# Patient Record
Sex: Female | Born: 1940 | Race: White | Hispanic: No | Marital: Married | Smoking: Never smoker
Health system: Southern US, Community
[De-identification: ages and names within clinical notes are randomized; demographics above are authoritative.]

## PROBLEM LIST (undated history)

## (undated) DIAGNOSIS — E785 Hyperlipidemia, unspecified: Secondary | ICD-10-CM

## (undated) DIAGNOSIS — E7800 Pure hypercholesterolemia, unspecified: Secondary | ICD-10-CM

## (undated) DIAGNOSIS — I5022 Chronic systolic (congestive) heart failure: Secondary | ICD-10-CM

## (undated) DIAGNOSIS — I255 Ischemic cardiomyopathy: Secondary | ICD-10-CM

## (undated) DIAGNOSIS — J45909 Unspecified asthma, uncomplicated: Secondary | ICD-10-CM

## (undated) DIAGNOSIS — E119 Type 2 diabetes mellitus without complications: Secondary | ICD-10-CM

## (undated) DIAGNOSIS — I509 Heart failure, unspecified: Secondary | ICD-10-CM

## (undated) DIAGNOSIS — M1990 Unspecified osteoarthritis, unspecified site: Secondary | ICD-10-CM

## (undated) DIAGNOSIS — N1832 Chronic kidney disease, stage 3b: Secondary | ICD-10-CM

## (undated) DIAGNOSIS — I517 Cardiomegaly: Secondary | ICD-10-CM

## (undated) DIAGNOSIS — I213 ST elevation (STEMI) myocardial infarction of unspecified site: Secondary | ICD-10-CM

## (undated) HISTORY — PX: ABDOMINAL HYSTERECTOMY: SHX81

---

## 2000-08-03 ENCOUNTER — Emergency Department (HOSPITAL_COMMUNITY): Admission: EM | Admit: 2000-08-03 | Discharge: 2000-08-03 | Payer: Self-pay | Admitting: *Deleted

## 2000-08-03 ENCOUNTER — Encounter: Payer: Self-pay | Admitting: *Deleted

## 2002-05-25 ENCOUNTER — Ambulatory Visit (HOSPITAL_COMMUNITY): Admission: RE | Admit: 2002-05-25 | Discharge: 2002-05-25 | Payer: Self-pay | Admitting: Pulmonary Disease

## 2006-07-25 ENCOUNTER — Ambulatory Visit (HOSPITAL_COMMUNITY): Admission: RE | Admit: 2006-07-25 | Discharge: 2006-07-25 | Payer: Self-pay | Admitting: Family Medicine

## 2008-02-02 ENCOUNTER — Ambulatory Visit (HOSPITAL_COMMUNITY): Admission: RE | Admit: 2008-02-02 | Discharge: 2008-02-02 | Payer: Self-pay | Admitting: Family Medicine

## 2008-04-04 ENCOUNTER — Emergency Department (HOSPITAL_COMMUNITY): Admission: EM | Admit: 2008-04-04 | Discharge: 2008-04-04 | Payer: Self-pay | Admitting: Emergency Medicine

## 2010-04-09 ENCOUNTER — Encounter: Payer: Self-pay | Admitting: Family Medicine

## 2013-08-04 ENCOUNTER — Other Ambulatory Visit (HOSPITAL_COMMUNITY): Payer: Self-pay | Admitting: Family Medicine

## 2013-08-04 ENCOUNTER — Ambulatory Visit (HOSPITAL_COMMUNITY)
Admission: RE | Admit: 2013-08-04 | Discharge: 2013-08-04 | Disposition: A | Discharge status: Home / Self Care | Payer: Medicare Other | Source: Ambulatory Visit | Attending: Family Medicine | Admitting: Family Medicine

## 2013-08-04 DIAGNOSIS — Z139 Encounter for screening, unspecified: Secondary | ICD-10-CM

## 2013-08-04 DIAGNOSIS — Z1231 Encounter for screening mammogram for malignant neoplasm of breast: Secondary | ICD-10-CM

## 2014-03-08 ENCOUNTER — Ambulatory Visit (HOSPITAL_COMMUNITY)
Admission: RE | Admit: 2014-03-08 | Discharge: 2014-03-08 | Disposition: A | Discharge status: Home / Self Care | Payer: Medicare Other | Source: Ambulatory Visit | Attending: Family Medicine | Admitting: Family Medicine

## 2014-03-08 ENCOUNTER — Other Ambulatory Visit (HOSPITAL_COMMUNITY): Payer: Self-pay | Admitting: Family Medicine

## 2014-03-08 DIAGNOSIS — J322 Chronic ethmoidal sinusitis: Secondary | ICD-10-CM | POA: Diagnosis not present

## 2014-03-08 DIAGNOSIS — R202 Paresthesia of skin: Principal | ICD-10-CM

## 2014-03-08 DIAGNOSIS — R930 Abnormal findings on diagnostic imaging of skull and head, not elsewhere classified: Secondary | ICD-10-CM | POA: Diagnosis not present

## 2014-03-08 DIAGNOSIS — R200 Anesthesia of skin: Secondary | ICD-10-CM

## 2016-04-02 ENCOUNTER — Ambulatory Visit (HOSPITAL_COMMUNITY)
Admission: RE | Admit: 2016-04-02 | Discharge: 2016-04-02 | Disposition: A | Discharge status: Home / Self Care | Payer: Medicare Other | Source: Ambulatory Visit | Attending: Family Medicine | Admitting: Family Medicine

## 2016-04-02 ENCOUNTER — Other Ambulatory Visit (HOSPITAL_COMMUNITY): Payer: Self-pay | Admitting: Family Medicine

## 2016-04-02 DIAGNOSIS — R52 Pain, unspecified: Secondary | ICD-10-CM

## 2016-04-02 DIAGNOSIS — M47816 Spondylosis without myelopathy or radiculopathy, lumbar region: Secondary | ICD-10-CM

## 2016-04-02 DIAGNOSIS — M4316 Spondylolisthesis, lumbar region: Secondary | ICD-10-CM | POA: Insufficient documentation

## 2016-04-02 DIAGNOSIS — M438X6 Other specified deforming dorsopathies, lumbar region: Secondary | ICD-10-CM | POA: Insufficient documentation

## 2016-04-02 DIAGNOSIS — M5136 Other intervertebral disc degeneration, lumbar region: Secondary | ICD-10-CM | POA: Insufficient documentation

## 2016-04-02 DIAGNOSIS — M545 Low back pain: Secondary | ICD-10-CM | POA: Diagnosis present

## 2018-05-27 ENCOUNTER — Encounter (HOSPITAL_COMMUNITY): Payer: Self-pay

## 2018-05-27 ENCOUNTER — Emergency Department (HOSPITAL_COMMUNITY): Payer: Medicare Other

## 2018-05-27 ENCOUNTER — Other Ambulatory Visit: Payer: Self-pay

## 2018-05-27 ENCOUNTER — Emergency Department (HOSPITAL_COMMUNITY)
Admission: EM | Admit: 2018-05-27 | Discharge: 2018-05-27 | Disposition: A | Discharge status: Home / Self Care | Payer: Medicare Other | Attending: Emergency Medicine | Admitting: Emergency Medicine

## 2018-05-27 DIAGNOSIS — R42 Dizziness and giddiness: Secondary | ICD-10-CM | POA: Insufficient documentation

## 2018-05-27 DIAGNOSIS — Z7984 Long term (current) use of oral hypoglycemic drugs: Secondary | ICD-10-CM | POA: Insufficient documentation

## 2018-05-27 DIAGNOSIS — J45909 Unspecified asthma, uncomplicated: Secondary | ICD-10-CM | POA: Insufficient documentation

## 2018-05-27 DIAGNOSIS — F17220 Nicotine dependence, chewing tobacco, uncomplicated: Secondary | ICD-10-CM | POA: Insufficient documentation

## 2018-05-27 DIAGNOSIS — E119 Type 2 diabetes mellitus without complications: Secondary | ICD-10-CM | POA: Insufficient documentation

## 2018-05-27 HISTORY — DX: Type 2 diabetes mellitus without complications: E11.9

## 2018-05-27 HISTORY — DX: Unspecified osteoarthritis, unspecified site: M19.90

## 2018-05-27 HISTORY — DX: Unspecified asthma, uncomplicated: J45.909

## 2018-05-27 HISTORY — DX: Pure hypercholesterolemia, unspecified: E78.00

## 2018-05-27 LAB — INFLUENZA PANEL BY PCR (TYPE A & B)
INFLAPCR: NEGATIVE
Influenza B By PCR: NEGATIVE

## 2018-05-27 LAB — CBC WITH DIFFERENTIAL/PLATELET
Abs Immature Granulocytes: 0.04 10*3/uL (ref 0.00–0.07)
Basophils Absolute: 0.1 10*3/uL (ref 0.0–0.1)
Basophils Relative: 1 %
EOS PCT: 1 %
Eosinophils Absolute: 0.1 10*3/uL (ref 0.0–0.5)
HEMATOCRIT: 43.5 % (ref 36.0–46.0)
HEMOGLOBIN: 14 g/dL (ref 12.0–15.0)
Immature Granulocytes: 1 %
LYMPHS PCT: 11 %
Lymphs Abs: 1 10*3/uL (ref 0.7–4.0)
MCH: 30.9 pg (ref 26.0–34.0)
MCHC: 32.2 g/dL (ref 30.0–36.0)
MCV: 96 fL (ref 80.0–100.0)
MONOS PCT: 5 %
Monocytes Absolute: 0.4 10*3/uL (ref 0.1–1.0)
Neutro Abs: 7.2 10*3/uL (ref 1.7–7.7)
Neutrophils Relative %: 81 %
Platelets: 214 10*3/uL (ref 150–400)
RBC: 4.53 MIL/uL (ref 3.87–5.11)
RDW: 12.6 % (ref 11.5–15.5)
WBC: 8.8 10*3/uL (ref 4.0–10.5)
nRBC: 0 % (ref 0.0–0.2)

## 2018-05-27 LAB — COMPREHENSIVE METABOLIC PANEL
ALK PHOS: 28 U/L — AB (ref 38–126)
ALT: 15 U/L (ref 0–44)
ANION GAP: 9 (ref 5–15)
AST: 18 U/L (ref 15–41)
Albumin: 3.9 g/dL (ref 3.5–5.0)
BILIRUBIN TOTAL: 0.8 mg/dL (ref 0.3–1.2)
BUN: 12 mg/dL (ref 8–23)
CO2: 24 mmol/L (ref 22–32)
Calcium: 9.2 mg/dL (ref 8.9–10.3)
Chloride: 104 mmol/L (ref 98–111)
Creatinine, Ser: 0.96 mg/dL (ref 0.44–1.00)
GFR calc non Af Amer: 57 mL/min — ABNORMAL LOW (ref >60)
Glucose, Bld: 123 mg/dL — ABNORMAL HIGH (ref 70–99)
Potassium: 4.1 mmol/L (ref 3.5–5.1)
SODIUM: 137 mmol/L (ref 135–145)
Total Protein: 7.2 g/dL (ref 6.5–8.1)

## 2018-05-27 LAB — TROPONIN I: Troponin I: <0.03 ng/mL (ref <0.03)

## 2018-05-27 LAB — D-DIMER, QUANTITATIVE (NOT AT ARMC): D DIMER QUANT: 0.64 ug{FEU}/mL — AB (ref 0.00–0.50)

## 2018-05-27 LAB — URINALYSIS, ROUTINE W REFLEX MICROSCOPIC
BILIRUBIN URINE: NEGATIVE
Glucose, UA: NEGATIVE mg/dL
Hgb urine dipstick: NEGATIVE
KETONES UR: NEGATIVE mg/dL
Leukocytes,Ua: NEGATIVE
Nitrite: NEGATIVE
PH: 5 (ref 5.0–8.0)
Protein, ur: NEGATIVE mg/dL
Specific Gravity, Urine: 1.021 (ref 1.005–1.030)

## 2018-05-27 LAB — CBG MONITORING, ED: Glucose-Capillary: 121 mg/dL — ABNORMAL HIGH (ref 70–99)

## 2018-05-27 MED ORDER — ONDANSETRON HCL 4 MG/2ML IJ SOLN: 4.0000 mg | Freq: Once | INTRAMUSCULAR | Status: DC

## 2018-05-27 MED ORDER — SODIUM CHLORIDE 0.9 % IV BOLUS (SEPSIS): 500.0000 mL | Freq: Once | INTRAVENOUS | Status: DC

## 2018-05-27 NOTE — Discharge Instructions (Addendum)
Drink plenty of fluids and rest and follow-up with your doctor this week for recheck

## 2018-05-27 NOTE — ED Triage Notes (Addendum)
EMS reports at 12noon today pt had sudden onset of numbness all over body, generalized weakness, and n/v.    Pt says she thought she was going to pass out and was too weak to get to a phone for approx 1 1/2 hours.   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  Daughter told ems "something is different around her mouth."  Daughter told ems pt was slurring her speech initially.  Denies any slurred speech now.  EDP at bedside.   No obvious facial droop.  Pt alert and oriented.  Pt had some cp after vomiting.  Denies any vomiting at this time.  Pt also has history of vertigo according to ems.  Pt alert, oriented, and pleasant.

## 2018-05-27 NOTE — ED Provider Notes (Signed)
[facility] EMERGENCY DEPARTMENT Provider Note   CSN: [id number] Arrival date & time: 05/27/18  1514    History   Chief Complaint No chief complaint on file.   HPI Terry Hanson is a 78 y.o. female.     Patient states that she felt dizzy and weak and had some heartburn.  Called her daughter and the daughter stated that she sound like she had slurred speech.  When she arrived in the emergency department she actually stated that she felt much better  The history is provided by the patient. No language interpreter was used.  Weakness  Severity:  Moderate Onset quality:  Sudden Timing:  Intermittent Progression:  Resolved Chronicity:  Recurrent Context: not alcohol use   Relieved by:  Nothing Worsened by:  Nothing Ineffective treatments:  None tried Associated symptoms: no abdominal pain, no chest pain, no cough, no diarrhea, no frequency, no headaches and no seizures     Past Medical History:  Diagnosis Date  . Arthritis   . Asthma   . Diabetes mellitus without complication (HCC)   . Hypercholesterolemia     There are no active problems to display for this patient.   Past Surgical History:  Procedure Laterality Date  . ABDOMINAL HYSTERECTOMY       OB History   No obstetric history on file.      Home Medications    Prior to Admission medications   Medication Sig Start Date End Date Taking? Authorizing Provider  metFORMIN (GLUCOPHAGE) 500 MG tablet Take 500 mg by mouth daily.   Yes [provider]  simvastatin (ZOCOR) 20 MG tablet Take 20 mg by mouth daily.   Yes [provider]    Family History No family history on file.  Social History Social History   Tobacco Use  . Smoking status: Never Smoker  . Smokeless tobacco: Current User    Types: Chew  Substance Use Topics  . Alcohol use: Never    Frequency: Never  . Drug use: Never     Allergies   Codeine; Penicillins; and Sulfa antibiotics   Review of Systems Review of  Systems  Constitutional: Negative for appetite change and fatigue.  HENT: Negative for congestion, ear discharge and sinus pressure.   Eyes: Negative for discharge.  Respiratory: Negative for cough.   Cardiovascular: Negative for chest pain.  Gastrointestinal: Negative for abdominal pain and diarrhea.  Genitourinary: Negative for frequency and hematuria.  Musculoskeletal: Negative for back pain.  Skin: Negative for rash.  Neurological: Positive for weakness. Negative for seizures and headaches.  Psychiatric/Behavioral: Negative for hallucinations.     Physical Exam Updated Vital Signs BP (!) 189/50 (BP Location: Left Arm)   Pulse (!) 58   Temp 98.2 F (36.8 C) (Oral)   Resp 20   Ht 5\' 2"  (1.575 m)   Wt 90.7 kg   SpO2 98%   BMI 36.58 kg/m   Physical Exam Vitals signs and nursing note reviewed.  Constitutional:      Appearance: She is well-developed.  HENT:     Head: Normocephalic.     Nose: Nose normal.  Eyes:     General: No scleral icterus.    Conjunctiva/sclera: Conjunctivae normal.  Neck:     Musculoskeletal: Neck supple.     Thyroid: No thyromegaly.  Cardiovascular:     Rate and Rhythm: Normal rate and regular rhythm.     Heart sounds: No murmur. No friction rub. No gallop.   Pulmonary:  Breath sounds: No stridor. No wheezing or rales.  Chest:     Chest wall: No tenderness.  Abdominal:     General: There is no distension.     Tenderness: There is no abdominal tenderness. There is no rebound.  Musculoskeletal: Normal range of motion.  Lymphadenopathy:     Cervical: No cervical adenopathy.  Skin:    Findings: No erythema or rash.  Neurological:     Mental Status: She is oriented to person, place, and time.     Motor: No abnormal muscle tone.     Coordination: Coordination normal.  Psychiatric:        Behavior: Behavior normal.      ED Treatments / Results  Labs (all labs ordered are listed, but only abnormal results are displayed) Labs  Reviewed  COMPREHENSIVE METABOLIC PANEL - Abnormal; Notable for the following components:      Result Value   Glucose, Bld 123 (*)    Alkaline Phosphatase 28 (*)    GFR calc non Af Amer 57 (*)    All other components within normal limits  D-DIMER, QUANTITATIVE (NOT AT [facility]) - Abnormal; Notable for the following components:   D-Dimer, Quant 0.64 (*)    All other components within normal limits  CBG MONITORING, ED - Abnormal; Notable for the following components:   Glucose-Capillary 121 (*)    All other components within normal limits  CBC WITH DIFFERENTIAL/PLATELET  TROPONIN I  INFLUENZA PANEL BY PCR (TYPE A & B)  URINALYSIS, ROUTINE W REFLEX MICROSCOPIC  TROPONIN I    EKG EKG Interpretation  Date/Time:  Tuesday May 27 2018 17:02:17 EDT Ventricular Rate:  54 PR Interval:    QRS Duration: 98 QT Interval:  449 QTC Calculation: 426 R Axis:   79 Text Interpretation:  Sinus rhythm Baseline wander in lead(s) V4 Confirmed by Bethann Berkshire [id number]) on 05/27/2018 6:50:06 PM   Radiology Dg Chest 2 View  Result Date: 05/27/2018 CLINICAL DATA:  Acute weakness. EXAM: CHEST - 2 VIEW COMPARISON:  None. FINDINGS: Cardiomegaly and elevated RIGHT hemidiaphragm noted. There is no evidence of focal airspace disease, pulmonary edema, suspicious pulmonary nodule/mass, pleural effusion, or pneumothorax. No acute bony abnormalities are identified. IMPRESSION: Cardiomegaly without acute abnormality. Elevated RIGHT hemidiaphragm-likely chronic. Electronically Signed   By: Harmon Pier M.D.   On: 05/27/2018 16:24   Ct Head Wo Contrast  Result Date: 05/27/2018 CLINICAL DATA:  78 year old female with sudden onset weakness and numbness at noon. EXAM: CT HEAD WITHOUT CONTRAST TECHNIQUE: Contiguous axial images were obtained from the base of the skull through the vertex without intravenous contrast. COMPARISON:  Head CT 03/08/2014. FINDINGS: Brain: Stable cerebral volume, normal for age. No midline shift,  ventriculomegaly, mass effect, evidence of mass lesion, intracranial hemorrhage or evidence of cortically based acute infarction. Gray-white matter differentiation is within normal limits throughout the brain. Vascular: Calcified atherosclerosis at the skull base. No suspicious intracranial vascular hyperdensity. Skull: Negative. Sinuses/Orbits: Clear aside from mild bubbly opacity in the left sphenoid today. Tympanic cavities and mastoids remain clear. Other: Negative orbit and scalp soft tissues. IMPRESSION: Stable and normal for age non contrast CT appearance of the brain. Electronically Signed   By: Odessa Fleming M.D.   On: 05/27/2018 18:44    Procedures Procedures (including critical care time)  Medications Ordered in ED Medications  ondansetron (ZOFRAN) injection 4 mg (4 mg Intravenous Not Given 05/27/18 1822)  sodium chloride 0.9 % bolus 500 mL (500 mLs Intravenous Refused 05/27/18 1821)  Initial Impression / Assessment and Plan / ED Course  I have reviewed the triage vital signs and the nursing notes.  Pertinent labs & imaging results that were available during my care of the patient were reviewed by me and considered in my medical decision making (see chart for details).        Dizziness and weakness resolved.  Labs and x-rays unremarkable.  Patient prefers to be discharged home.  Patient's weakness may have been related to mild dehydration she will follow-up with her PCP this week or return if problems  Final Clinical Impressions(s) / ED Diagnoses   Final diagnoses:  Dizziness    ED Discharge Orders    None       Bethann Berkshire, MD 05/27/18 2042

## 2019-06-29 ENCOUNTER — Inpatient Hospital Stay (HOSPITAL_COMMUNITY)
Admission: EM | Disposition: A | Discharge status: Home / Self Care | Payer: Self-pay | Source: Home / Self Care | Attending: Interventional Cardiology

## 2019-06-29 ENCOUNTER — Emergency Department (HOSPITAL_COMMUNITY): Admit: 2019-06-29 | Payer: Medicare Other | Admitting: Interventional Cardiology

## 2019-06-29 ENCOUNTER — Inpatient Hospital Stay (HOSPITAL_COMMUNITY): Payer: Medicare Other

## 2019-06-29 ENCOUNTER — Inpatient Hospital Stay (HOSPITAL_COMMUNITY)
Admission: EM | Admit: 2019-06-29 | Discharge: 2019-07-02 | DRG: 246 | Disposition: A | Discharge status: Home / Self Care | Payer: Medicare Other | Attending: Interventional Cardiology | Admitting: Interventional Cardiology

## 2019-06-29 ENCOUNTER — Encounter (HOSPITAL_COMMUNITY): Payer: Self-pay

## 2019-06-29 ENCOUNTER — Encounter (HOSPITAL_COMMUNITY): Payer: Self-pay | Admitting: Physician Assistant

## 2019-06-29 DIAGNOSIS — Z885 Allergy status to narcotic agent status: Secondary | ICD-10-CM | POA: Diagnosis not present

## 2019-06-29 DIAGNOSIS — Z955 Presence of coronary angioplasty implant and graft: Secondary | ICD-10-CM

## 2019-06-29 DIAGNOSIS — I2102 ST elevation (STEMI) myocardial infarction involving left anterior descending coronary artery: Secondary | ICD-10-CM | POA: Diagnosis present

## 2019-06-29 DIAGNOSIS — I5021 Acute systolic (congestive) heart failure: Secondary | ICD-10-CM | POA: Diagnosis present

## 2019-06-29 DIAGNOSIS — Z79899 Other long term (current) drug therapy: Secondary | ICD-10-CM | POA: Diagnosis not present

## 2019-06-29 DIAGNOSIS — I2510 Atherosclerotic heart disease of native coronary artery without angina pectoris: Secondary | ICD-10-CM

## 2019-06-29 DIAGNOSIS — E119 Type 2 diabetes mellitus without complications: Secondary | ICD-10-CM | POA: Diagnosis present

## 2019-06-29 DIAGNOSIS — E782 Mixed hyperlipidemia: Secondary | ICD-10-CM | POA: Diagnosis not present

## 2019-06-29 DIAGNOSIS — Z7984 Long term (current) use of oral hypoglycemic drugs: Secondary | ICD-10-CM

## 2019-06-29 DIAGNOSIS — Z20822 Contact with and (suspected) exposure to covid-19: Secondary | ICD-10-CM | POA: Diagnosis present

## 2019-06-29 DIAGNOSIS — I5023 Acute on chronic systolic (congestive) heart failure: Secondary | ICD-10-CM | POA: Diagnosis not present

## 2019-06-29 DIAGNOSIS — E785 Hyperlipidemia, unspecified: Secondary | ICD-10-CM | POA: Diagnosis present

## 2019-06-29 DIAGNOSIS — R079 Chest pain, unspecified: Secondary | ICD-10-CM | POA: Diagnosis present

## 2019-06-29 DIAGNOSIS — Z882 Allergy status to sulfonamides status: Secondary | ICD-10-CM | POA: Diagnosis not present

## 2019-06-29 DIAGNOSIS — Z8249 Family history of ischemic heart disease and other diseases of the circulatory system: Secondary | ICD-10-CM | POA: Diagnosis not present

## 2019-06-29 DIAGNOSIS — I213 ST elevation (STEMI) myocardial infarction of unspecified site: Secondary | ICD-10-CM

## 2019-06-29 DIAGNOSIS — R030 Elevated blood-pressure reading, without diagnosis of hypertension: Secondary | ICD-10-CM | POA: Diagnosis present

## 2019-06-29 DIAGNOSIS — E1169 Type 2 diabetes mellitus with other specified complication: Secondary | ICD-10-CM

## 2019-06-29 DIAGNOSIS — Z880 Allergy status to penicillin: Secondary | ICD-10-CM

## 2019-06-29 DIAGNOSIS — I10 Essential (primary) hypertension: Secondary | ICD-10-CM | POA: Diagnosis not present

## 2019-06-29 DIAGNOSIS — I472 Ventricular tachycardia: Secondary | ICD-10-CM | POA: Diagnosis not present

## 2019-06-29 DIAGNOSIS — I255 Ischemic cardiomyopathy: Secondary | ICD-10-CM | POA: Diagnosis present

## 2019-06-29 HISTORY — PX: CORONARY/GRAFT ACUTE MI REVASCULARIZATION: CATH118305

## 2019-06-29 HISTORY — PX: LEFT HEART CATH AND CORONARY ANGIOGRAPHY: CATH118249

## 2019-06-29 HISTORY — DX: Type 2 diabetes mellitus without complications: E11.9

## 2019-06-29 HISTORY — DX: Hyperlipidemia, unspecified: E78.5

## 2019-06-29 HISTORY — DX: Cardiomegaly: I51.7

## 2019-06-29 LAB — COMPREHENSIVE METABOLIC PANEL
ALT: 20 U/L (ref 0–44)
AST: 25 U/L (ref 15–41)
Albumin: 3.9 g/dL (ref 3.5–5.0)
Alkaline Phosphatase: 29 U/L — ABNORMAL LOW (ref 38–126)
Anion gap: 15 (ref 5–15)
BUN: 14 mg/dL (ref 8–23)
CO2: 24 mmol/L (ref 22–32)
Calcium: 9.9 mg/dL (ref 8.9–10.3)
Chloride: 100 mmol/L (ref 98–111)
Creatinine, Ser: 1.34 mg/dL — ABNORMAL HIGH (ref 0.44–1.00)
GFR calc Af Amer: 44 mL/min — ABNORMAL LOW (ref >60)
GFR calc non Af Amer: 38 mL/min — ABNORMAL LOW (ref >60)
Glucose, Bld: 264 mg/dL — ABNORMAL HIGH (ref 70–99)
Potassium: 3.8 mmol/L (ref 3.5–5.1)
Sodium: 139 mmol/L (ref 135–145)
Total Bilirubin: 0.7 mg/dL (ref 0.3–1.2)
Total Protein: 7.5 g/dL (ref 6.5–8.1)

## 2019-06-29 LAB — CBC WITH DIFFERENTIAL/PLATELET
Abs Immature Granulocytes: 0.09 10*3/uL — ABNORMAL HIGH (ref 0.00–0.07)
Basophils Absolute: 0.1 10*3/uL (ref 0.0–0.1)
Basophils Relative: 1 %
Eosinophils Absolute: 0.1 10*3/uL (ref 0.0–0.5)
Eosinophils Relative: 1 %
HCT: 44.6 % (ref 36.0–46.0)
Hemoglobin: 14.4 g/dL (ref 12.0–15.0)
Immature Granulocytes: 1 %
Lymphocytes Relative: 15 %
Lymphs Abs: 2.1 10*3/uL (ref 0.7–4.0)
MCH: 30.6 pg (ref 26.0–34.0)
MCHC: 32.3 g/dL (ref 30.0–36.0)
MCV: 94.7 fL (ref 80.0–100.0)
Monocytes Absolute: 0.7 10*3/uL (ref 0.1–1.0)
Monocytes Relative: 5 %
Neutro Abs: 11.2 10*3/uL — ABNORMAL HIGH (ref 1.7–7.7)
Neutrophils Relative %: 77 %
Platelets: 249 10*3/uL (ref 150–400)
RBC: 4.71 MIL/uL (ref 3.87–5.11)
RDW: 13 % (ref 11.5–15.5)
WBC: 14.3 10*3/uL — ABNORMAL HIGH (ref 4.0–10.5)
nRBC: 0 % (ref 0.0–0.2)

## 2019-06-29 LAB — RESPIRATORY PANEL BY RT PCR (FLU A&B, COVID)
Influenza A by PCR: NEGATIVE
Influenza B by PCR: NEGATIVE
SARS Coronavirus 2 by RT PCR: NEGATIVE

## 2019-06-29 LAB — LIPID PANEL
Cholesterol: 262 mg/dL — ABNORMAL HIGH (ref 0–200)
Cholesterol: 266 mg/dL — ABNORMAL HIGH (ref 0–200)
HDL: 44 mg/dL (ref >40)
HDL: 51 mg/dL (ref >40)
LDL Cholesterol: 184 mg/dL — ABNORMAL HIGH (ref 0–99)
LDL Cholesterol: 192 mg/dL — ABNORMAL HIGH (ref 0–99)
Total CHOL/HDL Ratio: 6 RATIO
Total CHOL/HDL Ratio: 5.2 RATIO
Triglycerides: 171 mg/dL — ABNORMAL HIGH (ref <150)
Triglycerides: 117 mg/dL (ref <150)
VLDL: 34 mg/dL (ref 0–40)
VLDL: 23 mg/dL (ref 0–40)

## 2019-06-29 LAB — TROPONIN I (HIGH SENSITIVITY)
Troponin I (High Sensitivity): 454 ng/L (ref <18)
Troponin I (High Sensitivity): >27000 ng/L (ref <18)

## 2019-06-29 LAB — PROTIME-INR
INR: 1 (ref 0.8–1.2)
Prothrombin Time: 12.9 seconds (ref 11.4–15.2)

## 2019-06-29 LAB — APTT: aPTT: 25 seconds (ref 24–36)

## 2019-06-29 LAB — TSH: TSH: 2.728 u[IU]/mL (ref 0.350–4.500)

## 2019-06-29 LAB — BRAIN NATRIURETIC PEPTIDE: B Natriuretic Peptide: 37.2 pg/mL (ref 0.0–100.0)

## 2019-06-29 SURGERY — CORONARY/GRAFT ACUTE MI REVASCULARIZATION
Anesthesia: LOCAL

## 2019-06-29 SURGERY — INVASIVE LAB ABORTED CASE

## 2019-06-29 MED ORDER — LABETALOL HCL 5 MG/ML IV SOLN: 10.0000 mg | INTRAVENOUS | Status: AC | PRN

## 2019-06-29 MED ORDER — FUROSEMIDE 10 MG/ML IJ SOLN
40.0000 mg | Freq: Once | INTRAMUSCULAR | Status: AC
  Administered 2019-06-29: 23:00:00 40 mg via INTRAVENOUS
  Filled 2019-06-29: qty 4

## 2019-06-29 MED ORDER — NITROGLYCERIN 1 MG/10 ML FOR IR/CATH LAB
INTRA_ARTERIAL | Status: DC | PRN
  Administered 2019-06-29: 100 ug

## 2019-06-29 MED ORDER — VERAPAMIL HCL 2.5 MG/ML IV SOLN
INTRAVENOUS | Status: DC | PRN
  Administered 2019-06-29 (×2): 100 ug via INTRACORONARY

## 2019-06-29 MED ORDER — HEPARIN (PORCINE) IN NACL 1000-0.9 UT/500ML-% IV SOLN
INTRAVENOUS | Status: AC
  Filled 2019-06-29: qty 1000

## 2019-06-29 MED ORDER — HYDRALAZINE HCL 20 MG/ML IJ SOLN: 10.0000 mg | INTRAMUSCULAR | Status: AC | PRN

## 2019-06-29 MED ORDER — FENTANYL CITRATE (PF) 100 MCG/2ML IJ SOLN
INTRAMUSCULAR | Status: DC | PRN
  Administered 2019-06-29 (×2): 25 ug via INTRAVENOUS
  Administered 2019-06-29: 50 ug via INTRAVENOUS

## 2019-06-29 MED ORDER — HEPARIN SODIUM (PORCINE) 5000 UNIT/ML IJ SOLN
5000.0000 [IU] | Freq: Three times a day (TID) | INTRAMUSCULAR | Status: DC
  Administered 2019-06-30 – 2019-07-02 (×8): 5000 [IU] via SUBCUTANEOUS
  Filled 2019-06-29 (×8): qty 1

## 2019-06-29 MED ORDER — FUROSEMIDE 10 MG/ML IJ SOLN
INTRAMUSCULAR | Status: AC
  Filled 2019-06-29: qty 4

## 2019-06-29 MED ORDER — HEPARIN SODIUM (PORCINE) 1000 UNIT/ML IJ SOLN
INTRAMUSCULAR | Status: AC
  Filled 2019-06-29: qty 1

## 2019-06-29 MED ORDER — FENTANYL CITRATE (PF) 100 MCG/2ML IJ SOLN
INTRAMUSCULAR | Status: AC
  Filled 2019-06-29: qty 2

## 2019-06-29 MED ORDER — LIDOCAINE HCL (PF) 1 % IJ SOLN
INTRAMUSCULAR | Status: AC
  Filled 2019-06-29: qty 30

## 2019-06-29 MED ORDER — HEPARIN SODIUM (PORCINE) 1000 UNIT/ML IJ SOLN
INTRAMUSCULAR | Status: DC | PRN
  Administered 2019-06-29: 10000 [IU] via INTRAVENOUS
  Administered 2019-06-29: 3000 [IU] via INTRAVENOUS

## 2019-06-29 MED ORDER — TIROFIBAN HCL IN NACL 5-0.9 MG/100ML-% IV SOLN
INTRAVENOUS | Status: AC
  Filled 2019-06-29: qty 100

## 2019-06-29 MED ORDER — TICAGRELOR 90 MG PO TABS
90.0000 mg | ORAL_TABLET | Freq: Two times a day (BID) | ORAL | Status: DC
  Administered 2019-06-30 – 2019-07-02 (×5): 90 mg via ORAL
  Filled 2019-06-29 (×5): qty 1

## 2019-06-29 MED ORDER — LIDOCAINE HCL (PF) 1 % IJ SOLN
INTRAMUSCULAR | Status: DC | PRN
  Administered 2019-06-29: 30 mL

## 2019-06-29 MED ORDER — NITROGLYCERIN IN D5W 200-5 MCG/ML-% IV SOLN
INTRAVENOUS | Status: AC | PRN
  Administered 2019-06-29: 10 ug/min via INTRAVENOUS

## 2019-06-29 MED ORDER — VERAPAMIL HCL 2.5 MG/ML IV SOLN
INTRAVENOUS | Status: AC
  Filled 2019-06-29: qty 2

## 2019-06-29 MED ORDER — OXYCODONE HCL 5 MG PO TABS: 5.0000 mg | ORAL_TABLET | ORAL | Status: DC | PRN

## 2019-06-29 MED ORDER — ONDANSETRON HCL 4 MG/2ML IJ SOLN: 4.0000 mg | Freq: Four times a day (QID) | INTRAMUSCULAR | Status: DC | PRN

## 2019-06-29 MED ORDER — NITROGLYCERIN 1 MG/10 ML FOR IR/CATH LAB
INTRA_ARTERIAL | Status: AC
  Filled 2019-06-29: qty 10

## 2019-06-29 MED ORDER — HEPARIN SODIUM (PORCINE) 5000 UNIT/ML IJ SOLN: 4000.0000 [IU] | Freq: Once | INTRAMUSCULAR | Status: DC

## 2019-06-29 MED ORDER — TICAGRELOR 90 MG PO TABS
ORAL_TABLET | ORAL | Status: DC | PRN
  Administered 2019-06-29: 180 mg via ORAL

## 2019-06-29 MED ORDER — VERAPAMIL HCL 2.5 MG/ML IV SOLN
INTRAVENOUS | Status: DC | PRN
  Administered 2019-06-29: 10 mL via INTRA_ARTERIAL

## 2019-06-29 MED ORDER — SODIUM CHLORIDE 0.9% FLUSH: 3.0000 mL | INTRAVENOUS | Status: DC | PRN

## 2019-06-29 MED ORDER — TICAGRELOR 90 MG PO TABS
ORAL_TABLET | ORAL | Status: AC
  Filled 2019-06-29: qty 2

## 2019-06-29 MED ORDER — ACETAMINOPHEN 325 MG PO TABS: 650.0000 mg | ORAL_TABLET | ORAL | Status: DC | PRN

## 2019-06-29 MED ORDER — FENTANYL CITRATE (PF) 100 MCG/2ML IJ SOLN
50.0000 ug | INTRAMUSCULAR | Status: AC | PRN
  Administered 2019-06-29: 50 ug via INTRAVENOUS
  Filled 2019-06-29: qty 2

## 2019-06-29 MED ORDER — HEPARIN (PORCINE) IN NACL 1000-0.9 UT/500ML-% IV SOLN
INTRAVENOUS | Status: DC | PRN
  Administered 2019-06-29 (×2): 500 mL

## 2019-06-29 MED ORDER — HEPARIN SODIUM (PORCINE) 5000 UNIT/ML IJ SOLN: 5000.0000 [IU] | Freq: Three times a day (TID) | INTRAMUSCULAR | Status: DC

## 2019-06-29 MED ORDER — ASPIRIN 81 MG PO CHEW
81.0000 mg | CHEWABLE_TABLET | Freq: Every day | ORAL | Status: DC
  Administered 2019-06-30 – 2019-07-02 (×3): 81 mg via ORAL
  Filled 2019-06-29 (×3): qty 1

## 2019-06-29 MED ORDER — IOHEXOL 350 MG/ML SOLN
INTRAVENOUS | Status: AC
  Filled 2019-06-29: qty 1

## 2019-06-29 MED ORDER — MIDAZOLAM HCL 2 MG/2ML IJ SOLN
INTRAMUSCULAR | Status: AC
  Filled 2019-06-29: qty 2

## 2019-06-29 MED ORDER — MIDAZOLAM HCL 2 MG/2ML IJ SOLN
INTRAMUSCULAR | Status: DC | PRN
  Administered 2019-06-29: 0.5 mg via INTRAVENOUS

## 2019-06-29 MED ORDER — TIROFIBAN (AGGRASTAT) BOLUS VIA INFUSION
INTRAVENOUS | Status: DC | PRN
  Administered 2019-06-29: 2040 ug via INTRAVENOUS

## 2019-06-29 MED ORDER — NITROGLYCERIN IN D5W 200-5 MCG/ML-% IV SOLN: 10.0000 ug/min | INTRAVENOUS | Status: DC

## 2019-06-29 MED ORDER — FUROSEMIDE 10 MG/ML IJ SOLN
INTRAMUSCULAR | Status: DC | PRN
  Administered 2019-06-29: 40 mg via INTRAVENOUS
  Administered 2019-06-29: 20 mg via INTRAVENOUS

## 2019-06-29 MED ORDER — SODIUM CHLORIDE 0.9 % IV SOLN: INTRAVENOUS | Status: DC

## 2019-06-29 MED ORDER — SODIUM CHLORIDE 0.9 % IV SOLN: 250.0000 mL | INTRAVENOUS | Status: DC | PRN

## 2019-06-29 MED ORDER — IOHEXOL 350 MG/ML SOLN
INTRAVENOUS | Status: DC | PRN
  Administered 2019-06-29: 130 mL

## 2019-06-29 MED ORDER — SODIUM CHLORIDE 0.9% FLUSH
3.0000 mL | Freq: Two times a day (BID) | INTRAVENOUS | Status: DC
  Administered 2019-06-30 – 2019-07-02 (×4): 3 mL via INTRAVENOUS

## 2019-06-29 SURGICAL SUPPLY — 19 items
BALLN SAPPHIRE 2.5X15 (BALLOONS) ×2
BALLN SAPPHIRE ~~LOC~~ 3.0X18 (BALLOONS) ×1 IMPLANT
BALLOON SAPPHIRE 2.5X15 (BALLOONS) IMPLANT
CATH 5FR JL3.5 JR4 ANG PIG MP (CATHETERS) ×1 IMPLANT
CATH VISTA GUIDE 6FR XBLAD3.0 (CATHETERS) ×1 IMPLANT
DEVICE RAD COMP TR BAND LRG (VASCULAR PRODUCTS) ×1 IMPLANT
GLIDESHEATH SLEND A-KIT 6F 22G (SHEATH) ×1 IMPLANT
GUIDEWIRE INQWIRE 1.5J.035X260 (WIRE) IMPLANT
INQWIRE 1.5J .035X260CM (WIRE) ×2
KIT ENCORE 26 ADVANTAGE (KITS) ×1 IMPLANT
KIT HEART LEFT (KITS) ×2 IMPLANT
PACK CARDIAC CATHETERIZATION (CUSTOM PROCEDURE TRAY) ×2 IMPLANT
SHEATH PINNACLE 6F 10CM (SHEATH) ×1 IMPLANT
STENT RESOLUTE ONYX 2.5X34 (Permanent Stent) ×1 IMPLANT
TRANSDUCER W/STOPCOCK (MISCELLANEOUS) ×2 IMPLANT
TUBING CIL FLEX 10 FLL-RA (TUBING) ×2 IMPLANT
WIRE ASAHI PROWATER 180CM (WIRE) ×1 IMPLANT
WIRE EMERALD 3MM-J .035X150CM (WIRE) ×1 IMPLANT
WIRE HI TORQ VERSACORE-J 145CM (WIRE) ×1 IMPLANT

## 2019-06-29 NOTE — ED Provider Notes (Signed)
[facility] EMERGENCY DEPARTMENT Provider Note   CSN: [id number] Arrival date & time: 06/29/19  1947     History Chief Complaint  Patient presents with  . Code STEMI    Terry Hanson is a 79 y.o. female.  Presents to ER with chief complaint chest pain.  Code STEMI.  Level 5 history caveat history limited due to acuity.  This evening had severe left-sided chest pain radiated down left arm, back.  Up to 10 out of 10 in severity.  Not improved with nitroglycerin.  Not associated with shortness of breath.  Occurring at rest.  Per EMS report, received nitroglycerin, aspirin.  Patient reports history of "enlarged heart" but has not seen cardiologist previously, no known history of coronary artery disease.  Denies prior history DVT/PE, not on anticoagulation.  HPI     No past medical history on file.  There are no problems to display for this patient.   OB History   No obstetric history on file.     No family history on file.  Social History   Tobacco Use  . Smoking status: Not on file  Substance Use Topics  . Alcohol use: Not on file  . Drug use: Not on file    Home Medications Prior to Admission medications   Not on File    Allergies    Codeine and Sulfa antibiotics  Review of Systems   Review of Systems  Unable to perform ROS: Acuity of condition  Cardiovascular: Positive for chest pain.    Physical Exam Updated Vital Signs There were no vitals taken for this visit.  Physical Exam Vitals and nursing note reviewed.  Constitutional:      General: She is not in acute distress.    Appearance: She is well-developed.     Comments: Appears to be in obvious pain, discomfort but no distress  HENT:     Head: Normocephalic and atraumatic.  Eyes:     Conjunctiva/sclera: Conjunctivae normal.  Cardiovascular:     Rate and Rhythm: Regular rhythm. Tachycardia present.     Heart sounds: No murmur.     Comments: Pulses equal in all 4  extremities Pulmonary:     Effort: Pulmonary effort is normal. No respiratory distress.     Breath sounds: Normal breath sounds.  Abdominal:     Palpations: Abdomen is soft.     Tenderness: There is no abdominal tenderness.  Musculoskeletal:        General: No deformity or signs of injury.     Cervical back: Neck supple.  Skin:    General: Skin is warm and dry.  Neurological:     General: No focal deficit present.     Mental Status: She is alert and oriented to person, place, and time.  Psychiatric:        Mood and Affect: Mood normal.        Behavior: Behavior normal.     ED Results / Procedures / Treatments   Labs (all labs ordered are listed, but only abnormal results are displayed) Labs Reviewed  RESPIRATORY PANEL BY RT PCR (FLU A&B, COVID)  HEMOGLOBIN A1C  CBC WITH DIFFERENTIAL/PLATELET  PROTIME-INR  APTT  COMPREHENSIVE METABOLIC PANEL  LIPID PANEL  TROPONIN I (HIGH SENSITIVITY)    EKG None  Radiology No results found.  Procedures Procedures (including critical care time)  Medications Ordered in ED Medications  fentaNYL (SUBLIMAZE) 100 MCG/2ML injection (has no administration in time range)  0.9 %  sodium  chloride infusion (has no administration in time range)  heparin injection 4,000 Units (has no administration in time range)    ED Course  I have reviewed the triage vital signs and the nursing notes.  Pertinent labs & imaging results that were available during my care of the patient were reviewed by me and considered in my medical decision making (see chart for details).  Clinical Course as of Jun 28 2004  Mon Jun 29, 2019  0076 Present on pt arrival, obvious chest pain, repeat EKG confirm MI, cards PA at bedside on arrival as well   [RD]  2000 To cath lab   [RD]    Clinical Course User Index [RD] Lucrezia Starch, MD   MDM Rules/Calculators/A&P                      Presents to ER as code STEMI.  On arrival here vital signs stable, patient  in obvious pain.  Cardiology at bedside.  EKG here confirms STEMI.  Taken emergently to Cath Lab as soon as interventionalist and their team ready. Received ASA prior to arrival. In ER gave initial heparin bolus, fentanyl for pain.   Final Clinical Impression(s) / ED Diagnoses Final diagnoses:  Acute ST elevation myocardial infarction (STEMI), unspecified artery [facility])    Rx / DC Orders ED Discharge Orders    None       Lucrezia Starch, MD 06/29/19 2008

## 2019-06-29 NOTE — ED Notes (Signed)
Patient taken to Cath lab with RN, MD and Tech.

## 2019-06-29 NOTE — H&P (Addendum)
The patient has been seen in conjunction with [name], PAC. All aspects of care have been considered and discussed. The patient has been personally interviewed, examined, and all clinical data has been reviewed.  The patient was transported to the [facility] Emergency room by [facility].  An EKG was encoded and demonstrated ST elevation in V1 and V2. Upon arrival the patient was stopped in the emergency department for COVID-19 nasal swab.  EKG was repeated.  Significant ventricular ectopy was noted, the patient was noted to have ongoing pain, and had the look of 1 who is critically ill. Chest discomfort started around 5 PM.  She began having worsening shortness of breath starting approximately an hour ago.  She was also complaining of arm and back discomfort. Skin is cool and clammy.  The patient is very anxious.  Respiratory rate is above 20 breaths/min.  No wheezing is heard on lung auscultation.  Crackles are heard at the base.  Cardiac exam reveals no significant murmurs or gallop.  Radial and dorsalis pedis pulses are 2+ and symmetric bilaterally.  The neurological exam is grossly intact. The electrocardiogram reveals right bundle with marked ST elevation V1 through V3.  Significant ventricular ectopy is also noted. 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  Impending acute systolic heart failure. RECOMMENDATION: High flow O2; emergency coronary angiography with mechanical reperfusion; respiratory status is tenuous and may require intubation or at the very least BiPAP.  Initial plan is to perform procedure from radial access.  No family is available.  Emergency consent was obtained from the patient.  The procedure is described in broad detail that the patient seemed to understand.    CRITICAL CARE Time: 35 minutes   Cardiology Admission History and Physical:   Patient ID: Terry Hanson MRN: [id number]; DOB: 1940-11-28   Admission date: 06/29/2019  Primary Care  Provider: Lucia Gaskins, MD Primary Cardiologist: No primary care provider on file. - new to [employer], lives in [place] Primary Electrophysiologist:  None   Chief Complaint:  chest pain  Patient Profile:   Terry Hanson is a 79 y.o. female with h/o DM, HLD, enlarged heart who presented to [facility] with chest pain and concern for acute MI.  History of Present Illness:   Ms. Kush reports a prior history of enlarged heart but denies any history of CAD or MI. She was in her USOH today when she developed severe substernal chest pain while at home just going about her normal day. This was associated with nausea, SOB and left arm pain. Symptoms were going on about 90 minutes before she reached out to EMS. EKG was abnormal with changes in V1-V2 concerning for anterior MI. Initial tracing was read as atrial fibrillation but there were clear P waves on the EKG. Code STEMI was called and she was transported emergently to the ED. She was given 342m ASA and 3 SL NTG prior to arrival. Their initial VS showed BP 185/81 and P100, pulse ox 96%. Most recent VS with BP 156/121, pulse 95, Pulse ox 96%. She is still complaining of significant pain and states she's never felt like this before. She got 540m of fentanyl in the ED. Denies hx of bleeding, stroke, TIA. She is allergic to PCN, Sulfa drugs and codeine. She recalls being allergic to something else but she doesn't recall what it is - she denies ever being allergic to contrast dye. She denies any fever, chills, syncope, palpitations or sick contacts. She got her  J&J vaccine 3 weeks ago. Stat labs and Covid swab sent in ED.  Of note she was initially listed as a "[name]" in the ED before chart was merged to this one. She also has an old chart MRN [id number] with an ED visit from 05/2018 with dizziness of unclear cause, able to discharged from the ED. CBC and Cr were normal at that time and EKG had shown sinus bradycardia.   Past Medical  History:  Diagnosis Date   Diabetes mellitus ([place])    Enlarged heart    Hyperlipidemia     History reviewed. No pertinent surgical history.   Medications Prior to Admission: Prior to Admission medications   Not on File     Allergies:    Allergies  Allergen Reactions   Codeine    Penicillins    Sulfa Antibiotics     Social History:   Social History   Socioeconomic History   Marital status: Married    Spouse name: Not on file   Number of children: Not on file   Years of education: Not on file   Highest education level: Not on file  Occupational History   Not on file  Tobacco Use   Smoking status: Never Smoker   Smokeless tobacco: Never Used  Substance and Sexual Activity   Alcohol use: Not Currently   Drug use: Never   Sexual activity: Not on file  Other Topics Concern   Not on file  Social History Narrative   Not on file   Social Determinants of Health   Financial Resource Strain:    Difficulty of Paying Living Expenses:   Food Insecurity:    Worried About Charity fundraiser in the Last Year:    Arboriculturist in the Last Year:   Transportation Needs:    Film/video editor (Medical):    Lack of Transportation (Non-Medical):   Physical Activity:    Days of Exercise per Week:    Minutes of Exercise per Session:   Stress:    Feeling of Stress :   Social Connections:    Frequency of Communication with Friends and Family:    Frequency of Social Gatherings with Friends and Family:    Attends Religious Services:    Active Member of Clubs or Organizations:    Attends Music therapist:    Marital Status:   Intimate Partner Violence:    Fear of Current or Ex-Partner:    Emotionally Abused:    Physically Abused:    Sexually Abused:     Family History:   The patient's family history includes Heart disease in her mother.    ROS:  Please see the history of present illness.  All other ROS reviewed and negative.     Physical Exam/Data:    Vital Signs. BP (!) 160/110 (BP Location: Right Arm)   Pulse (!) 110   Resp 20   Ht _0  (1.651 m)   Wt 81.6 kg   SpO2 100%   BMI 29.95 kg/m  General: Well developed, well nourished uncomfortable appearing WF Head: Normocephalic, atraumatic, sclera non-icteric, no xanthomas, nares are without discharge. Neck: Negative for carotid bruits. JVP not elevated. Lungs: Clear bilaterally to auscultation without wheezes, rales, or rhonchi. Breathing is unlabored. Heart: Irregular rhythm, appears NSR on monitor with frequent PACs, S1 S2 without murmurs, rubs, or gallops.  Abdomen: Soft, non-tender, non-distended with normoactive bowel sounds. No rebound/guarding. Extremities: No clubbing or cyanosis. No edema. Distal pedal pulses  are 2+ and equal bilaterally. Neuro: Alert and oriented X 3. Moves all extremities spontaneously. Psych:  Responds to questions appropriately with a normal affect.  EKG:  The ECG that was done by EMS shows an irregular rhythm but appears to have P Waves in V4-V5, suspect NSR with frequent ectopy, probable ST elevation V1-V3 but with Q waves present  F/u EKG in ED showed sinus tach with occasional PVCs (P waves again seen most clearly V4-V6), IVCD with ST elevation V1-V2, difficult to quantify height given morphology  Relevant CV Studies: N/a  Laboratory Data:  High Sensitivity Troponin:  No results for input(s): TROPONINIHS in the last 720 hours.    ChemistryNo results for input(s): NA, K, CL, CO2, GLUCOSE, BUN, CREATININE, CALCIUM, GFRNONAA, GFRAA, ANIONGAP in the last 168 hours.  No results for input(s): PROT, ALBUMIN, AST, ALT, ALKPHOS, BILITOT in the last 168 hours. Hematology Recent Labs  Lab 06/29/19 1953  WBC 14.3*  RBC 4.71  HGB 14.4  HCT 44.6  MCV 94.7  MCH 30.6  MCHC 32.3  RDW 13.0  PLT 249   BNPNo results for input(s): BNP, PROBNP in the last 168 hours.  DDimer No results for input(s): DDIMER in the last 168 hours.   Radiology/Studies:    No results found.     TIMI Risk Score for ST  Elevation MI:   The patient's TIMI risk score is 6, which indicates a 16.1% risk of all cause mortality at 30 days.    Assessment and Plan:   1. Chest pain and suspected anterior myocardial infarction 2. Irregular rhythm, noted to be in NSR with PACs on arrival to ED (P waves best seen V3-V6) 3. Elevated BP without prior diagnosis of HTN 4. Hyperlipidemia 5. H/o enlarged heart  Patient met acutely in ED and seen with Dr. Pernell Dupre. She will undergo urgent coronary angiography to help dictate plan. Per our discussion, Dr. Tamala Julian will plan to enter orders based on findings and confer with cardiology fellow if needed. I will also sign out to cardiology fellow to be aware home med rec is pending at this time - will need to re-order as appropriate. Labs still pending at this time.   Severity of Illness: The appropriate patient status for this patient is INPATIENT. Inpatient status is judged to be reasonable and necessary in order to provide the required intensity of service to ensure the patient's safety. The patient's presenting symptoms, physical exam findings, and initial radiographic and laboratory data in the context of their chronic comorbidities is felt to place them at high risk for further clinical deterioration. Furthermore, it is not anticipated that the patient will be medically stable for discharge from the hospital within 2 midnights of admission. The following factors support the patient status of inpatient.   " The patient's presenting symptoms include severe chest pain, dyspnea. " The worrisome physical exam findings include uncomfortable/ writhing, elevated BP. " The initial radiographic and laboratory data are worrisome because of abnormal EKG suggestive of anterior MI. " The chronic co-morbidities include DM, "enlarged heart," HLD.   * I certify that at the point of admission it is my clinical judgment that the patient will  require inpatient hospital care spanning beyond 2 midnights from the point of admission due to high intensity of service, high risk for further deterioration and high frequency of surveillance required.*    For questions or updates, please contact [facility] Please consult [email or web address] for contact info under  Signed, Charlie Pitter, PA-C  06/29/2019 8:23 PM

## 2019-06-29 NOTE — CV Procedure (Signed)
   Acute anterior ST elevation myocardial infarction due to thrombotic occlusion of the proximal to mid LAD.  Acute angiography attempted from right radial but a radial loop prevented access to the brachial artery.  Real-time vascular ultrasound was used for radial access.  Femoral approach using real-time vascular ultrasound for access.  Entry above the bifurcation.  LAD 99% proximal to mid with TIMI grade I flow and large thrombus burden treated with angioplasty followed by stenting using a 2.5 x 32 Onyx postdilated to 3.0 throughout the stent.  TIMI grade III flow noted.  Circumflex is widely patent  Main is widely patent  Right coronary is widely patent  LVEDP 40.  Left ventricular hand-injection was not performed.  Plan: IV Lasix; IV nitroglycerin; BiPAP; intubation if needed.

## 2019-06-29 NOTE — Progress Notes (Signed)
Placed patient on BIPAP due to increased respiratory distress. Dr.Smith at bedside, patient transported to room 2H24.

## 2019-06-30 ENCOUNTER — Inpatient Hospital Stay (HOSPITAL_COMMUNITY): Payer: Medicare Other

## 2019-06-30 ENCOUNTER — Other Ambulatory Visit: Payer: Self-pay

## 2019-06-30 DIAGNOSIS — I255 Ischemic cardiomyopathy: Secondary | ICD-10-CM | POA: Diagnosis not present

## 2019-06-30 DIAGNOSIS — E782 Mixed hyperlipidemia: Secondary | ICD-10-CM | POA: Diagnosis not present

## 2019-06-30 DIAGNOSIS — I2102 ST elevation (STEMI) myocardial infarction involving left anterior descending coronary artery: Secondary | ICD-10-CM | POA: Diagnosis not present

## 2019-06-30 DIAGNOSIS — I5021 Acute systolic (congestive) heart failure: Secondary | ICD-10-CM | POA: Diagnosis not present

## 2019-06-30 DIAGNOSIS — I5023 Acute on chronic systolic (congestive) heart failure: Secondary | ICD-10-CM

## 2019-06-30 LAB — COMPREHENSIVE METABOLIC PANEL
ALT: 82 U/L — ABNORMAL HIGH (ref 0–44)
AST: 450 U/L — ABNORMAL HIGH (ref 15–41)
Albumin: 3.4 g/dL — ABNORMAL LOW (ref 3.5–5.0)
Alkaline Phosphatase: 26 U/L — ABNORMAL LOW (ref 38–126)
Anion gap: 14 (ref 5–15)
BUN: 15 mg/dL (ref 8–23)
CO2: 22 mmol/L (ref 22–32)
Calcium: 9.2 mg/dL (ref 8.9–10.3)
Chloride: 99 mmol/L (ref 98–111)
Creatinine, Ser: 1.37 mg/dL — ABNORMAL HIGH (ref 0.44–1.00)
GFR calc Af Amer: 43 mL/min — ABNORMAL LOW (ref >60)
GFR calc non Af Amer: 37 mL/min — ABNORMAL LOW (ref >60)
Glucose, Bld: 282 mg/dL — ABNORMAL HIGH (ref 70–99)
Potassium: 4.7 mmol/L (ref 3.5–5.1)
Sodium: 135 mmol/L (ref 135–145)
Total Bilirubin: 0.7 mg/dL (ref 0.3–1.2)
Total Protein: 6.7 g/dL (ref 6.5–8.1)

## 2019-06-30 LAB — TROPONIN I (HIGH SENSITIVITY)
Troponin I (High Sensitivity): >27000 ng/L (ref <18)
Troponin I (High Sensitivity): >27000 ng/L (ref <18)

## 2019-06-30 LAB — BASIC METABOLIC PANEL
Anion gap: 12 (ref 5–15)
BUN: 15 mg/dL (ref 8–23)
CO2: 27 mmol/L (ref 22–32)
Calcium: 9.4 mg/dL (ref 8.9–10.3)
Chloride: 98 mmol/L (ref 98–111)
Creatinine, Ser: 1.43 mg/dL — ABNORMAL HIGH (ref 0.44–1.00)
GFR calc Af Amer: 41 mL/min — ABNORMAL LOW (ref >60)
GFR calc non Af Amer: 35 mL/min — ABNORMAL LOW (ref >60)
Glucose, Bld: 292 mg/dL — ABNORMAL HIGH (ref 70–99)
Potassium: 5.7 mmol/L — ABNORMAL HIGH (ref 3.5–5.1)
Sodium: 137 mmol/L (ref 135–145)

## 2019-06-30 LAB — CBC
HCT: 42.1 % (ref 36.0–46.0)
Hemoglobin: 14 g/dL (ref 12.0–15.0)
MCH: 31 pg (ref 26.0–34.0)
MCHC: 33.3 g/dL (ref 30.0–36.0)
MCV: 93.1 fL (ref 80.0–100.0)
Platelets: 261 10*3/uL (ref 150–400)
RBC: 4.52 MIL/uL (ref 3.87–5.11)
RDW: 13.2 % (ref 11.5–15.5)
WBC: 21.7 10*3/uL — ABNORMAL HIGH (ref 4.0–10.5)
nRBC: 0 % (ref 0.0–0.2)

## 2019-06-30 LAB — GLUCOSE, CAPILLARY
Glucose-Capillary: 224 mg/dL — ABNORMAL HIGH (ref 70–99)
Glucose-Capillary: 185 mg/dL — ABNORMAL HIGH (ref 70–99)
Glucose-Capillary: 148 mg/dL — ABNORMAL HIGH (ref 70–99)
Glucose-Capillary: 148 mg/dL — ABNORMAL HIGH (ref 70–99)

## 2019-06-30 LAB — POCT ACTIVATED CLOTTING TIME
Activated Clotting Time: 147 seconds
Activated Clotting Time: 191 seconds
Activated Clotting Time: 252 seconds
Activated Clotting Time: 307 seconds

## 2019-06-30 LAB — MRSA PCR SCREENING: MRSA by PCR: POSITIVE — AB

## 2019-06-30 LAB — ECHOCARDIOGRAM COMPLETE
Height: 65 in
Weight: 3492.09 oz

## 2019-06-30 MED ORDER — CHLORHEXIDINE GLUCONATE CLOTH 2 % EX PADS
6.0000 | MEDICATED_PAD | Freq: Every day | CUTANEOUS | Status: DC
  Administered 2019-06-30 – 2019-07-02 (×3): 6 via TOPICAL

## 2019-06-30 MED ORDER — ATROPINE SULFATE 1 MG/10ML IJ SOSY
PREFILLED_SYRINGE | INTRAMUSCULAR | Status: AC
  Filled 2019-06-30: qty 10

## 2019-06-30 MED ORDER — ATORVASTATIN CALCIUM 80 MG PO TABS: 80.0000 mg | ORAL_TABLET | Freq: Every day | ORAL | Status: DC

## 2019-06-30 MED ORDER — INSULIN ASPART 100 UNIT/ML ~~LOC~~ SOLN
0.0000 [IU] | Freq: Three times a day (TID) | SUBCUTANEOUS | Status: DC
  Administered 2019-06-30 – 2019-07-01 (×2): 1 [IU] via SUBCUTANEOUS
  Administered 2019-07-01: 12:00:00 2 [IU] via SUBCUTANEOUS
  Administered 2019-07-02: 1 [IU] via SUBCUTANEOUS
  Administered 2019-07-02: 3 [IU] via SUBCUTANEOUS

## 2019-06-30 MED ORDER — MUPIROCIN 2 % EX OINT
1.0000 "application " | TOPICAL_OINTMENT | Freq: Two times a day (BID) | CUTANEOUS | Status: DC
  Administered 2019-06-30 – 2019-07-02 (×5): 1 via NASAL
  Filled 2019-06-30: qty 22

## 2019-06-30 MED ORDER — INSULIN ASPART 100 UNIT/ML ~~LOC~~ SOLN: 0.0000 [IU] | Freq: Every day | SUBCUTANEOUS | Status: DC

## 2019-06-30 MED ORDER — ROSUVASTATIN CALCIUM 20 MG PO TABS
40.0000 mg | ORAL_TABLET | Freq: Every day | ORAL | Status: DC
  Administered 2019-06-30 – 2019-07-02 (×3): 40 mg via ORAL
  Filled 2019-06-30 (×3): qty 2

## 2019-06-30 MED ORDER — CARVEDILOL 3.125 MG PO TABS
3.1250 mg | ORAL_TABLET | Freq: Two times a day (BID) | ORAL | Status: DC
  Administered 2019-06-30 – 2019-07-01 (×3): 3.125 mg via ORAL
  Filled 2019-06-30 (×3): qty 1

## 2019-06-30 MED FILL — Tirofiban HCl in NaCl 0.9% IV Soln 5 MG/100ML (Base Equiv): INTRAVENOUS | Qty: 100 | Status: AC

## 2019-06-30 NOTE — Progress Notes (Signed)
  Echocardiogram 2D Echocardiogram has been performed.  Terry Hanson G Laconda Basich 06/30/2019, 8:50 AM

## 2019-06-30 NOTE — Progress Notes (Signed)
Progress Note  Patient Name: Terry Hanson Date of Encounter: 06/30/2019  Primary Cardiologist: Dr. Verdis Prime  Subjective   Postop day #1 anterior STEMI treated with PCI drug-eluting stenting of the proximal LAD PAD femoral approach.  Inpatient Medications    Scheduled Meds: . aspirin  81 mg Oral Daily  . atorvastatin  80 mg Oral Daily  . carvedilol  3.125 mg Oral BID WC  . Chlorhexidine Gluconate Cloth  6 each Topical Q0600  . heparin  5,000 Units Subcutaneous Q8H  . mupirocin ointment  1 application Nasal BID  . sodium chloride flush  3 mL Intravenous Q12H  . ticagrelor  90 mg Oral BID   Continuous Infusions: . sodium chloride    . sodium chloride    . nitroGLYCERIN Stopped (06/30/19 0925)   PRN Meds: sodium chloride, acetaminophen, ondansetron (ZOFRAN) IV, oxyCODONE, sodium chloride flush   Vital Signs    Vitals:   06/30/19 0600 06/30/19 0700 06/30/19 0800 06/30/19 0900  BP: 106/70 118/66 121/60 127/65  Pulse:   92   Resp: 20 (!) 23 (!) 26 17  Temp:   98.2 F (36.8 C)   TempSrc:   Oral   SpO2: 100% 100% 99% 98%  Weight:      Height:        Intake/Output Summary (Last 24 hours) at 06/30/2019 0944 Last data filed at 06/30/2019 0900 Gross per 24 hour  Intake 135 ml  Output 1000 ml  Net -865 ml   Last 3 Weights 06/29/2019 06/29/2019  Weight (lbs) 218 lb 4.1 oz 180 lb  Weight (kg) 99 kg 81.647 kg      Telemetry    Sinus rhythm- Personally Reviewed  ECG    Normal sinus rhythm at 91 with septal Q waves, poor R wave progression and Q's in 1L- Personally Reviewed  Physical Exam   GEN: No acute distress.   Neck: No JVD Cardiac: RRR, no murmurs, rubs, or gallops.  Respiratory: Clear to auscultation bilaterally. GI: Soft, nontender, non-distended  MS: No edema; No deformity. Neuro:  Nonfocal  Psych: Normal affect   Labs    High Sensitivity Troponin:   Recent Labs  Lab 06/29/19 1953 06/29/19 2226 06/29/19 2328 06/30/19 0150  TROPONINIHS 454*  >27,000* >27,000* >27,000*      Chemistry Recent Labs  Lab 06/29/19 1953 06/29/19 2328 06/30/19 0150  NA 139 137 135  K 3.8 5.7* 4.7  CL 100 98 99  CO2 24 27 22   GLUCOSE 264* 292* 282*  BUN 14 15 15   CREATININE 1.34* 1.43* 1.37*  CALCIUM 9.9 9.4 9.2  PROT 7.5  --  6.7  ALBUMIN 3.9  --  3.4*  AST 25  --  450*  ALT 20  --  82*  ALKPHOS 29*  --  26*  BILITOT 0.7  --  0.7  GFRNONAA 38* 35* 37*  GFRAA 44* 41* 43*  ANIONGAP 15 12 14      Hematology Recent Labs  Lab 06/29/19 1953 06/30/19 0150  WBC 14.3* 21.7*  RBC 4.71 4.52  HGB 14.4 14.0  HCT 44.6 42.1  MCV 94.7 93.1  MCH 30.6 31.0  MCHC 32.3 33.3  RDW 13.0 13.2  PLT 249 261    BNP Recent Labs  Lab 06/29/19 2218  BNP 37.2     DDimer No results for input(s): DDIMER in the last 168 hours.   Radiology    CARDIAC CATHETERIZATION  Result Date: 06/29/2019  Anterior ST elevation myocardial infarction due to occlusion  of the proximal LAD.  99% stenosis/thrombotic with TIMI I flow reduced to 0% with TIMI grade III flow following stent implantation using a 32 x 2.5 mm diameter onyx postdilated to 3.0 mm throughout the proximal two thirds of the stent.  The LAD is otherwise widely patent.  Less than 20% proximal left main  Small first obtuse marginal with proximal 70% narrowing.  Mid to distal circumflex with 60% narrowing.  Right coronary arises somewhat anterior.  There is 40 to 50% proximal/ostial narrowing.  There is 70% hazy eccentric mid RCA stenosis.  LVEDP 40, consistent with acute systolic heart failure.  Prior status of the left ventricle was not known. RECOMMENDATIONS:  Management of acute presumed systolic heart failure: BiPAP; IV Lasix; Foley catheter; IV nitroglycerin 10-100 mics to keep systolic blood pressure 110 to 120 mmHg.  High intensity statin therapy  Once volume overload has resolved, beta-blocker and angiotensin receptor/ACE/Arni therapy should be considered  2D Doppler echocardiogram to  assess LV function in a.m.  Medical therapy of right coronary and circumflex disease.  Aggressive risk factor modification.  DG Chest Port 1 View  Result Date: 06/29/2019 CLINICAL DATA:  Chest pain EXAM: PORTABLE CHEST 1 VIEW COMPARISON:  05/27/2018 FINDINGS: Hazy opacities throughout the left lung. Slight parahilar opacity in the right lung. Cardiomediastinal contours are normal. No pleural effusion or pneumothorax. IMPRESSION: Left-greater-than-right pulmonary opacities favored to indicate pulmonary edema. Electronically Signed   By: Deatra Robinson M.D.   On: 06/29/2019 22:15   ECHOCARDIOGRAM COMPLETE  Result Date: 06/30/2019    ECHOCARDIOGRAM REPORT   Patient Name:   Terry Hanson  Date of Exam: 06/30/2019 Medical Rec #:  [id number]  Height:       65.0 in Accession #:    [id number] Weight:       218.3 lb Date of Birth:  06-15-1940   BSA:          2.053 m Patient Age:    78 years   BP:           117/64 mmHg Patient Gender: F          HR:           92 bpm. Exam Location:  Inpatient Procedure: 2D Echo, Cardiac Doppler and Color Doppler Indications:    I50.23 Acute on chronic systolic (congestive) heart failure  History:        Patient has no prior history of Echocardiogram examinations.                 Cardiomegaly; Risk Factors:Hypertension and Diabetes.  Sonographer:    Elmarie Shiley Dance Referring Phys: [phone] [facility]  Sonographer Comments: Suboptimal subcostal window. IMPRESSIONS  1. Left ventricular ejection fraction, by estimation, is 30 to 35%. The left ventricle has moderately decreased function. The left ventricle demonstrates regional wall motion abnormalities (see scoring diagram/findings for description). There is moderate left ventricular hypertrophy. Left ventricular diastolic parameters are consistent with Grade I diastolic dysfunction (impaired relaxation). There is mild dyskinesis of the left ventricular, apex. There is severe hypokinesis of the left ventricular, mid-apical anterior wall,  anteroseptal wall, inferior wall and inferoseptal wall. No intracavitary thrombus is seen, but images are suboptimal.  2. Right ventricular systolic function is normal. The right ventricular size is normal. There is mildly elevated pulmonary artery systolic pressure. The estimated right ventricular systolic pressure is 36.2 mmHg.  3. Left atrial size was mildly dilated.  4. The mitral valve is normal in structure. No evidence of mitral valve  regurgitation.  5. Aortic valve gradients are underestimated due to poor left ventricular systolic function. The aortic valve is tricuspid. Aortic valve regurgitation is not visualized. Mild aortic valve stenosis.  6. The inferior vena cava is dilated in size with <50% respiratory variability, suggesting right atrial pressure of 15 mmHg. FINDINGS  Left Ventricle: Left ventricular ejection fraction, by estimation, is 30 to 35%. The left ventricle has moderately decreased function. The left ventricle demonstrates regional wall motion abnormalities. Mild dyskinesis of the left ventricular, entire apical segment. Severe hypokinesis of the left ventricular, mid-apical anterior wall, anteroseptal wall, inferior wall and inferoseptal wall. The left ventricular internal cavity size was normal in size. There is moderate left ventricular hypertrophy. Left ventricular diastolic parameters are consistent with Grade I diastolic dysfunction (impaired relaxation). Normal left ventricular filling pressure. Right Ventricle: The right ventricular size is normal. No increase in right ventricular wall thickness. Right ventricular systolic function is normal. There is mildly elevated pulmonary artery systolic pressure. The tricuspid regurgitant velocity is 2.30  m/s, and with an assumed right atrial pressure of 15 mmHg, the estimated right ventricular systolic pressure is 36.2 mmHg. Left Atrium: Left atrial size was mildly dilated. Right Atrium: Right atrial size was normal in size. Pericardium:  There is no evidence of pericardial effusion. Mitral Valve: The mitral valve is normal in structure. Mild mitral annular calcification. No evidence of mitral valve regurgitation. Tricuspid Valve: The tricuspid valve is normal in structure. Tricuspid valve regurgitation is trivial. Aortic Valve: Aortic valve gradients are underestimated due to poor left ventricular systolic function. The aortic valve is tricuspid. Aortic valve regurgitation is not visualized. Mild aortic stenosis is present. Aortic valve mean gradient measures 7.7 mmHg. Aortic valve peak gradient measures 15.3 mmHg. Dimensionless index is 0.43. Pulmonic Valve: The pulmonic valve was not well visualized. Pulmonic valve regurgitation is not visualized. Aorta: The aortic root and ascending aorta are structurally normal, with no evidence of dilitation. Venous: The inferior vena cava is dilated in size with less than 50% respiratory variability, suggesting right atrial pressure of 15 mmHg. IAS/Shunts: No atrial level shunt detected by color flow Doppler.  LEFT VENTRICLE PLAX 2D LVIDd:         2.70 cm  Diastology LVIDs:         2.10 cm  LV e' lateral:   5.00 cm/s LV PW:         1.70 cm  LV E/e' lateral: 10.4 LV IVS:        1.40 cm  LV e' medial:    3.81 cm/s LVOT diam:     1.90 cm  LV E/e' medial:  13.6 LV SV:         51 LV SV Index:   25 LVOT Area:     2.84 cm  RIGHT VENTRICLE             IVC RV Basal diam:  2.10 cm     IVC diam: 2.00 cm RV S prime:     12.50 cm/s TAPSE (M-mode): 2.0 cm LEFT ATRIUM           Index       RIGHT ATRIUM          Index LA diam:      3.70 cm 1.80 cm/m  RA Area:     9.22 cm LA Vol (A2C): 54.6 ml 26.60 ml/m RA Volume:   18.10 ml 8.82 ml/m LA Vol (A4C): 44.5 ml 21.68 ml/m  AORTIC VALVE AV Area (Vmax): 1.23  cm AV Vmax:        195.46 cm/s AV Peak Grad:   15.3 mmHg AV Mean Grad:   7.7 mmHg LVOT Vmax:      85.00 cm/s LVOT Vmean:     56.300 cm/s LVOT VTI:       0.180 m  AORTA Ao Root diam: 3.00 cm Ao Asc diam:  3.40 cm MITRAL  VALVE                TRICUSPID VALVE MV Area (PHT): 3.99 cm     TR Peak grad:   21.2 mmHg MV Decel Time: 190 msec     TR Vmax:        230.00 cm/s MV E velocity: 52.00 cm/s MV A velocity: 106.00 cm/s  SHUNTS MV E/A ratio:  0.49         Systemic VTI:  0.18 m                             Systemic Diam: 1.90 cm Sanda Klein MD Electronically signed by Sanda Klein MD Signature Date/Time: 06/30/2019/9:21:52 AM    Final     Cardiac Studies   Cardiac catheterization (06/29/2019)  Conclusion   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  99% stenosis/thrombotic with TIMI I flow reduced to 0% with TIMI grade III flow following stent implantation using a 32 x 2.5 mm diameter onyx postdilated to 3.0 mm throughout the proximal two thirds of the stent.  The LAD is otherwise widely patent.  Less than 20% proximal left main  Small first obtuse marginal with proximal 70% narrowing.  Mid to distal circumflex with 60% narrowing.  Right coronary arises somewhat anterior.  There is 40 to 50% proximal/ostial narrowing.  There is 70% hazy eccentric mid RCA stenosis.  LVEDP 40, consistent with acute systolic heart failure.  Prior status of the left ventricle was not known.  RECOMMENDATIONS:   Management of acute presumed systolic heart failure: BiPAP; IV Lasix; Foley catheter; IV nitroglycerin 10-100 mics to keep systolic blood pressure 440 to 120 mmHg.  High intensity statin therapy  Once volume overload has resolved, beta-blocker and angiotensin receptor/ACE/Arni therapy should be considered  2D Doppler echocardiogram to assess LV function in a.m.  Medical therapy of right coronary and circumflex disease.  Aggressive risk factor modification.   Coronary Diagrams  Diagnostic Dominance: Right  Intervention   2D echocardiogram (06/30/2019)  IMPRESSIONS    1. Left ventricular ejection fraction, by estimation, is 30 to 35%. The  left ventricle has  moderately decreased function. The left ventricle  demonstrates regional wall motion abnormalities (see scoring  diagram/findings for description). There is  moderate left ventricular hypertrophy. Left ventricular diastolic  parameters are consistent with Grade I diastolic dysfunction (impaired  relaxation). There is mild dyskinesis of the left ventricular, apex. There  is severe hypokinesis of the left  ventricular, mid-apical anterior wall, anteroseptal wall, inferior wall  and inferoseptal wall. No intracavitary thrombus is seen, but images are  suboptimal.  2. Right ventricular systolic function is normal. The right ventricular  size is normal. There is mildly elevated pulmonary artery systolic  pressure. The estimated right ventricular systolic pressure is 10.2 mmHg.  3. Left atrial size was mildly dilated.  4. The mitral valve is normal in structure. No evidence of mitral valve  regurgitation.  5. Aortic valve gradients are underestimated due to poor left ventricular  systolic function.  The aortic valve is tricuspid. Aortic valve  regurgitation is not visualized. Mild aortic valve stenosis.  6. The inferior vena cava is dilated in size with <50% respiratory  variability, suggesting right atrial pressure of 15 mmHg.   Patient Profile     79 y.o. female postop day 1 large anterior STEMI with troponins greater than 27,000, EF 30 to 35% with anteroapical wall motion normality.  Her LVEDP was 40 and she was treated with IV Lasix and nitroglycerin.  She diuresed a liter.  She feels clinically improved.  She has noncritical disease in her remaining vessels which will be treated medically.  Assessment & Plan    1: Coronary artery disease-postop day 1 large anterior STEMI treated with PCI drug-eluting stenting via the femoral approach by Dr. Katrinka Blazing with a 34 mm stent postdilated to 3 mm.  She has noncritical disease in the remaining vessels which will be treated medically.  She is on  dual antiplatelet therapy including aspirin and Brilinta.  2: Ischemic cardiomyopathy-EF in the 30 to 35% range with anterior wall motion abnormalities consistent with her anatomy.  We will start low-dose carvedilol and add ARB based on her renal function and blood pressure  3: Hyperlipidemia-on simvastatin as an outpatient with lipid profile performed yesterday revealing total cholesterol 266 and LDL of 192.  She was placed on high-dose atorvastatin.  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  Will start sliding scale insulin today.  Patient stable for transfer to a telemetry floor.  She will receive physical therapy/cardiac rehab.  Plan ambulation today, titration of medications.  Anticipate discharge in the next 24 to 48 hours.  For questions or updates, please contact [facility] Please consult [email or web address] for contact info under        Signed, Nanetta Batty, MD  06/30/2019, 9:44 AM

## 2019-06-30 NOTE — Progress Notes (Signed)
CARDIAC REHAB PHASE I   PRE:  Rate/Rhythm: 92 SR  BP:  Supine: 133/60  Sitting:   Standing:    SaO2: 91%RA  MODE:  Ambulation: 190 ft   POST:  Rate/Rhythm: 120 ST  BP:  Supine: 124/66  Sitting:   Standing:    SaO2: 95%RA 1410-1509 Pt very sleepy in recliner. Son and daughter in room. Discussed with them the importance of brilinta with stent. Discussed stent card which they have. Discussed MI restrictions, tobacco cessation (chews) and gave MI booklet. Pt walked 190 ft on RA with rolling walker, gait belt use, and asst x 2. Stopped a couple of times to rest. To BSC and then to bed so she can sleep. Will continue ed tomorrow. No c/o CP.   [name], RN BSN  06/30/2019 3:03 PM

## 2019-07-01 DIAGNOSIS — I255 Ischemic cardiomyopathy: Secondary | ICD-10-CM | POA: Diagnosis not present

## 2019-07-01 DIAGNOSIS — I10 Essential (primary) hypertension: Secondary | ICD-10-CM | POA: Diagnosis not present

## 2019-07-01 DIAGNOSIS — I2102 ST elevation (STEMI) myocardial infarction involving left anterior descending coronary artery: Secondary | ICD-10-CM | POA: Diagnosis not present

## 2019-07-01 DIAGNOSIS — E782 Mixed hyperlipidemia: Secondary | ICD-10-CM | POA: Diagnosis not present

## 2019-07-01 LAB — POCT I-STAT, CHEM 8
BUN: 16 mg/dL (ref 8–23)
Calcium, Ion: 1.27 mmol/L (ref 1.15–1.40)
Chloride: 101 mmol/L (ref 98–111)
Creatinine, Ser: 1 mg/dL (ref 0.44–1.00)
Glucose, Bld: 265 mg/dL — ABNORMAL HIGH (ref 70–99)
HCT: 43 % (ref 36.0–46.0)
Hemoglobin: 14.6 g/dL (ref 12.0–15.0)
Potassium: 3.8 mmol/L (ref 3.5–5.1)
Sodium: 138 mmol/L (ref 135–145)
TCO2: 26 mmol/L (ref 22–32)

## 2019-07-01 LAB — GLUCOSE, CAPILLARY
Glucose-Capillary: 137 mg/dL — ABNORMAL HIGH (ref 70–99)
Glucose-Capillary: 169 mg/dL — ABNORMAL HIGH (ref 70–99)
Glucose-Capillary: 114 mg/dL — ABNORMAL HIGH (ref 70–99)
Glucose-Capillary: 138 mg/dL — ABNORMAL HIGH (ref 70–99)

## 2019-07-01 LAB — POCT ACTIVATED CLOTTING TIME: Activated Clotting Time: 301 seconds

## 2019-07-01 LAB — HEMOGLOBIN A1C
Hgb A1c MFr Bld: 6.5 % — ABNORMAL HIGH (ref 4.8–5.6)
Mean Plasma Glucose: 140 mg/dL

## 2019-07-01 MED ORDER — CARVEDILOL 6.25 MG PO TABS
6.2500 mg | ORAL_TABLET | Freq: Two times a day (BID) | ORAL | Status: DC
  Administered 2019-07-01: 6.25 mg via ORAL
  Filled 2019-07-01 (×2): qty 1

## 2019-07-01 NOTE — Care Management (Signed)
1504 07-01-19 Case Manager received consult for Brilinta cost. Benefits check submitted and Case Manager will follow for cost Graves-Bigelow, Lamar Laundry, RN,BSN Case Manager

## 2019-07-01 NOTE — TOC Benefit Eligibility Note (Signed)
Transition of Care [facility]) Benefit Eligibility Note    Patient Details  Name: Terry Hanson MRN: [id number] Date of Birth: 11/13/1940   Medication/Dose: BRILINTA  90  MG BID  Covered?: Yes  Tier: 3 Drug  Prescription Coverage Preferred Pharmacy: [facility]  with Person/Company/Phone Number:: RUTH  @  [employer] # [phone]  Co-Pay: $ 142.00  Prior Approval: No  Deductible: Unmet       Mardene Sayer Phone Number: 07/01/2019, 3:50 PM

## 2019-07-01 NOTE — Progress Notes (Signed)
CARDIAC REHAB PHASE I   PRE:  Rate/Rhythm: 83 SR  BP:  Supine: 131/49  Sitting:   Standing:    SaO2: 98%RA  MODE:  Ambulation: 370 ft   POST:  Rate/Rhythm: 111 ST  BP:  Supine: 144/56  Sitting:    Standing:    SaO2: 100%RA 1054-1150 Pt  Walked 370 ft on RA with rolling walker and minimal asst. Stopped three times due to SOB. Encouraged pursed lip breathing. To bathroom and then bed after walk. Set up lunch. MI and CHF education completed with pt and daughter who voiced understanding. Reviewed NTG use, walking for ex, heart healthy, low carb and low sodium food choices. Gave CHF booklet since EF low and encouraged daily weights, 2 LFR and 2000 mg sodium restriction. Discussed when to call MD with signs/symptoms of CHF. Encouraged tobacco cessation of chewing. Referred to [facility] CRP 2. Pt has rollator at home for use with walking.   [name], RN BSN  07/01/2019 11:45 AM

## 2019-07-01 NOTE — Progress Notes (Signed)
CCMD called RN and stated pt had run of SVT. Upon arrival pt was sitting in chair eating dinner with no complaints or distress noted. BP 152/73, HR 87. Will continue to monitor.

## 2019-07-01 NOTE — Progress Notes (Signed)
Progress Note  Patient Name: Terry Hanson Date of Encounter: 07/01/2019  Primary Cardiologist: Dr. Verdis Prime  Subjective   Postop day #2 anterior STEMI treated with PCI drug-eluting stenting of the proximal LAD via the  Right femoral approach.  She complains of some shortness of breath after Brilinta.  She denies chest pain.  Inpatient Medications    Scheduled Meds: . aspirin  81 mg Oral Daily  . carvedilol  6.25 mg Oral BID WC  . Chlorhexidine Gluconate Cloth  6 each Topical Q0600  . heparin  5,000 Units Subcutaneous Q8H  . insulin aspart  0-5 Units Subcutaneous QHS  . insulin aspart  0-9 Units Subcutaneous TID WC  . mupirocin ointment  1 application Nasal BID  . rosuvastatin  40 mg Oral Daily  . sodium chloride flush  3 mL Intravenous Q12H  . ticagrelor  90 mg Oral BID   Continuous Infusions: . sodium chloride    . sodium chloride    . nitroGLYCERIN Stopped (06/30/19 0925)   PRN Meds: sodium chloride, acetaminophen, ondansetron (ZOFRAN) IV, oxyCODONE, sodium chloride flush   Vital Signs    Vitals:   07/01/19 0632 07/01/19 0700 07/01/19 0743 07/01/19 0800  BP: (!) 154/65   (!) 137/59  Pulse:      Resp: 20   19  Temp:   98.9 F (37.2 C)   TempSrc:   Oral   SpO2: 96% 95%  97%  Weight:      Height:        Intake/Output Summary (Last 24 hours) at 07/01/2019 0938 Last data filed at 06/30/2019 1000 Gross per 24 hour  Intake 210 ml  Output --  Net 210 ml   Last 3 Weights 07/01/2019 06/29/2019 06/29/2019  Weight (lbs) 217 lb 2.5 oz 218 lb 4.1 oz 180 lb  Weight (kg) 98.5 kg 99 kg 81.647 kg      Telemetry    Sinus rhythm- Personally Reviewed  ECG    Normal sinus rhythm at 91 with septal Q waves, poor R wave progression and Q's in 1L- Personally Reviewed  Physical Exam   GEN: No acute distress.   Neck: No JVD Cardiac: RRR, no murmurs, rubs, or gallops.  Respiratory: Clear to auscultation bilaterally. GI: Soft, nontender, non-distended  MS: No edema; No  deformity. Neuro:  Nonfocal  Psych: Normal affect   Labs    High Sensitivity Troponin:   Recent Labs  Lab 06/29/19 1953 06/29/19 2226 06/29/19 2328 06/30/19 0150  TROPONINIHS 454* >27,000* >27,000* >27,000*      Chemistry Recent Labs  Lab 06/29/19 1953 06/29/19 2328 06/30/19 0150  NA 139 137 135  K 3.8 5.7* 4.7  CL 100 98 99  CO2 24 27 22   GLUCOSE 264* 292* 282*  BUN 14 15 15   CREATININE 1.34* 1.43* 1.37*  CALCIUM 9.9 9.4 9.2  PROT 7.5  --  6.7  ALBUMIN 3.9  --  3.4*  AST 25  --  450*  ALT 20  --  82*  ALKPHOS 29*  --  26*  BILITOT 0.7  --  0.7  GFRNONAA 38* 35* 37*  GFRAA 44* 41* 43*  ANIONGAP 15 12 14      Hematology Recent Labs  Lab 06/29/19 1953 06/30/19 0150  WBC 14.3* 21.7*  RBC 4.71 4.52  HGB 14.4 14.0  HCT 44.6 42.1  MCV 94.7 93.1  MCH 30.6 31.0  MCHC 32.3 33.3  RDW 13.0 13.2  PLT 249 261    BNP Recent Labs  Lab 06/29/19 2218  BNP 37.2     DDimer No results for input(s): DDIMER in the last 168 hours.   Radiology    CARDIAC CATHETERIZATION  Result Date: 06/29/2019  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  99% stenosis/thrombotic with TIMI I flow reduced to 0% with TIMI grade III flow following stent implantation using a 32 x 2.5 mm diameter onyx postdilated to 3.0 mm throughout the proximal two thirds of the stent.  The LAD is otherwise widely patent.  Less than 20% proximal left main  Small first obtuse marginal with proximal 70% narrowing.  Mid to distal circumflex with 60% narrowing.  Right coronary arises somewhat anterior.  There is 40 to 50% proximal/ostial narrowing.  There is 70% hazy eccentric mid RCA stenosis.  LVEDP 40, consistent with acute systolic heart failure.  Prior status of the left ventricle was not known. RECOMMENDATIONS:  Management of acute presumed systolic heart failure: BiPAP; IV Lasix; Foley catheter; IV nitroglycerin 10-100 mics to keep systolic blood pressure 110  to 120 mmHg.  High intensity statin therapy  Once volume overload has resolved, beta-blocker and angiotensin receptor/ACE/Arni therapy should be considered  2D Doppler echocardiogram to assess LV function in a.m.  Medical therapy of right coronary and circumflex disease.  Aggressive risk factor modification.  DG Chest Port 1 View  Result Date: 06/29/2019 CLINICAL DATA:  Chest pain EXAM: PORTABLE CHEST 1 VIEW COMPARISON:  05/27/2018 FINDINGS: Hazy opacities throughout the left lung. Slight parahilar opacity in the right lung. Cardiomediastinal contours are normal. No pleural effusion or pneumothorax. IMPRESSION: Left-greater-than-right pulmonary opacities favored to indicate pulmonary edema. Electronically Signed   By: Deatra Robinson M.D.   On: 06/29/2019 22:15   ECHOCARDIOGRAM COMPLETE  Result Date: 06/30/2019    ECHOCARDIOGRAM REPORT   Patient Name:   Terry Hanson  Date of Exam: 06/30/2019 Medical Rec #:  [id number]  Height:       65.0 in Accession #:    [id number] Weight:       218.3 lb Date of Birth:  [birth year]   BSA:          2.053 m Patient Age:    78 years   BP:           117/64 mmHg Patient Gender: F          HR:           92 bpm. Exam Location:  Inpatient Procedure: 2D Echo, Cardiac Doppler and Color Doppler Indications:    I50.23 Acute on chronic systolic (congestive) heart failure  History:        Patient has no prior history of Echocardiogram examinations.                 Cardiomegaly; Risk Factors:Hypertension and Diabetes.  Sonographer:    Elmarie Shiley Dance Referring Phys: [phone] Barry Dienes [facility]  Sonographer Comments: Suboptimal subcostal window. IMPRESSIONS  1. Left ventricular ejection fraction, by estimation, is 30 to 35%. The left ventricle has moderately decreased function. The left ventricle demonstrates regional wall motion abnormalities (see scoring diagram/findings for description). There is moderate left ventricular hypertrophy. Left ventricular diastolic parameters are consistent with Grade I  diastolic dysfunction (impaired relaxation). There is mild dyskinesis of the left ventricular, apex. There is severe hypokinesis of the left ventricular, mid-apical anterior wall, anteroseptal wall, inferior wall and inferoseptal wall. No intracavitary thrombus is seen, but images are suboptimal.  2. Right ventricular systolic function is normal. The right ventricular  size is normal. There is mildly elevated pulmonary artery systolic pressure. The estimated right ventricular systolic pressure is 36.2 mmHg.  3. Left atrial size was mildly dilated.  4. The mitral valve is normal in structure. No evidence of mitral valve regurgitation.  5. Aortic valve gradients are underestimated due to poor left ventricular systolic function. The aortic valve is tricuspid. Aortic valve regurgitation is not visualized. Mild aortic valve stenosis.  6. The inferior vena cava is dilated in size with <50% respiratory variability, suggesting right atrial pressure of 15 mmHg. FINDINGS  Left Ventricle: Left ventricular ejection fraction, by estimation, is 30 to 35%. The left ventricle has moderately decreased function. The left ventricle demonstrates regional wall motion abnormalities. Mild dyskinesis of the left ventricular, entire apical segment. Severe hypokinesis of the left ventricular, mid-apical anterior wall, anteroseptal wall, inferior wall and inferoseptal wall. The left ventricular internal cavity size was normal in size. There is moderate left ventricular hypertrophy. Left ventricular diastolic parameters are consistent with Grade I diastolic dysfunction (impaired relaxation). Normal left ventricular filling pressure. Right Ventricle: The right ventricular size is normal. No increase in right ventricular wall thickness. Right ventricular systolic function is normal. There is mildly elevated pulmonary artery systolic pressure. The tricuspid regurgitant velocity is 2.30  m/s, and with an assumed right atrial pressure of 15 mmHg,  the estimated right ventricular systolic pressure is 36.2 mmHg. Left Atrium: Left atrial size was mildly dilated. Right Atrium: Right atrial size was normal in size. Pericardium: There is no evidence of pericardial effusion. Mitral Valve: The mitral valve is normal in structure. Mild mitral annular calcification. No evidence of mitral valve regurgitation. Tricuspid Valve: The tricuspid valve is normal in structure. Tricuspid valve regurgitation is trivial. Aortic Valve: Aortic valve gradients are underestimated due to poor left ventricular systolic function. The aortic valve is tricuspid. Aortic valve regurgitation is not visualized. Mild aortic stenosis is present. Aortic valve mean gradient measures 7.7 mmHg. Aortic valve peak gradient measures 15.3 mmHg. Dimensionless index is 0.43. Pulmonic Valve: The pulmonic valve was not well visualized. Pulmonic valve regurgitation is not visualized. Aorta: The aortic root and ascending aorta are structurally normal, with no evidence of dilitation. Venous: The inferior vena cava is dilated in size with less than 50% respiratory variability, suggesting right atrial pressure of 15 mmHg. IAS/Shunts: No atrial level shunt detected by color flow Doppler.  LEFT VENTRICLE PLAX 2D LVIDd:         2.70 cm  Diastology LVIDs:         2.10 cm  LV e' lateral:   5.00 cm/s LV PW:         1.70 cm  LV E/e' lateral: 10.4 LV IVS:        1.40 cm  LV e' medial:    3.81 cm/s LVOT diam:     1.90 cm  LV E/e' medial:  13.6 LV SV:         51 LV SV Index:   25 LVOT Area:     2.84 cm  RIGHT VENTRICLE             IVC RV Basal diam:  2.10 cm     IVC diam: 2.00 cm RV S prime:     12.50 cm/s TAPSE (M-mode): 2.0 cm LEFT ATRIUM           Index       RIGHT ATRIUM          Index LA diam:  3.70 cm 1.80 cm/m  RA Area:     9.22 cm LA Vol (A2C): 54.6 ml 26.60 ml/m RA Volume:   18.10 ml 8.82 ml/m LA Vol (A4C): 44.5 ml 21.68 ml/m  AORTIC VALVE AV Area (Vmax): 1.23 cm AV Vmax:        195.46 cm/s AV Peak  Grad:   15.3 mmHg AV Mean Grad:   7.7 mmHg LVOT Vmax:      85.00 cm/s LVOT Vmean:     56.300 cm/s LVOT VTI:       0.180 m  AORTA Ao Root diam: 3.00 cm Ao Asc diam:  3.40 cm MITRAL VALVE                TRICUSPID VALVE MV Area (PHT): 3.99 cm     TR Peak grad:   21.2 mmHg MV Decel Time: 190 msec     TR Vmax:        230.00 cm/s MV E velocity: 52.00 cm/s MV A velocity: 106.00 cm/s  SHUNTS MV E/A ratio:  0.49         Systemic VTI:  0.18 m                             Systemic Diam: 1.90 cm Rachelle Hora Croitoru MD Electronically signed by Thurmon Fair MD Signature Date/Time: 06/30/2019/9:21:52 AM    Final     Cardiac Studies   Cardiac catheterization (06/29/2019)  Conclusion   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  99% stenosis/thrombotic with TIMI I flow reduced to 0% with TIMI grade III flow following stent implantation using a 32 x 2.5 mm diameter onyx postdilated to 3.0 mm throughout the proximal two thirds of the stent.  The LAD is otherwise widely patent.  Less than 20% proximal left main  Small first obtuse marginal with proximal 70% narrowing.  Mid to distal circumflex with 60% narrowing.  Right coronary arises somewhat anterior.  There is 40 to 50% proximal/ostial narrowing.  There is 70% hazy eccentric mid RCA stenosis.  LVEDP 40, consistent with acute systolic heart failure.  Prior status of the left ventricle was not known.  RECOMMENDATIONS:   Management of acute presumed systolic heart failure: BiPAP; IV Lasix; Foley catheter; IV nitroglycerin 10-100 mics to keep systolic blood pressure 110 to 120 mmHg.  High intensity statin therapy  Once volume overload has resolved, beta-blocker and angiotensin receptor/ACE/Arni therapy should be considered  2D Doppler echocardiogram to assess LV function in a.m.  Medical therapy of right coronary and circumflex disease.  Aggressive risk factor modification.   Coronary  Diagrams  Diagnostic Dominance: Right  Intervention   2D echocardiogram (06/30/2019)  IMPRESSIONS    1. Left ventricular ejection fraction, by estimation, is 30 to 35%. The  left ventricle has moderately decreased function. The left ventricle  demonstrates regional wall motion abnormalities (see scoring  diagram/findings for description). There is  moderate left ventricular hypertrophy. Left ventricular diastolic  parameters are consistent with Grade I diastolic dysfunction (impaired  relaxation). There is mild dyskinesis of the left ventricular, apex. There  is severe hypokinesis of the left  ventricular, mid-apical anterior wall, anteroseptal wall, inferior wall  and inferoseptal wall. No intracavitary thrombus is seen, but images are  suboptimal.  2. Right ventricular systolic function is normal. The right ventricular  size is normal. There is mildly elevated pulmonary artery systolic  pressure. The estimated right ventricular systolic pressure  is 36.2 mmHg.  3. Left atrial size was mildly dilated.  4. The mitral valve is normal in structure. No evidence of mitral valve  regurgitation.  5. Aortic valve gradients are underestimated due to poor left ventricular  systolic function. The aortic valve is tricuspid. Aortic valve  regurgitation is not visualized. Mild aortic valve stenosis.  6. The inferior vena cava is dilated in size with <50% respiratory  variability, suggesting right atrial pressure of 15 mmHg.   Patient Profile     79 y.o. female postop day #2 large anterior STEMI with troponins greater than 27,000, EF 30 to 35% with anteroapical wall motion normality.  Her LVEDP was 40 and she was treated with IV Lasix and nitroglycerin.  She diuresed a liter.  She feels clinically improved.  She has noncritical disease in her remaining vessels which will be treated medically.  Assessment & Plan    1: Coronary artery disease-postop day 1 large anterior STEMI treated  with PCI drug-eluting stenting via the femoral approach by Dr. Tamala Julian with a 34 mm stent postdilated to 3 mm.  She has noncritical disease in the remaining vessels which will be treated medically.  She is on dual antiplatelet therapy including aspirin and Brilinta.  She has experiencing shortness of breath with Brilinta.  2: Ischemic cardiomyopathy-EF in the 30 to 35% range with anterior wall motion abnormalities consistent with her anatomy.  We will start low-dose carvedilol and add ARB based on her renal function and blood pressure.  Her systolic and diastolic blood pressure is mildly elevated as is her heart rate.  We will uptitrate her carvedilol from 3.125 to 6.25 mg p.o. twice daily.  Given her serum creatinine I will hold off on starting an ARB.  3: Hyperlipidemia-on simvastatin as an outpatient with lipid profile performed yesterday revealing total cholesterol 266 and LDL of 192.  She was placed on high-dose atorvastatin.  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  Will start sliding scale insulin today.  Patient stable for transfer to a telemetry floor.  She will receive physical therapy/cardiac rehab.  Plan ambulation today, titration of medications.  Anticipate discharge in the next 24 to 48 hours.  For questions or updates, please contact [place] Please consult [email or web address] for contact info under        Signed, Quay Burow, MD  07/01/2019, 9:38 AM

## 2019-07-02 ENCOUNTER — Encounter: Payer: Self-pay | Admitting: Interventional Cardiology

## 2019-07-02 DIAGNOSIS — E782 Mixed hyperlipidemia: Secondary | ICD-10-CM | POA: Diagnosis not present

## 2019-07-02 DIAGNOSIS — I2102 ST elevation (STEMI) myocardial infarction involving left anterior descending coronary artery: Secondary | ICD-10-CM | POA: Diagnosis not present

## 2019-07-02 DIAGNOSIS — I5021 Acute systolic (congestive) heart failure: Secondary | ICD-10-CM | POA: Diagnosis not present

## 2019-07-02 LAB — GLUCOSE, CAPILLARY
Glucose-Capillary: 201 mg/dL — ABNORMAL HIGH (ref 70–99)
Glucose-Capillary: 135 mg/dL — ABNORMAL HIGH (ref 70–99)
Glucose-Capillary: 171 mg/dL — ABNORMAL HIGH (ref 70–99)

## 2019-07-02 LAB — HEMOGLOBIN A1C
Hgb A1c MFr Bld: 6.5 % — ABNORMAL HIGH (ref 4.8–5.6)
Mean Plasma Glucose: 140 mg/dL

## 2019-07-02 MED ORDER — CARVEDILOL 12.5 MG PO TABS
12.5000 mg | ORAL_TABLET | Freq: Two times a day (BID) | ORAL | Status: DC
  Administered 2019-07-02: 11:00:00 12.5 mg via ORAL
  Filled 2019-07-02 (×2): qty 1

## 2019-07-02 MED ORDER — NITROGLYCERIN 0.4 MG SL SUBL: 0.4000 mg | SUBLINGUAL_TABLET | SUBLINGUAL | 2 refills | Status: DC | PRN

## 2019-07-02 MED ORDER — ASPIRIN 81 MG PO CHEW: 81.0000 mg | CHEWABLE_TABLET | Freq: Every day | ORAL | 2 refills | Status: DC

## 2019-07-02 MED ORDER — ROSUVASTATIN CALCIUM 40 MG PO TABS: 40.0000 mg | ORAL_TABLET | Freq: Every day | ORAL | 1 refills | Status: DC

## 2019-07-02 MED ORDER — CARVEDILOL 12.5 MG PO TABS: 12.5000 mg | ORAL_TABLET | Freq: Two times a day (BID) | ORAL | 0 refills | Status: DC

## 2019-07-02 MED ORDER — TICAGRELOR 90 MG PO TABS: 90.0000 mg | ORAL_TABLET | Freq: Two times a day (BID) | ORAL | 2 refills | Status: DC

## 2019-07-02 MED FILL — NITROGLYCERIN 0.4 MG TAB SL: 0.4 | 8 days supply | Qty: 25 | Fill #0

## 2019-07-02 MED FILL — ROSUVASTATIN CALCIUM 40 MG: 40 | 90 days supply | Qty: 90 | Fill #0

## 2019-07-02 MED FILL — ASPIRIN LOW DOSE 81 MG CHEW: 81 | 90 days supply | Qty: 90 | Fill #0

## 2019-07-02 MED FILL — BRILINTA 90 MG TABLET: 90 | 30 days supply | Qty: 60 | Fill #0

## 2019-07-02 MED FILL — CARVEDILOL 12.5 MG TABLET: 12.5 | 90 days supply | Qty: 180 | Fill #0

## 2019-07-02 NOTE — Progress Notes (Signed)
CARDIAC REHAB PHASE I   PRE:  Rate/Rhythm: 82 SR  BP:  Supine: 160/88  Sitting:   Standing:    SaO2: 96%RA  MODE:  Ambulation: 430 ft   POST:  Rate/Rhythm: 112 ST  BP:  Supine:   Sitting: 139/90  Standing:    SaO2: 96%RA [id number] Pt walked 430 ft on RA with rolling walker independently with steady gait. Does not need walker in room. Has one at home if needed. Stopped three times due to SOB. Pt stated mask does not help with her breathing. Encouraged to take caffeine with brilinta to see if this helps. No questions re ed done on last 2 days. Sats good on RA.   [name], RN BSN  07/02/2019 9:21 AM

## 2019-07-02 NOTE — Care Management Important Message (Signed)
Important Message  Patient Details  Name: Terry Hanson MRN: [id number] Date of Birth: 12-27-1940   Medicare Important Message Given:  Yes     Terry Hanson 07/02/2019, 9:39 AM

## 2019-07-02 NOTE — Progress Notes (Addendum)
Progress Note  Patient Name: Terry Hanson Date of Encounter: 07/02/2019  Primary Cardiologist: No primary care provider on file.   Subjective   Doing well, but still having issues with shortness of breath. Suspect Brilinta related.  Inpatient Medications    Scheduled Meds: . aspirin  81 mg Oral Daily  . carvedilol  12.5 mg Oral BID WC  . Chlorhexidine Gluconate Cloth  6 each Topical Q0600  . heparin  5,000 Units Subcutaneous Q8H  . insulin aspart  0-5 Units Subcutaneous QHS  . insulin aspart  0-9 Units Subcutaneous TID WC  . mupirocin ointment  1 application Nasal BID  . rosuvastatin  40 mg Oral Daily  . sodium chloride flush  3 mL Intravenous Q12H  . ticagrelor  90 mg Oral BID   Continuous Infusions: . sodium chloride    . sodium chloride    . nitroGLYCERIN Stopped (06/30/19 0925)   PRN Meds: sodium chloride, acetaminophen, ondansetron (ZOFRAN) IV, oxyCODONE, sodium chloride flush   Vital Signs    Vitals:   07/02/19 0100 07/02/19 0526 07/02/19 0528 07/02/19 0800  BP:  (!) 155/59  (!) 160/88  Pulse:  83  87  Resp:  19  18  Temp:  98.4 F (36.9 C)  98.1 F (36.7 C)  TempSrc:  Oral  Oral  SpO2: 100% 97%  97%  Weight:   97.7 kg   Height:        Intake/Output Summary (Last 24 hours) at 07/02/2019 0834 Last data filed at 07/02/2019 0558 Gross per 24 hour  Intake 462 ml  Output -  Net 462 ml   Last 3 Weights 07/02/2019 07/01/2019 06/29/2019  Weight (lbs) 215 lb 6.4 oz 217 lb 2.5 oz 218 lb 4.1 oz  Weight (kg) 97.705 kg 98.5 kg 99 kg      Telemetry    SR-->brief run of NSVT - Personally Reviewed  ECG    No new tracing this morning.  Physical Exam  Pleasant older WF, sitting up in chair.  GEN: No acute distress.   Neck: No JVD Cardiac: RRR, no murmurs, rubs, or gallops.  Respiratory: Clear to auscultation bilaterally. GI: Soft, nontender, non-distended  MS: No edema; No deformity. Neuro:  Nonfocal  Psych: Normal affect   Labs    High Sensitivity  Troponin:   Recent Labs  Lab 06/29/19 1953 06/29/19 2226 06/29/19 2328 06/30/19 0150  TROPONINIHS 454* >27,000* >27,000* >27,000*      Chemistry Recent Labs  Lab 06/29/19 1953 06/29/19 1953 06/29/19 2027 06/29/19 2328 06/30/19 0150  NA 139   < > 138 137 135  K 3.8   < > 3.8 5.7* 4.7  CL 100   < > 101 98 99  CO2 24  --   --  27 22  GLUCOSE 264*   < > 265* 292* 282*  BUN 14   < > 16 15 15   CREATININE 1.34*   < > 1.00 1.43* 1.37*  CALCIUM 9.9  --   --  9.4 9.2  PROT 7.5  --   --   --  6.7  ALBUMIN 3.9  --   --   --  3.4*  AST 25  --   --   --  450*  ALT 20  --   --   --  82*  ALKPHOS 29*  --   --   --  26*  BILITOT 0.7  --   --   --  0.7  GFRNONAA 38*  --   --  35* 37*  GFRAA 44*  --   --  41* 43*  ANIONGAP 15  --   --  12 14   < > = values in this interval not displayed.     Hematology Recent Labs  Lab 06/29/19 1953 06/29/19 2027 06/30/19 0150  WBC 14.3*  --  21.7*  RBC 4.71  --  4.52  HGB 14.4 14.6 14.0  HCT 44.6 43.0 42.1  MCV 94.7  --  93.1  MCH 30.6  --  31.0  MCHC 32.3  --  33.3  RDW 13.0  --  13.2  PLT 249  --  261    BNP Recent Labs  Lab 06/29/19 2218  BNP 37.2     DDimer No results for input(s): DDIMER in the last 168 hours.   Radiology    ECHOCARDIOGRAM COMPLETE  Result Date: 06/30/2019    ECHOCARDIOGRAM REPORT   Patient Name:   Zeanna Mcpartland  Date of Exam: 06/30/2019 Medical Rec #:  [id number]  Height:       65.0 in Accession #:    [id number] Weight:       218.3 lb Date of Birth:  [birth year]   BSA:          2.053 m Patient Age:    38 years   BP:           117/64 mmHg Patient Gender: F          HR:           92 bpm. Exam Location:  Inpatient Procedure: 2D Echo, Cardiac Doppler and Color Doppler Indications:    I50.23 Acute on chronic systolic (congestive) heart failure  History:        Patient has no prior history of Echocardiogram examinations.                 Cardiomegaly; Risk Factors:Hypertension and Diabetes.  Sonographer:    Jonelle Sidle Dance  Referring Phys: [place]  Sonographer Comments: Suboptimal subcostal window. IMPRESSIONS  1. Left ventricular ejection fraction, by estimation, is 30 to 35%. The left ventricle has moderately decreased function. The left ventricle demonstrates regional wall motion abnormalities (see scoring diagram/findings for description). There is moderate left ventricular hypertrophy. Left ventricular diastolic parameters are consistent with Grade I diastolic dysfunction (impaired relaxation). There is mild dyskinesis of the left ventricular, apex. There is severe hypokinesis of the left ventricular, mid-apical anterior wall, anteroseptal wall, inferior wall and inferoseptal wall. No intracavitary thrombus is seen, but images are suboptimal.  2. Right ventricular systolic function is normal. The right ventricular size is normal. There is mildly elevated pulmonary artery systolic pressure. The estimated right ventricular systolic pressure is 70.0 mmHg.  3. Left atrial size was mildly dilated.  4. The mitral valve is normal in structure. No evidence of mitral valve regurgitation.  5. Aortic valve gradients are underestimated due to poor left ventricular systolic function. The aortic valve is tricuspid. Aortic valve regurgitation is not visualized. Mild aortic valve stenosis.  6. The inferior vena cava is dilated in size with <50% respiratory variability, suggesting right atrial pressure of 15 mmHg. FINDINGS  Left Ventricle: Left ventricular ejection fraction, by estimation, is 30 to 35%. The left ventricle has moderately decreased function. The left ventricle demonstrates regional wall motion abnormalities. Mild dyskinesis of the left ventricular, entire apical segment. Severe hypokinesis of the left ventricular, mid-apical anterior wall, anteroseptal wall, inferior wall and inferoseptal wall. The left ventricular internal cavity size was normal in  size. There is moderate left ventricular hypertrophy. Left ventricular  diastolic parameters are consistent with Grade I diastolic dysfunction (impaired relaxation). Normal left ventricular filling pressure. Right Ventricle: The right ventricular size is normal. No increase in right ventricular wall thickness. Right ventricular systolic function is normal. There is mildly elevated pulmonary artery systolic pressure. The tricuspid regurgitant velocity is 2.30  m/s, and with an assumed right atrial pressure of 15 mmHg, the estimated right ventricular systolic pressure is 36.2 mmHg. Left Atrium: Left atrial size was mildly dilated. Right Atrium: Right atrial size was normal in size. Pericardium: There is no evidence of pericardial effusion. Mitral Valve: The mitral valve is normal in structure. Mild mitral annular calcification. No evidence of mitral valve regurgitation. Tricuspid Valve: The tricuspid valve is normal in structure. Tricuspid valve regurgitation is trivial. Aortic Valve: Aortic valve gradients are underestimated due to poor left ventricular systolic function. The aortic valve is tricuspid. Aortic valve regurgitation is not visualized. Mild aortic stenosis is present. Aortic valve mean gradient measures 7.7 mmHg. Aortic valve peak gradient measures 15.3 mmHg. Dimensionless index is 0.43. Pulmonic Valve: The pulmonic valve was not well visualized. Pulmonic valve regurgitation is not visualized. Aorta: The aortic root and ascending aorta are structurally normal, with no evidence of dilitation. Venous: The inferior vena cava is dilated in size with less than 50% respiratory variability, suggesting right atrial pressure of 15 mmHg. IAS/Shunts: No atrial level shunt detected by color flow Doppler.  LEFT VENTRICLE PLAX 2D LVIDd:         2.70 cm  Diastology LVIDs:         2.10 cm  LV e' lateral:   5.00 cm/s LV PW:         1.70 cm  LV E/e' lateral: 10.4 LV IVS:        1.40 cm  LV e' medial:    3.81 cm/s LVOT diam:     1.90 cm  LV E/e' medial:  13.6 LV SV:         51 LV SV Index:   25  LVOT Area:     2.84 cm  RIGHT VENTRICLE             IVC RV Basal diam:  2.10 cm     IVC diam: 2.00 cm RV S prime:     12.50 cm/s TAPSE (M-mode): 2.0 cm LEFT ATRIUM           Index       RIGHT ATRIUM          Index LA diam:      3.70 cm 1.80 cm/m  RA Area:     9.22 cm LA Vol (A2C): 54.6 ml 26.60 ml/m RA Volume:   18.10 ml 8.82 ml/m LA Vol (A4C): 44.5 ml 21.68 ml/m  AORTIC VALVE AV Area (Vmax): 1.23 cm AV Vmax:        195.46 cm/s AV Peak Grad:   15.3 mmHg AV Mean Grad:   7.7 mmHg LVOT Vmax:      85.00 cm/s LVOT Vmean:     56.300 cm/s LVOT VTI:       0.180 m  AORTA Ao Root diam: 3.00 cm Ao Asc diam:  3.40 cm MITRAL VALVE                TRICUSPID VALVE MV Area (PHT): 3.99 cm     TR Peak grad:   21.2 mmHg MV Decel Time: 190 msec     TR Vmax:  230.00 cm/s MV E velocity: 52.00 cm/s MV A velocity: 106.00 cm/s  SHUNTS MV E/A ratio:  0.49         Systemic VTI:  0.18 m                             Systemic Diam: 1.90 cm Thurmon FairMihai Croitoru MD Electronically signed by Thurmon FairMihai Croitoru MD Signature Date/Time: 06/30/2019/9:21:52 AM    Final     Cardiac Studies   Cardiac catheterization (06/29/2019)  Conclusion   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  99% stenosis/thrombotic with TIMI I flow reduced to 0% with TIMI grade III flow following stent implantation using a 32 x 2.5 mm diameter onyx postdilated to 3.0 mm throughout the proximal two thirds of the stent. The LAD is otherwise widely patent.  Less than 20% proximal left main  Small first obtuse marginal with proximal 70% narrowing. Mid to distal circumflex with 60% narrowing.  Right coronary arises somewhat anterior. There is 40 to 50% proximal/ostial narrowing. There is 70% hazy eccentric mid RCA stenosis.  LVEDP 40, consistent with acute systolic heart failure. Prior status of the left ventricle was not known.  RECOMMENDATIONS:   Management of acute presumed systolic heart failure: BiPAP; IV  Lasix; Foley catheter; IV nitroglycerin 10-100 mics to keep systolic blood pressure 110 to 120 mmHg.  High intensity statin therapy  Once volume overload has resolved, beta-blocker and angiotensin receptor/ACE/Arni therapy should be considered  2D Doppler echocardiogram to assess LV function in a.m.  Medical therapy of right coronary and circumflex disease.  Aggressive risk factor modification.   Coronary Diagrams  Diagnostic Dominance: Right  Intervention   2D echocardiogram (06/30/2019)  IMPRESSIONS    1. Left ventricular ejection fraction, by estimation, is 30 to 35%. The  left ventricle has moderately decreased function. The left ventricle  demonstrates regional wall motion abnormalities (see scoring  diagram/findings for description). There is  moderate left ventricular hypertrophy. Left ventricular diastolic  parameters are consistent with Grade I diastolic dysfunction (impaired  relaxation). There is mild dyskinesis of the left ventricular, apex. There  is severe hypokinesis of the left  ventricular, mid-apical anterior wall, anteroseptal wall, inferior wall  and inferoseptal wall. No intracavitary thrombus is seen, but images are  suboptimal.  2. Right ventricular systolic function is normal. The right ventricular  size is normal. There is mildly elevated pulmonary artery systolic  pressure. The estimated right ventricular systolic pressure is 36.2 mmHg.  3. Left atrial size was mildly dilated.  4. The mitral valve is normal in structure. No evidence of mitral valve  regurgitation.  5. Aortic valve gradients are underestimated due to poor left ventricular  systolic function. The aortic valve is tricuspid. Aortic valve  regurgitation is not visualized. Mild aortic valve stenosis.  6. The inferior vena cava is dilated in size with <50% respiratory  variability, suggesting right atrial pressure of 15 mmHg.   Patient Profile     79 y.o. female with PMH  of DM, HL and enlarged heart who presented to the ED with chest pain and found to have a STEMI. Underwent cardiac cath with a large anterior STEMI with troponins greater than 27,000, EF 30 to 35% with anteroapical wall motion normality.  Her LVEDP was 40 and she was treated with IV Lasix and nitroglycerin.  Assessment & Plan    1. STEMI: s/p large anterior STEMI treated with PCI drug-eluting  stenting via the femoral approach by Dr. Katrinka Blazing with a 34 mm stent postdilated to 3 mm.  She has residual disease in the remaining vessels which will be treated medically.  hsTn peaked >27000 x3.  -- on DAPT with aspirin and Brilinta. Has had some trouble with shortness of breath but suspect related to Brilinta. She is willing to continue for now and address at follow up if symptoms are not improving.   2. Ischemic cardiomyopathy: EF in the 30 to 35% range with anterior wall motion abnormalities consistent with her anatomy.  Was started on low-dose carvedilol with blood pressures tolerating.  -- will further increase coreg to 12.5mg  BID today. Hold on starting ACE/ARB/spiro with elevated Cr. Consider adding in follow up if renal function is stable.   3. Hyperlipidemia: on simvastatin as an outpatient, LDL of 192. Switched to high dose atorva -- will need FLP/LFTs in 8 weeks  4. 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  SSI while inpatient. -- Hgb A1c 6.5  For questions or updates, please contact [facility] Please consult [email or web address] for contact info under    Signed, Laverda Page, NP  07/02/2019, 8:34 AM    Agree with note by Laverda Page NP-C  Postop day #3 anterior STEMI treated with PCI and stenting the proximal LAD with a RCA CTO.  Her EF is in the 30 to 35% range.  She is on optimal medical therapy and her beta-blocker was just increased.  She is not on an ARB because of renal function.  She is walking in the hallway with cardiac rehab with some dyspnea on exertion.  Given  her LV dysfunction in the setting of an anterior STEMI she may benefit from wearing a LifeVest for 3 months awaiting improvement LV function.  She can be discharged home today, TOC 7 and follow-up with Dr. Katrinka Blazing.  At that time, a decision will be made with regards to beginning an ARB versus Entresto.   Runell Gess, M.D., FACP, [facility], [facility] [place] [address] [place], [place]  [address]  [phone] 07/02/2019 9:23 AM

## 2019-07-02 NOTE — Care Management (Signed)
[id number] 07-02-19 Case Manager received call that patient will need a Life Vest for transition home. Information sent to [employer] for insurance authorization. Will await call back for authorization updates. Gala Lewandowsky, RN,BSN Case Manager

## 2019-07-02 NOTE — Discharge Summary (Addendum)
Discharge Summary    Patient ID: Terry Hanson,  MRN: [id number], DOB/AGE: 1940-09-11 79 y.o.  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 99% stenosis/thrombotic with TIMI I flow reduced to 0% with TIMI grade III flow following stent implantation using a 32 x 2.5 mm diameter onyx postdilated to 3.0 mm throughout the proximal two thirds of the stent.  The LAD is otherwise widely patent. Less than 20% proximal left main Small first obtuse marginal with proximal 70% narrowing.  Mid to distal circumflex with 60% narrowing. Right coronary arises somewhat anterior.  There is 40 to 50% proximal/ostial narrowing.  There is 70% hazy eccentric mid RCA stenosis. LVEDP 40, consistent with acute systolic heart failure.  Prior status of the left ventricle was not known.   RECOMMENDATIONS:   Management of acute presumed systolic heart failure: BiPAP; IV Lasix; Foley catheter; IV nitroglycerin 10-100 mics to keep systolic blood pressure 110 to 120 mmHg. High intensity statin therapy Once volume overload has resolved, beta-blocker and angiotensin receptor/ACE/Arni therapy should be considered 2D Doppler echocardiogram to assess LV function in  a.m. Medical therapy of right coronary and circumflex disease. Aggressive risk factor modification.  Diagnostic Dominance: Right  Intervention    _____________   History of Present Illness     Terry Hanson reports a prior history of enlarged heart but denies any history of CAD or MI. She was in her USOH today when she developed severe substernal chest pain while at home just going about her normal day. This was associated with nausea, SOB and left arm pain. Symptoms were going on about 90 minutes before she reached out to EMS. EKG was abnormal with changes in V1-V2 concerning for anterior MI. Initial tracing was read as atrial fibrillation but there were clear P waves on the EKG. Code STEMI was called and she was transported emergently to the ED. She was given 324mg  ASA and 3 SL NTG prior to arrival. Their initial VS showed BP 185/81 and P100, pulse ox 96%. Most recent VS with BP 156/121, pulse 95, Pulse ox 96%. She is still complaining of significant pain and states she's never felt like this before. She got of fentanyl in the ED. Denies hx of bleeding, stroke, TIA. She is allergic to PCN, Sulfa drugs and codeine. She recalls being allergic to something else but she doesn't recall what it is - she denies ever being allergic to contrast dye. She denies any fever, chills, syncope, palpitations or sick contacts. She got her J&J vaccine 3 weeks ago.   Of note she was initially listed as a "Terry Hanson" in the ED before chart was merged to this one. She also has an old chart MRN with an ED visit from 05/2018 with dizziness of unclear  cause, able to discharged from the ED. CBC and Cr were normal at that time and EKG had shown sinus bradycardia.  Hospital Course     1. STEMI: s/p large anterior STEMI treated with PCI drug-eluting stenting via the femoral approach by Dr. Katrinka Blazing with a 34 mm stent postdilated to 3 mm.  She has residual disease in the remaining vessels which will be treated  medically.  hsTn peaked >27000 x3.  -- on DAPT with aspirin and Brilinta. Has had some trouble with shortness of breath but suspect related to Brilinta. She is willing to continue for now and address at follow up if symptoms are not improving.    2. Ischemic cardiomyopathy: EF in the 30 to 35% range with anterior wall motion abnormalities consistent with her anatomy.  Was started on low-dose carvedilol with blood pressures tolerating.  -- this was increased to coreg 12.5mg  BID prior to discharge. Hold on starting ACE/ARB/spiro/Entresto with elevated Cr. Consider adding in follow up if renal function is stable.  -- given her anterior infarct and EF of 30% will plan for lifevest at discharge.   3. Hyperlipidemia: on simvastatin as an outpatient, LDL of 192. Switched to high dose atorva -- will need FLP/LFTs in 8 weeks   4. Non-insulin-requiring diabetes: on Metformin at home, resumed at discharge.  Was treated with SSI while inpatient. -- Hgb A1c 6.5   Terry Hanson was seen by Dr. Allyson Sabal and determined stable for discharge home. Follow up in the office has been arranged. Medications are listed below.   _____________  Discharge Vitals Blood pressure 110/60, pulse 78, temperature 98.3 F (36.8 C), temperature source Oral, resp. rate 20, height 5\' 5"  (1.651 m), weight 97.7 kg, SpO2 99 %.  Filed Weights   06/29/19 2151 07/01/19 0346 07/02/19 0528  Weight: 99 kg 98.5 kg 97.7 kg    Labs & Radiologic Studies    CBC Recent Labs    06/29/19 1953 06/29/19 1953 06/29/19 2027 06/30/19 0150  WBC 14.3*  --   --  21.7*  NEUTROABS 11.2*  --   --   --   HGB 14.4   < > 14.6 14.0  HCT 44.6   < > 43.0 42.1  MCV 94.7  --   --  93.1  PLT 249  --   --  261   < > = values in this interval not displayed.   Basic Metabolic Panel Recent Labs    07/02/19 2328 06/30/19 0150  NA 137 135  K 5.7* 4.7  CL 98 99  CO2 27 22  GLUCOSE 292* 282*  BUN 15 15  CREATININE 1.43* 1.37*  CALCIUM 9.4 9.2   Liver  Function Tests Recent Labs    06/29/19 1953 06/30/19 0150  AST 25 450*  ALT 20 82*  ALKPHOS 29* 26*  BILITOT 0.7 0.7  PROT 7.5 6.7  ALBUMIN 3.9 3.4*   No results for input(s): LIPASE, AMYLASE in the last 72 hours. Cardiac Enzymes No results for input(s): CKTOTAL, CKMB, CKMBINDEX, TROPONINI in the last 72 hours. BNP Invalid input(s): POCBNP D-Dimer No results for input(s): DDIMER in the last 72 hours. Hemoglobin A1C Recent Labs    07/01/19 0236  HGBA1C 6.5*   Fasting Lipid Panel Recent Labs    06/29/19 2218  CHOL 266*  HDL 51  LDLCALC 192*  TRIG 117  CHOLHDL 5.2   Thyroid Function Tests Recent Labs    06/29/19 2218  TSH 2.728   _____________  CARDIAC CATHETERIZATION  Result Date: 06/29/2019  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  99% stenosis/thrombotic with TIMI I flow reduced to 0% with TIMI grade III flow following stent implantation using a 32 x 2.5 mm diameter onyx postdilated to 3.0 mm throughout the proximal two thirds of the stent.  The LAD is otherwise widely patent.  Less than 20% proximal left main  Small first obtuse marginal with proximal 70% narrowing.  Mid to distal circumflex with 60% narrowing.  Right coronary arises somewhat anterior.  There is 40 to 50% proximal/ostial narrowing.  There is 70% hazy eccentric mid RCA stenosis.  LVEDP 40, consistent with acute systolic heart failure.  Prior status of the left ventricle was not known. RECOMMENDATIONS:  Management of acute presumed systolic heart failure: BiPAP; IV Lasix; Foley catheter; IV nitroglycerin 10-100 mics to keep systolic blood pressure 045 to 120 mmHg.  High intensity statin therapy  Once volume overload has resolved, beta-blocker and angiotensin receptor/ACE/Arni therapy should be considered  2D Doppler echocardiogram to assess LV function in a.m.  Medical therapy of right coronary and circumflex disease.  Aggressive risk factor  modification.  DG Chest Port 1 View  Result Date: 06/29/2019 CLINICAL DATA:  Chest pain EXAM: PORTABLE CHEST 1 VIEW COMPARISON:  05/27/2018 FINDINGS: Hazy opacities throughout the left lung. Slight parahilar opacity in the right lung. Cardiomediastinal contours are normal. No pleural effusion or pneumothorax. IMPRESSION: Left-greater-than-right pulmonary opacities favored to indicate pulmonary edema. Electronically Signed   By: Ulyses Jarred M.D.   On: 06/29/2019 22:15   ECHOCARDIOGRAM COMPLETE  Result Date: 06/30/2019    ECHOCARDIOGRAM REPORT   Patient Name:   Terry Hanson  Date of Exam: 06/30/2019 Medical Rec #:  [id number]  Height:       65.0 in Accession #:    [id number] Weight:       218.3 lb Date of Birth:  [birth year]   BSA:          2.053 m Patient Age:    56 years   BP:           117/64 mmHg Patient Gender: F          HR:           92 bpm. Exam Location:  Inpatient Procedure: 2D Echo, Cardiac Doppler and Color Doppler Indications:    I50.23 Acute on chronic systolic (congestive) heart failure  History:        Patient has no prior history of Echocardiogram examinations.                 Cardiomegaly; Risk Factors:Hypertension and Diabetes.  Sonographer:    Jonelle Sidle Dance Referring Phys: Clayton  Sonographer Comments: Suboptimal subcostal window. IMPRESSIONS  1. Left ventricular ejection fraction, by estimation, is 30 to 35%. The left ventricle has moderately decreased function. The left ventricle demonstrates regional wall motion abnormalities (see scoring diagram/findings for description). There is moderate left ventricular hypertrophy. Left ventricular diastolic parameters are consistent with Grade I diastolic dysfunction (impaired relaxation). There is mild dyskinesis of the left ventricular, apex. There is severe hypokinesis of the left ventricular, mid-apical anterior wall, anteroseptal wall, inferior wall and inferoseptal wall. No intracavitary thrombus is seen, but images are suboptimal.  2.  Right ventricular systolic function is normal. The right ventricular size is normal. There is mildly elevated pulmonary artery systolic pressure. The estimated right ventricular systolic pressure is 86.5 mmHg.  3. Left atrial size was mildly dilated.  4.  The mitral valve is normal in structure. No evidence of mitral valve regurgitation.  5. Aortic valve gradients are underestimated due to poor left ventricular systolic function. The aortic valve is tricuspid. Aortic valve regurgitation is not visualized. Mild aortic valve stenosis.  6. The inferior vena cava is dilated in size with <50% respiratory variability, suggesting right atrial pressure of 15 mmHg. FINDINGS  Left Ventricle: Left ventricular ejection fraction, by estimation, is 30 to 35%. The left ventricle has moderately decreased function. The left ventricle demonstrates regional wall motion abnormalities. Mild dyskinesis of the left ventricular, entire apical segment. Severe hypokinesis of the left ventricular, mid-apical anterior wall, anteroseptal wall, inferior wall and inferoseptal wall. The left ventricular internal cavity size was normal in size. There is moderate left ventricular hypertrophy. Left ventricular diastolic parameters are consistent with Grade I diastolic dysfunction (impaired relaxation). Normal left ventricular filling pressure. Right Ventricle: The right ventricular size is normal. No increase in right ventricular wall thickness. Right ventricular systolic function is normal. There is mildly elevated pulmonary artery systolic pressure. The tricuspid regurgitant velocity is 2.30  m/s, and with an assumed right atrial pressure of 15 mmHg, the estimated right ventricular systolic pressure is 36.2 mmHg. Left Atrium: Left atrial size was mildly dilated. Right Atrium: Right atrial size was normal in size. Pericardium: There is no evidence of pericardial effusion. Mitral Valve: The mitral valve is normal in structure. Mild mitral annular  calcification. No evidence of mitral valve regurgitation. Tricuspid Valve: The tricuspid valve is normal in structure. Tricuspid valve regurgitation is trivial. Aortic Valve: Aortic valve gradients are underestimated due to poor left ventricular systolic function. The aortic valve is tricuspid. Aortic valve regurgitation is not visualized. Mild aortic stenosis is present. Aortic valve mean gradient measures 7.7 mmHg. Aortic valve peak gradient measures 15.3 mmHg. Dimensionless index is 0.43. Pulmonic Valve: The pulmonic valve was not well visualized. Pulmonic valve regurgitation is not visualized. Aorta: The aortic root and ascending aorta are structurally normal, with no evidence of dilitation. Venous: The inferior vena cava is dilated in size with less than 50% respiratory variability, suggesting right atrial pressure of 15 mmHg. IAS/Shunts: No atrial level shunt detected by color flow Doppler.  LEFT VENTRICLE PLAX 2D LVIDd:         2.70 cm  Diastology LVIDs:         2.10 cm  LV e' lateral:   5.00 cm/s LV PW:         1.70 cm  LV E/e' lateral: 10.4 LV IVS:        1.40 cm  LV e' medial:    3.81 cm/s LVOT diam:     1.90 cm  LV E/e' medial:  13.6 LV SV:         51 LV SV Index:   25 LVOT Area:     2.84 cm  RIGHT VENTRICLE             IVC RV Basal diam:  2.10 cm     IVC diam: 2.00 cm RV S prime:     12.50 cm/s TAPSE (M-mode): 2.0 cm LEFT ATRIUM           Index       RIGHT ATRIUM          Index LA diam:      3.70 cm 1.80 cm/m  RA Area:     9.22 cm LA Vol (A2C): 54.6 ml 26.60 ml/m RA Volume:   18.10 ml 8.82 ml/m LA Vol (  A4C): 44.5 ml 21.68 ml/m  AORTIC VALVE AV Area (Vmax): 1.23 cm AV Vmax:        195.46 cm/s AV Peak Grad:   15.3 mmHg AV Mean Grad:   7.7 mmHg LVOT Vmax:      85.00 cm/s LVOT Vmean:     56.300 cm/s LVOT VTI:       0.180 m  AORTA Ao Root diam: 3.00 cm Ao Asc diam:  3.40 cm MITRAL VALVE                TRICUSPID VALVE MV Area (PHT): 3.99 cm     TR Peak grad:   21.2 mmHg MV Decel Time: 190 msec     TR  Vmax:        230.00 cm/s MV E velocity: 52.00 cm/s MV A velocity: 106.00 cm/s  SHUNTS MV E/A ratio:  0.49         Systemic VTI:  0.18 m                             Systemic Diam: 1.90 cm Rachelle Hora Croitoru MD Electronically signed by Thurmon Fair MD Signature Date/Time: 06/30/2019/9:21:52 AM    Final    Disposition   Pt is being discharged home today in good condition.  Follow-up Plans & Appointments    Follow-up Information     [place], Sharrell Ku, [place] Follow up on 07/15/2019.   Specialty: Cardiology Why: at 9:15am for your follow up appt Contact information: [address] [place] [address] [phone]           Discharge Instructions     (HEART FAILURE PATIENTS) Call MD:  Anytime you have any of the following symptoms: 1) 3 pound weight gain in 24 hours or 5 pounds in 1 week 2) shortness of breath, with or without a dry hacking cough 3) swelling in the hands, feet or stomach 4) if you have to sleep on extra pillows at night in order to breathe.   Complete by: As directed    Amb Referral to Cardiac Rehabilitation   Complete by: As directed    Diagnosis:  STEMI Coronary Stents     After initial evaluation and assessments completed: Virtual Based Care may be provided alone or in conjunction with Phase 2 Cardiac Rehab based on patient barriers.: Yes   Call MD for:  redness, tenderness, or signs of infection (pain, swelling, redness, odor or green/yellow discharge around incision site)   Complete by: As directed    Diet - low sodium heart healthy   Complete by: As directed    Discharge instructions   Complete by: As directed    Radial Site Care Refer to this sheet in the next few weeks. These instructions provide you with information on caring for yourself after your procedure. Your caregiver may also give you more specific instructions. Your treatment has been planned according to current medical practices, but problems sometimes occur. Call your caregiver if you  have any problems or questions after your procedure. HOME CARE INSTRUCTIONS You may shower the day after the procedure. Remove the bandage (dressing) and gently wash the site with plain soap and water. Gently pat the site dry.  Do not apply powder or lotion to the site.  Do not submerge the affected site in water for 3 to 5 days.  Inspect the site at least twice daily.  Do not flex or bend the affected arm for  24 hours.  No lifting over 5 pounds (2.3 kg) for 5 days after your procedure.  Do not drive home if you are discharged the same day of the procedure. Have someone else drive you.  You may drive 24 hours after the procedure unless otherwise instructed by your caregiver.  What to expect: Any bruising will usually fade within 1 to 2 weeks.  Blood that collects in the tissue (hematoma) may be painful to the touch. It should usually decrease in size and tenderness within 1 to 2 weeks.  SEEK IMMEDIATE MEDICAL CARE IF: You have unusual pain at the radial site.  You have redness, warmth, swelling, or pain at the radial site.  You have drainage (other than a small amount of blood on the dressing).  You have chills.  You have a fever or persistent symptoms for more than 72 hours.  You have a fever and your symptoms suddenly get worse.  Your arm becomes pale, cool, tingly, or numb.  You have heavy bleeding from the site. Hold pressure on the site.   PLEASE DO NOT MISS ANY DOSES OF YOUR BRILINTA!!!!! Also keep a log of you blood pressures and bring back to your follow up appt. Please call the office with any questions.   Patients taking blood thinners should generally stay away from medicines like ibuprofen, Advil, Motrin, naproxen, and Aleve due to risk of stomach bleeding. You may take Tylenol as directed or talk to your primary doctor about alternatives.  No driving until seen back in the office as you will be wearing lifevest at discharge!!!!   Increase activity slowly   Complete by: As  directed         Discharge Medications     Medication List     STOP taking these medications    simvastatin 40 MG tablet Commonly known as: ZOCOR       TAKE these medications    aspirin 81 MG chewable tablet Chew 1 tablet (81 mg total) by mouth daily.   carvedilol 12.5 MG tablet Commonly known as: COREG Take 1 tablet (12.5 mg total) by mouth 2 (two) times daily with a meal.   metFORMIN 500 MG tablet Commonly known as: GLUCOPHAGE Take 500 mg by mouth 2 (two) times daily.   nitroGLYCERIN 0.4 MG SL tablet Commonly known as: Nitrostat Place 1 tablet (0.4 mg total) under the tongue every 5 (five) minutes as needed.   rosuvastatin 40 MG tablet Commonly known as: CRESTOR Take 1 tablet (40 mg total) by mouth daily.   ticagrelor 90 MG Tabs tablet Commonly known as: BRILINTA Take 1 tablet (90 mg total) by mouth 2 (two) times daily.         Yes                               AHA/ACC Clinical Performance & Quality Measures: Aspirin prescribed? - Yes ADP Receptor Inhibitor (Plavix/Clopidogrel, Brilinta/Ticagrelor or Effient/Prasugrel) prescribed (includes medically managed patients)? - Yes Beta Blocker prescribed? - Yes High Intensity Statin (Lipitor 40-80mg  or Crestor 20-40mg ) prescribed? - Yes EF assessed during THIS hospitalization? - Yes For EF <40%, was ACEI/ARB prescribed? - No - Reason:  elevated Cr For EF <40%, Aldosterone Antagonist (Spironolactone or Eplerenone) prescribed? - No - Reason:  elevated Cr Cardiac Rehab Phase II ordered (Included Medically managed Patients)? - Yes  Outstanding Labs/Studies   FLP/LFTs in 8 weeks. BMET at follow up appt.  Duration of  Discharge Encounter   Greater than 30 minutes including physician time.  Signed, Laverda PageLindsay Roberts NP-C 07/02/2019, 2:17 PM   Agree with note by Laverda PageLindsay Roberts NP-C  Pt admitted with Anterior STEMI Rx with PCI/ES prox/mid LAD. Mod Severe LV Dysf. On GDOMR. Pt was progressed nicely.  Ambulating with minimal SOB. No CP. Exam benign. Given LVD and Ant STEMI will send home with LifeVest. TOC7 then ROV with Dr Katrinka BlazingSmith to continue to titrate meds. 2D echo in 3 months.   Runell GessJonathan J. Jerelene Salaam, M.D., FACP, [facility] Wes[facility] Memorial Medical CenterFACC, Earl LagosFAHA, [facility] LLPFSCAI [facility] [address] [place], [place]  [id number]  [phone] 07/03/2019 6:56 AM

## 2019-07-07 ENCOUNTER — Encounter (HOSPITAL_COMMUNITY): Payer: Self-pay

## 2019-07-08 ENCOUNTER — Other Ambulatory Visit: Payer: Self-pay

## 2019-07-08 ENCOUNTER — Encounter (HOSPITAL_COMMUNITY): Payer: Self-pay | Admitting: Emergency Medicine

## 2019-07-08 ENCOUNTER — Inpatient Hospital Stay (HOSPITAL_COMMUNITY)
Admission: EM | Admit: 2019-07-08 | Discharge: 2019-07-10 | DRG: 291 | Disposition: A | Discharge status: Home Health | Payer: Medicare Other | Attending: Internal Medicine | Admitting: Internal Medicine

## 2019-07-08 ENCOUNTER — Emergency Department (HOSPITAL_COMMUNITY): Payer: Medicare Other

## 2019-07-08 ENCOUNTER — Telehealth: Payer: Self-pay | Admitting: Cardiology

## 2019-07-08 DIAGNOSIS — I248 Other forms of acute ischemic heart disease: Secondary | ICD-10-CM

## 2019-07-08 DIAGNOSIS — Z6837 Body mass index (BMI) 37.0-37.9, adult: Secondary | ICD-10-CM

## 2019-07-08 DIAGNOSIS — R0602 Shortness of breath: Secondary | ICD-10-CM

## 2019-07-08 DIAGNOSIS — I5023 Acute on chronic systolic (congestive) heart failure: Secondary | ICD-10-CM | POA: Diagnosis present

## 2019-07-08 DIAGNOSIS — E1169 Type 2 diabetes mellitus with other specified complication: Secondary | ICD-10-CM

## 2019-07-08 DIAGNOSIS — I10 Essential (primary) hypertension: Secondary | ICD-10-CM

## 2019-07-08 DIAGNOSIS — I5043 Acute on chronic combined systolic (congestive) and diastolic (congestive) heart failure: Secondary | ICD-10-CM | POA: Diagnosis not present

## 2019-07-08 DIAGNOSIS — Z91128 Patient's intentional underdosing of medication regimen for other reason: Secondary | ICD-10-CM

## 2019-07-08 DIAGNOSIS — I5022 Chronic systolic (congestive) heart failure: Secondary | ICD-10-CM | POA: Diagnosis present

## 2019-07-08 DIAGNOSIS — J45909 Unspecified asthma, uncomplicated: Secondary | ICD-10-CM | POA: Diagnosis present

## 2019-07-08 DIAGNOSIS — I2102 ST elevation (STEMI) myocardial infarction involving left anterior descending coronary artery: Secondary | ICD-10-CM

## 2019-07-08 DIAGNOSIS — N179 Acute kidney failure, unspecified: Secondary | ICD-10-CM | POA: Diagnosis present

## 2019-07-08 DIAGNOSIS — E876 Hypokalemia: Secondary | ICD-10-CM | POA: Diagnosis not present

## 2019-07-08 DIAGNOSIS — E119 Type 2 diabetes mellitus without complications: Secondary | ICD-10-CM

## 2019-07-08 DIAGNOSIS — I2510 Atherosclerotic heart disease of native coronary artery without angina pectoris: Secondary | ICD-10-CM | POA: Diagnosis present

## 2019-07-08 DIAGNOSIS — I255 Ischemic cardiomyopathy: Secondary | ICD-10-CM | POA: Diagnosis present

## 2019-07-08 DIAGNOSIS — I509 Heart failure, unspecified: Secondary | ICD-10-CM

## 2019-07-08 DIAGNOSIS — N1832 Chronic kidney disease, stage 3b: Secondary | ICD-10-CM | POA: Diagnosis present

## 2019-07-08 DIAGNOSIS — I252 Old myocardial infarction: Secondary | ICD-10-CM

## 2019-07-08 DIAGNOSIS — Z90710 Acquired absence of both cervix and uterus: Secondary | ICD-10-CM

## 2019-07-08 DIAGNOSIS — E1122 Type 2 diabetes mellitus with diabetic chronic kidney disease: Secondary | ICD-10-CM | POA: Diagnosis present

## 2019-07-08 DIAGNOSIS — Z885 Allergy status to narcotic agent status: Secondary | ICD-10-CM

## 2019-07-08 DIAGNOSIS — Z882 Allergy status to sulfonamides status: Secondary | ICD-10-CM

## 2019-07-08 DIAGNOSIS — Z8249 Family history of ischemic heart disease and other diseases of the circulatory system: Secondary | ICD-10-CM

## 2019-07-08 DIAGNOSIS — T501X6A Underdosing of loop [high-ceiling] diuretics, initial encounter: Secondary | ICD-10-CM | POA: Diagnosis present

## 2019-07-08 DIAGNOSIS — Z7984 Long term (current) use of oral hypoglycemic drugs: Secondary | ICD-10-CM

## 2019-07-08 DIAGNOSIS — I130 Hypertensive heart and chronic kidney disease with heart failure and stage 1 through stage 4 chronic kidney disease, or unspecified chronic kidney disease: Secondary | ICD-10-CM | POA: Diagnosis not present

## 2019-07-08 DIAGNOSIS — Z79899 Other long term (current) drug therapy: Secondary | ICD-10-CM

## 2019-07-08 DIAGNOSIS — I213 ST elevation (STEMI) myocardial infarction of unspecified site: Secondary | ICD-10-CM | POA: Diagnosis present

## 2019-07-08 DIAGNOSIS — Z955 Presence of coronary angioplasty implant and graft: Secondary | ICD-10-CM

## 2019-07-08 DIAGNOSIS — Z6835 Body mass index (BMI) 35.0-35.9, adult: Secondary | ICD-10-CM

## 2019-07-08 DIAGNOSIS — J9601 Acute respiratory failure with hypoxia: Secondary | ICD-10-CM | POA: Diagnosis present

## 2019-07-08 DIAGNOSIS — I5042 Chronic combined systolic (congestive) and diastolic (congestive) heart failure: Secondary | ICD-10-CM | POA: Diagnosis present

## 2019-07-08 DIAGNOSIS — Z880 Allergy status to penicillin: Secondary | ICD-10-CM

## 2019-07-08 DIAGNOSIS — D72829 Elevated white blood cell count, unspecified: Secondary | ICD-10-CM | POA: Diagnosis present

## 2019-07-08 DIAGNOSIS — E7800 Pure hypercholesterolemia, unspecified: Secondary | ICD-10-CM | POA: Diagnosis present

## 2019-07-08 DIAGNOSIS — E669 Obesity, unspecified: Secondary | ICD-10-CM | POA: Diagnosis present

## 2019-07-08 DIAGNOSIS — E785 Hyperlipidemia, unspecified: Secondary | ICD-10-CM | POA: Diagnosis present

## 2019-07-08 DIAGNOSIS — Z7902 Long term (current) use of antithrombotics/antiplatelets: Secondary | ICD-10-CM

## 2019-07-08 DIAGNOSIS — Z7982 Long term (current) use of aspirin: Secondary | ICD-10-CM

## 2019-07-08 HISTORY — DX: ST elevation (STEMI) myocardial infarction of unspecified site: I21.3

## 2019-07-08 HISTORY — DX: Ischemic cardiomyopathy: I25.5

## 2019-07-08 LAB — FERRITIN: Ferritin: 262 ng/mL (ref 11–307)

## 2019-07-08 LAB — IRON AND TIBC
Iron: 23 ug/dL — ABNORMAL LOW (ref 28–170)
Saturation Ratios: 8 % — ABNORMAL LOW (ref 10.4–31.8)
TIBC: 290 ug/dL (ref 250–450)
UIBC: 267 ug/dL

## 2019-07-08 LAB — URINALYSIS, COMPLETE (UACMP) WITH MICROSCOPIC
Bilirubin Urine: NEGATIVE
Glucose, UA: NEGATIVE mg/dL
Ketones, ur: NEGATIVE mg/dL
Leukocytes,Ua: NEGATIVE
Nitrite: NEGATIVE
Protein, ur: 100 mg/dL — AB
Specific Gravity, Urine: 1.019 (ref 1.005–1.030)
pH: 5 (ref 5.0–8.0)

## 2019-07-08 LAB — GLUCOSE, CAPILLARY
Glucose-Capillary: 130 mg/dL — ABNORMAL HIGH (ref 70–99)
Glucose-Capillary: 117 mg/dL — ABNORMAL HIGH (ref 70–99)
Glucose-Capillary: 143 mg/dL — ABNORMAL HIGH (ref 70–99)

## 2019-07-08 LAB — CBG MONITORING, ED: Glucose-Capillary: 126 mg/dL — ABNORMAL HIGH (ref 70–99)

## 2019-07-08 LAB — BRAIN NATRIURETIC PEPTIDE: B Natriuretic Peptide: 1020 pg/mL — ABNORMAL HIGH (ref 0.0–100.0)

## 2019-07-08 LAB — CBC WITH DIFFERENTIAL/PLATELET
Abs Immature Granulocytes: 0.31 10*3/uL — ABNORMAL HIGH (ref 0.00–0.07)
Basophils Absolute: 0.1 10*3/uL (ref 0.0–0.1)
Basophils Relative: 0 %
Eosinophils Absolute: 0.1 10*3/uL (ref 0.0–0.5)
Eosinophils Relative: 1 %
HCT: 34 % — ABNORMAL LOW (ref 36.0–46.0)
Hemoglobin: 11.1 g/dL — ABNORMAL LOW (ref 12.0–15.0)
Immature Granulocytes: 2 %
Lymphocytes Relative: 11 %
Lymphs Abs: 1.6 10*3/uL (ref 0.7–4.0)
MCH: 31.1 pg (ref 26.0–34.0)
MCHC: 32.6 g/dL (ref 30.0–36.0)
MCV: 95.2 fL (ref 80.0–100.0)
Monocytes Absolute: 1.5 10*3/uL — ABNORMAL HIGH (ref 0.1–1.0)
Monocytes Relative: 10 %
Neutro Abs: 11.4 10*3/uL — ABNORMAL HIGH (ref 1.7–7.7)
Neutrophils Relative %: 76 %
Platelets: 342 10*3/uL (ref 150–400)
RBC: 3.57 MIL/uL — ABNORMAL LOW (ref 3.87–5.11)
RDW: 13.7 % (ref 11.5–15.5)
WBC: 14.8 10*3/uL — ABNORMAL HIGH (ref 4.0–10.5)
nRBC: 0.2 % (ref 0.0–0.2)

## 2019-07-08 LAB — COMPREHENSIVE METABOLIC PANEL
ALT: 57 U/L — ABNORMAL HIGH (ref 0–44)
AST: 41 U/L (ref 15–41)
Albumin: 3.4 g/dL — ABNORMAL LOW (ref 3.5–5.0)
Alkaline Phosphatase: 43 U/L (ref 38–126)
Anion gap: 10 (ref 5–15)
BUN: 27 mg/dL — ABNORMAL HIGH (ref 8–23)
CO2: 23 mmol/L (ref 22–32)
Calcium: 8.7 mg/dL — ABNORMAL LOW (ref 8.9–10.3)
Chloride: 96 mmol/L — ABNORMAL LOW (ref 98–111)
Creatinine, Ser: 1.41 mg/dL — ABNORMAL HIGH (ref 0.44–1.00)
GFR calc Af Amer: 42 mL/min — ABNORMAL LOW (ref >60)
GFR calc non Af Amer: 36 mL/min — ABNORMAL LOW (ref >60)
Glucose, Bld: 167 mg/dL — ABNORMAL HIGH (ref 70–99)
Potassium: 3.9 mmol/L (ref 3.5–5.1)
Sodium: 129 mmol/L — ABNORMAL LOW (ref 135–145)
Total Bilirubin: 1.6 mg/dL — ABNORMAL HIGH (ref 0.3–1.2)
Total Protein: 7.5 g/dL (ref 6.5–8.1)

## 2019-07-08 LAB — TROPONIN I (HIGH SENSITIVITY)
Troponin I (High Sensitivity): 2827 ng/L (ref <18)
Troponin I (High Sensitivity): 2362 ng/L (ref <18)

## 2019-07-08 LAB — VITAMIN B12: Vitamin B-12: 144 pg/mL — ABNORMAL LOW (ref 180–914)

## 2019-07-08 LAB — FOLATE: Folate: 7 ng/mL (ref >5.9)

## 2019-07-08 MED ORDER — ACETAMINOPHEN 325 MG PO TABS
650.0000 mg | ORAL_TABLET | Freq: Four times a day (QID) | ORAL | Status: DC | PRN
  Administered 2019-07-08: 17:00:00 650 mg via ORAL
  Filled 2019-07-08: qty 2

## 2019-07-08 MED ORDER — FUROSEMIDE 10 MG/ML IJ SOLN
60.0000 mg | Freq: Once | INTRAMUSCULAR | Status: AC
  Administered 2019-07-08: 60 mg via INTRAVENOUS
  Filled 2019-07-08: qty 6

## 2019-07-08 MED ORDER — ONDANSETRON HCL 4 MG PO TABS: 4.0000 mg | ORAL_TABLET | Freq: Four times a day (QID) | ORAL | Status: DC | PRN

## 2019-07-08 MED ORDER — CARVEDILOL 3.125 MG PO TABS
3.1250 mg | ORAL_TABLET | Freq: Two times a day (BID) | ORAL | Status: DC
  Filled 2019-07-08 (×7): qty 1

## 2019-07-08 MED ORDER — TICAGRELOR 90 MG PO TABS
90.0000 mg | ORAL_TABLET | Freq: Two times a day (BID) | ORAL | Status: DC
  Administered 2019-07-08 – 2019-07-10 (×5): 90 mg via ORAL
  Filled 2019-07-08 (×5): qty 1

## 2019-07-08 MED ORDER — METFORMIN HCL 500 MG PO TABS: 500.0000 mg | ORAL_TABLET | Freq: Every day | ORAL | Status: DC

## 2019-07-08 MED ORDER — LISINOPRIL 5 MG PO TABS
2.5000 mg | ORAL_TABLET | Freq: Every day | ORAL | Status: DC
  Administered 2019-07-08 – 2019-07-10 (×3): 2.5 mg via ORAL
  Filled 2019-07-08 (×3): qty 1

## 2019-07-08 MED ORDER — ROSUVASTATIN CALCIUM 20 MG PO TABS
40.0000 mg | ORAL_TABLET | Freq: Every day | ORAL | Status: DC
  Administered 2019-07-09 – 2019-07-10 (×2): 40 mg via ORAL
  Filled 2019-07-08 (×2): qty 1
  Filled 2019-07-08 (×2): qty 2
  Filled 2019-07-08: qty 1

## 2019-07-08 MED ORDER — NITROGLYCERIN 0.4 MG SL SUBL: 0.4000 mg | SUBLINGUAL_TABLET | SUBLINGUAL | Status: DC | PRN

## 2019-07-08 MED ORDER — FUROSEMIDE 10 MG/ML IJ SOLN
40.0000 mg | Freq: Two times a day (BID) | INTRAMUSCULAR | Status: DC
  Administered 2019-07-08 – 2019-07-09 (×3): 40 mg via INTRAVENOUS
  Filled 2019-07-08 (×3): qty 4

## 2019-07-08 MED ORDER — NITROGLYCERIN 2 % TD OINT
0.5000 [in_us] | TOPICAL_OINTMENT | Freq: Once | TRANSDERMAL | Status: AC
  Administered 2019-07-08: 0.5 [in_us] via TOPICAL
  Filled 2019-07-08: qty 1

## 2019-07-08 MED ORDER — INSULIN ASPART 100 UNIT/ML ~~LOC~~ SOLN
0.0000 [IU] | Freq: Three times a day (TID) | SUBCUTANEOUS | Status: DC
  Administered 2019-07-08 (×2): 2 [IU] via SUBCUTANEOUS
  Administered 2019-07-09: 3 [IU] via SUBCUTANEOUS
  Filled 2019-07-08: qty 1

## 2019-07-08 MED ORDER — ACETAMINOPHEN 650 MG RE SUPP: 650.0000 mg | Freq: Four times a day (QID) | RECTAL | Status: DC | PRN

## 2019-07-08 MED ORDER — ASPIRIN 81 MG PO CHEW
81.0000 mg | CHEWABLE_TABLET | Freq: Every day | ORAL | Status: DC
  Administered 2019-07-08 – 2019-07-10 (×3): 81 mg via ORAL
  Filled 2019-07-08 (×3): qty 1

## 2019-07-08 MED ORDER — ONDANSETRON HCL 4 MG/2ML IJ SOLN: 4.0000 mg | Freq: Four times a day (QID) | INTRAMUSCULAR | Status: DC | PRN

## 2019-07-08 NOTE — H&P (Signed)
History and Physical    Terry Hanson WPY:[id number] DOB: 05/04/1940 DOA: 07/08/2019  PCP: Lucia Gaskins, MD   Patient coming from: Home.  I have personally briefly reviewed patient's old medical records in [place]  Chief Complaint: Shortness of breath.  HPI: Terry Hanson is a 79 y.o. female with medical history significant of osteoarthritis, asthma, type 2 diabetes, cardiomegaly, hyperlipidemia, CAD who was admitted on 06/29/2019 to [facility] due to acute pulmonary edema and STEMI undergoing cardiac cath and stent placement (see procedure note below) who is coming to the emergency department due to progressively worse dyspnea associated with lower extremities pitting edema, orthopnea fatigue since she was discharged from the hospital on 07/02/2019.  Her predischarge echo had an EF of 30 to 35%.  The patient has not been taking furosemide at home, but was getting furosemide IV in the hospital.  She denies chest pain, palpitations, dizziness, diaphoresis, fever, chills, sore throat, rhinorrhea, abdominal pain, diarrhea, constipation, melena or hematochezia.  No dysuria, frequency or hematuria.  No polyuria, polydipsia, polyphagia or blurred vision.  ED Course: Initial vital signs temperature 98.7 F, pulse 86, respirations 31, O2 sat 98% and blood pressure 144/74 mmHg.  The patient received 60 mg of furosemide, Nitropaste and supplemental oxygen in the emergency department.  White count is 14.8, hemoglobin 11.1 g/dL and platelets 342.  BNP was 1020 pg/mL.  Troponin was 2827 and then trended down to 2362.  EKG is sinus rhythm with normal lateral Q waves.  Sodium is 129, chloride 96 and all other electrolytes are within normal limits when calcium is corrected to albumin.  Glucose 167, BUN 27 and creatinine 1.41 mg/dL.  Total protein 7.5, albumin 3.4 g/dL.  AST and alkaline phosphatase were normal, but ALT was mildly elevated at 57 units/L.  Total bilirubin was 1.6 mg/dL.  Her chest radiograph shows  mild cardiomegaly and resolve pulmonary edema from previous film earlier this month.  Review of Systems: As per HPI otherwise all other systems reviewed and are negative.  Past Medical History:  Diagnosis Date  . Arthritis   . Asthma   . Diabetes mellitus ([place])   . Diabetes mellitus without complication ([place])   . Enlarged heart   . Hypercholesterolemia   . Hyperlipidemia   . STEMI (ST elevation myocardial infarction) [facility])     Past Surgical History:  Procedure Laterality Date  . ABDOMINAL HYSTERECTOMY    . CORONARY/GRAFT ACUTE MI REVASCULARIZATION N/A 06/29/2019   Procedure: Coronary/Graft Acute MI Revascularization;  Surgeon: Belva Crome, MD;  Location: [place] CV [facility];  Service: Cardiovascular;  Laterality: N/A;  . LEFT HEART CATH AND CORONARY ANGIOGRAPHY N/A 06/29/2019   Procedure: LEFT HEART CATH AND CORONARY ANGIOGRAPHY;  Surgeon: Belva Crome, MD;  Location: [place] [facility];  Service: Cardiovascular;  Laterality: N/A;    Social History  reports that she has never smoked. She has never used smokeless tobacco. She reports previous alcohol use. She reports that she does not use drugs.  Allergies  Allergen Reactions  . Codeine Hives  . Penicillins Hives  . Sulfa Antibiotics Itching  . Codeine Itching and Rash  . Penicillins Itching and Rash    Did it involve swelling of the face/tongue/throat, SOB, or low BP? Yes Did it involve sudden or severe rash/hives, skin peeling, or any reaction on the inside of your mouth or nose? Yes Did you need to seek medical attention at a hospital or doctor's office? Yes When did it last happen?  Over 10 years If all above answers are "NO", may proceed with cephalosporin use.   . Sulfa Antibiotics Itching and Rash    Family History  Problem Relation Age of Onset  . Heart disease Mother    Prior to Admission medications   Medication Sig Start Date End Date Taking? Authorizing Provider  aspirin 81 MG chewable tablet Chew 1  tablet (81 mg total) by mouth daily. 07/02/19   Arty Baumgartner, NP  carvedilol (COREG) 12.5 MG tablet Take 1 tablet (12.5 mg total) by mouth 2 (two) times daily with a meal. 07/02/19   Laverda Page B, NP  metFORMIN (GLUCOPHAGE) 500 MG tablet Take 500 mg by mouth daily.    [provider]  metFORMIN (GLUCOPHAGE) 500 MG tablet Take 500 mg by mouth 2 (two) times daily. 04/14/19   [provider]  nitroGLYCERIN (NITROSTAT) 0.4 MG SL tablet Place 1 tablet (0.4 mg total) under the tongue every 5 (five) minutes as needed. 07/02/19   Arty Baumgartner, NP  rosuvastatin (CRESTOR) 40 MG tablet Take 1 tablet (40 mg total) by mouth daily. 07/02/19   Arty Baumgartner, NP  ticagrelor (BRILINTA) 90 MG TABS tablet Take 1 tablet (90 mg total) by mouth 2 (two) times daily. 07/02/19   Arty Baumgartner, NP    Physical Exam: Vitals:   07/08/19 0245 07/08/19 0300 07/08/19 0330 07/08/19 0400  BP:  118/81 112/61 128/66  Pulse: 75 74 76   Resp: (!) 23 (!) 23 (!) 24 (!) 24  Temp:      SpO2: 99% 96% 98%   Weight:      Height:        Constitutional: NAD, calm, comfortable Eyes: PERRL, lids and conjunctivae normal ENMT: Mucous membranes are moist. Posterior pharynx clear of any exudate or lesions. Neck: normal, supple, no masses, no thyromegaly Respiratory: Mildly tachypneic in the mid twenties.  Bibasilar crackles.. No accessory muscle use.  Cardiovascular: Regular rate and rhythm, no murmurs / rubs / gallops.  2+ lower extremities pitting edema. 2+ pedal pulses. No carotid bruits.  Abdomen: Nondistended, obese.  BS positive, soft, no tenderness, no masses palpated. No hepatosplenomegaly. Musculoskeletal: no clubbing / cyanosis. Good ROM, no contractures. Normal muscle tone.  Skin: no clinically significant rashes, lesions, ulcers on very limited dermatological examination. Neurologic: CN 2-12 grossly intact. Sensation intact, DTR normal. Strength 5/5 in all 4.  Psychiatric: Normal  judgment and insight. Alert and oriented x 3. Normal mood.   Labs on Admission: I have personally reviewed following labs and imaging studies  CBC: Recent Labs  Lab 07/08/19 0204  WBC 14.8*  NEUTROABS 11.4*  HGB 11.1*  HCT 34.0*  MCV 95.2  PLT 342    Basic Metabolic Panel: Recent Labs  Lab 07/08/19 0204  NA 129*  K 3.9  CL 96*  CO2 23  GLUCOSE 167*  BUN 27*  CREATININE 1.41*  CALCIUM 8.7*    GFR: Estimated Creatinine Clearance: 38.7 mL/min (A) (by C-G formula based on SCr of 1.41 mg/dL (H)).  Liver Function Tests: Recent Labs  Lab 07/08/19 0204  AST 41  ALT 57*  ALKPHOS 43  BILITOT 1.6*  PROT 7.5  ALBUMIN 3.4*    Urine analysis:    Component Value Date/Time   COLORURINE YELLOW 05/27/2018 1532   APPEARANCEUR CLEAR 05/27/2018 1532   LABSPEC 1.021 05/27/2018 1532   PHURINE 5.0 05/27/2018 1532   GLUCOSEU NEGATIVE 05/27/2018 1532   HGBUR NEGATIVE 05/27/2018 1532   BILIRUBINUR NEGATIVE 05/27/2018  1532   KETONESUR NEGATIVE 05/27/2018 1532   PROTEINUR NEGATIVE 05/27/2018 1532   NITRITE NEGATIVE 05/27/2018 1532   LEUKOCYTESUR NEGATIVE 05/27/2018 1532    Radiological Exams on Admission: DG Chest Port 1 View  Result Date: 07/08/2019 CLINICAL DATA:  Shortness of breath. Recent myocardial infarction. EXAM: PORTABLE CHEST 1 VIEW COMPARISON:  Radiograph 06/29/2019 FINDINGS: Overlying artifact from life vest/monitoring device. Low lung volumes. Mild cardiomegaly. Prior perihilar opacities have near completely resolved likely pulmonary edema. No pleural fluid, pneumothorax, or confluent airspace disease. IMPRESSION: Mild cardiomegaly. Resolved pulmonary edema from exam earlier this month. Electronically Signed   By: Narda Rutherford M.D.   On: 07/08/2019 02:42   06/30/2019 echocardiogram IMPRESSIONS   1. Left ventricular ejection fraction, by estimation, is 30 to 35%. The  left ventricle has moderately decreased function. The left ventricle  demonstrates  regional wall motion abnormalities (see scoring  diagram/findings for description). There is  moderate left ventricular hypertrophy. Left ventricular diastolic  parameters are consistent with Grade I diastolic dysfunction (impaired  relaxation). There is mild dyskinesis of the left ventricular, apex. There  is severe hypokinesis of the left  ventricular, mid-apical anterior wall, anteroseptal wall, inferior wall  and inferoseptal wall. No intracavitary thrombus is seen, but images are  suboptimal.  2. Right ventricular systolic function is normal. The right ventricular  size is normal. There is mildly elevated pulmonary artery systolic  pressure. The estimated right ventricular systolic pressure is 36.2 mmHg.  3. Left atrial size was mildly dilated.  4. The mitral valve is normal in structure. No evidence of mitral valve  regurgitation.  5. Aortic valve gradients are underestimated due to poor left ventricular  systolic function. The aortic valve is tricuspid. Aortic valve  regurgitation is not visualized. Mild aortic valve stenosis.  6. The inferior vena cava is dilated in size with <50% respiratory  variability, suggesting right atrial pressure of 15 mmHg.  06/29/2019  Coronary/Graft Acute MI Revascularization  LEFT HEART CATH AND CORONARY ANGIOGRAPHY  Conclusion   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  99% stenosis/thrombotic with TIMI I flow reduced to 0% with TIMI grade III flow following stent implantation using a 32 x 2.5 mm diameter onyx postdilated to 3.0 mm throughout the proximal two thirds of the stent.  The LAD is otherwise widely patent.  Less than 20% proximal left main  Small first obtuse marginal with proximal 70% narrowing.  Mid to distal circumflex with 60% narrowing.  Right coronary arises somewhat anterior.  There is 40 to 50% proximal/ostial narrowing.  There is 70% hazy eccentric mid RCA stenosis.  LVEDP 40,  consistent with acute systolic heart failure.  Prior status of the left ventricle was not known.  RECOMMENDATIONS:   Management of acute presumed systolic heart failure: BiPAP; IV Lasix; Foley catheter; IV nitroglycerin 10-100 mics to keep systolic blood pressure 110 to 120 mmHg.  High intensity statin therapy  Once volume overload has resolved, beta-blocker and angiotensin receptor/ACE/Arni therapy should be considered  2D Doppler echocardiogram to assess LV function in a.m.  Medical therapy of right coronary and circumflex disease.  Aggressive risk factor modification.   EKG: Independently reviewed Vent. rate 83 BPM PR interval * ms QRS duration 85 ms QT/QTc 360/423 ms P-R-T axes 65 106 31 Sinus rhythm Abnormal lateral Q waves Since last tracing 27 May 2018 Probable anteroseptal infarct, recent Baseline wander in lead(s) V4  Assessment/Plan Principal Problem:   Acute on chronic combined systolic and  diastolic CHF (congestive heart failure) (HCC) The patient has not been taking furosemide since discharge from [facility]. Observation/telemetry. Continue supplemental oxygen. Fluid and restriction. Furosemide 40 mg IVP twice a day. Start low-dose lisinopril 2.5 mg p.o. daily. Monitor daily weights, intake and output. Follow-up renal function and electrolytes. Consult cardiology.  Active Problems:   CAD Beta-blocker held due to acute decompensation. She is on Crestor and DAPT.    DM II (diabetes mellitus, type II), controlled (HCC) Carbohydrate modified diet. Continue metformin 500 mg p.o. daily. 6.5% was her last hemoglobin A1c on 07/01/2019. CBG monitoring with R ISS.    Hypercholesterolemia On Crestor    S/P STEMI (ST elevation myocardial infarction) (HCC) Status post stent placement. Troponin level is trending down.    Asthma Supplemental oxygen as needed.   Bronchodilators as needed.   DVT prophylaxis: SCDs.  Anemic and on DAPT. Code Status:   Full  code. Family Communication:  Her daughter was present in the ED room. Disposition Plan:   Patient is from:  Home.  Anticipated DC to:  Home.  Anticipated DC date:  07/09/2019.  Anticipated DC barriers: Clinical improvement.  Consults called:  Routine cardiology consult. Admission status:  Observation/telemetry.   Severity of Illness: Moderate severity.  Bobette Mo MD [facility]  How to contact the [facility] Attending or Consulting provider 7A - 7P or covering provider during after hours 7P -7A, for this patient?   1. Check the care team in [facility] and look for a) attending/consulting TRH provider listed and b) the [facility] team listed 2. Log into [email or web address] and use [facility] universal password to access. If you do not have the password, please contact the hospital operator. 3. Locate the [facility] provider you are looking for under [facility] and page to a number that you can be directly reached. 4. If you still have difficulty reaching the provider, please page the [facility] (Director on Call) for the Hospitalists listed on amion for assistance.  07/08/2019, 6:35 AM   This document was prepared using Dragon voice recognition software and may contain some unintended transcription errors.

## 2019-07-08 NOTE — TOC Initial Note (Signed)
Transition of Care [facility]) - Initial/Assessment Note   Patient Details  Name: Terry Hanson MRN: [id number] Date of Birth: November 26, 1940  [facility] CM/SW Contact:    Sherie Don, LCSW Phone Number: 07/08/2019, 5:33 PM  Clinical Narrative: TOC received consult for CHF. CSW spoke with patient and patient's son, Terry Hanson, to complete assessment. Patient is a 79 year old female who is under observation for acute on chronic combined systolic and diastolic CHF. Patient lives alone in a mobile home. She has a shower chair, walker, BSC, and CPAP at home. Patient does not have a history of HH.  Per patient and patient's son, patient received her new diagnosis of CHF last Monday after she had a heart attack. Patient reported she tries to eat healthy and limits her salt intake. Patient does not restrict her fluids, but stated she only drinks "when thirsty." Patient and son reported patient has started doing daily weigh-ins and noticed she was retaining weight even though she does not each much. TOC to follow for needs.  Expected Discharge Plan: Home/Self Care Barriers to Discharge: Continued Medical Work up  Patient Goals and CMS Choice Patient states their goals for this hospitalization and ongoing recovery are:: Return home   Expected Discharge Plan and Services Expected Discharge Plan: Home/Self Care Living arrangements for the past 2 months: Mobile Home                 Prior Living Arrangements/Services Living arrangements for the past 2 months: Mobile Home Lives with:: Self Patient language and need for interpreter reviewed:: Yes Do you feel safe going back to the place where you live?: Yes      Need for Family Participation in Patient Care: No (Comment) Care giver support system in place?: Yes (comment)(Terry Hanson (son))   Criminal Activity/Legal Involvement Pertinent to Current Situation/Hospitalization: No - Comment as needed  Activities of Daily Living Home Assistive  Devices/Equipment: Environmental consultant (specify type), Bedside commode/3-in-1, Shower chair with back, Cane (specify quad or straight) ADL Screening (condition at time of admission) Patient's cognitive ability adequate to safely complete daily activities?: Yes Is the patient deaf or have difficulty hearing?: No Does the patient have difficulty seeing, even when wearing glasses/contacts?: No Does the patient have difficulty concentrating, remembering, or making decisions?: No Patient able to express need for assistance with ADLs?: Yes Does the patient have difficulty dressing or bathing?: No Independently performs ADLs?: Yes (appropriate for developmental age) Does the patient have difficulty walking or climbing stairs?: Yes Weakness of Legs: Both Weakness of Arms/Hands: None  Emotional Assessment Appearance:: Appears stated age Attitude/Demeanor/Rapport: Engaged Affect (typically observed): Accepting Orientation: : Oriented to Self, Oriented to Place, Oriented to  Time, Oriented to Situation Alcohol / Substance Use: Not Applicable Psych Involvement: No (comment)  Admission diagnosis:  SOB (shortness of breath) [R06.02] Acute on chronic combined systolic and diastolic CHF (congestive heart failure) (HCC) [I50.43] Acute congestive heart failure, unspecified heart failure type [facility]) [I50.9] Patient Active Problem List   Diagnosis Date Noted  . Acute on chronic combined systolic and diastolic CHF (congestive heart failure) ([place]) 07/08/2019  . Ischemic cardiomyopathy 07/08/2019  . Hypercholesterolemia   . STEMI (ST elevation myocardial infarction) ([place])   . Asthma   . Acute ST elevation myocardial infarction (STEMI) due to occlusion of left anterior descending (LAD) coronary artery ([place]) 06/29/2019  . Acute systolic heart failure ([place]) 06/29/2019  . DM II (diabetes mellitus, type II), controlled ([place]) 06/29/2019  . Acute ST elevation  myocardial infarction (STEMI) involving left anterior descending  (LAD) coronary artery (HCC) 06/29/2019  . Acute ST elevation myocardial infarction (STEMI) (HCC)    PCP:  [name], MD Pharmacy:   [facility] [phone] - [place], [place] - [address] AT [address] [place] [address] Phone: [phone] Fax: [phone]  [facility] [place], [place] - [address] [place] [address] Phone: [phone] Fax: [phone]  Readmission Risk Interventions No flowsheet data found.

## 2019-07-08 NOTE — ED Provider Notes (Signed)
[facility] EMERGENCY DEPARTMENT Provider Note   CSN: [id number] Arrival date & time: 07/08/19  0132   Time seen 2:05 AM  History Chief Complaint  Patient presents with  . Shortness of Breath    Terry Hanson is a 79 y.o. female.  HPI   Patient was admitted to the hospital from April 12 through the 15th when she presented with an acute ST elevation MI due to occlusion of the left anterior descending artery.  She also was diagnosed with acute systolic heart failure which patient states she has never been told she had.  She states she was on Lasix while in the hospital but she was not discharged home with it.  She was not on Lasix prior to having her heart attack.  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  She also was noted to have a 20% blockage in the proximal left main and a 70% in the first obtuse marginal and a mid to distal circumflex blockage of 60%.  She had a 40 to 50% proximal/ostial narrowing and a 70% hazy eccentric mid RCA stenosis.  Her LVEDP was 40 consistent with acute systolic heart failure.  After the stent was placed they felt like the other blockages could be treated medically.  Patient states before she left the hospital she felt bad and she felt tired and weak.  I also discussed with her that could be just from having a heart attack.  She also started having shortness of breath and that has gotten worse at home to the point tonight her daughter states she could not eat because it made her so short of breath.  She has had a dry cough.  She denies having any chest pain but did have chest pain when she had her MI last week.  She also states she feels like her legs are swelling today and her abdomen started getting tight and bloated this evening.  She denies any fever, nausea, or vomiting.  Patient states she never smoked however her husband used to smoke.  PCP Lucia Gaskins,  MD   Past Medical History:  Diagnosis Date  . Arthritis   . Asthma   . Diabetes mellitus ([place])   . Diabetes mellitus without complication ([place])   . Enlarged heart   . Hypercholesterolemia   . Hyperlipidemia   . STEMI (ST elevation myocardial infarction) [facility])     Patient Active Problem List   Diagnosis Date Noted  . Acute ST elevation myocardial infarction (STEMI) due to occlusion of left anterior descending (LAD) coronary artery ([place]) 06/29/2019  . Acute systolic heart failure ([place]) 06/29/2019  . DM II (diabetes mellitus, type II), controlled ([place]) 06/29/2019  . Acute ST elevation myocardial infarction (STEMI) involving left anterior descending (LAD) coronary artery ([place]) 06/29/2019  . Acute ST elevation myocardial infarction (STEMI) [facility])     Past Surgical History:  Procedure Laterality Date  . ABDOMINAL HYSTERECTOMY    . CORONARY/GRAFT ACUTE MI REVASCULARIZATION N/A 06/29/2019   Procedure: Coronary/Graft Acute MI Revascularization;  Surgeon: Belva Crome, MD;  Location: [place] [facility];  Service: Cardiovascular;  Laterality: N/A;  . LEFT HEART CATH AND CORONARY ANGIOGRAPHY N/A 06/29/2019   Procedure: LEFT HEART CATH AND CORONARY ANGIOGRAPHY;  Surgeon: Belva Crome, MD;  Location: [place] [facility];  Service: Cardiovascular;  Laterality: N/A;     OB History   No  obstetric history on file.     Family History  Problem Relation Age of Onset  . Heart disease Mother     Social History   Tobacco Use  . Smoking status: Never Smoker  . Smokeless tobacco: Never Used  Substance Use Topics  . Alcohol use: Not Currently  . Drug use: Never    Home Medications Prior to Admission medications   Medication Sig Start Date End Date Taking? Authorizing Provider  aspirin 81 MG chewable tablet Chew 1 tablet (81 mg total) by mouth daily. 07/02/19   Arty Baumgartner, NP  carvedilol (COREG) 12.5 MG tablet Take 1 tablet (12.5 mg total) by mouth 2 (two) times daily with a meal.  07/02/19   Laverda Page B, NP  metFORMIN (GLUCOPHAGE) 500 MG tablet Take 500 mg by mouth daily.    [provider]  metFORMIN (GLUCOPHAGE) 500 MG tablet Take 500 mg by mouth 2 (two) times daily. 04/14/19   [provider]  nitroGLYCERIN (NITROSTAT) 0.4 MG SL tablet Place 1 tablet (0.4 mg total) under the tongue every 5 (five) minutes as needed. 07/02/19   Arty Baumgartner, NP  rosuvastatin (CRESTOR) 40 MG tablet Take 1 tablet (40 mg total) by mouth daily. 07/02/19   Arty Baumgartner, NP  simvastatin (ZOCOR) 20 MG tablet Take 20 mg by mouth daily.    [provider]  ticagrelor (BRILINTA) 90 MG TABS tablet Take 1 tablet (90 mg total) by mouth 2 (two) times daily. 07/02/19   Arty Baumgartner, NP    Allergies    Codeine, Penicillins, Sulfa antibiotics, Codeine, Penicillins, and Sulfa antibiotics  Review of Systems   Review of Systems  All other systems reviewed and are negative.   Physical Exam Updated Vital Signs BP 112/61   Pulse 76   Temp 98.7 F (37.1 C)   Resp (!) 24   Ht 5\' 5"  (1.651 m)   Wt 97.7 kg   SpO2 98%   BMI 35.84 kg/m   Physical Exam Vitals and nursing note reviewed.  Constitutional:      Appearance: Normal appearance. She is obese.  HENT:     Head: Normocephalic and atraumatic.     Right Ear: External ear normal.     Left Ear: External ear normal.  Eyes:     Extraocular Movements: Extraocular movements intact.     Conjunctiva/sclera: Conjunctivae normal.     Pupils: Pupils are equal, round, and reactive to light.  Cardiovascular:     Rate and Rhythm: Normal rate and regular rhythm.     Comments: Patient is wearing a LifeVest. Pulmonary:     Effort: Tachypnea present. No accessory muscle usage or prolonged expiration.     Breath sounds: Decreased breath sounds present. No wheezing, rhonchi or rales.  Abdominal:     General: There is distension.     Palpations: Abdomen is soft.     Tenderness: There is no abdominal  tenderness.  Musculoskeletal:        General: Normal range of motion.     Cervical back: Normal range of motion.     Comments: Patient has trace pitting edema of lower extremities  Skin:    General: Skin is warm and dry.     Findings: No rash.  Neurological:     General: No focal deficit present.     Mental Status: She is alert and oriented to person, place, and time.     Cranial Nerves: No cranial nerve deficit.  Psychiatric:  Mood and Affect: Mood normal.        Behavior: Behavior normal.        Thought Content: Thought content normal.     ED Results / Procedures / Treatments   Labs (all labs ordered are listed, but only abnormal results are displayed) Results for orders placed or performed during the hospital encounter of 07/08/19  Comprehensive metabolic panel  Result Value Ref Range   Sodium 129 (L) 135 - 145 mmol/L   Potassium 3.9 3.5 - 5.1 mmol/L   Chloride 96 (L) 98 - 111 mmol/L   CO2 23 22 - 32 mmol/L   Glucose, Bld 167 (H) 70 - 99 mg/dL   BUN 27 (H) 8 - 23 mg/dL   Creatinine, Ser 4.26 (H) 0.44 - 1.00 mg/dL   Calcium 8.7 (L) 8.9 - 10.3 mg/dL   Total Protein 7.5 6.5 - 8.1 g/dL   Albumin 3.4 (L) 3.5 - 5.0 g/dL   AST 41 15 - 41 U/L   ALT 57 (H) 0 - 44 U/L   Alkaline Phosphatase 43 38 - 126 U/L   Total Bilirubin 1.6 (H) 0.3 - 1.2 mg/dL   GFR calc non Af Amer 36 (L) >60 mL/min   GFR calc Af Amer 42 (L) >60 mL/min   Anion gap 10 5 - 15  CBC with Differential  Result Value Ref Range   WBC 14.8 (H) 4.0 - 10.5 K/uL   RBC 3.57 (L) 3.87 - 5.11 MIL/uL   Hemoglobin 11.1 (L) 12.0 - 15.0 g/dL   HCT 83.4 (L) 19.6 - 22.2 %   MCV 95.2 80.0 - 100.0 fL   MCH 31.1 26.0 - 34.0 pg   MCHC 32.6 30.0 - 36.0 g/dL   RDW 97.9 89.2 - 11.9 %   Platelets 342 150 - 400 K/uL   nRBC 0.2 0.0 - 0.2 %   Neutrophils Relative % 76 %   Neutro Abs 11.4 (H) 1.7 - 7.7 K/uL   Lymphocytes Relative 11 %   Lymphs Abs 1.6 0.7 - 4.0 K/uL   Monocytes Relative 10 %   Monocytes Absolute 1.5 (H)  0.1 - 1.0 K/uL   Eosinophils Relative 1 %   Eosinophils Absolute 0.1 0.0 - 0.5 K/uL   Basophils Relative 0 %   Basophils Absolute 0.1 0.0 - 0.1 K/uL   Immature Granulocytes 2 %   Abs Immature Granulocytes 0.31 (H) 0.00 - 0.07 K/uL  Brain natriuretic peptide  Result Value Ref Range   B Natriuretic Peptide 1,020.0 (H) 0.0 - 100.0 pg/mL  Troponin I (High Sensitivity)  Result Value Ref Range   Troponin I (High Sensitivity) 2,827 (HH) <18 ng/L   Laboratory interpretation all normal except improving elevation of her troponin which was over 27,000, hyperglycemia, hyponatremia, marked elevation of BNP compared to last week. Stable anemia, leukocytosis   EKG EKG Interpretation  Date/Time:  Wednesday July 08 2019 01:49:37 EDT Ventricular Rate:  83 PR Interval:    QRS Duration: 85 QT Interval:  360 QTC Calculation: 423 R Axis:   106 Text Interpretation: Sinus rhythm Abnormal lateral Q waves Since last tracing 27 May 2018 Probable anteroseptal infarct, recent Baseline wander in lead(s) V4 Confirmed by Devoria Albe ([id number]) on 07/08/2019 2:00:18 AM   Radiology DG Chest Port 1 View  Result Date: 07/08/2019 CLINICAL DATA:  Shortness of breath. Recent myocardial infarction. EXAM: PORTABLE CHEST 1 VIEW COMPARISON:  Radiograph 06/29/2019 FINDINGS: Overlying artifact from life vest/monitoring device. Low lung volumes. Mild cardiomegaly. Prior perihilar opacities  have near completely resolved likely pulmonary edema. No pleural fluid, pneumothorax, or confluent airspace disease. IMPRESSION: Mild cardiomegaly. Resolved pulmonary edema from exam earlier this month. Electronically Signed   By: Narda RutherfordMelanie  Sanford M.D.   On: 07/08/2019 02:42    Procedures Procedures (including critical care time)  CARDIAC CATHETERIZATION  Result Date: 06/29/2019  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  99% stenosis/thrombotic with TIMI I flow reduced to 0% with TIMI grade III  flow following stent implantation using a 32 x 2.5 mm diameter onyx postdilated to 3.0 mm throughout the proximal two thirds of the stent.  The LAD is otherwise widely patent.  Less than 20% proximal left main  Small first obtuse marginal with proximal 70% narrowing.  Mid to distal circumflex with 60% narrowing.  Right coronary arises somewhat anterior.  There is 40 to 50% proximal/ostial narrowing.  There is 70% hazy eccentric mid RCA stenosis.  LVEDP 40, consistent with acute systolic heart failure.  Prior status of the left ventricle was not known. RECOMMENDATIONS:  Management of acute presumed systolic heart failure: BiPAP; IV Lasix; Foley catheter; IV nitroglycerin 10-100 mics to keep systolic blood pressure 110 to 120 mmHg.  High intensity statin therapy  Once volume overload has resolved, beta-blocker and angiotensin receptor/ACE/Arni therapy should be considered  2D Doppler echocardiogram to assess LV function in a.m.  Medical therapy of right coronary and circumflex disease.  Aggressive risk factor modification.   Echocardiogram 06/30/2019 1. Left ventricular ejection fraction, by estimation, is 30 to 35%. The  left ventricle has moderately decreased function. The left ventricle  demonstrates regional wall motion abnormalities (see scoring  diagram/findings for description). There is  moderate left ventricular hypertrophy. Left ventricular diastolic  parameters are consistent with Grade I diastolic dysfunction (impaired  relaxation). There is mild dyskinesis of the left ventricular, apex. There  is severe hypokinesis of the left  ventricular, mid-apical anterior wall, anteroseptal wall, inferior wall  and inferoseptal wall. No intracavitary thrombus is seen, but images are  suboptimal.  2. Right ventricular systolic function is normal. The right ventricular  size is normal. There is mildly elevated pulmonary artery systolic  pressure. The estimated right ventricular systolic  pressure is 36.2 mmHg.  3. Left atrial size was mildly dilated.  4. The mitral valve is normal in structure. No evidence of mitral valve  regurgitation.  5. Aortic valve gradients are underestimated due to poor left ventricular  systolic function. The aortic valve is tricuspid. Aortic valve  regurgitation is not visualized. Mild aortic valve stenosis.  6. The inferior vena cava is dilated in size with <50% respiratory  variability, suggesting right atrial pressure of 15 mmHg.   Medications Ordered in ED Medications  nitroGLYCERIN (NITROGLYN) 2 % ointment 0.5 inch (0.5 inches Topical Given 07/08/19 0333)  furosemide (LASIX) injection 60 mg (60 mg Intravenous Given 07/08/19 0331)    ED Course  I have reviewed the triage vital signs and the nursing notes.  Pertinent labs & imaging results that were available during my care of the patient were reviewed by me and considered in my medical decision making (see chart for details).    MDM Rules/Calculators/A&P                       After reviewing patient's labs she had nitroglycerin placed her chest and she was given IV Lasix.  Recheck at 3:45 AM patient had just use the bedside commode and she was  very tachypneic when she got back in bed.  She had less than 100 cc urinary output.  We discussed her test results which were consistent with congestive heart failure.  I discussed I would talk to the cardiologist to see if she could stay here she needs to go back [facility] to [facility].  4:16 AM I spoke to Dr. Carolanne Grumbling, cardiologist on-call.  We discussed her treatment tonight, she suggest stopping the nitroglycerin paste and just give her Lasix for diuresis.  She feels like patient could be admitted here and seen by the cardiologist here in the morning.  04:35 AM Dr Robb Matar, hospitalist, will admit   Final Clinical Impression(s) / ED Diagnoses Final diagnoses:  Acute congestive heart failure, unspecified heart failure type [facility])    Rx /  DC Orders  Plan admission  Devoria Albe, MD, Concha Pyo, MD 07/08/19 [phone]

## 2019-07-08 NOTE — Progress Notes (Signed)
PROGRESS NOTE  Terry Hanson BDZ:[id number] DOB: 1940/04/30 DOA: 07/08/2019 PCP: Terry Linsey, MD  Brief History:  79 year old female with a history of diabetes mellitus type 2, hyperlipidemia, systolic and diastolic CHF presents with 3 to 4-day history of shortness of breath, worsening lower extremity edema, and orthopnea.  The patient was recently admitted to the hospital from 06/29/2019 to 07/02/2019 with an STEMI.  Cardiac catheterization on 06/29/2019 showed a 99% stenosis of the proximal LAD.  Stent was placed with residual 0% stenosis.  The patient was discharged home with aspirin 81 mg, Brilinta, and carvedilol 12.5 mg twice daily as well as Crestor.  Since discharge, the patient has complained of progressive shortness of breath.  She denies any chest discomfort, fevers, chills, nausea, vomiting, diarrhea, abdominal pain. In the emergency department, the patient was afebrile hemodynamically stable with oxygen saturation 98% on room air.  Sodium was 129 with serum creatinine 1.41.  WBC was 14.1 with hemoglobin 11.1.  EKG shows sinus rhythm with no STT changes.  BNP was 1020.  Troponin was 2827.  The patient was given furosemide IV.  Cardiology was consulted to assist with management.  Assessment/Plan: Acute on chronic systolic and diastolic CHF -06/30/2019 echo EF 30-35%, grade 1 DD, significant WMA -Continue IV furosemide -Cardiology consult -Daily weights -Accurate I's and Os  Ischemic cardiomyopathy -06/29/2019 cath shows 99% proximal LAD stenosis -Cardiology consult as discussed above -Continue aspirin, Brilinta, carvedilol, statin -Continue low-dose lisinopril  Diabetes mellitus type 2 -NovoLog sliding scale -Holding Metformin -07/01/2019 hemoglobin A1c 6.5   Hyperlipidemia -Continue statin  Leukocytosis -Likely stress demargination -Patient is afebrile hemodynamically stable -Monitor off antibiotics -A.m. CBC -UA and urine culture     Disposition  Plan: Patient From: Home D/C Place: Home - 2-3  Days Barriers: Not Clinically Stable--remains fluid overloaded requiring IV lasix  Family Communication:   Daughter updated at bedside 4/21  Consultants:  cardiology  Code Status:  FULL  DVT Prophylaxis:  [place] Heparin   Procedures: As Listed in Progress Note Above  Antibiotics: None   Total time spent 35 minutes.  Greater than 50% spent face to face counseling and coordinating care.    Subjective: Patient remains short of breath but states that it is better than yesterday.  She denies any chest pain.  There is no fever, chills, nausea, vomiting, direct abdominal pain.  Objective: Vitals:   07/08/19 0600 07/08/19 0615 07/08/19 0630 07/08/19 0700  BP: 130/62  (!) 117/58 (!) 120/59  Pulse: 73 71 72 69  Resp: (!) 21 18 (!) 21 (!) 23  Temp:      SpO2: 98% 99% 98% 100%  Weight:      Height:       No intake or output data in the 24 hours ending 07/08/19 0729 Weight change:  Exam:   General:  Pt is alert, follows commands appropriately, not in acute distress  HEENT: No icterus, No thrush, No neck mass, [place]/AT  Cardiovascular: RRR, S1/S2, no rubs, no gallops  Respiratory: Fine bibasilar crackles but no wheezing.  Good air movement  Abdomen: Soft/+BS, non tender, non distended, no guarding  Extremities: trace LE edema, No lymphangitis, No petechiae, No rashes, no synovitis   Data Reviewed: I have personally reviewed following labs and imaging studies Basic Metabolic Panel: Recent Labs  Lab 07/08/19 0204  NA 129*  K 3.9  CL 96*  CO2 23  GLUCOSE 167*  BUN 27*  CREATININE 1.41*  CALCIUM 8.7*   Liver Function Tests: Recent Labs  Lab 07/08/19 0204  AST 41  ALT 57*  ALKPHOS 43  BILITOT 1.6*  PROT 7.5  ALBUMIN 3.4*   No results for input(s): LIPASE, AMYLASE in the last 168 hours. No results for input(s): AMMONIA in the last 168 hours. Coagulation Profile: No results for input(s): INR, PROTIME in the last  168 hours. CBC: Recent Labs  Lab 07/08/19 0204  WBC 14.8*  NEUTROABS 11.4*  HGB 11.1*  HCT 34.0*  MCV 95.2  PLT 342   Cardiac Enzymes: No results for input(s): CKTOTAL, CKMB, CKMBINDEX, TROPONINI in the last 168 hours. BNP: Invalid input(s): POCBNP CBG: Recent Labs  Lab 07/01/19 1606 07/01/19 2120 07/02/19 0829 07/02/19 1254 07/02/19 1551  GLUCAP 114* 138* 201* 135* 171*   HbA1C: No results for input(s): HGBA1C in the last 72 hours. Urine analysis:    Component Value Date/Time   COLORURINE YELLOW 05/27/2018 1532   APPEARANCEUR CLEAR 05/27/2018 1532   LABSPEC 1.021 05/27/2018 1532   PHURINE 5.0 05/27/2018 1532   GLUCOSEU NEGATIVE 05/27/2018 1532   HGBUR NEGATIVE 05/27/2018 1532   BILIRUBINUR NEGATIVE 05/27/2018 1532   KETONESUR NEGATIVE 05/27/2018 1532   PROTEINUR NEGATIVE 05/27/2018 1532   NITRITE NEGATIVE 05/27/2018 1532   LEUKOCYTESUR NEGATIVE 05/27/2018 1532   Sepsis Labs: @LABRCNTIP (procalcitonin:4,lacticidven:4) ) Recent Results (from the past 240 hour(s))  Respiratory Panel by RT PCR (Flu A&B, Covid) - Nasopharyngeal Swab     Status: None   Collection Time: 06/29/19  7:57 PM   Specimen: Nasopharyngeal Swab  Result Value Ref Range Status   SARS Coronavirus 2 by RT PCR NEGATIVE NEGATIVE Final    Comment: (NOTE) SARS-CoV-2 target nucleic acids are NOT DETECTED. The SARS-CoV-2 RNA is generally detectable in upper respiratoy specimens during the acute phase of infection. The lowest concentration of SARS-CoV-2 viral copies this assay can detect is 131 copies/mL. A negative result does not preclude SARS-Cov-2 infection and should not be used as the sole basis for treatment or other patient management decisions. A negative result may occur with  improper specimen collection/handling, submission of specimen other than nasopharyngeal swab, presence of viral mutation(s) within the areas targeted by this assay, and inadequate number of viral copies (<131  copies/mL). A negative result must be combined with clinical observations, patient history, and epidemiological information. The expected result is Negative. Fact Sheet for Patients:  08/29/19 Fact Sheet for Healthcare Providers:  https://www.moore.com/ This test is not yet ap proved or cleared by the https://www.young.biz/ FDA and  has been authorized for detection and/or diagnosis of SARS-CoV-2 by FDA under an Emergency Use Authorization (EUA). This EUA will remain  in effect (meaning this test can be used) for the duration of the COVID-19 declaration under Section 564(b)(1) of the Act, 21 U.S.C. section 360bbb-3(b)(1), unless the authorization is terminated or revoked sooner.    Influenza A by PCR NEGATIVE NEGATIVE Final   Influenza B by PCR NEGATIVE NEGATIVE Final    Comment: (NOTE) The Xpert Xpress SARS-CoV-2/FLU/RSV assay is intended as an aid in  the diagnosis of influenza from Nasopharyngeal swab specimens and  should not be used as a sole basis for treatment. Nasal washings and  aspirates are unacceptable for Xpert Xpress SARS-CoV-2/FLU/RSV  testing. Fact Sheet for Patients: [place] Fact Sheet for Healthcare Providers: https://www.moore.com/ This test is not yet approved or cleared by the https://www.young.biz/ FDA and  has been authorized for detection and/or diagnosis of SARS-CoV-2 by  FDA under an Emergency  Use Authorization (EUA). This EUA will remain  in effect (meaning this test can be used) for the duration of the  Covid-19 declaration under Section 564(b)(1) of the Act, 21  U.S.C. section 360bbb-3(b)(1), unless the authorization is  terminated or revoked. Performed at [facility], [place] [address] [place], [place] [address]   MRSA PCR Screening     Status: Abnormal   Collection Time: 06/29/19  9:30 PM   Specimen: Nasopharyngeal  Result Value Ref Range Status   MRSA by  PCR POSITIVE (A) NEGATIVE Final    Comment:        The GeneXpert MRSA Assay (FDA approved for NASAL specimens only), is one component of a comprehensive MRSA colonization surveillance program. It is not intended to diagnose MRSA infection nor to guide or monitor treatment for MRSA infections. RESULT CALLED TO, READ BACK BY AND VERIFIED WITH: A NINO RN 06/30/19 0058 JDW Performed at [facility], [place] [address], [place], [place] [address]      Scheduled Meds: . aspirin  81 mg Oral Daily  . furosemide  40 mg Intravenous BID  . insulin aspart  0-15 Units Subcutaneous TID WC  . lisinopril  2.5 mg Oral Daily  . rosuvastatin  40 mg Oral Daily  . ticagrelor  90 mg Oral BID   Continuous Infusions:  Procedures/Studies: CARDIAC CATHETERIZATION  Result Date: 06/29/2019  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  99% stenosis/thrombotic with TIMI I flow reduced to 0% with TIMI grade III flow following stent implantation using a 32 x 2.5 mm diameter onyx postdilated to 3.0 mm throughout the proximal two thirds of the stent.  The LAD is otherwise widely patent.  Less than 20% proximal left main  Small first obtuse marginal with proximal 70% narrowing.  Mid to distal circumflex with 60% narrowing.  Right coronary arises somewhat anterior.  There is 40 to 50% proximal/ostial narrowing.  There is 70% hazy eccentric mid RCA stenosis.  LVEDP 40, consistent with acute systolic heart failure.  Prior status of the left ventricle was not known. RECOMMENDATIONS:  Management of acute presumed systolic heart failure: BiPAP; IV Lasix; Foley catheter; IV nitroglycerin 10-100 mics to keep systolic blood pressure 932 to 120 mmHg.  High intensity statin therapy  Once volume overload has resolved, beta-blocker and angiotensin receptor/ACE/Arni therapy should be considered  2D Doppler echocardiogram to assess LV function in a.m.  Medical therapy of right coronary  and circumflex disease.  Aggressive risk factor modification.  DG Chest Port 1 View  Result Date: 07/08/2019 CLINICAL DATA:  Shortness of breath. Recent myocardial infarction. EXAM: PORTABLE CHEST 1 VIEW COMPARISON:  Radiograph 06/29/2019 FINDINGS: Overlying artifact from life vest/monitoring device. Low lung volumes. Mild cardiomegaly. Prior perihilar opacities have near completely resolved likely pulmonary edema. No pleural fluid, pneumothorax, or confluent airspace disease. IMPRESSION: Mild cardiomegaly. Resolved pulmonary edema from exam earlier this month. Electronically Signed   By: Keith Rake M.D.   On: 07/08/2019 02:42   DG Chest Port 1 View  Result Date: 06/29/2019 CLINICAL DATA:  Chest pain EXAM: PORTABLE CHEST 1 VIEW COMPARISON:  05/27/2018 FINDINGS: Hazy opacities throughout the left lung. Slight parahilar opacity in the right lung. Cardiomediastinal contours are normal. No pleural effusion or pneumothorax. IMPRESSION: Left-greater-than-right pulmonary opacities favored to indicate pulmonary edema. Electronically Signed   By: Ulyses Jarred M.D.   On: 06/29/2019 22:15   ECHOCARDIOGRAM COMPLETE  Result Date: 06/30/2019    ECHOCARDIOGRAM REPORT  Patient Name:   Terry Hanson  Date of Exam: 06/30/2019 Medical Rec #:  [id number]  Height:       65.0 in Accession #:    [id number] Weight:       218.3 lb Date of Birth:  06/15/1940   BSA:          2.053 m Patient Age:    78 years   BP:           117/64 mmHg Patient Gender: F          HR:           92 bpm. Exam Location:  Inpatient Procedure: 2D Echo, Cardiac Doppler and Color Doppler Indications:    I50.23 Acute on chronic systolic (congestive) heart failure  History:        Patient has no prior history of Echocardiogram examinations.                 Cardiomegaly; Risk Factors:Hypertension and Diabetes.  Sonographer:    Elmarie Shileyiffany Dance Referring Phys: [phone] Barry DienesHENRY W New[facility] HospitalMITH  Sonographer Comments: Suboptimal subcostal window. IMPRESSIONS  1. Left ventricular  ejection fraction, by estimation, is 30 to 35%. The left ventricle has moderately decreased function. The left ventricle demonstrates regional wall motion abnormalities (see scoring diagram/findings for description). There is moderate left ventricular hypertrophy. Left ventricular diastolic parameters are consistent with Grade I diastolic dysfunction (impaired relaxation). There is mild dyskinesis of the left ventricular, apex. There is severe hypokinesis of the left ventricular, mid-apical anterior wall, anteroseptal wall, inferior wall and inferoseptal wall. No intracavitary thrombus is seen, but images are suboptimal.  2. Right ventricular systolic function is normal. The right ventricular size is normal. There is mildly elevated pulmonary artery systolic pressure. The estimated right ventricular systolic pressure is 36.2 mmHg.  3. Left atrial size was mildly dilated.  4. The mitral valve is normal in structure. No evidence of mitral valve regurgitation.  5. Aortic valve gradients are underestimated due to poor left ventricular systolic function. The aortic valve is tricuspid. Aortic valve regurgitation is not visualized. Mild aortic valve stenosis.  6. The inferior vena cava is dilated in size with <50% respiratory variability, suggesting right atrial pressure of 15 mmHg. FINDINGS  Left Ventricle: Left ventricular ejection fraction, by estimation, is 30 to 35%. The left ventricle has moderately decreased function. The left ventricle demonstrates regional wall motion abnormalities. Mild dyskinesis of the left ventricular, entire apical segment. Severe hypokinesis of the left ventricular, mid-apical anterior wall, anteroseptal wall, inferior wall and inferoseptal wall. The left ventricular internal cavity size was normal in size. There is moderate left ventricular hypertrophy. Left ventricular diastolic parameters are consistent with Grade I diastolic dysfunction (impaired relaxation). Normal left ventricular  filling pressure. Right Ventricle: The right ventricular size is normal. No increase in right ventricular wall thickness. Right ventricular systolic function is normal. There is mildly elevated pulmonary artery systolic pressure. The tricuspid regurgitant velocity is 2.30  m/s, and with an assumed right atrial pressure of 15 mmHg, the estimated right ventricular systolic pressure is 36.2 mmHg. Left Atrium: Left atrial size was mildly dilated. Right Atrium: Right atrial size was normal in size. Pericardium: There is no evidence of pericardial effusion. Mitral Valve: The mitral valve is normal in structure. Mild mitral annular calcification. No evidence of mitral valve regurgitation. Tricuspid Valve: The tricuspid valve is normal in structure. Tricuspid valve regurgitation is trivial. Aortic Valve: Aortic valve gradients are underestimated due to poor left ventricular systolic function. The aortic  valve is tricuspid. Aortic valve regurgitation is not visualized. Mild aortic stenosis is present. Aortic valve mean gradient measures 7.7 mmHg. Aortic valve peak gradient measures 15.3 mmHg. Dimensionless index is 0.43. Pulmonic Valve: The pulmonic valve was not well visualized. Pulmonic valve regurgitation is not visualized. Aorta: The aortic root and ascending aorta are structurally normal, with no evidence of dilitation. Venous: The inferior vena cava is dilated in size with less than 50% respiratory variability, suggesting right atrial pressure of 15 mmHg. IAS/Shunts: No atrial level shunt detected by color flow Doppler.  LEFT VENTRICLE PLAX 2D LVIDd:         2.70 cm  Diastology LVIDs:         2.10 cm  LV e' lateral:   5.00 cm/s LV PW:         1.70 cm  LV E/e' lateral: 10.4 LV IVS:        1.40 cm  LV e' medial:    3.81 cm/s LVOT diam:     1.90 cm  LV E/e' medial:  13.6 LV SV:         51 LV SV Index:   25 LVOT Area:     2.84 cm  RIGHT VENTRICLE             IVC RV Basal diam:  2.10 cm     IVC diam: 2.00 cm RV S prime:      12.50 cm/s TAPSE (M-mode): 2.0 cm LEFT ATRIUM           Index       RIGHT ATRIUM          Index LA diam:      3.70 cm 1.80 cm/m  RA Area:     9.22 cm LA Vol (A2C): 54.6 ml 26.60 ml/m RA Volume:   18.10 ml 8.82 ml/m LA Vol (A4C): 44.5 ml 21.68 ml/m  AORTIC VALVE AV Area (Vmax): 1.23 cm AV Vmax:        195.46 cm/s AV Peak Grad:   15.3 mmHg AV Mean Grad:   7.7 mmHg LVOT Vmax:      85.00 cm/s LVOT Vmean:     56.300 cm/s LVOT VTI:       0.180 m  AORTA Ao Root diam: 3.00 cm Ao Asc diam:  3.40 cm MITRAL VALVE                TRICUSPID VALVE MV Area (PHT): 3.99 cm     TR Peak grad:   21.2 mmHg MV Decel Time: 190 msec     TR Vmax:        230.00 cm/s MV E velocity: 52.00 cm/s MV A velocity: 106.00 cm/s  SHUNTS MV E/A ratio:  0.49         Systemic VTI:  0.18 m                             Systemic Diam: 1.90 cm Rachelle Hora Croitoru MD Electronically signed by Thurmon Fair MD Signature Date/Time: 06/30/2019/9:21:52 AM    Final     Catarina Hartshorn, DO  [facility]  If 7PM-7AM, please contact night-coverage [email or web address] Password [email or web address] 07/08/2019, 7:29 AM   LOS: 0 days

## 2019-07-08 NOTE — Consult Note (Addendum)
Cardiology Consult    Patient ID: MADALENA KESECKER; [id number]; Dec 08, 1940   Admit date: 07/08/2019 Date of Consult: 07/08/2019  Primary Care Provider: [name], MD Primary Cardiologist: Lesleigh Noe, MD   Patient Profile    CHRISTE TELLEZ is a 79 y.o. female with past medical history of CAD (s/p recent STEMI on 06/29/2019 with DES to proximal-LAD), ischemic cardiomyopathy (EF 30-35% by echo earlier this month), HTN, HLD and Type 2 DM who is being seen today for evaluation of CHF at the request of Dr. Robb Matar.   History of Present Illness    Ms. Cicio was most recently admitted to [facility] from 4/12 - 07/02/2019 for a STEMI as initial EKG by EMS showed ST elevation along the anterior leads. Cardiac catheterization by Dr. Katrinka Blazing showed 99% stenosis along the proximal LAD which was treated with PCI/DES placement. She did have residual disease with 20% LM, 70% OM1, 60% mid to distal LCx, 40 to 50% proximal RCA and 70% mid RCA stenosis.  Echocardiogram was performed on admission and showed a reduced EF of 30 to 35% with mild dyskinesis of the left ventricular, apex and severe hypokinesis of the left ventricular, mid-apical anterior wall, anteroseptal wall, inferior wall  and inferoseptal wall. HS Troponin peaked > 27000. She did have intermittent episodes of dyspnea during admission which was thought to be secondary to [facility] and she was continued on ASA and Brilinta at that time with consideration of switching to Plavix if symptoms did not improve. She was started on Coreg 12.5 mg twice daily but was not started on ACE-I/ARB/ARNI during admission given her elevated creatinine (peaked at 1.43, improved to 1.37 at the time of discharge). Was discharged with a Technical sales engineer.   She presented to [facility] ED this morning for evaluation of worsening dyspnea and headache since hospital discharge. In talking with the patient and her daughter today, she reports worsening dyspnea at rest and with exertion  ever since hospital discharge. She has even been getting short of breath with food consumption. She denies any recurrent chest pain or palpitations. She has experienced orthopnea along with abdominal distention and lower extremity edema. She has been following a low-sodium diet.   Labs showed WBC 14.8, Hgb 11.1, platelets 342, Na+ 129, K+ 3.9 and creatinine 1.41.  BNP elevated to 1020 (previously 37.2 during last admission). Initial HS Troponin 2827 with repeat of 2362. CXR with mild cardiomegaly. EKG showed NSR, HR 83 with anterior infarct pattern.  Received IV Lasix 60 mg upon arrival and is scheduled to start 40 mg twice daily. She reports having already urinated multiple times and has experienced improvement in her respiratory status.  Past Medical History:  Diagnosis Date  . Arthritis   . Asthma   . Diabetes mellitus (HCC)   . Diabetes mellitus without complication (HCC)   . Enlarged heart   . Hypercholesterolemia   . Hyperlipidemia   . Ischemic cardiomyopathy    a. EF 30-35% by echo in 06/2019  . STEMI (ST elevation myocardial infarction) (HCC)    a. 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    Past Surgical History:  Procedure Laterality Date  . ABDOMINAL HYSTERECTOMY    . CORONARY/GRAFT ACUTE MI REVASCULARIZATION N/A 06/29/2019   Procedure: Coronary/Graft Acute MI Revascularization;  Surgeon: Lyn Records, MD;  Location: [facility] INVASIVE CV LAB;  Service: Cardiovascular;  Laterality: N/A;  . LEFT HEART CATH AND CORONARY ANGIOGRAPHY N/A 06/29/2019   Procedure: LEFT  HEART CATH AND CORONARY ANGIOGRAPHY;  Surgeon: Lyn Records, MD;  Location: [facility] INVASIVE CV LAB;  Service: Cardiovascular;  Laterality: N/A;     Home Medications:  Prior to Admission medications   Medication Sig Start Date End Date Taking? Authorizing Provider  aspirin 81 MG chewable tablet Chew 1 tablet (81 mg total) by mouth daily. 07/02/19   Arty Baumgartner, NP  carvedilol (COREG) 12.5 MG tablet Take 1  tablet (12.5 mg total) by mouth 2 (two) times daily with a meal. 07/02/19   Laverda Page B, NP  metFORMIN (GLUCOPHAGE) 500 MG tablet Take 500 mg by mouth daily.    [provider]  metFORMIN (GLUCOPHAGE) 500 MG tablet Take 500 mg by mouth 2 (two) times daily. 04/14/19   [provider]  nitroGLYCERIN (NITROSTAT) 0.4 MG SL tablet Place 1 tablet (0.4 mg total) under the tongue every 5 (five) minutes as needed. 07/02/19   Arty Baumgartner, NP  rosuvastatin (CRESTOR) 40 MG tablet Take 1 tablet (40 mg total) by mouth daily. 07/02/19   Arty Baumgartner, NP  ticagrelor (BRILINTA) 90 MG TABS tablet Take 1 tablet (90 mg total) by mouth 2 (two) times daily. 07/02/19   Arty Baumgartner, NP    Inpatient Medications: Scheduled Meds: . aspirin  81 mg Oral Daily  . furosemide  40 mg Intravenous BID  . insulin aspart  0-15 Units Subcutaneous TID WC  . lisinopril  2.5 mg Oral Daily  . rosuvastatin  40 mg Oral Daily  . ticagrelor  90 mg Oral BID   Continuous Infusions:  PRN Meds: acetaminophen **OR** acetaminophen, nitroGLYCERIN, ondansetron **OR** ondansetron (ZOFRAN) IV  Allergies:    Allergies  Allergen Reactions  . Codeine Hives  . Penicillins Hives  . Sulfa Antibiotics Itching  . Codeine Itching and Rash  . Penicillins Itching and Rash    Did it involve swelling of the face/tongue/throat, SOB, or low BP? Yes Did it involve sudden or severe rash/hives, skin peeling, or any reaction on the inside of your mouth or nose? Yes Did you need to seek medical attention at a hospital or doctor's office? Yes When did it last happen? Over 10 years If all above answers are "NO", may proceed with cephalosporin use.   . Sulfa Antibiotics Itching and Rash    Social History:   Social History   Socioeconomic History  . Marital status: Married    Spouse name: Not on file  . Number of children: Not on file  . Years of education: Not on file  . Highest education level: Not on  file  Occupational History  . Not on file  Tobacco Use  . Smoking status: Never Smoker  . Smokeless tobacco: Never Used  Substance and Sexual Activity  . Alcohol use: Not Currently  . Drug use: Never  . Sexual activity: Not on file  Other Topics Concern  . Not on file  Social History Narrative   ** Merged History Encounter **       Social Determinants of Health   Financial Resource Strain:   . Difficulty of Paying Living Expenses:   Food Insecurity:   . Worried About Programme researcher, broadcasting/film/video in the Last Year:   . Barista in the Last Year:   Transportation Needs:   . Freight forwarder (Medical):   Marland Kitchen Lack of Transportation (Non-Medical):   Physical Activity:   . Days of Exercise per Week:   . Minutes of Exercise per  Session:   Stress:   . Feeling of Stress :   Social Connections:   . Frequency of Communication with Friends and Family:   . Frequency of Social Gatherings with Friends and Family:   . Attends Religious Services:   . Active Member of Clubs or Organizations:   . Attends Archivist Meetings:   Marland Kitchen Marital Status:   Intimate Partner Violence:   . Fear of Current or Ex-Partner:   . Emotionally Abused:   Marland Kitchen Physically Abused:   . Sexually Abused:      Family History:    Family History  Problem Relation Age of Onset  . Heart disease Mother       Review of Systems    General:  No chills, fever, night sweats or weight changes.  Cardiovascular:  No chest pain, palpitations, paroxysmal nocturnal dyspnea. Positive for dyspnea on exertion, orthopnea, PND and edema.  Dermatological: No rash, lesions/masses Respiratory: No cough, dyspnea Urologic: No hematuria, dysuria Abdominal:   No nausea, vomiting, diarrhea, bright red blood per rectum, melena, or hematemesis Neurologic:  No visual changes, wkns, changes in mental status. All other systems reviewed and are otherwise negative except as noted above.  Barbarann Ehlers, RN was present  throughout the entirety of the encounter.  Physical Exam/Data    Vitals:   07/08/19 0700 07/08/19 0730 07/08/19 0800 07/08/19 0830  BP: (!) 120/59 (!) 125/59 (!) 113/58 (!) 122/51  Pulse: 69 74 69 74  Resp: (!) 23 (!) 22 18 (!) 22  Temp:      SpO2: 100% 100% 100% 100%  Weight:      Height:        Intake/Output Summary (Last 24 hours) at 07/08/2019 0852 Last data filed at 07/08/2019 0808 Gross per 24 hour  Intake -  Output 900 ml  Net -900 ml   Filed Weights   07/08/19 0143  Weight: 97.7 kg   Body mass index is 35.84 kg/m.   General: Pleasant, female appearing in NAD Psych: Normal affect. Neuro: Alert and oriented X 3. Moves all extremities spontaneously. HEENT: Normal  Neck: Supple without bruits. JVD at 9cm. Lungs:  Resp regular and unlabored, rales along bases bilaterally. Heart: RRR no s3, s4, or murmurs. Life Vest in place.  Abdomen: Soft, non-tender, non-distended, BS + x 4.  Extremities: No clubbing or cyanosis. Trace lower extremity edema. DP/PT/Radials 2+ and equal bilaterally.   EKG:  The EKG was personally reviewed and demonstrates: NSR, HR 83 with anterior infarct pattern.   Telemetry:  Telemetry was personally reviewed and demonstrates: NSR, HR in 60's to 70's with occasional PVC's. Sometimes in a couplet pattern.    Labs/Studies     Relevant CV Studies:  Cardiac Catheterization: 06/29/2019  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  99% stenosis/thrombotic with TIMI I flow reduced to 0% with TIMI grade III flow following stent implantation using a 32 x 2.5 mm diameter onyx postdilated to 3.0 mm throughout the proximal two thirds of the stent.  The LAD is otherwise widely patent.  Less than 20% proximal left main  Small first obtuse marginal with proximal 70% narrowing.  Mid to distal circumflex with 60% narrowing.  Right coronary arises somewhat anterior.  There is 40 to 50% proximal/ostial narrowing.   There is 70% hazy eccentric mid RCA stenosis.  LVEDP 40, consistent with acute systolic heart failure.  Prior status of the left ventricle was not known.  RECOMMENDATIONS:   Management  of acute presumed systolic heart failure: BiPAP; IV Lasix; Foley catheter; IV nitroglycerin 10-100 mics to keep systolic blood pressure 110 to 120 mmHg.  High intensity statin therapy  Once volume overload has resolved, beta-blocker and angiotensin receptor/ACE/Arni therapy should be considered  2D Doppler echocardiogram to assess LV function in a.m.  Medical therapy of right coronary and circumflex disease.  Aggressive risk factor modification.  Echocardiogram: 06/29/2019 IMPRESSIONS    1. Left ventricular ejection fraction, by estimation, is 30 to 35%. The  left ventricle has moderately decreased function. The left ventricle  demonstrates regional wall motion abnormalities (see scoring  diagram/findings for description). There is  moderate left ventricular hypertrophy. Left ventricular diastolic  parameters are consistent with Grade I diastolic dysfunction (impaired  relaxation). There is mild dyskinesis of the left ventricular, apex. There  is severe hypokinesis of the left  ventricular, mid-apical anterior wall, anteroseptal wall, inferior wall  and inferoseptal wall. No intracavitary thrombus is seen, but images are  suboptimal.  2. Right ventricular systolic function is normal. The right ventricular  size is normal. There is mildly elevated pulmonary artery systolic  pressure. The estimated right ventricular systolic pressure is 36.2 mmHg.  3. Left atrial size was mildly dilated.  4. The mitral valve is normal in structure. No evidence of mitral valve  regurgitation.  5. Aortic valve gradients are underestimated due to poor left ventricular  systolic function. The aortic valve is tricuspid. Aortic valve  regurgitation is not visualized. Mild aortic valve stenosis.  6. The  inferior vena cava is dilated in size with <50% respiratory  variability, suggesting right atrial pressure of 15 mmHg.   Laboratory Data:  Chemistry Recent Labs  Lab 07/08/19 0204  NA 129*  K 3.9  CL 96*  CO2 23  GLUCOSE 167*  BUN 27*  CREATININE 1.41*  CALCIUM 8.7*  GFRNONAA 36*  GFRAA 42*  ANIONGAP 10    Recent Labs  Lab 07/08/19 0204  PROT 7.5  ALBUMIN 3.4*  AST 41  ALT 57*  ALKPHOS 43  BILITOT 1.6*   Hematology Recent Labs  Lab 07/08/19 0204  WBC 14.8*  RBC 3.57*  HGB 11.1*  HCT 34.0*  MCV 95.2  MCH 31.1  MCHC 32.6  RDW 13.7  PLT 342   Cardiac EnzymesNo results for input(s): TROPONINI in the last 168 hours. No results for input(s): TROPIPOC in the last 168 hours.  BNP Recent Labs  Lab 07/08/19 0204  BNP 1,020.0*    DDimer No results for input(s): DDIMER in the last 168 hours.  Radiology/Studies:  DG Chest Port 1 View  Result Date: 07/08/2019 CLINICAL DATA:  Shortness of breath. Recent myocardial infarction. EXAM: PORTABLE CHEST 1 VIEW COMPARISON:  Radiograph 06/29/2019 FINDINGS: Overlying artifact from life vest/monitoring device. Low lung volumes. Mild cardiomegaly. Prior perihilar opacities have near completely resolved likely pulmonary edema. No pleural fluid, pneumothorax, or confluent airspace disease. IMPRESSION: Mild cardiomegaly. Resolved pulmonary edema from exam earlier this month. Electronically Signed   By: Narda Rutherford M.D.   On: 07/08/2019 02:42     Assessment & Plan    1. 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 - BNP elevated to 1020 (previously 37.2 during last admission). CXR with mild cardiomegaly. She already received IV Lasix 60 mg upon arrival and is scheduled to start 40 mg twice daily.  Continue to follow daily weights and I&O's.  - she has  been started on Lisinopril. Coreg currently held given her acute decompensation but will plan to restart prior to discharge. She will ultimately require baseline Lasix dosing at the time of discharge as she did not have this prior to admission. She does have a LifeVest in place given her ICM.  2. CAD/Elevated Troponin Values - s/p recent STEMI on 06/29/2019 with DES to proximal-LAD.  She has experienced worsening dyspnea as outlined above but denies any recent chest pain.  - Initial HS Troponin 2827 with repeat of 2362 which is trending down from last week. EKG shows NSR, HR 83 with anterior infarct pattern. - The patient and her daughter feel like Brilinta is still significantly contributing to her dyspnea. We reviewed that her dyspnea is likely multifactorial due to this along with fluid accumulation. If dyspnea does not improve with diuresis, may need to switch to Plavix with loading dose of 300 mg daily followed by 75 mg daily. For now, will continue with ASA, Brilinta and statin therapy.  3. HTN - BP has been well-controlled while in the ED, at 122/51 on most recent check. Coreg held in the setting of her acutely decompensated CHF. She has been started on Lisinopril 2.5 mg daily.    4. HLD - FLP last month showed total cholesterol 266, triglycerides 117, HDL 51 and LDL 192.  She was started on Crestor 40 mg daily. Will need repeat FLP and LFT's in 6-8 weeks.   5. Type 2 DM - Hgb A1c at 6.5 when checked earlier this month.    For questions or updates, please contact [facility] Please consult [email or web address] for contact info under Cardiology/STEMI.  Signed, Ellsworth LennoxBrittany M Strader, PA-C 07/08/2019, 8:52 AM Pager: [phone]  The patient was seen and examined, and I agree with the history, physical exam, assessment and plan as documented above, with modifications made above and as noted below. I have also personally reviewed all relevant documentation, old records, labs, and both  radiographic and cardiovascular studies. I have also independently interpreted old and new ECG's.  Briefly, this is a 79 year old woman with a history of recent STEMI with drug-eluting stent placement to the proximal LAD on 06/29/2019.  She has an ischemic cardiomyopathy with an LVEF of 30 to 35%.  Her daughter was present at the time of my evaluation.  Her daughter says that the patient has been short of breath ever since being discharged.  The patient says she has progressively gotten more more short of breath even with speaking over the past week.  When she was hospitalized Dr. Allyson SabalBerry mention that some of her symptoms may be due to Brilinta.  She was started on carvedilol but not on an ACE inhibitor or angiotensin receptor blocker due to elevated creatinine.  She was given a LifeVest due to reduced LV function.  She was not prescribed a diuretic.  She presented [facility] ED this morning for worsening shortness of breath.  She denies chest pain altogether.  She was given 1 dose of IV Lasix 60 mg and symptoms have already improved and she is able to speak without dyspnea.  Carvedilol has been held.  Chest x-ray showed cardiomegaly without pulmonary edema.  BNP elevated to 1020.  High-sensitivity troponins were also elevated to greater than 2000.  Impression reviewed ECG which shows sinus rhythm with anterior infarct.  Recommendations: I spoke to the patient and daughter at length about the importance of  Brilinta in the setting of recent anterior wall STEMI and proximal LAD drug-eluting stent placement.  If symptoms persist in spite of euvolemia, Brilinta could be switched to clopidogrel with a 300 mg loading dose followed by 75 mg daily.  As she has been on carvedilol for at least a week I do not recommend holding it given recent STEMI and ischemic cardiomyopathy.  Thus, I will resume at lower dose of 3.125 mg twice daily.  She has been ordered IV Lasix 40 mg twice daily.   She will need an oral diuretic at discharge.  Continue lisinopril and rosuvastatin.  Prentice Docker, MD, [facility]  07/08/2019 10:01 AM

## 2019-07-08 NOTE — Telephone Encounter (Signed)
Thanks

## 2019-07-08 NOTE — Telephone Encounter (Signed)
Patient's daughter called stating that her mother had an MI recently and was placed on Brilinta.  Since going home she has had SOB and seems to be getting worse.  She cannot lay down or walk without significant SOB.  She has tried drinks with caffeine because she thought it was related to the Brilinta.  She has been wheezing and having LE edema.  Instructed her to take her mom to [facility].

## 2019-07-08 NOTE — ED Notes (Signed)
Patient moved up into the bed and pur-wick checked.

## 2019-07-08 NOTE — Care Management Obs Status (Signed)
MEDICARE OBSERVATION STATUS NOTIFICATION   Patient Details  Name: Terry Hanson MRN: [id number] Date of Birth: Jul 31, 1940   Medicare Observation Status Notification Given:  Yes    Ewing Schlein, LCSW 07/08/2019, 6:21 PM

## 2019-07-08 NOTE — Progress Notes (Signed)
Consult request has been received. CSW attempting to follow up at present time  Danis Pembleton M. Roddie Riegler LCSWA Transitions of Care  Clinical Social Worker  Ph: [phone] 

## 2019-07-08 NOTE — ED Triage Notes (Signed)
Pt c/o SOB, headache, insomnia, and inability to eat since starting Brilinta after having a recent MI and stent placement.

## 2019-07-09 DIAGNOSIS — Z955 Presence of coronary angioplasty implant and graft: Secondary | ICD-10-CM | POA: Diagnosis not present

## 2019-07-09 DIAGNOSIS — Z6837 Body mass index (BMI) 37.0-37.9, adult: Secondary | ICD-10-CM | POA: Diagnosis not present

## 2019-07-09 DIAGNOSIS — Z7982 Long term (current) use of aspirin: Secondary | ICD-10-CM | POA: Diagnosis not present

## 2019-07-09 DIAGNOSIS — N1832 Chronic kidney disease, stage 3b: Secondary | ICD-10-CM | POA: Diagnosis present

## 2019-07-09 DIAGNOSIS — I252 Old myocardial infarction: Secondary | ICD-10-CM | POA: Diagnosis not present

## 2019-07-09 DIAGNOSIS — Z91128 Patient's intentional underdosing of medication regimen for other reason: Secondary | ICD-10-CM | POA: Diagnosis not present

## 2019-07-09 DIAGNOSIS — R0602 Shortness of breath: Secondary | ICD-10-CM | POA: Diagnosis present

## 2019-07-09 DIAGNOSIS — I255 Ischemic cardiomyopathy: Secondary | ICD-10-CM | POA: Diagnosis present

## 2019-07-09 DIAGNOSIS — I10 Essential (primary) hypertension: Secondary | ICD-10-CM | POA: Diagnosis not present

## 2019-07-09 DIAGNOSIS — E876 Hypokalemia: Secondary | ICD-10-CM | POA: Diagnosis not present

## 2019-07-09 DIAGNOSIS — D72829 Elevated white blood cell count, unspecified: Secondary | ICD-10-CM | POA: Diagnosis present

## 2019-07-09 DIAGNOSIS — Z7902 Long term (current) use of antithrombotics/antiplatelets: Secondary | ICD-10-CM | POA: Diagnosis not present

## 2019-07-09 DIAGNOSIS — Z8249 Family history of ischemic heart disease and other diseases of the circulatory system: Secondary | ICD-10-CM | POA: Diagnosis not present

## 2019-07-09 DIAGNOSIS — E785 Hyperlipidemia, unspecified: Secondary | ICD-10-CM | POA: Diagnosis present

## 2019-07-09 DIAGNOSIS — I5043 Acute on chronic combined systolic (congestive) and diastolic (congestive) heart failure: Secondary | ICD-10-CM | POA: Diagnosis present

## 2019-07-09 DIAGNOSIS — E1122 Type 2 diabetes mellitus with diabetic chronic kidney disease: Secondary | ICD-10-CM | POA: Diagnosis present

## 2019-07-09 DIAGNOSIS — N179 Acute kidney failure, unspecified: Secondary | ICD-10-CM | POA: Diagnosis present

## 2019-07-09 DIAGNOSIS — E669 Obesity, unspecified: Secondary | ICD-10-CM | POA: Diagnosis present

## 2019-07-09 DIAGNOSIS — I130 Hypertensive heart and chronic kidney disease with heart failure and stage 1 through stage 4 chronic kidney disease, or unspecified chronic kidney disease: Secondary | ICD-10-CM | POA: Diagnosis present

## 2019-07-09 DIAGNOSIS — Z6835 Body mass index (BMI) 35.0-35.9, adult: Secondary | ICD-10-CM | POA: Diagnosis not present

## 2019-07-09 DIAGNOSIS — T501X6A Underdosing of loop [high-ceiling] diuretics, initial encounter: Secondary | ICD-10-CM | POA: Diagnosis present

## 2019-07-09 DIAGNOSIS — E7800 Pure hypercholesterolemia, unspecified: Secondary | ICD-10-CM | POA: Diagnosis present

## 2019-07-09 DIAGNOSIS — I2510 Atherosclerotic heart disease of native coronary artery without angina pectoris: Secondary | ICD-10-CM | POA: Diagnosis present

## 2019-07-09 DIAGNOSIS — Z90710 Acquired absence of both cervix and uterus: Secondary | ICD-10-CM | POA: Diagnosis not present

## 2019-07-09 DIAGNOSIS — I2102 ST elevation (STEMI) myocardial infarction involving left anterior descending coronary artery: Secondary | ICD-10-CM | POA: Diagnosis not present

## 2019-07-09 DIAGNOSIS — J45909 Unspecified asthma, uncomplicated: Secondary | ICD-10-CM | POA: Diagnosis present

## 2019-07-09 DIAGNOSIS — J9601 Acute respiratory failure with hypoxia: Secondary | ICD-10-CM | POA: Diagnosis present

## 2019-07-09 LAB — CBC
HCT: 34.2 % — ABNORMAL LOW (ref 36.0–46.0)
Hemoglobin: 10.9 g/dL — ABNORMAL LOW (ref 12.0–15.0)
MCH: 30.6 pg (ref 26.0–34.0)
MCHC: 31.9 g/dL (ref 30.0–36.0)
MCV: 96.1 fL (ref 80.0–100.0)
Platelets: 328 10*3/uL (ref 150–400)
RBC: 3.56 MIL/uL — ABNORMAL LOW (ref 3.87–5.11)
RDW: 13.5 % (ref 11.5–15.5)
WBC: 11.1 10*3/uL — ABNORMAL HIGH (ref 4.0–10.5)
nRBC: 0 % (ref 0.0–0.2)

## 2019-07-09 LAB — BASIC METABOLIC PANEL
Anion gap: 14 (ref 5–15)
BUN: 37 mg/dL — ABNORMAL HIGH (ref 8–23)
CO2: 29 mmol/L (ref 22–32)
Calcium: 9.1 mg/dL (ref 8.9–10.3)
Chloride: 93 mmol/L — ABNORMAL LOW (ref 98–111)
Creatinine, Ser: 1.62 mg/dL — ABNORMAL HIGH (ref 0.44–1.00)
GFR calc Af Amer: 35 mL/min — ABNORMAL LOW (ref >60)
GFR calc non Af Amer: 30 mL/min — ABNORMAL LOW (ref >60)
Glucose, Bld: 124 mg/dL — ABNORMAL HIGH (ref 70–99)
Potassium: 3.4 mmol/L — ABNORMAL LOW (ref 3.5–5.1)
Sodium: 136 mmol/L (ref 135–145)

## 2019-07-09 LAB — GLUCOSE, CAPILLARY
Glucose-Capillary: 153 mg/dL — ABNORMAL HIGH (ref 70–99)
Glucose-Capillary: 95 mg/dL (ref 70–99)
Glucose-Capillary: 104 mg/dL — ABNORMAL HIGH (ref 70–99)
Glucose-Capillary: 147 mg/dL — ABNORMAL HIGH (ref 70–99)

## 2019-07-09 LAB — MAGNESIUM: Magnesium: 2.5 mg/dL — ABNORMAL HIGH (ref 1.7–2.4)

## 2019-07-09 MED ORDER — POTASSIUM CHLORIDE 20 MEQ PO PACK
40.0000 meq | PACK | Freq: Once | ORAL | Status: AC
  Administered 2019-07-09: 40 meq via ORAL
  Filled 2019-07-09: qty 2

## 2019-07-09 MED ORDER — POTASSIUM CHLORIDE CRYS ER 20 MEQ PO TBCR
20.0000 meq | EXTENDED_RELEASE_TABLET | Freq: Every day | ORAL | Status: DC
  Administered 2019-07-10: 09:00:00 20 meq via ORAL
  Filled 2019-07-09: qty 1

## 2019-07-09 MED ORDER — VITAMIN B-12 1000 MCG PO TABS
500.0000 ug | ORAL_TABLET | Freq: Every day | ORAL | Status: DC
  Administered 2019-07-10: 09:00:00 500 ug via ORAL
  Filled 2019-07-09: qty 1

## 2019-07-09 MED ORDER — CYANOCOBALAMIN 1000 MCG/ML IJ SOLN
1000.0000 ug | Freq: Once | INTRAMUSCULAR | Status: AC
  Administered 2019-07-09: 1000 ug via INTRAMUSCULAR
  Filled 2019-07-09: qty 1

## 2019-07-09 MED ORDER — FOLIC ACID 1 MG PO TABS
1.0000 mg | ORAL_TABLET | Freq: Every day | ORAL | Status: DC
  Administered 2019-07-09 – 2019-07-10 (×2): 1 mg via ORAL
  Filled 2019-07-09 (×2): qty 1

## 2019-07-09 MED ORDER — FUROSEMIDE 40 MG PO TABS
40.0000 mg | ORAL_TABLET | Freq: Every day | ORAL | Status: DC
  Administered 2019-07-10: 40 mg via ORAL
  Filled 2019-07-09: qty 1

## 2019-07-09 NOTE — Progress Notes (Signed)
PT reported patient's O2 sat 97% on RA. Patient taken off oxygen, will continue to monitor.

## 2019-07-09 NOTE — Progress Notes (Addendum)
Progress Note  Patient Name: Terry Hanson Date of Encounter: 07/09/2019  Primary Cardiologist: Lesleigh Noe, MD   Subjective   She denies chest pain and shortness of breath and feels like she is back to her old self.  Inpatient Medications    Scheduled Meds: . aspirin  81 mg Oral Daily  . carvedilol  3.125 mg Oral BID WC  . furosemide  40 mg Intravenous BID  . insulin aspart  0-15 Units Subcutaneous TID WC  . lisinopril  2.5 mg Oral Daily  . rosuvastatin  40 mg Oral Daily  . ticagrelor  90 mg Oral BID   Continuous Infusions:  PRN Meds: acetaminophen **OR** acetaminophen, nitroGLYCERIN, ondansetron **OR** ondansetron (ZOFRAN) IV   Vital Signs    Vitals:   07/08/19 2145 07/09/19 0020 07/09/19 0130 07/09/19 0428  BP:  (!) 109/36 104/76 (!) 130/51  Pulse:  71 71 69  Resp:  20 16 20   Temp:  97.9 F (36.6 C)  97.6 F (36.4 C)  TempSrc:  Oral  Oral  SpO2: 98% 100% 100% 100%  Weight:    97.4 kg  Height:        Intake/Output Summary (Last 24 hours) at 07/09/2019 0907 Last data filed at 07/09/2019 0500 Gross per 24 hour  Intake 240 ml  Output 1000 ml  Net -760 ml   Filed Weights   07/08/19 0143 07/09/19 0428  Weight: 97.7 kg 97.4 kg   07/11/19, RN was present throughout the entirety of the encounter.  Telemetry    Sinus rhythm with PVCs- Personally Reviewed  ECG    No new tracings- Personally Reviewed  Physical Exam   GEN: No acute distress.   Neck: No JVD Cardiac: RRR, no murmurs, rubs, or gallops.  Respiratory: Clear to auscultation bilaterally. GI: Soft, nontender, non-distended  MS: No edema; No deformity. Neuro:  Nonfocal  Psych: Normal affect   Labs    Chemistry Recent Labs  Lab 07/08/19 0204 07/09/19 0527  NA 129* 136  K 3.9 3.4*  CL 96* 93*  CO2 23 29  GLUCOSE 167* 124*  BUN 27* 37*  CREATININE 1.41* 1.62*  CALCIUM 8.7* 9.1  PROT 7.5  --   ALBUMIN 3.4*  --   AST 41  --   ALT 57*  --   ALKPHOS 43  --   BILITOT  1.6*  --   GFRNONAA 36* 30*  GFRAA 42* 35*  ANIONGAP 10 14     Hematology Recent Labs  Lab 07/08/19 0204 07/09/19 0527  WBC 14.8* 11.1*  RBC 3.57* 3.56*  HGB 11.1* 10.9*  HCT 34.0* 34.2*  MCV 95.2 96.1  MCH 31.1 30.6  MCHC 32.6 31.9  RDW 13.7 13.5  PLT 342 328    Cardiac EnzymesNo results for input(s): TROPONINI in the last 168 hours. No results for input(s): TROPIPOC in the last 168 hours.   BNP Recent Labs  Lab 07/08/19 0204  BNP 1,020.0*     DDimer No results for input(s): DDIMER in the last 168 hours.   Radiology    DG Chest Port 1 View  Result Date: 07/08/2019 CLINICAL DATA:  Shortness of breath. Recent myocardial infarction. EXAM: PORTABLE CHEST 1 VIEW COMPARISON:  Radiograph 06/29/2019 FINDINGS: Overlying artifact from life vest/monitoring device. Low lung volumes. Mild cardiomegaly. Prior perihilar opacities have near completely resolved likely pulmonary edema. No pleural fluid, pneumothorax, or confluent airspace disease. IMPRESSION: Mild cardiomegaly. Resolved pulmonary edema from exam earlier this month. Electronically Signed  By: Keith Rake M.D.   On: 07/08/2019 02:42    Cardiac Studies   Cardiac Catheterization: 06/29/2019  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  99% stenosis/thrombotic with TIMI I flow reduced to 0% with TIMI grade III flow following stent implantation using a 32 x 2.5 mm diameter onyx postdilated to 3.0 mm throughout the proximal two thirds of the stent. The LAD is otherwise widely patent.  Less than 20% proximal left main  Small first obtuse marginal with proximal 70% narrowing. Mid to distal circumflex with 60% narrowing.  Right coronary arises somewhat anterior. There is 40 to 50% proximal/ostial narrowing. There is 70% hazy eccentric mid RCA stenosis.  LVEDP 40, consistent with acute systolic heart failure. Prior status of the left ventricle was not  known.  RECOMMENDATIONS:   Management of acute presumed systolic heart failure: BiPAP; IV Lasix; Foley catheter; IV nitroglycerin 10-100 mics to keep systolic blood pressure 956 to 120 mmHg.  High intensity statin therapy  Once volume overload has resolved, beta-blocker and angiotensin receptor/ACE/Arni therapy should be considered  2D Doppler echocardiogram to assess LV function in a.m.  Medical therapy of right coronary and circumflex disease.  Aggressive risk factor modification.  Echocardiogram: 06/29/2019 IMPRESSIONS    1. Left ventricular ejection fraction, by estimation, is 30 to 35%. The  left ventricle has moderately decreased function. The left ventricle  demonstrates regional wall motion abnormalities (see scoring  diagram/findings for description). There is  moderate left ventricular hypertrophy. Left ventricular diastolic  parameters are consistent with Grade I diastolic dysfunction (impaired  relaxation). There is mild dyskinesis of the left ventricular, apex. There  is severe hypokinesis of the left  ventricular, mid-apical anterior wall, anteroseptal wall, inferior wall  and inferoseptal wall. No intracavitary thrombus is seen, but images are  suboptimal.  2. Right ventricular systolic function is normal. The right ventricular  size is normal. There is mildly elevated pulmonary artery systolic  pressure. The estimated right ventricular systolic pressure is 21.3 mmHg.  3. Left atrial size was mildly dilated.  4. The mitral valve is normal in structure. No evidence of mitral valve  regurgitation.  5. Aortic valve gradients are underestimated due to poor left ventricular  systolic function. The aortic valve is tricuspid. Aortic valve  regurgitation is not visualized. Mild aortic valve stenosis.  6. The inferior vena cava is dilated in size with <50% respiratory  variability, suggesting right atrial pressure of 15 mmHg.    Patient Profile     79  y.o. female with past medical history of CAD (s/p recent STEMI on 06/29/2019 with DES to proximal-LAD), ischemic cardiomyopathy (EF 30-35% by echo earlier this month), HTN, HLD and Type 2 DM who is being seen today for evaluation of CHF at the request of Dr. Olevia Bowens.   Assessment & Plan    1. 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 - BNP elevated to 1020 (previously 37.2 during last admission). CXR with mild cardiomegaly. She is currently on IV Lasix 40 mg twice daily with 1.66 L output in last 24 hrs. Wt down to 214 lbs. BUN up to 37, creatinine up to 1.62 (1.41 yesterday). I will stop IV Lasix and start oral Lasix 40 mg daily on 4/23. She appears euvolemic. Continue to follow daily weights and I&O's.  Continue  low dose lisinopril and Coreg. She does have a LifeVest in place given her ICM.  2. CAD/Elevated Troponin Values - s/p recent STEMI on 06/29/2019 with DES to proximal-LAD.    She currently denies shortness of breath and chest pain.  - Initial HS Troponin 2827 with repeat of 2362 which is trending down from last week. EKG shows NSR, HR 83 with anterior infarct pattern. -I spoke to the patient and daughter at length about the importance of Brilinta in the setting of recent anterior wall STEMI and proximal LAD drug-eluting stent placement.  If symptoms persist in spite of euvolemia, Brilinta could be switched to clopidogrel with a 300 mg loading dose followed by 75 mg daily. -Continue aspirin and statin along with carvedilol.  3. HTN - BP has been well-controlled.  Continue carvedilol and low-dose lisinopril.    4. HLD - FLP last month showed total cholesterol 266, triglycerides 117, HDL 51 and LDL 192.  She was started on Crestor 40 mg daily. Will need repeat FLP and LFT's in 6-8 weeks.   5. Type 2 DM - Hgb  A1c at 6.5 when checked earlier this month.   6. Hypokalemia -Potassium 3.4.  I will order replacement therapy.    For questions or updates, please contact [facility] Please consult [email or web address] for contact info under Cardiology/STEMI.      Signed, Prentice Docker, MD  07/09/2019, 9:07 AM

## 2019-07-09 NOTE — Progress Notes (Signed)
PROGRESS NOTE  Terry Hanson ENM:[id number] DOB: 1941/01/18 DOA: 07/08/2019 PCP: [name], MD  Brief History:  79 year old female with a history of diabetes mellitus type 2, hyperlipidemia, systolic and diastolic CHF presents with 3 to 4-day history of shortness of breath, worsening lower extremity edema, and orthopnea.  The patient was recently admitted to the hospital from 06/29/2019 to 07/02/2019 with an STEMI.  Cardiac catheterization on 06/29/2019 showed a 99% stenosis of the proximal LAD.  Stent was placed with residual 0% stenosis.  The patient was discharged home with aspirin 81 mg, Brilinta, and carvedilol 12.5 mg twice daily as well as Crestor.  Since discharge, the patient has complained of progressive shortness of breath.  She denies any chest discomfort, fevers, chills, nausea, vomiting, diarrhea, abdominal pain. In the emergency department, the patient was afebrile hemodynamically stable with oxygen saturation 98% on room air.  Sodium was 129 with serum creatinine 1.41.  WBC was 14.1 with hemoglobin 11.1.  EKG shows sinus rhythm with no STT changes.  BNP was 1020.  Troponin was 2827.  The patient was given furosemide IV.  Cardiology was consulted to assist with management.  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 CBC -UA and urine culture-follow culture     Disposition Plan: Patient From: Home D/C Place: Home - 2-3  Days Barriers: Not Clinically Stable--remains fluid overloaded requiring IV lasix  Family Communication:   Daughter updated at bedside 4/21  Consultants:  cardiology  Code Status:  FULL  DVT Prophylaxis:  Seltzer Heparin   Procedures: As Listed in Progress Note Above  Antibiotics: None      Subjective: Patient denies fevers, chills, headache, chest pain, dyspnea, nausea, vomiting, diarrhea, abdominal pain, dysuria, hematuria, hematochezia, and melena. Breathing much better than yesteray  Objective: Vitals:   07/09/19 0020 07/09/19 0130 07/09/19 0428 07/09/19 0900  BP: (!) 109/36 104/76 (!) 130/51   Pulse: 71 71 69   Resp: 20 16 20    Temp: 97.9 F (36.6 C)  97.6 F (36.4 C)   TempSrc: Oral  Oral   SpO2: 100% 100% 100% 100%  Weight:   97.4 kg   Height:        Intake/Output Summary (Last 24 hours) at 07/09/2019 1557 Last data filed at 07/09/2019 0500 Gross per 24 hour  Intake 240 ml  Output 1000 ml  Net -760 ml   Weight change: -0.3 kg Exam:   General:  Pt is alert, follows commands appropriately, not in acute distress  HEENT: No icterus, No thrush, No neck mass, [place]/AT  Cardiovascular: RRR, S1/S2, no rubs, no gallops  Respiratory: bibasilar rales. No wheeze  Abdomen: Soft/+BS, non tender, non distended, no guarding  Extremities: trace LE edema, No lymphangitis, No petechiae, No rashes, no synovitis  Data Reviewed: I have personally reviewed following labs and imaging studies Basic Metabolic Panel: Recent Labs  Lab 07/08/19 0204 07/09/19 0527  NA 129* 136  K 3.9 3.4*  CL 96* 93*  CO2 23 29  GLUCOSE 167* 124*    BUN 27* 37*  CREATININE 1.41* 1.62*  CALCIUM 8.7* 9.1  MG  --  2.5*   Liver Function Tests: Recent Labs  Lab 07/08/19 0204  AST 41  ALT 57*  ALKPHOS 43  BILITOT 1.6*  PROT 7.5  ALBUMIN 3.4*   No results for input(s): LIPASE, AMYLASE in the last 168 hours. No results for input(s): AMMONIA in the last 168 hours. Coagulation Profile: No results for input(s): INR, PROTIME in the last 168 hours. CBC: Recent Labs  Lab 07/08/19 0204 07/09/19 0527  WBC 14.8* 11.1*  NEUTROABS 11.4*  --   HGB 11.1* 10.9*  HCT 34.0* 34.2*  MCV 95.2 96.1  PLT 342 328   Cardiac Enzymes: No results for input(s): CKTOTAL, CKMB, CKMBINDEX, TROPONINI in the last 168 hours. BNP: Invalid input(s): POCBNP CBG: Recent Labs  Lab 07/08/19 1128 07/08/19 1630 07/08/19 2016 07/09/19 0912 07/09/19 1236  GLUCAP 130* 117* 143* 153* 95   HbA1C: No results for input(s): HGBA1C in the last 72 hours. Urine analysis:    Component Value Date/Time   COLORURINE AMBER (A) 07/08/2019 0737   APPEARANCEUR CLOUDY (A) 07/08/2019 0737   LABSPEC 1.019 07/08/2019 0737   PHURINE 5.0 07/08/2019 0737   GLUCOSEU NEGATIVE 07/08/2019 0737   HGBUR MODERATE (A) 07/08/2019 0737   BILIRUBINUR NEGATIVE 07/08/2019 0737   KETONESUR NEGATIVE 07/08/2019 0737   PROTEINUR 100 (A) 07/08/2019 0737   NITRITE NEGATIVE 07/08/2019 0737   LEUKOCYTESUR NEGATIVE 07/08/2019 0737   Sepsis Labs: @LABRCNTIP (procalcitonin:4,lacticidven:4) ) Recent Results (from the past 240 hour(s))  Respiratory Panel by RT PCR (Flu A&B, Covid) - Nasopharyngeal Swab     Status: None   Collection Time: 06/29/19  7:57 PM   Specimen: Nasopharyngeal Swab  Result Value Ref Range Status   SARS Coronavirus 2 by RT PCR NEGATIVE NEGATIVE Final    Comment: (NOTE) SARS-CoV-2 target nucleic acids are NOT DETECTED. The SARS-CoV-2 RNA is generally detectable in upper respiratoy specimens during the acute phase of infection. The lowest concentration of SARS-CoV-2  viral copies this assay can detect is 131 copies/mL. A negative result does not preclude SARS-Cov-2 infection and should not be used as the sole basis for treatment or other patient management decisions. A negative result may occur with  improper specimen collection/handling, submission of specimen other than nasopharyngeal swab, presence of viral mutation(s) within the areas targeted by this assay, and inadequate number of viral copies (<131 copies/mL). A negative result must be combined with clinical observations, patient history, and epidemiological information. The expected result is Negative. Fact Sheet for Patients:  https://www.moore.com/https://www.fda.gov/media/142436/download Fact Sheet for Healthcare Providers:  https://www.young.biz/https://www.fda.gov/media/142435/download This test is not yet ap proved or cleared by the [place] FDA and  has been authorized for detection and/or diagnosis of SARS-CoV-2 by FDA under an Emergency Use Authorization (EUA). This EUA will remain  in effect (meaning this test can be used) for the duration of the COVID-19 declaration under Section 564(b)(1) of the Act, 21 U.S.C. section 360bbb-3(b)(1), unless the authorization is terminated or revoked sooner.    Influenza A by PCR NEGATIVE NEGATIVE Final   Influenza B by PCR NEGATIVE NEGATIVE Final    Comment: (NOTE) The Xpert Xpress SARS-CoV-2/FLU/RSV assay is intended as an aid in  the  diagnosis of influenza from Nasopharyngeal swab specimens and  should not be used as a sole basis for treatment. Nasal washings and  aspirates are unacceptable for Xpert Xpress SARS-CoV-2/FLU/RSV  testing. Fact Sheet for Patients: https://www.moore.com/ Fact Sheet for Healthcare Providers: https://www.young.biz/ This test is not yet approved or cleared by the [place] FDA and  has been authorized for detection and/or diagnosis of SARS-CoV-2 by  FDA under an Emergency Use Authorization (EUA). This EUA will  remain  in effect (meaning this test can be used) for the duration of the  Covid-19 declaration under Section 564(b)(1) of the Act, 21  U.S.C. section 360bbb-3(b)(1), unless the authorization is  terminated or revoked. Performed at [facility], [address], [place], [place] [address]   MRSA PCR Screening     Status: Abnormal   Collection Time: 06/29/19  9:30 PM   Specimen: Nasopharyngeal  Result Value Ref Range Status   MRSA by PCR POSITIVE (A) NEGATIVE Final    Comment:        The GeneXpert MRSA Assay (FDA approved for NASAL specimens only), is one component of a comprehensive MRSA colonization surveillance program. It is not intended to diagnose MRSA infection nor to guide or monitor treatment for MRSA infections. RESULT CALLED TO, READ BACK BY AND VERIFIED WITH: A NINO RN 06/30/19 0058 JDW Performed at [facility], [address], [place], [place] [address]      Scheduled Meds: . aspirin  81 mg Oral Daily  . carvedilol  3.125 mg Oral BID WC  . [START ON 07/10/2019] furosemide  40 mg Oral Daily  . insulin aspart  0-15 Units Subcutaneous TID WC  . lisinopril  2.5 mg Oral Daily  . [START ON 07/10/2019] potassium chloride  20 mEq Oral Daily  . rosuvastatin  40 mg Oral Daily  . ticagrelor  90 mg Oral BID   Continuous Infusions:  Procedures/Studies: CARDIAC CATHETERIZATION  Result Date: 06/29/2019  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  99% stenosis/thrombotic with TIMI I flow reduced to 0% with TIMI grade III flow following stent implantation using a 32 x 2.5 mm diameter onyx postdilated to 3.0 mm throughout the proximal two thirds of the stent.  The LAD is otherwise widely patent.  Less than 20% proximal left main  Small first obtuse marginal with proximal 70% narrowing.  Mid to distal circumflex with 60% narrowing.  Right coronary arises somewhat anterior.  There is 40 to 50% proximal/ostial narrowing.  There  is 70% hazy eccentric mid RCA stenosis.  LVEDP 40, consistent with acute systolic heart failure.  Prior status of the left ventricle was not known. RECOMMENDATIONS:  Management of acute presumed systolic heart failure: BiPAP; IV Lasix; Foley catheter; IV nitroglycerin 10-100 mics to keep systolic blood pressure 110 to 120 mmHg.  High intensity statin therapy  Once volume overload has resolved, beta-blocker and angiotensin receptor/ACE/Arni therapy should be considered  2D Doppler echocardiogram to assess LV function in a.m.  Medical therapy of right coronary and circumflex disease.  Aggressive risk factor modification.  DG Chest Port 1 View  Result Date: 07/08/2019 CLINICAL DATA:  Shortness of breath. Recent myocardial infarction. EXAM: PORTABLE CHEST 1 VIEW COMPARISON:  Radiograph 06/29/2019 FINDINGS: Overlying artifact from life vest/monitoring device. Low lung volumes. Mild cardiomegaly. Prior perihilar opacities have near completely resolved likely pulmonary edema. No pleural fluid, pneumothorax, or confluent airspace disease. IMPRESSION: Mild cardiomegaly. Resolved pulmonary edema from exam earlier this month.  Electronically Signed   By: Narda Rutherford M.D.   On: 07/08/2019 02:42   DG Chest Port 1 View  Result Date: 06/29/2019 CLINICAL DATA:  Chest pain EXAM: PORTABLE CHEST 1 VIEW COMPARISON:  05/27/2018 FINDINGS: Hazy opacities throughout the left lung. Slight parahilar opacity in the right lung. Cardiomediastinal contours are normal. No pleural effusion or pneumothorax. IMPRESSION: Left-greater-than-right pulmonary opacities favored to indicate pulmonary edema. Electronically Signed   By: Deatra Robinson M.D.   On: 06/29/2019 22:15   ECHOCARDIOGRAM COMPLETE  Result Date: 06/30/2019    ECHOCARDIOGRAM REPORT   Patient Name:   Terry Hanson  Date of Exam: 06/30/2019 Medical Rec #:  [id number]  Height:       65.0 in Accession #:    [id number] Weight:       218.3 lb Date of Birth:  03-14-1941   BSA:           2.053 m Patient Age:    78 years   BP:           117/64 mmHg Patient Gender: F          HR:           92 bpm. Exam Location:  Inpatient Procedure: 2D Echo, Cardiac Doppler and Color Doppler Indications:    I50.23 Acute on chronic systolic (congestive) heart failure  History:        Patient has no prior history of Echocardiogram examinations.                 Cardiomegaly; Risk Factors:Hypertension and Diabetes.  Sonographer:    Elmarie Shiley Dance Referring Phys: [phone] Barry Dienes [facility]  Sonographer Comments: Suboptimal subcostal window. IMPRESSIONS  1. Left ventricular ejection fraction, by estimation, is 30 to 35%. The left ventricle has moderately decreased function. The left ventricle demonstrates regional wall motion abnormalities (see scoring diagram/findings for description). There is moderate left ventricular hypertrophy. Left ventricular diastolic parameters are consistent with Grade I diastolic dysfunction (impaired relaxation). There is mild dyskinesis of the left ventricular, apex. There is severe hypokinesis of the left ventricular, mid-apical anterior wall, anteroseptal wall, inferior wall and inferoseptal wall. No intracavitary thrombus is seen, but images are suboptimal.  2. Right ventricular systolic function is normal. The right ventricular size is normal. There is mildly elevated pulmonary artery systolic pressure. The estimated right ventricular systolic pressure is 36.2 mmHg.  3. Left atrial size was mildly dilated.  4. The mitral valve is normal in structure. No evidence of mitral valve regurgitation.  5. Aortic valve gradients are underestimated due to poor left ventricular systolic function. The aortic valve is tricuspid. Aortic valve regurgitation is not visualized. Mild aortic valve stenosis.  6. The inferior vena cava is dilated in size with <50% respiratory variability, suggesting right atrial pressure of 15 mmHg. FINDINGS  Left Ventricle: Left ventricular ejection fraction, by estimation, is  30 to 35%. The left ventricle has moderately decreased function. The left ventricle demonstrates regional wall motion abnormalities. Mild dyskinesis of the left ventricular, entire apical segment. Severe hypokinesis of the left ventricular, mid-apical anterior wall, anteroseptal wall, inferior wall and inferoseptal wall. The left ventricular internal cavity size was normal in size. There is moderate left ventricular hypertrophy. Left ventricular diastolic parameters are consistent with Grade I diastolic dysfunction (impaired relaxation). Normal left ventricular filling pressure. Right Ventricle: The right ventricular size is normal. No increase in right ventricular wall thickness. Right ventricular systolic function is normal. There is mildly elevated pulmonary artery systolic pressure. The tricuspid  regurgitant velocity is 2.30  m/s, and with an assumed right atrial pressure of 15 mmHg, the estimated right ventricular systolic pressure is 36.2 mmHg. Left Atrium: Left atrial size was mildly dilated. Right Atrium: Right atrial size was normal in size. Pericardium: There is no evidence of pericardial effusion. Mitral Valve: The mitral valve is normal in structure. Mild mitral annular calcification. No evidence of mitral valve regurgitation. Tricuspid Valve: The tricuspid valve is normal in structure. Tricuspid valve regurgitation is trivial. Aortic Valve: Aortic valve gradients are underestimated due to poor left ventricular systolic function. The aortic valve is tricuspid. Aortic valve regurgitation is not visualized. Mild aortic stenosis is present. Aortic valve mean gradient measures 7.7 mmHg. Aortic valve peak gradient measures 15.3 mmHg. Dimensionless index is 0.43. Pulmonic Valve: The pulmonic valve was not well visualized. Pulmonic valve regurgitation is not visualized. Aorta: The aortic root and ascending aorta are structurally normal, with no evidence of dilitation. Venous: The inferior vena cava is dilated  in size with less than 50% respiratory variability, suggesting right atrial pressure of 15 mmHg. IAS/Shunts: No atrial level shunt detected by color flow Doppler.  LEFT VENTRICLE PLAX 2D LVIDd:         2.70 cm  Diastology LVIDs:         2.10 cm  LV e' lateral:   5.00 cm/s LV PW:         1.70 cm  LV E/e' lateral: 10.4 LV IVS:        1.40 cm  LV e' medial:    3.81 cm/s LVOT diam:     1.90 cm  LV E/e' medial:  13.6 LV SV:         51 LV SV Index:   25 LVOT Area:     2.84 cm  RIGHT VENTRICLE             IVC RV Basal diam:  2.10 cm     IVC diam: 2.00 cm RV S prime:     12.50 cm/s TAPSE (M-mode): 2.0 cm LEFT ATRIUM           Index       RIGHT ATRIUM          Index LA diam:      3.70 cm 1.80 cm/m  RA Area:     9.22 cm LA Vol (A2C): 54.6 ml 26.60 ml/m RA Volume:   18.10 ml 8.82 ml/m LA Vol (A4C): 44.5 ml 21.68 ml/m  AORTIC VALVE AV Area (Vmax): 1.23 cm AV Vmax:        195.46 cm/s AV Peak Grad:   15.3 mmHg AV Mean Grad:   7.7 mmHg LVOT Vmax:      85.00 cm/s LVOT Vmean:     56.300 cm/s LVOT VTI:       0.180 m  AORTA Ao Root diam: 3.00 cm Ao Asc diam:  3.40 cm MITRAL VALVE                TRICUSPID VALVE MV Area (PHT): 3.99 cm     TR Peak grad:   21.2 mmHg MV Decel Time: 190 msec     TR Vmax:        230.00 cm/s MV E velocity: 52.00 cm/s MV A velocity: 106.00 cm/s  SHUNTS MV E/A ratio:  0.49         Systemic VTI:  0.18 m  Systemic Diam: 1.90 cm Sanda Klein MD Electronically signed by Sanda Klein MD Signature Date/Time: 06/30/2019/9:21:52 AM    Final     Orson Eva, DO  [facility]  If 7PM-7AM, please contact night-coverage [email or web address] Password [facility] 07/09/2019, 3:57 PM   LOS: 0 days

## 2019-07-09 NOTE — TOC Progression Note (Addendum)
Transition of Care [facility]) - Progression Note    Patient Details  Name: Terry Hanson MRN: [id number] Date of Birth: 1940-04-13  [facility]) CM/SW Contact  Halyn Flaugher, Chrystine Oiler, RN Phone Number: 07/09/2019, 1:05 PM  Clinical Narrative:   Patient agreeable to home health. Will reach out to agencies who can accept her insurance.  Referral accepted by [facility] with [facility].     Expected Discharge Plan: Home/Self Care Barriers to Discharge: Continued Medical Work up  Expected Discharge Plan and Services Expected Discharge Plan: Home/Self Care       Living arrangements for the past 2 months: Mobile Home                                       Social Determinants of Health (SDOH) Interventions    Readmission Risk Interventions No flowsheet data found.

## 2019-07-09 NOTE — Evaluation (Addendum)
Physical Therapy Evaluation Patient Details Name: MIJA EFFERTZ MRN: [id number] DOB: 1941-01-31 Today's Date: 07/09/2019   History of Present Illness  Terry Hanson is a 79 y.o. female with medical history significant of osteoarthritis, asthma, type 2 diabetes, cardiomegaly, hyperlipidemia, CAD who was admitted on 06/29/2019 to [facility] due to acute pulmonary edema and STEMI undergoing cardiac cath and stent placement (see procedure note below) who is coming to the emergency department due to progressively worse dyspnea associated with lower extremities pitting edema, orthopnea fatigue since she was discharged from the hospital on 07/02/2019.    Clinical Impression  Pt admitted with above diagnosis. Pt able to come to EOB with HOB elevated and no physical assistance. Pt ambulates to restroom to void bladder and back to bedside chair without symptoms, initial unsteadiness upon standing from seated surface but improves with time. Pt attempts 2nd walking bout out of room but reports "wooziness" and "tired" after 30 ft requiring return to sitting and return to room. Pt on RA with SpO2 97-100% with mobility. As therapist exiting room, pt calls out reporting "dull headache" and requests nursing check her BP; therapist returned to room with RN who placed cuff on R forearm and BP reading 96/39mmHg. Pt requests to remain up in recliner with BLE elevated, therapist educated pt on ankle pumps with good performance and pt reports improvement in headache. Therapist returned supplemental O2 per RN and educated pt on nursing will continue to monitor her BP, please call nursing if headache gets worse or any other needs arise. Pt currently with functional limitations due to the deficits listed below (see PT Problem List). Pt will benefit from skilled PT to increase their independence and safety with mobility to allow discharge to the venue listed below.       Follow Up Recommendations Home health PT;Supervision - Intermittent     Equipment Recommendations  None recommended by PT    Recommendations for Other Services       Precautions / Restrictions Precautions Precautions: Fall Precaution Comments: life vest, monitor vitals Restrictions Weight Bearing Restrictions: No      Mobility  Bed Mobility Overal bed mobility: Needs Assistance Bed Mobility: Supine to Sit     Supine to sit: Supervision;HOB elevated     General bed mobility comments: slow, labored movement to come to EOB, use of elevated HOB due to baseline sleeping in recliner  Transfers Overall transfer level: Needs assistance Equipment used: Rolling walker (2 wheeled) Transfers: Sit to/from [employer] Sit to Stand: Min guard Stand pivot transfers: Min guard       General transfer comment: min guard due to initial unsteadiness upon standing, able to improve with time, cues for hand placement when rising from seated surfaces  Ambulation/Gait Ambulation/Gait assistance: Min guard Gait Distance (Feet): 30 Feet(15 ft) Assistive device: Rolling walker (2 wheeled) Gait Pattern/deviations: Step-through pattern;Decreased stride length Gait velocity: decreased   General Gait Details: short steps with minimal bil foot clearance, dependence on RW for steadying, reports "wooziness" and feeling "tired" with mild SOB noted during 88ft walk and no symptoms during 15 ft walk; on RA and O2 sat 97% with amb  Stairs            Wheelchair Mobility    Modified Rankin (Stroke Patients Only)       Balance Overall balance assessment: Needs assistance Sitting-balance support: Feet supported;No upper extremity supported Sitting balance-Leahy Scale: Good Sitting balance - Comments: seated EOB   Standing balance support: During functional activity;Bilateral  upper extremity supported Standing balance-Leahy Scale: Poor Standing balance comment: dependent on RW for steadying                Pertinent Vitals/Pain Pain  Assessment: No/denies pain    Home Living Family/patient expects to be discharged to:: Private residence Living Arrangements: Alone Available Help at Discharge: Family;Available 24 hours/day Type of Home: Mobile home Home Access: Stairs to enter Entrance Stairs-Rails: None Entrance Stairs-Number of Steps: 2 (1 step onto porch, 1 step into home) Home Layout: One level Home Equipment: Shower seat;Bedside commode;Walker - 4 wheels;Cane - quad      Prior Function Level of Independence: Independent with assistive device(s)   Gait / Transfers Assistance Needed: Pt reports sleeping in recliner, uses rollator for community distances, quad cane around the home and in the yard, occasionally no AD. Pt denies recent falls.  ADL's / Homemaking Assistance Needed: Pt reports independent with bathing, dressing, cooking, cleaning and driving short distances.  Comments: Pt reports daughter and granddaughter are close by to assist if she needs.     Hand Dominance   Dominant Hand: Right    Extremity/Trunk Assessment   Upper Extremity Assessment Upper Extremity Assessment: Overall WFL for tasks assessed    Lower Extremity Assessment Lower Extremity Assessment: Overall WFL for tasks assessed(BLE AROM WNL, grossly 3+/5)    Cervical / Trunk Assessment Cervical / Trunk Assessment: Normal  Communication   Communication: No difficulties  Cognition Arousal/Alertness: Awake/alert Behavior During Therapy: WFL for tasks assessed/performed Overall Cognitive Status: Within Functional Limits for tasks assessed               General Comments      Exercises General Exercises - Lower Extremity Ankle Circles/Pumps: Both;20 reps;Seated(BLE elevated in recliner)   Assessment/Plan    PT Assessment Patient needs continued PT services  PT Problem List Decreased activity tolerance;Decreased balance;Decreased mobility;Cardiopulmonary status limiting activity;Obesity       PT Treatment  Interventions DME instruction;Gait training;Stair training;Functional mobility training;Therapeutic activities;Therapeutic exercise;Balance training;Neuromuscular re-education;Patient/family education;Manual techniques    PT Goals (Current goals can be found in the Care Plan section)  Acute Rehab PT Goals Patient Stated Goal: return home, stay independent PT Goal Formulation: With patient Time For Goal Achievement: 07/16/19 Potential to Achieve Goals: Good    Frequency Min 3X/week   Barriers to discharge        Co-evaluation               AM-PAC PT "6 Clicks" Mobility  Outcome Measure Help needed turning from your back to your side while in a flat bed without using bedrails?: None Help needed moving from lying on your back to sitting on the side of a flat bed without using bedrails?: A Little Help needed moving to and from a bed to a chair (including a wheelchair)?: A Little Help needed standing up from a chair using your arms (e.g., wheelchair or bedside chair)?: A Little Help needed to walk in hospital room?: A Little Help needed climbing 3-5 steps with a railing? : A Lot 6 Click Score: 18    End of Session   Activity Tolerance: Patient tolerated treatment well;Patient limited by fatigue Patient left: in chair;with call bell/phone within reach Nurse Communication: Mobility status;Other (comment)(consulted RN for BP and SpO2) PT Visit Diagnosis: Unsteadiness on feet (R26.81);Other abnormalities of gait and mobility (R26.89)    Time: 3976-7341 PT Time Calculation (min) (ACUTE ONLY): 44 min   Charges:   PT Evaluation $PT Eval Moderate Complexity: 1  Mod          Tori Bryon Parker PT, DPT 07/09/19, 12:03 PM [phone]

## 2019-07-10 LAB — BASIC METABOLIC PANEL
Anion gap: 13 (ref 5–15)
BUN: 48 mg/dL — ABNORMAL HIGH (ref 8–23)
CO2: 27 mmol/L (ref 22–32)
Calcium: 8.8 mg/dL — ABNORMAL LOW (ref 8.9–10.3)
Chloride: 92 mmol/L — ABNORMAL LOW (ref 98–111)
Creatinine, Ser: 1.62 mg/dL — ABNORMAL HIGH (ref 0.44–1.00)
GFR calc Af Amer: 35 mL/min — ABNORMAL LOW (ref >60)
GFR calc non Af Amer: 30 mL/min — ABNORMAL LOW (ref >60)
Glucose, Bld: 137 mg/dL — ABNORMAL HIGH (ref 70–99)
Potassium: 3.5 mmol/L (ref 3.5–5.1)
Sodium: 132 mmol/L — ABNORMAL LOW (ref 135–145)

## 2019-07-10 LAB — GLUCOSE, CAPILLARY
Glucose-Capillary: 117 mg/dL — ABNORMAL HIGH (ref 70–99)
Glucose-Capillary: 104 mg/dL — ABNORMAL HIGH (ref 70–99)

## 2019-07-10 LAB — MAGNESIUM: Magnesium: 2.4 mg/dL (ref 1.7–2.4)

## 2019-07-10 MED ORDER — FUROSEMIDE 40 MG PO TABS: 40.0000 mg | ORAL_TABLET | Freq: Every day | ORAL | 1 refills | Status: DC

## 2019-07-10 MED ORDER — CEFDINIR 300 MG PO CAPS: 300.0000 mg | ORAL_CAPSULE | Freq: Two times a day (BID) | ORAL | 0 refills | Status: DC

## 2019-07-10 MED ORDER — POTASSIUM CHLORIDE CRYS ER 20 MEQ PO TBCR: 20.0000 meq | EXTENDED_RELEASE_TABLET | Freq: Every day | ORAL | 0 refills | Status: DC

## 2019-07-10 MED ORDER — CYANOCOBALAMIN 500 MCG PO TABS: 500.0000 ug | ORAL_TABLET | Freq: Every day | ORAL | Status: DC

## 2019-07-10 MED ORDER — LISINOPRIL 2.5 MG PO TABS: 2.5000 mg | ORAL_TABLET | Freq: Every day | ORAL | 1 refills | Status: DC

## 2019-07-10 MED ORDER — CARVEDILOL 3.125 MG PO TABS: 3.1250 mg | ORAL_TABLET | Freq: Two times a day (BID) | ORAL | 1 refills | Status: DC

## 2019-07-10 NOTE — TOC Transition Note (Signed)
Transition of Care [facility]) - CM/SW Discharge Note   Patient Details  Name: Terry Hanson MRN: [id number] Date of Birth: 07-05-1940  [facility] of [facility] CM/SW Contact:  Ziya Coonrod, Chrystine Oiler, RN Phone Number: 07/10/2019, 12:06 PM   Clinical Narrative:   [facility] information supplied to patient's daughter. Notified patient/daughter that [facility] would be calling to set up for visit for PT services.     Final next level of care: Home/Self Care Barriers to Discharge: Continued Medical Work up   Patient Goals and CMS Choice Patient states their goals for this hospitalization and ongoing recovery are:: Return home      Discharge Placement                       Discharge Plan and Services                                     Social Determinants of Health (SDOH) Interventions     Readmission Risk Interventions No flowsheet data found.

## 2019-07-10 NOTE — Plan of Care (Signed)

## 2019-07-10 NOTE — Progress Notes (Signed)
Progress Note  Patient Name: Terry Hanson Date of Encounter: 07/10/2019  Primary Cardiologist: Lesleigh Noe, MD   Subjective   Denies chest pain and shortness of breath. Wants to go home. Denies dysuria.  Inpatient Medications    Scheduled Meds: . aspirin  81 mg Oral Daily  . carvedilol  3.125 mg Oral BID WC  . folic acid  1 mg Oral Daily  . furosemide  40 mg Oral Daily  . insulin aspart  0-15 Units Subcutaneous TID WC  . lisinopril  2.5 mg Oral Daily  . potassium chloride  20 mEq Oral Daily  . rosuvastatin  40 mg Oral Daily  . ticagrelor  90 mg Oral BID  . vitamin B-12  500 mcg Oral Daily   Continuous Infusions:  PRN Meds: acetaminophen **OR** acetaminophen, nitroGLYCERIN, ondansetron **OR** ondansetron (ZOFRAN) IV   Vital Signs    Vitals:   07/09/19 0900 07/09/19 2046 07/10/19 0500 07/10/19 0542  BP:  (!) 97/37  (!) 135/50  Pulse:  71  66  Resp:  17  16  Temp:  98.4 F (36.9 C)  98.4 F (36.9 C)  TempSrc:  Oral  Oral  SpO2: 100% 97%  98%  Weight:   101.3 kg   Height:        Intake/Output Summary (Last 24 hours) at 07/10/2019 0844 Last data filed at 07/09/2019 2359 Gross per 24 hour  Intake --  Output 650 ml  Net -650 ml   Filed Weights   07/08/19 0143 07/09/19 0428 07/10/19 0500  Weight: 97.7 kg 97.4 kg 101.3 kg   Terry Corporal, RN was present throughout the entirety of the encounter.  Telemetry    Sinus rhythm with artifact- Personally Reviewed   Physical Exam   GEN: No acute distress.   Neck: No JVD Cardiac: RRR, no murmurs, rubs, or gallops.  Respiratory: Clear to auscultation bilaterally. GI: Soft, nontender, non-distended  MS: No edema; No deformity. Neuro:  Nonfocal  Psych: Normal affect   Labs    Chemistry Recent Labs  Lab 07/08/19 0204 07/09/19 0527 07/10/19 0501  NA 129* 136 132*  K 3.9 3.4* 3.5  CL 96* 93* 92*  CO2 23 29 27   GLUCOSE 167* 124* 137*  BUN 27* 37* 48*  CREATININE 1.41* 1.62* 1.62*  CALCIUM 8.7*  9.1 8.8*  PROT 7.5  --   --   ALBUMIN 3.4*  --   --   AST 41  --   --   ALT 57*  --   --   ALKPHOS 43  --   --   BILITOT 1.6*  --   --   GFRNONAA 36* 30* 30*  GFRAA 42* 35* 35*  ANIONGAP 10 14 13      Hematology Recent Labs  Lab 07/08/19 0204 07/09/19 0527  WBC 14.8* 11.1*  RBC 3.57* 3.56*  HGB 11.1* 10.9*  HCT 34.0* 34.2*  MCV 95.2 96.1  MCH 31.1 30.6  MCHC 32.6 31.9  RDW 13.7 13.5  PLT 342 328    Cardiac EnzymesNo results for input(s): TROPONINI in the last 168 hours. No results for input(s): TROPIPOC in the last 168 hours.   BNP Recent Labs  Lab 07/08/19 0204  BNP 1,020.0*     DDimer No results for input(s): DDIMER in the last 168 hours.   Radiology    No results found.  Cardiac Studies   Cardiac Catheterization: 06/29/2019  Anterior ST elevation myocardial infarction due to occlusion of the proximal  LAD.  99% stenosis/thrombotic with TIMI I flow reduced to 0% with TIMI grade III flow following stent implantation using a 32 x 2.5 mm diameter onyx postdilated to 3.0 mm throughout the proximal two thirds of the stent. The LAD is otherwise widely patent.  Less than 20% proximal left main  Small first obtuse marginal with proximal 70% narrowing. Mid to distal circumflex with 60% narrowing.  Right coronary arises somewhat anterior. There is 40 to 50% proximal/ostial narrowing. There is 70% hazy eccentric mid RCA stenosis.  LVEDP 40, consistent with acute systolic heart failure. Prior status of the left ventricle was not known.  RECOMMENDATIONS:   Management of acute presumed systolic heart failure: BiPAP; IV Lasix; Foley catheter; IV nitroglycerin 10-100 mics to keep systolic blood pressure 110 to 120 mmHg.  High intensity statin therapy  Once volume overload has resolved, beta-blocker and angiotensin receptor/ACE/Arni therapy should be considered  2D Doppler echocardiogram to assess LV function in a.m.  Medical therapy of right coronary and  circumflex disease.  Aggressive risk factor modification.  Echocardiogram: 06/29/2019 IMPRESSIONS    1. Left ventricular ejection fraction, by estimation, is 30 to 35%. The  left ventricle has moderately decreased function. The left ventricle  demonstrates regional wall motion abnormalities (see scoring  diagram/findings for description). There is  moderate left ventricular hypertrophy. Left ventricular diastolic  parameters are consistent with Grade I diastolic dysfunction (impaired  relaxation). There is mild dyskinesis of the left ventricular, apex. There  is severe hypokinesis of the left  ventricular, mid-apical anterior wall, anteroseptal wall, inferior wall  and inferoseptal wall. No intracavitary thrombus is seen, but images are  suboptimal.  2. Right ventricular systolic function is normal. The right ventricular  size is normal. There is mildly elevated pulmonary artery systolic  pressure. The estimated right ventricular systolic pressure is 36.2 mmHg.  3. Left atrial size was mildly dilated.  4. The mitral valve is normal in structure. No evidence of mitral valve  regurgitation.  5. Aortic valve gradients are underestimated due to poor left ventricular  systolic function. The aortic valve is tricuspid. Aortic valve  regurgitation is not visualized. Mild aortic valve stenosis.  6. The inferior vena cava is dilated in size with <50% respiratory  variability, suggesting right atrial pressure of 15 mmHg.    Patient Profile     79 y.o. female with past medical history of CAD (s/p recent STEMI on 06/29/2019 with DES to proximal-LAD), ischemic cardiomyopathy (EF 30-35% by echo earlier this month), HTN, HLD and Type 2 DM who is being seen today for evaluation of CHF at the request of Dr. Robb Matar.   Assessment & Plan    1. 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 - BNP elevated to 1020 (previously 37.2 during last admission). CXR with mild cardiomegaly. She is starting oral Lasix 40 mg daily today. Has had 650 cc output in last 24 hrs. Wt reported at 223 lbs today, 214 lbs yesterday (dout accuracy). BUN up to 48, creatinine stable at 1.62 (1.41 on 4/21). She appears euvolemic. Continue low dose lisinopril and Coreg. She does have a LifeVest in place given her ICM.  2. CAD/Elevated Troponin Values - s/p recent STEMI on 06/29/2019 with DES to proximal-LAD.    She currently denies shortness of breath and chest pain.  - Initial HS Troponin  2827 with repeat of 2362 which is trending down from last week. EKG shows NSR, HR 83 with anterior infarct pattern. -I spoke to the patient and daughter at length about the importance of Brilinta in the setting of recent anterior wall STEMI and proximal LAD drug-eluting stent placement.  If symptoms persist in spite of euvolemia, Brilinta could be switched to clopidogrel with a 300 mg loading dose followed by 75 mg daily. -Continue aspirin and statin along with carvedilol.  3. HTN - BP has been well-controlled.  Continue carvedilol and low-dose lisinopril.    4. HLD - FLP last month showed total cholesterol 266, triglycerides 117, HDL 51 and LDL 192.  She was started on Crestor 40 mg daily. Will need repeat FLP and LFT's in 6-8 weeks.   5. Type 2 DM - Hgb A1c at 6.5 when checked earlier this month.   6. Hypokalemia -Potassium 3.5. She is on daily replacement therapy.  [facility] will sign off.   Medication Recommendations:  As above Other recommendations (labs, testing, etc):  BMET next week Follow up as an outpatient:  Will arrange   For questions or updates, please contact [place] Please consult [email or web address] for contact info under Cardiology/STEMI.      Signed, Kate Sable, MD  07/10/2019, 8:44 AM

## 2019-07-10 NOTE — Progress Notes (Signed)
CRITICAL VALUE ALERT  Critical Value: gram negative rods UA  Date & Time Notied:  07/10/19 0130  Provider Notified: S GADHIA  Orders Received/Actions taken: no new orders

## 2019-07-10 NOTE — Discharge Summary (Addendum)
Physician Discharge Summary  Terry Hanson GNF:[id number] DOB: [birth year] DOA: 07/08/2019  PCP: [name], MD  Admit date: 07/08/2019 Discharge date: 07/10/2019  Admitted From: Home Disposition:  Home   Recommendations for Outpatient Follow-up:  1. Follow up with PCP in 1-2 weeks 2. Please obtain BMP/CBC in one week   Home Health: YES Equipment/Devices: HHPT  Discharge Condition: Stable CODE STATUS: FULL Diet recommendation: Heart Healthy   Brief/Interim Summary: 79 year old female with a history of diabetes mellitus type 2, hyperlipidemia, systolic and diastolic CHF presents with 3 to 4-day history of shortness of breath, worsening lower extremity edema, and orthopnea. The patient was recently admitted to the hospital from 06/29/2019 to 07/02/2019 with an STEMI. Cardiac catheterization on 06/29/2019 showed a 99% stenosis of the proximal LAD. Stent was placed with residual 0% stenosis. The patient was discharged home with aspirin 81 mg, Brilinta, and carvedilol 12.5 mg twice daily as well as Crestor. Since discharge, the patient has complained of progressive shortness of breath. She denies any chest discomfort, fevers, chills, nausea, vomiting, diarrhea, abdominal pain. In the emergency department, the patient was afebrile hemodynamically stable with oxygen saturation 98% on room air. Sodium was 129 with serum creatinine 1.41. WBC was 14.1 with hemoglobin 11.1. EKG shows sinus rhythm with no STTchanges. BNP was 1020. Troponin was 2827. The patient was given furosemide IV. Cardiology was consulted to assist with management.  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 -baseline creatinine 1.0-1.3 -monitor with diuresis -will need to tolerated worse renal function for euvolemia -serum creatinine 1.62 on day of d/c -discontinue metformin  Ischemic cardiomyopathy -06/29/2019 cath shows 99% proximal LAD stenosis -Cardiology consult as discussed above -Continue aspirin, Brilinta, carvedilol, statin -Continue low-dose lisinopril  Diabetes mellitus type 2 -NovoLog sliding scale -Holding Metformin -07/01/2019 hemoglobin A1c 6.5  Hyperlipidemia -Continue statin  Leukocytosis -Likely stress demargination -Patient is afebrile hemodynamically stable -Monitor off antibiotics -A.m. CBC -UA 11-20 WBC -urine culture--GNR -empiric cefdinir    Discharge Instructions   Allergies as of 07/10/2019      Reactions   Codeine Hives   Penicillins Hives   Sulfa Antibiotics Itching   Codeine Itching, Rash   Penicillins Itching, Rash   Did it involve swelling of the face/tongue/throat, SOB, or low BP? Yes Did it involve sudden or severe rash/hives, skin peeling, or any reaction on the inside of your mouth or nose? Yes Did you need to seek medical attention at a hospital or doctor's office? Yes When did it last happen? Over 10 years If all above answers are "NO", may proceed with cephalosporin use.   Sulfa Antibiotics Itching, Rash      Medication List    STOP taking these medications   metFORMIN 500 MG tablet Commonly known as: GLUCOPHAGE     TAKE these medications   aspirin 81 MG chewable tablet Chew 1 tablet (81 mg total) by mouth daily.   carvedilol 3.125 MG tablet Commonly known as: COREG Take 1 tablet (3.125 mg total) by mouth 2 (two) times daily with a meal. What changed:   medication strength  how much to take   cefdinir 300 MG capsule Commonly known as: OMNICEF Take 1  capsule (300 mg total) by mouth 2 (two) times daily.   furosemide 40 MG  tablet Commonly known as: LASIX Take 1 tablet (40 mg total) by mouth daily. Start taking on: July 11, 2019   lisinopril 2.5 MG tablet Commonly known as: ZESTRIL Take 1 tablet (2.5 mg total) by mouth daily. Start taking on: July 11, 2019   nitroGLYCERIN 0.4 MG SL tablet Commonly known as: Nitrostat Place 1 tablet (0.4 mg total) under the tongue every 5 (five) minutes as needed.   potassium chloride SA 20 MEQ tablet Commonly known as: KLOR-CON Take 1 tablet (20 mEq total) by mouth daily. Start taking on: July 11, 2019   rosuvastatin 40 MG tablet Commonly known as: CRESTOR Take 1 tablet (40 mg total) by mouth daily.   ticagrelor 90 MG Tabs tablet Commonly known as: BRILINTA Take 1 tablet (90 mg total) by mouth 2 (two) times daily.   vitamin B-12 500 MCG tablet Commonly known as: CYANOCOBALAMIN Take 1 tablet (500 mcg total) by mouth daily. Start taking on: July 11, 2019      Follow-up Information    [place], [place] Follow up on 07/15/2019.   Specialty: Cardiology Why: Keep scheduled Cardiology follow-up for 07/15/2019 at 9:15 AM.  Contact information: [address] [place] [id number] [phone]        [facility] Follow [facility].   Why: home health PT Contact information: [address] [place] [phone]         Allergies  Allergen Reactions  . Codeine Hives  . Penicillins Hives  . Sulfa Antibiotics Itching  . Codeine Itching and Rash  . Penicillins Itching and Rash    Did it involve swelling of the face/tongue/throat, SOB, or low BP? Yes Did it involve sudden or severe rash/hives, skin peeling, or any reaction on the inside of your mouth or nose? Yes Did you need to seek medical attention at a hospital or doctor's office? Yes When did it last happen? Over 10 years If all above answers are "NO", may proceed with cephalosporin use.   . Sulfa Antibiotics Itching and Rash     Consultations:  cardiology   Procedures/Studies: CARDIAC CATHETERIZATION  Result Date: 06/29/2019  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  99% stenosis/thrombotic with TIMI I flow reduced to 0% with TIMI grade III flow following stent implantation using a 32 x 2.5 mm diameter onyx postdilated to 3.0 mm throughout the proximal two thirds of the stent.  The LAD is otherwise widely patent.  Less than 20% proximal left main  Small first obtuse marginal with proximal 70% narrowing.  Mid to distal circumflex with 60% narrowing.  Right coronary arises somewhat anterior.  There is 40 to 50% proximal/ostial narrowing.  There is 70% hazy eccentric mid RCA stenosis.  LVEDP 40, consistent with acute systolic heart failure.  Prior status of the left ventricle was not known. RECOMMENDATIONS:  Management of acute presumed systolic heart failure: BiPAP; IV Lasix; Foley catheter; IV nitroglycerin 10-100 mics to keep systolic blood pressure 110 to 120 mmHg.  High intensity statin therapy  Once volume overload has resolved, beta-blocker and angiotensin receptor/ACE/Arni therapy should be considered  2D Doppler echocardiogram to assess LV function in a.m.  Medical therapy of right coronary and circumflex disease.  Aggressive risk factor modification.  DG Chest Port 1 View  Result Date: 07/08/2019 CLINICAL DATA:  Shortness of breath. Recent myocardial infarction. EXAM:  PORTABLE CHEST 1 VIEW COMPARISON:  Radiograph 06/29/2019 FINDINGS: Overlying artifact from life vest/monitoring device. Low lung volumes. Mild cardiomegaly. Prior perihilar opacities have near completely resolved likely pulmonary edema. No pleural fluid, pneumothorax, or confluent airspace disease. IMPRESSION: Mild cardiomegaly. Resolved pulmonary edema from exam earlier this month. Electronically Signed   By: Narda Rutherford M.D.   On: 07/08/2019 02:42   DG Chest Port 1 View  Result Date:  06/29/2019 CLINICAL DATA:  Chest pain EXAM: PORTABLE CHEST 1 VIEW COMPARISON:  05/27/2018 FINDINGS: Hazy opacities throughout the left lung. Slight parahilar opacity in the right lung. Cardiomediastinal contours are normal. No pleural effusion or pneumothorax. IMPRESSION: Left-greater-than-right pulmonary opacities favored to indicate pulmonary edema. Electronically Signed   By: Deatra Robinson M.D.   On: 06/29/2019 22:15   ECHOCARDIOGRAM COMPLETE  Result Date: 06/30/2019    ECHOCARDIOGRAM REPORT   Patient Name:   Terry Hanson  Date of Exam: 06/30/2019 Medical Rec #:  [id number]  Height:       65.0 in Accession #:    [id number] Weight:       218.3 lb Date of Birth:  1940/05/06   BSA:          2.053 m Patient Age:    78 years   BP:           117/64 mmHg Patient Gender: F          HR:           92 bpm. Exam Location:  Inpatient Procedure: 2D Echo, Cardiac Doppler and Color Doppler Indications:    I50.23 Acute on chronic systolic (congestive) heart failure  History:        Patient has no prior history of Echocardiogram examinations.                 Cardiomegaly; Risk Factors:Hypertension and Diabetes.  Sonographer:    Elmarie Shiley Dance Referring Phys: [phone] Barry Dienes [facility]  Sonographer Comments: Suboptimal subcostal window. IMPRESSIONS  1. Left ventricular ejection fraction, by estimation, is 30 to 35%. The left ventricle has moderately decreased function. The left ventricle demonstrates regional wall motion abnormalities (see scoring diagram/findings for description). There is moderate left ventricular hypertrophy. Left ventricular diastolic parameters are consistent with Grade I diastolic dysfunction (impaired relaxation). There is mild dyskinesis of the left ventricular, apex. There is severe hypokinesis of the left ventricular, mid-apical anterior wall, anteroseptal wall, inferior wall and inferoseptal wall. No intracavitary thrombus is seen, but images are suboptimal.  2. Right ventricular systolic function is normal. The  right ventricular size is normal. There is mildly elevated pulmonary artery systolic pressure. The estimated right ventricular systolic pressure is 36.2 mmHg.  3. Left atrial size was mildly dilated.  4. The mitral valve is normal in structure. No evidence of mitral valve regurgitation.  5. Aortic valve gradients are underestimated due to poor left ventricular systolic function. The aortic valve is tricuspid. Aortic valve regurgitation is not visualized. Mild aortic valve stenosis.  6. The inferior vena cava is dilated in size with <50% respiratory variability, suggesting right atrial pressure of 15 mmHg. FINDINGS  Left Ventricle: Left ventricular ejection fraction, by estimation, is 30 to 35%. The left ventricle has moderately decreased function. The left ventricle demonstrates regional wall motion abnormalities. Mild dyskinesis of the left ventricular, entire apical segment. Severe hypokinesis of the left ventricular, mid-apical anterior wall, anteroseptal wall, inferior wall and inferoseptal wall. The left ventricular internal cavity size was normal in size. There is moderate left ventricular hypertrophy.  Left ventricular diastolic parameters are consistent with Grade I diastolic dysfunction (impaired relaxation). Normal left ventricular filling pressure. Right Ventricle: The right ventricular size is normal. No increase in right ventricular wall thickness. Right ventricular systolic function is normal. There is mildly elevated pulmonary artery systolic pressure. The tricuspid regurgitant velocity is 2.30  m/s, and with an assumed right atrial pressure of 15 mmHg, the estimated right ventricular systolic pressure is 51.8 mmHg. Left Atrium: Left atrial size was mildly dilated. Right Atrium: Right atrial size was normal in size. Pericardium: There is no evidence of pericardial effusion. Mitral Valve: The mitral valve is normal in structure. Mild mitral annular calcification. No evidence of mitral valve  regurgitation. Tricuspid Valve: The tricuspid valve is normal in structure. Tricuspid valve regurgitation is trivial. Aortic Valve: Aortic valve gradients are underestimated due to poor left ventricular systolic function. The aortic valve is tricuspid. Aortic valve regurgitation is not visualized. Mild aortic stenosis is present. Aortic valve mean gradient measures 7.7 mmHg. Aortic valve peak gradient measures 15.3 mmHg. Dimensionless index is 0.43. Pulmonic Valve: The pulmonic valve was not well visualized. Pulmonic valve regurgitation is not visualized. Aorta: The aortic root and ascending aorta are structurally normal, with no evidence of dilitation. Venous: The inferior vena cava is dilated in size with less than 50% respiratory variability, suggesting right atrial pressure of 15 mmHg. IAS/Shunts: No atrial level shunt detected by color flow Doppler.  LEFT VENTRICLE PLAX 2D LVIDd:         2.70 cm  Diastology LVIDs:         2.10 cm  LV e' lateral:   5.00 cm/s LV PW:         1.70 cm  LV E/e' lateral: 10.4 LV IVS:        1.40 cm  LV e' medial:    3.81 cm/s LVOT diam:     1.90 cm  LV E/e' medial:  13.6 LV SV:         51 LV SV Index:   25 LVOT Area:     2.84 cm  RIGHT VENTRICLE             IVC RV Basal diam:  2.10 cm     IVC diam: 2.00 cm RV S prime:     12.50 cm/s TAPSE (M-mode): 2.0 cm LEFT ATRIUM           Index       RIGHT ATRIUM          Index LA diam:      3.70 cm 1.80 cm/m  RA Area:     9.22 cm LA Vol (A2C): 54.6 ml 26.60 ml/m RA Volume:   18.10 ml 8.82 ml/m LA Vol (A4C): 44.5 ml 21.68 ml/m  AORTIC VALVE AV Area (Vmax): 1.23 cm AV Vmax:        195.46 cm/s AV Peak Grad:   15.3 mmHg AV Mean Grad:   7.7 mmHg LVOT Vmax:      85.00 cm/s LVOT Vmean:     56.300 cm/s LVOT VTI:       0.180 m  AORTA Ao Root diam: 3.00 cm Ao Asc diam:  3.40 cm MITRAL VALVE                TRICUSPID VALVE MV Area (PHT): 3.99 cm     TR Peak grad:   21.2 mmHg MV Decel Time: 190 msec     TR Vmax:        230.00 cm/s  MV E velocity:  52.00 cm/s MV A velocity: 106.00 cm/s  SHUNTS MV E/A ratio:  0.49         Systemic VTI:  0.18 m                             Systemic Diam: 1.90 cm Rachelle Hora Croitoru MD Electronically signed by Thurmon Fair MD Signature Date/Time: 06/30/2019/9:21:52 AM    Final         Discharge Exam: Vitals:   07/10/19 0542 07/10/19 0914  BP: (!) 135/50 (!) 122/58  Pulse: 66   Resp: 16   Temp: 98.4 F (36.9 C)   SpO2: 98%    Vitals:   07/09/19 2046 07/10/19 0500 07/10/19 0542 07/10/19 0914  BP: (!) 97/37  (!) 135/50 (!) 122/58  Pulse: 71  66   Resp: 17  16   Temp: 98.4 F (36.9 C)  98.4 F (36.9 C)   TempSrc: Oral  Oral   SpO2: 97%  98%   Weight:  101.3 kg    Height:        General: Pt is alert, awake, not in acute distress Cardiovascular: RRR, S1/S2 +, no rubs, no gallops Respiratory: fine bibasilar crackles. No wheeze Abdominal: Soft, NT, ND, bowel sounds + Extremities: no edema, no cyanosis   The results of significant diagnostics from this hospitalization (including imaging, microbiology, ancillary and laboratory) are listed below for reference.    Significant Diagnostic Studies: CARDIAC CATHETERIZATION  Result Date: 06/29/2019  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  99% stenosis/thrombotic with TIMI I flow reduced to 0% with TIMI grade III flow following stent implantation using a 32 x 2.5 mm diameter onyx postdilated to 3.0 mm throughout the proximal two thirds of the stent.  The LAD is otherwise widely patent.  Less than 20% proximal left main  Small first obtuse marginal with proximal 70% narrowing.  Mid to distal circumflex with 60% narrowing.  Right coronary arises somewhat anterior.  There is 40 to 50% proximal/ostial narrowing.  There is 70% hazy eccentric mid RCA stenosis.  LVEDP 40, consistent with acute systolic heart failure.  Prior status of the left ventricle was not known. RECOMMENDATIONS:  Management of acute presumed  systolic heart failure: BiPAP; IV Lasix; Foley catheter; IV nitroglycerin 10-100 mics to keep systolic blood pressure 110 to 120 mmHg.  High intensity statin therapy  Once volume overload has resolved, beta-blocker and angiotensin receptor/ACE/Arni therapy should be considered  2D Doppler echocardiogram to assess LV function in a.m.  Medical therapy of right coronary and circumflex disease.  Aggressive risk factor modification.  DG Chest Port 1 View  Result Date: 07/08/2019 CLINICAL DATA:  Shortness of breath. Recent myocardial infarction. EXAM: PORTABLE CHEST 1 VIEW COMPARISON:  Radiograph 06/29/2019 FINDINGS: Overlying artifact from life vest/monitoring device. Low lung volumes. Mild cardiomegaly. Prior perihilar opacities have near completely resolved likely pulmonary edema. No pleural fluid, pneumothorax, or confluent airspace disease. IMPRESSION: Mild cardiomegaly. Resolved pulmonary edema from exam earlier this month. Electronically Signed   By: Narda Rutherford M.D.   On: 07/08/2019 02:42   DG Chest Port 1 View  Result Date: 06/29/2019 CLINICAL DATA:  Chest pain EXAM: PORTABLE CHEST 1 VIEW COMPARISON:  05/27/2018 FINDINGS: Hazy opacities throughout the left lung. Slight parahilar opacity in the right lung. Cardiomediastinal contours are normal. No pleural effusion or pneumothorax. IMPRESSION: Left-greater-than-right pulmonary opacities favored to indicate pulmonary edema. Electronically Signed  By: Deatra Robinson M.D.   On: 06/29/2019 22:15   ECHOCARDIOGRAM COMPLETE  Result Date: 06/30/2019    ECHOCARDIOGRAM REPORT   Patient Name:   Terry Hanson  Date of Exam: 06/30/2019 Medical Rec #:  [id number]  Height:       65.0 in Accession #:    [id number] Weight:       218.3 lb Date of Birth:  Dec 31, 1940   BSA:          2.053 m Patient Age:    78 years   BP:           117/64 mmHg Patient Gender: F          HR:           92 bpm. Exam Location:  Inpatient Procedure: 2D Echo, Cardiac Doppler and Color Doppler  Indications:    I50.23 Acute on chronic systolic (congestive) heart failure  History:        Patient has no prior history of Echocardiogram examinations.                 Cardiomegaly; Risk Factors:Hypertension and Diabetes.  Sonographer:    Elmarie Shiley Dance Referring Phys: [phone] Barry Dienes [facility]  Sonographer Comments: Suboptimal subcostal window. IMPRESSIONS  1. Left ventricular ejection fraction, by estimation, is 30 to 35%. The left ventricle has moderately decreased function. The left ventricle demonstrates regional wall motion abnormalities (see scoring diagram/findings for description). There is moderate left ventricular hypertrophy. Left ventricular diastolic parameters are consistent with Grade I diastolic dysfunction (impaired relaxation). There is mild dyskinesis of the left ventricular, apex. There is severe hypokinesis of the left ventricular, mid-apical anterior wall, anteroseptal wall, inferior wall and inferoseptal wall. No intracavitary thrombus is seen, but images are suboptimal.  2. Right ventricular systolic function is normal. The right ventricular size is normal. There is mildly elevated pulmonary artery systolic pressure. The estimated right ventricular systolic pressure is 36.2 mmHg.  3. Left atrial size was mildly dilated.  4. The mitral valve is normal in structure. No evidence of mitral valve regurgitation.  5. Aortic valve gradients are underestimated due to poor left ventricular systolic function. The aortic valve is tricuspid. Aortic valve regurgitation is not visualized. Mild aortic valve stenosis.  6. The inferior vena cava is dilated in size with <50% respiratory variability, suggesting right atrial pressure of 15 mmHg. FINDINGS  Left Ventricle: Left ventricular ejection fraction, by estimation, is 30 to 35%. The left ventricle has moderately decreased function. The left ventricle demonstrates regional wall motion abnormalities. Mild dyskinesis of the left ventricular, entire apical segment.  Severe hypokinesis of the left ventricular, mid-apical anterior wall, anteroseptal wall, inferior wall and inferoseptal wall. The left ventricular internal cavity size was normal in size. There is moderate left ventricular hypertrophy. Left ventricular diastolic parameters are consistent with Grade I diastolic dysfunction (impaired relaxation). Normal left ventricular filling pressure. Right Ventricle: The right ventricular size is normal. No increase in right ventricular wall thickness. Right ventricular systolic function is normal. There is mildly elevated pulmonary artery systolic pressure. The tricuspid regurgitant velocity is 2.30  m/s, and with an assumed right atrial pressure of 15 mmHg, the estimated right ventricular systolic pressure is 36.2 mmHg. Left Atrium: Left atrial size was mildly dilated. Right Atrium: Right atrial size was normal in size. Pericardium: There is no evidence of pericardial effusion. Mitral Valve: The mitral valve is normal in structure. Mild mitral annular calcification. No evidence of mitral valve regurgitation. Tricuspid Valve: The tricuspid valve  is normal in structure. Tricuspid valve regurgitation is trivial. Aortic Valve: Aortic valve gradients are underestimated due to poor left ventricular systolic function. The aortic valve is tricuspid. Aortic valve regurgitation is not visualized. Mild aortic stenosis is present. Aortic valve mean gradient measures 7.7 mmHg. Aortic valve peak gradient measures 15.3 mmHg. Dimensionless index is 0.43. Pulmonic Valve: The pulmonic valve was not well visualized. Pulmonic valve regurgitation is not visualized. Aorta: The aortic root and ascending aorta are structurally normal, with no evidence of dilitation. Venous: The inferior vena cava is dilated in size with less than 50% respiratory variability, suggesting right atrial pressure of 15 mmHg. IAS/Shunts: No atrial level shunt detected by color flow Doppler.  LEFT VENTRICLE PLAX 2D LVIDd:          2.70 cm  Diastology LVIDs:         2.10 cm  LV e' lateral:   5.00 cm/s LV PW:         1.70 cm  LV E/e' lateral: 10.4 LV IVS:        1.40 cm  LV e' medial:    3.81 cm/s LVOT diam:     1.90 cm  LV E/e' medial:  13.6 LV SV:         51 LV SV Index:   25 LVOT Area:     2.84 cm  RIGHT VENTRICLE             IVC RV Basal diam:  2.10 cm     IVC diam: 2.00 cm RV S prime:     12.50 cm/s TAPSE (M-mode): 2.0 cm LEFT ATRIUM           Index       RIGHT ATRIUM          Index LA diam:      3.70 cm 1.80 cm/m  RA Area:     9.22 cm LA Vol (A2C): 54.6 ml 26.60 ml/m RA Volume:   18.10 ml 8.82 ml/m LA Vol (A4C): 44.5 ml 21.68 ml/m  AORTIC VALVE AV Area (Vmax): 1.23 cm AV Vmax:        195.46 cm/s AV Peak Grad:   15.3 mmHg AV Mean Grad:   7.7 mmHg LVOT Vmax:      85.00 cm/s LVOT Vmean:     56.300 cm/s LVOT VTI:       0.180 m  AORTA Ao Root diam: 3.00 cm Ao Asc diam:  3.40 cm MITRAL VALVE                TRICUSPID VALVE MV Area (PHT): 3.99 cm     TR Peak grad:   21.2 mmHg MV Decel Time: 190 msec     TR Vmax:        230.00 cm/s MV E velocity: 52.00 cm/s MV A velocity: 106.00 cm/s  SHUNTS MV E/A ratio:  0.49         Systemic VTI:  0.18 m                             Systemic Diam: 1.90 cm Rachelle Hora Croitoru MD Electronically signed by Thurmon Fair MD Signature Date/Time: 06/30/2019/9:21:52 AM    Final      Microbiology: Recent Results (from the past 240 hour(s))  Culture, Urine     Status: Abnormal (Preliminary result)   Collection Time: 07/08/19  7:37 AM   Specimen: Urine, Random  Result Value Ref Range Status  Specimen Description   Final    URINE, RANDOM Performed at [facility], [address], [place], [place] [address]    Special Requests   Final    NONE Performed at [facility], [address] [place], [place] [address]    Culture (A)  Final    >=100,000 COLONIES/mL GRAM NEGATIVE RODS 20,000 COLONIES/mL GRAM NEGATIVE RODS IDENTIFICATION AND SUSCEPTIBILITIES TO FOLLOW Performed at [facility],  [address] [place], [place] [address]    Report Status PENDING  Incomplete     Labs: Basic Metabolic Panel: Recent Labs  Lab 07/08/19 0204 07/08/19 0204 07/09/19 0527 07/10/19 0501  NA 129*  --  136 132*  K 3.9   < > 3.4* 3.5  CL 96*  --  93* 92*  CO2 23  --  29 27  GLUCOSE 167*  --  124* 137*  BUN 27*  --  37* 48*  CREATININE 1.41*  --  1.62* 1.62*  CALCIUM 8.7*  --  9.1 8.8*  MG  --   --  2.5* 2.4   < > = values in this interval not displayed.   Liver Function Tests: Recent Labs  Lab 07/08/19 0204  AST 41  ALT 57*  ALKPHOS 43  BILITOT 1.6*  PROT 7.5  ALBUMIN 3.4*   No results for input(s): LIPASE, AMYLASE in the last 168 hours. No results for input(s): AMMONIA in the last 168 hours. CBC: Recent Labs  Lab 07/08/19 0204 07/09/19 0527  WBC 14.8* 11.1*  NEUTROABS 11.4*  --   HGB 11.1* 10.9*  HCT 34.0* 34.2*  MCV 95.2 96.1  PLT 342 328   Cardiac Enzymes: No results for input(s): CKTOTAL, CKMB, CKMBINDEX, TROPONINI in the last 168 hours. BNP: Invalid input(s): POCBNP CBG: Recent Labs  Lab 07/09/19 1236 07/09/19 1601 07/09/19 2055 07/10/19 0815 07/10/19 1246  GLUCAP 95 104* 147* 117* 104*    Time coordinating discharge:  36 minutes  Signed:  Catarina Hartshorn, DO [facility] Pager: [phone] 07/10/2019, 1:21 PM

## 2019-07-10 NOTE — Progress Notes (Signed)
Physical Therapy Treatment Patient Details Name: Terry Hanson MRN: [id number] DOB: 02-01-1941 Today's Date: 07/10/2019    History of Present Illness Terry Hanson is a 79 y.o. female with medical history significant of osteoarthritis, asthma, type 2 diabetes, cardiomegaly, hyperlipidemia, CAD who was admitted on 06/29/2019 to [facility] due to acute pulmonary edema and STEMI undergoing cardiac cath and stent placement (see procedure note below) who is coming to the emergency department due to progressively worse dyspnea associated with lower extremities pitting edema, orthopnea fatigue since she was discharged from the hospital on 07/02/2019.    PT Comments    Patient highly motivated to improve her function and return home to get stronger. She is seated EOB at beginning of session and completes seated exercises with good mechanics. She is educated on completing while in the hospital and upon returning home. Patient transfer to standing without assist and uses RW upon standing which improves balance. Patient ambulates increased distance today without c/o fatigue and SOB. Patient returned to room with daughter present. Patient will benefit from continued physical therapy in hospital and recommended venue below to increase strength, balance, endurance for safe ADLs and gait.    Follow Up Recommendations  Home health PT;Supervision - Intermittent     Equipment Recommendations  None recommended by PT    Recommendations for Other Services       Precautions / Restrictions Precautions Precautions: Fall Precaution Comments: life vest, monitor vitals Restrictions Weight Bearing Restrictions: No    Mobility  Bed Mobility               General bed mobility comments: Patient seated EOB upon entrance for session  Transfers Overall transfer level: Needs assistance Equipment used: Rolling walker (2 wheeled) Transfers: Sit to/from [employer] Sit to Stand: Supervision Stand pivot  transfers: Supervision       General transfer comment: patient able to transfer to standing without unsteadiness upon standing  Ambulation/Gait Ambulation/Gait assistance: Min guard Gait Distance (Feet): 120 Feet Assistive device: Rolling walker (2 wheeled) Gait Pattern/deviations: Step-through pattern;Decreased stride length Gait velocity: decreased   General Gait Details: short steps with minimal bil foot clearance, dependence on RW for steadying, no c/o fatigue or SOB today   Stairs             Wheelchair Mobility    Modified Rankin (Stroke Patients Only)       Balance Overall balance assessment: Needs assistance Sitting-balance support: Feet supported;No upper extremity supported Sitting balance-Leahy Scale: Good Sitting balance - Comments: seated EOB   Standing balance support: During functional activity;Bilateral upper extremity supported Standing balance-Leahy Scale: Poor Standing balance comment: dependent on RW for steadying                            Cognition Arousal/Alertness: Awake/alert Behavior During Therapy: WFL for tasks assessed/performed Overall Cognitive Status: Within Functional Limits for tasks assessed                                        Exercises General Exercises - Lower Extremity Ankle Circles/Pumps: AROM;Both;20 reps;Seated Long Arc Quad: AROM;Both;20 reps;Seated Hip Flexion/Marching: AROM;Both;20 reps;Seated Toe Raises: AROM;Both;20 reps;Seated Heel Raises: AROM;Both;20 reps;Seated    General Comments        Pertinent Vitals/Pain Pain Assessment: No/denies pain    Home Living  Prior Function            PT Goals (current goals can now be found in the care plan section) Acute Rehab PT Goals Patient Stated Goal: return home, stay independent PT Goal Formulation: With patient Time For Goal Achievement: 07/16/19 Potential to Achieve Goals: Good Progress  towards PT goals: Progressing toward goals    Frequency    Min 3X/week      PT Plan Current plan remains appropriate    Co-evaluation              AM-PAC PT "6 Clicks" Mobility   Outcome Measure  Help needed turning from your back to your side while in a flat bed without using bedrails?: None Help needed moving from lying on your back to sitting on the side of a flat bed without using bedrails?: A Little Help needed moving to and from a bed to a chair (including a wheelchair)?: A Little Help needed standing up from a chair using your arms (e.g., wheelchair or bedside chair)?: A Little Help needed to walk in hospital room?: A Little Help needed climbing 3-5 steps with a railing? : A Lot 6 Click Score: 18    End of Session Equipment Utilized During Treatment: Gait belt Activity Tolerance: Patient tolerated treatment well;Patient limited by fatigue Patient left: in chair;with call bell/phone within reach Nurse Communication: Mobility status PT Visit Diagnosis: Unsteadiness on feet (R26.81);Other abnormalities of gait and mobility (R26.89)     Time: 4158-3094 PT Time Calculation (min) (ACUTE ONLY): 23 min  Charges:  $Therapeutic Exercise: 8-22 mins $Therapeutic Activity: 8-22 mins                     12:18 PM, 07/10/19 Mearl Latin PT, DPT Physical Therapist at [facility]

## 2019-07-11 LAB — URINE CULTURE: Culture: >=100000 — AB

## 2019-07-14 NOTE — Progress Notes (Signed)
Cardiology Office Note    Date:  07/15/2019   ID:  Terry Hanson, DOB Aug 12, 1940, MRN [id number]  PCP:  [name], MD  Cardiologist:  Dr. Katrinka Blazing  Chief Complaint: Hospital follow up    History of Present Illness:   Terry Hanson is a 79 y.o. female with past medical history of CAD (s/p recent STEMI on 06/29/2019 with DES to proximal-LAD), ischemic cardiomyopathy (EF 30-35%), HTN, HLD, CKD III and Type 2 DMseen for hospital follow up.   Recently admitted 06/29/2019 with anterior STEMI.  S/p DES to proximal LAD.  She was discharged on aspirin and Brilinta.  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  She was diuresed.  She had a LifeVest for ischemic cardiomyopathy.  Discharged on lisinopril and Coreg.  If continues to have dyspnea despite euvolemia plan to switch Brilinta to Plavix.  Here today for follow-up with daughter who is a Engineer, civil (consulting).  Yesterday patient got extra dose of Lasix due to lower extremity edema.  Uses low-sodium diet.  Patient has intermittent shortness of breath.  Has dry cough but no orthopnea or PND.  Denies chest pain.  Complains of rash underneath  breast site.  Denies chest pain, dizziness, melena or blood in her stool or urine.  Feels fatigued and tired.  Low blood pressure today.   Past Medical History:  Diagnosis Date  . Arthritis   . Asthma   . Diabetes mellitus (HCC)   . Diabetes mellitus without complication (HCC)   . Enlarged heart   . Hypercholesterolemia   . Hyperlipidemia   . Ischemic cardiomyopathy    a. EF 30-35% by echo in 06/2019  . STEMI (ST elevation myocardial infarction) (HCC)    a. 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    Past Surgical History:  Procedure Laterality Date  . ABDOMINAL HYSTERECTOMY    . CORONARY/GRAFT ACUTE MI REVASCULARIZATION N/A 06/29/2019   Procedure: Coronary/Graft Acute MI Revascularization;  Surgeon: Lyn Records, MD;  Location: [facility] INVASIVE CV LAB;  Service: Cardiovascular;   Laterality: N/A;  . LEFT HEART CATH AND CORONARY ANGIOGRAPHY N/A 06/29/2019   Procedure: LEFT HEART CATH AND CORONARY ANGIOGRAPHY;  Surgeon: Lyn Records, MD;  Location: [facility] INVASIVE CV LAB;  Service: Cardiovascular;  Laterality: N/A;    Current Medications: Prior to Admission medications   Medication Sig Start Date End Date Taking? Authorizing Provider  aspirin 81 MG chewable tablet Chew 1 tablet (81 mg total) by mouth daily. 07/02/19   Arty Baumgartner, NP  carvedilol (COREG) 3.125 MG tablet Take 1 tablet (3.125 mg total) by mouth 2 (two) times daily with a meal. 07/10/19   Tat, Onalee Hua, MD  cefdinir (OMNICEF) 300 MG capsule Take 1 capsule (300 mg total) by mouth 2 (two) times daily. 07/10/19   Catarina Hartshorn, MD  furosemide (LASIX) 40 MG tablet Take 1 tablet (40 mg total) by mouth daily. 07/11/19   Catarina Hartshorn, MD  lisinopril (ZESTRIL) 2.5 MG tablet Take 1 tablet (2.5 mg total) by mouth daily. 07/11/19   Catarina Hartshorn, MD  nitroGLYCERIN (NITROSTAT) 0.4 MG SL tablet Place 1 tablet (0.4 mg total) under the tongue every 5 (five) minutes as needed. 07/02/19   Arty Baumgartner, NP  potassium chloride SA (KLOR-CON) 20 MEQ tablet Take 1 tablet (20 mEq total) by mouth daily. 07/11/19   Catarina Hartshorn, MD  rosuvastatin (CRESTOR) 40 MG tablet Take 1 tablet (40 mg total) by mouth daily. 07/02/19   Laverda Page  B, NP  ticagrelor (BRILINTA) 90 MG TABS tablet Take 1 tablet (90 mg total) by mouth 2 (two) times daily. 07/02/19   Arty Baumgartner, NP  vitamin B-12 (CYANOCOBALAMIN) 500 MCG tablet Take 1 tablet (500 mcg total) by mouth daily. 07/11/19   Catarina Hartshorn, MD    Allergies:   Codeine, Penicillins, Sulfa antibiotics, Codeine, Penicillins, and Sulfa antibiotics   Social History   Socioeconomic History  . Marital status: Married    Spouse name: Not on file  . Number of children: Not on file  . Years of education: Not on file  . Highest education level: Not on file  Occupational History  . Not on file  Tobacco  Use  . Smoking status: Never Smoker  . Smokeless tobacco: Never Used  Substance and Sexual Activity  . Alcohol use: Not Currently  . Drug use: Never  . Sexual activity: Not on file  Other Topics Concern  . Not on file  Social History Narrative   ** Merged History Encounter **       Social Determinants of Health   Financial Resource Strain:   . Difficulty of Paying Living Expenses:   Food Insecurity:   . Worried About Programme researcher, broadcasting/film/video in the Last Year:   . Barista in the Last Year:   Transportation Needs:   . Freight forwarder (Medical):   Marland Kitchen Lack of Transportation (Non-Medical):   Physical Activity:   . Days of Exercise per Week:   . Minutes of Exercise per Session:   Stress:   . Feeling of Stress :   Social Connections:   . Frequency of Communication with Friends and Family:   . Frequency of Social Gatherings with Friends and Family:   . Attends Religious Services:   . Active Member of Clubs or Organizations:   . Attends Banker Meetings:   Marland Kitchen Marital Status:      Family History:  The patient's family history includes Heart disease in her mother.   ROS:   Please see the history of present illness.    ROS All other systems reviewed and are negative.   PHYSICAL EXAM:   VS:  BP (!) 86/48 (BP Location: Left Arm, Patient Position: Sitting, Cuff Size: Normal)   Pulse 78   Ht 5\' 2"  (1.575 m)   Wt 210 lb 6.4 oz (95.4 kg)   BMI 38.48 kg/m    GEN: Well nourished, well developed, in no acute distress  HEENT: normal  Neck: no JVD, carotid bruits, or masses Cardiac: RRR; no murmurs, rubs, or gallops, trace lower extremity edema Respiratory: Diminished breath sounds  GI: soft, nontender, nondistended, + BS MS: no deformity or atrophy  Skin: warm and dry, erythema underneath breast bilaterally  Neuro:  Alert and Oriented x 3, Strength and sensation are intact Psych: euthymic mood, full affect  Wt Readings from Last 3 Encounters:  07/15/19  210 lb 6.4 oz (95.4 kg)  07/10/19 223 lb 5.2 oz (101.3 kg)  07/02/19 215 lb 6.4 oz (97.7 kg)      Studies/Labs Reviewed:   EKG:  EKG is ordered today.  The ekg ordered today demonstrates normal sinus rhythm at rate of 78 bpm, no acute changes  Recent Labs: 06/29/2019: TSH 2.728 07/08/2019: ALT 57; B Natriuretic Peptide 1,020.0 07/09/2019: Hemoglobin 10.9; Platelets 328 07/10/2019: BUN 48; Creatinine, Ser 1.62; Magnesium 2.4; Potassium 3.5; Sodium 132   Lipid Panel    Component Value Date/Time   CHOL  266 (H) 06/29/2019 2218   TRIG 117 06/29/2019 2218   HDL 51 06/29/2019 2218   CHOLHDL 5.2 06/29/2019 2218   VLDL 23 06/29/2019 2218   LDLCALC 192 (H) 06/29/2019 2218    Additional studies/ records that were reviewed today include:   Echocardiogram: 06/30/19 1. Left ventricular ejection fraction, by estimation, is 30 to 35%. The  left ventricle has moderately decreased function. The left ventricle  demonstrates regional wall motion abnormalities (see scoring  diagram/findings for description). There is  moderate left ventricular hypertrophy. Left ventricular diastolic  parameters are consistent with Grade I diastolic dysfunction (impaired  relaxation). There is mild dyskinesis of the left ventricular, apex. There  is severe hypokinesis of the left  ventricular, mid-apical anterior wall, anteroseptal wall, inferior wall  and inferoseptal wall. No intracavitary thrombus is seen, but images are  suboptimal.  2. Right ventricular systolic function is normal. The right ventricular  size is normal. There is mildly elevated pulmonary artery systolic  pressure. The estimated right ventricular systolic pressure is 36.2 mmHg.  3. Left atrial size was mildly dilated.  4. The mitral valve is normal in structure. No evidence of mitral valve  regurgitation.  5. Aortic valve gradients are underestimated due to poor left ventricular  systolic function. The aortic valve is tricuspid. Aortic  valve  regurgitation is not visualized. Mild aortic valve stenosis.  6. The inferior vena cava is dilated in size with <50% respiratory  variability, suggesting right atrial pressure of 15 mmHg.   Cath: 06/29/19   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  99% stenosis/thrombotic with TIMI I flow reduced to 0% with TIMI grade III flow following stent implantation using a 32 x 2.5 mm diameter onyx postdilated to 3.0 mm throughout the proximal two thirds of the stent. The LAD is otherwise widely patent.  Less than 20% proximal left main  Small first obtuse marginal with proximal 70% narrowing. Mid to distal circumflex with 60% narrowing.  Right coronary arises somewhat anterior. There is 40 to 50% proximal/ostial narrowing. There is 70% hazy eccentric mid RCA stenosis.  LVEDP 40, consistent with acute systolic heart failure. Prior status of the left ventricle was not known.  RECOMMENDATIONS:   Management of acute presumed systolic heart failure: BiPAP; IV Lasix; Foley catheter; IV nitroglycerin 10-100 mics to keep systolic blood pressure 110 to 120 mmHg.  High intensity statin therapy  Once volume overload has resolved, beta-blocker and angiotensin receptor/ACE/Arni therapy should be considered  2D Doppler echocardiogram to assess LV function in a.m.  Medical therapy of right coronary and circumflex disease.  Aggressive risk factor modification.  Diagnostic Dominance: Right  Intervention    _____________     ASSESSMENT & PLAN:    1. CAD s/p anterior STEMI 06/29/19 s/p DES to pLAD -No angina.  She continues to have intermittent dyspnea which seems left-sided of Brilinta.  As commended we will change Brilinta to Plavix with loading dose.  Continue beta-blocker, statin and aspirin.  2. ICM -Patient reports gradual weight loss but required extra Lasix yesterday for lower extremity edema.  Trace edema noted by exam.  No  rales or crackles on exam but diminished breath sound.  Encouraged to deep breath.  Her blood pressure is soft low and she is fatigue and tired. -Given soft blood pressure and CKD will hold lisinopril -Continue Coreg 3.125 mg twice daily -Continue Lasix 40 mg daily and K-Dur 20 mEq daily -On LifeVest  3.  Rash underneath  breast bilaterally -This could be due to irritation from LifeVest versus hygiene -Advised her dry and OTC steroid/antibiotic -She has follow-up with PCP on Friday -If no improvement advised to call [facility] for instruction   4.  CKD stage III -Recheck lab with BNP  5.  Diabetes mellitus -Her Metformin was held in hospital -Will review with PCP on Friday  6.  Hyperlipidemia - 06/29/2019: Cholesterol 266; HDL 51; LDL Cholesterol 192; Triglycerides 117; VLDL 23  -Continue high intensity statin -Repeat lab in 6 to 8 weeks    Medication Adjustments/Labs and Tests Ordered: Current medicines are reviewed at length with the patient today.  Concerns regarding medicines are outlined above.  Medication changes, Labs and Tests ordered today are listed in the Patient Instructions below. Patient Instructions  Medication Instructions:  Your physician has recommended you make the following change in your medication:  1.  STOP the Lisinopril 2.  TAKE the last Brilinta tonight.. Starting tomorrow, 07/16/2019, START Plavix taking 4 tablets (at one time) on day 1, thereafter, you will take only 1 tablet daily   *If you need a refill on your cardiac medications before your next appointment, please call your pharmacy*   Lab Work: TODAY:  BMET & PRO BNP  If you have labs (blood work) drawn today and your tests are completely normal, you will receive your results only by: Marland Kitchen MyChart Message (if you have MyChart) OR . A paper copy in the mail If you have any lab test that is abnormal or we need to change your treatment, we will call you to review the  results.   Testing/Procedures: None ordered   Follow-Up: At [facility], you and your health needs are our priority.  As part of our continuing mission to provide you with exceptional heart care, we have created designated Provider Care Teams.  These Care Teams include your primary Cardiologist (physician) and Advanced Practice Providers (APPs -  Physician Assistants and Nurse Practitioners) who all work together to provide you with the care you need, when you need it.  We recommend signing up for the patient portal called "MyChart".  Sign up information is provided on this After Visit Summary.  MyChart is used to connect with patients for Virtual Visits (Telemedicine).  Patients are able to view lab/test results, encounter notes, upcoming appointments, etc.  Non-urgent messages can be sent to your provider as well.   To learn more about what you can do with MyChart, go to [email or web address].    Your next appointment:   2 week(s)  The format for your next appointment:   In Person  Provider:   Robbie Lis, PA-C   Other Instructions       Signed, Leanor Kail, [place]  07/15/2019 9:50 AM    [address], [place], [place]  [address] Phone: [phone]; Fax: [phone]

## 2019-07-15 ENCOUNTER — Other Ambulatory Visit: Payer: Self-pay

## 2019-07-15 ENCOUNTER — Encounter: Payer: Self-pay | Admitting: Physician Assistant

## 2019-07-15 ENCOUNTER — Ambulatory Visit: Payer: Medicare Other | Admitting: Physician Assistant

## 2019-07-15 VITALS — BP 86/48 | HR 78 | Ht 62.0 in | Wt 210.4 lb

## 2019-07-15 DIAGNOSIS — I2101 ST elevation (STEMI) myocardial infarction involving left main coronary artery: Secondary | ICD-10-CM

## 2019-07-15 DIAGNOSIS — I2510 Atherosclerotic heart disease of native coronary artery without angina pectoris: Secondary | ICD-10-CM | POA: Diagnosis not present

## 2019-07-15 DIAGNOSIS — I5043 Acute on chronic combined systolic (congestive) and diastolic (congestive) heart failure: Secondary | ICD-10-CM

## 2019-07-15 DIAGNOSIS — N183 Chronic kidney disease, stage 3 unspecified: Secondary | ICD-10-CM

## 2019-07-15 DIAGNOSIS — E782 Mixed hyperlipidemia: Secondary | ICD-10-CM

## 2019-07-15 DIAGNOSIS — I255 Ischemic cardiomyopathy: Secondary | ICD-10-CM | POA: Diagnosis not present

## 2019-07-15 MED ORDER — CLOPIDOGREL BISULFATE 75 MG PO TABS: ORAL_TABLET | ORAL | 3 refills | Status: DC

## 2019-07-15 NOTE — Patient Instructions (Addendum)
Medication Instructions:  Your physician has recommended you make the following change in your medication:  1.  STOP the Lisinopril 2.  TAKE the last Brilinta tonight.. Starting tomorrow, 07/16/2019, START Plavix taking 4 tablets (at one time) on day 1, thereafter, you will take only 1 tablet daily   *If you need a refill on your cardiac medications before your next appointment, please call your pharmacy*   Lab Work: TODAY:  BMET & PRO BNP  If you have labs (blood work) drawn today and your tests are completely normal, you will receive your results only by: Marland Kitchen MyChart Message (if you have MyChart) OR . A paper copy in the mail If you have any lab test that is abnormal or we need to change your treatment, we will call you to review the results.   Testing/Procedures: None ordered   Follow-Up: At [facility], you and your health needs are our priority.  As part of our continuing mission to provide you with exceptional heart care, we have created designated Provider Care Teams.  These Care Teams include your primary Cardiologist (physician) and Advanced Practice Providers (APPs -  Physician Assistants and Nurse Practitioners) who all work together to provide you with the care you need, when you need it.  We recommend signing up for the patient portal called "MyChart".  Sign up information is provided on this After Visit Summary.  MyChart is used to connect with patients for Virtual Visits (Telemedicine).  Patients are able to view lab/test results, encounter notes, upcoming appointments, etc.  Non-urgent messages can be sent to your provider as well.   To learn more about what you can do with MyChart, go to [email or web address].    Your next appointment:   2 week(s)  The format for your next appointment:   In Person  Provider:   Chelsea Aus, PA-C   Other Instructions

## 2019-07-16 LAB — BASIC METABOLIC PANEL
BUN/Creatinine Ratio: 15 (ref 12–28)
BUN: 23 mg/dL (ref 8–27)
CO2: 23 mmol/L (ref 20–29)
Calcium: 9.9 mg/dL (ref 8.7–10.3)
Chloride: 93 mmol/L — ABNORMAL LOW (ref 96–106)
Creatinine, Ser: 1.57 mg/dL — ABNORMAL HIGH (ref 0.57–1.00)
GFR calc Af Amer: 36 mL/min/{1.73_m2} — ABNORMAL LOW (ref >59)
GFR calc non Af Amer: 32 mL/min/{1.73_m2} — ABNORMAL LOW (ref >59)
Glucose: 137 mg/dL — ABNORMAL HIGH (ref 65–99)
Potassium: 5.4 mmol/L — ABNORMAL HIGH (ref 3.5–5.2)
Sodium: 133 mmol/L — ABNORMAL LOW (ref 134–144)

## 2019-07-16 LAB — PRO B NATRIURETIC PEPTIDE: NT-Pro BNP: 11777 pg/mL — ABNORMAL HIGH (ref 0–738)

## 2019-07-21 ENCOUNTER — Telehealth: Payer: Self-pay | Admitting: Cardiovascular Disease

## 2019-07-21 ENCOUNTER — Telehealth: Payer: Self-pay

## 2019-07-21 ENCOUNTER — Encounter: Payer: Self-pay | Admitting: Interventional Cardiology

## 2019-07-21 ENCOUNTER — Telehealth: Payer: Self-pay | Admitting: Cardiology

## 2019-07-21 DIAGNOSIS — I509 Heart failure, unspecified: Secondary | ICD-10-CM

## 2019-07-21 DIAGNOSIS — R0602 Shortness of breath: Secondary | ICD-10-CM

## 2019-07-21 DIAGNOSIS — I255 Ischemic cardiomyopathy: Secondary | ICD-10-CM

## 2019-07-21 MED ORDER — FUROSEMIDE 40 MG PO TABS: 60.0000 mg | ORAL_TABLET | Freq: Every day | ORAL | 1 refills | Status: DC

## 2019-07-21 NOTE — Telephone Encounter (Signed)
Follow Up  Patient's daughter is calling back in because life vest is not working again. States that the vest popped and gel was everywhere. Please give patient/patient's daughter a call back to assist.

## 2019-07-21 NOTE — Telephone Encounter (Signed)
Patient's daughter states patient's life vest is not working. Please call.

## 2019-07-21 NOTE — Telephone Encounter (Signed)
-----   Message from Loa Socks, LPN sent at 09/21/2829 10:49 AM EDT -----  ----- Message ----- From: Lyn Records, MD Sent: 07/18/2019   3:54 PM EDT To: [name], RN  Let the patient know if short of breath, increase furosemide to 60 mg daily.[place] work as previously ordered. A copy will be sent to [name], MD

## 2019-07-21 NOTE — Telephone Encounter (Signed)
Patient states that she is still sob. Pt is aware to increase Lasix to 60 mg daily. Pt verbalized understanding and thanked me for the call.

## 2019-07-21 NOTE — Telephone Encounter (Signed)
[employer] tech support is calling the patient and will have a rep visit her at her home.

## 2019-07-21 NOTE — Telephone Encounter (Signed)
Received call from daughter Bjorn Loser [employer]) at 8:05 pm.  Patient has had problems today with the fit of her life vest. Apparently a nurse from [employer] came to the house to problem-solve the issue. She left 45 minutes ago. The device has been making audible beeps and the patient at one point felt tingling in her back.  I advised the daughter that she should bring her mother to the nearest ED for further evaluation (to fugure out device malfunction versus true arrhythmic events). She indicated that she understood my instructions.  Donnald Garre, MD Overnight cardiology coverage.

## 2019-07-22 MED ORDER — AMIODARONE HCL 200 MG PO TABS: 200.0000 mg | ORAL_TABLET | Freq: Two times a day (BID) | ORAL | 3 refills | Status: DC

## 2019-07-22 MED ORDER — APIXABAN 5 MG PO TABS: 5.0000 mg | ORAL_TABLET | Freq: Two times a day (BID) | ORAL | 3 refills | Status: DC

## 2019-07-22 NOTE — Addendum Note (Signed)
Addended by: Dennis Bast F on: 07/22/2019 10:15 AM   Modules accepted: Orders

## 2019-07-22 NOTE — Telephone Encounter (Signed)
Agree with the annotations/note by Galvin Proffer, RN.

## 2019-07-22 NOTE — Telephone Encounter (Signed)
Spoke to Terry Hanson and she is aware of Dr Michaelle Copas recommendations.  She will go pick up the medications today and the settings will be changed on her LifeVest tonight.  I will provide her with samples and a 30 day free card.

## 2019-07-22 NOTE — Telephone Encounter (Addendum)
Discussed below with Dr. Katrinka Blazing, he reviewed her [employer] strips and has made the following recommendations:  1- Start Amiodarone 200 mg bid 2- Start Eliquis 5 mg bid 3- Stop Aspirin 81 mg 4- Keep scheduled follw up appointment 5/12 with APP at 11:15am 5- Will need labs-- Creatine and hemoglobin at 5/12 appointment(in appt..notes, orders in) 6- Increase the VT threshold on LifeVest to 170  I have spoken with the patient and let her know Dr. Katrinka Blazing has made recommendations to change some of her medications due to her HR being high and that I will contact her daughter to discuss these changes.  [employer] made 2 visits to her house yesterday and the alarms were better last night. The medication changes as well as setting changes to her LifeVest should also help.  Will call tech support to notify of increase threshold changes.    Left message for daughter to call me to discuss above.

## 2019-07-24 ENCOUNTER — Telehealth: Payer: Self-pay | Admitting: Physician Assistant

## 2019-07-24 MED ORDER — AMIODARONE HCL 200 MG PO TABS: 200.0000 mg | ORAL_TABLET | Freq: Every day | ORAL | 3 refills | Status: DC

## 2019-07-24 NOTE — Telephone Encounter (Signed)
Pt c/o medication issue:  1. Name of Medication: Amiodarone - just started on -07-22-19  And started Eliquis on 07-23-19  2. How are you currently taking this medication (dosage and times per day)?  Both she takes 2 times a day  3. Are you having a reaction (difficulty breathing--STAT)? Yes  4. What is your medication issue? Vomiting , upset stomach

## 2019-07-24 NOTE — Telephone Encounter (Signed)
Spoke to pt and dtr. Pt reports extreme nausea, along w/ some vomiting, since starting the Amiodarone BID.  She has taken 4 doses for far. Reviewed w/ DOD, Dr. Ladona Ridgel.  Order to decrease to 200 mg daily. Pt/dtr advised  ok to decrease Amiodarone 200 mg to once daily.  Advised to take with food to see if this also helps. Advised to call office back if this did not improve symptoms. Patient & dtr verbalized understanding and agreeable to plan.

## 2019-07-27 ENCOUNTER — Ambulatory Visit (INDEPENDENT_AMBULATORY_CARE_PROVIDER_SITE_OTHER): Payer: Medicare Other | Admitting: Physician Assistant

## 2019-07-27 ENCOUNTER — Encounter: Payer: Self-pay | Admitting: Physician Assistant

## 2019-07-27 ENCOUNTER — Inpatient Hospital Stay (HOSPITAL_COMMUNITY)
Admission: AD | Admit: 2019-07-27 | Discharge: 2019-07-30 | DRG: 641 | Disposition: A | Discharge status: Home Health | Payer: Medicare Other | Source: Ambulatory Visit | Attending: Internal Medicine | Admitting: Internal Medicine

## 2019-07-27 ENCOUNTER — Other Ambulatory Visit: Payer: Self-pay

## 2019-07-27 VITALS — BP 100/68 | HR 98 | Ht 62.0 in | Wt 199.0 lb

## 2019-07-27 DIAGNOSIS — I252 Old myocardial infarction: Secondary | ICD-10-CM

## 2019-07-27 DIAGNOSIS — I2510 Atherosclerotic heart disease of native coronary artery without angina pectoris: Secondary | ICD-10-CM

## 2019-07-27 DIAGNOSIS — E1122 Type 2 diabetes mellitus with diabetic chronic kidney disease: Secondary | ICD-10-CM | POA: Diagnosis present

## 2019-07-27 DIAGNOSIS — R112 Nausea with vomiting, unspecified: Secondary | ICD-10-CM

## 2019-07-27 DIAGNOSIS — Z20822 Contact with and (suspected) exposure to covid-19: Secondary | ICD-10-CM | POA: Diagnosis present

## 2019-07-27 DIAGNOSIS — I4891 Unspecified atrial fibrillation: Secondary | ICD-10-CM

## 2019-07-27 DIAGNOSIS — Z7984 Long term (current) use of oral hypoglycemic drugs: Secondary | ICD-10-CM

## 2019-07-27 DIAGNOSIS — Z7902 Long term (current) use of antithrombotics/antiplatelets: Secondary | ICD-10-CM

## 2019-07-27 DIAGNOSIS — Z7901 Long term (current) use of anticoagulants: Secondary | ICD-10-CM

## 2019-07-27 DIAGNOSIS — I472 Ventricular tachycardia, unspecified: Secondary | ICD-10-CM

## 2019-07-27 DIAGNOSIS — N1831 Chronic kidney disease, stage 3a: Secondary | ICD-10-CM

## 2019-07-27 DIAGNOSIS — I5022 Chronic systolic (congestive) heart failure: Secondary | ICD-10-CM | POA: Diagnosis present

## 2019-07-27 DIAGNOSIS — Z79899 Other long term (current) drug therapy: Secondary | ICD-10-CM

## 2019-07-27 DIAGNOSIS — E785 Hyperlipidemia, unspecified: Secondary | ICD-10-CM | POA: Diagnosis present

## 2019-07-27 DIAGNOSIS — I5042 Chronic combined systolic (congestive) and diastolic (congestive) heart failure: Secondary | ICD-10-CM | POA: Diagnosis present

## 2019-07-27 DIAGNOSIS — N184 Chronic kidney disease, stage 4 (severe): Secondary | ICD-10-CM

## 2019-07-27 DIAGNOSIS — I4819 Other persistent atrial fibrillation: Secondary | ICD-10-CM | POA: Diagnosis present

## 2019-07-27 DIAGNOSIS — N183 Chronic kidney disease, stage 3 unspecified: Secondary | ICD-10-CM

## 2019-07-27 DIAGNOSIS — Z880 Allergy status to penicillin: Secondary | ICD-10-CM

## 2019-07-27 DIAGNOSIS — T462X5A Adverse effect of other antidysrhythmic drugs, initial encounter: Secondary | ICD-10-CM | POA: Diagnosis present

## 2019-07-27 DIAGNOSIS — E876 Hypokalemia: Secondary | ICD-10-CM | POA: Diagnosis present

## 2019-07-27 DIAGNOSIS — Z8249 Family history of ischemic heart disease and other diseases of the circulatory system: Secondary | ICD-10-CM

## 2019-07-27 DIAGNOSIS — I255 Ischemic cardiomyopathy: Secondary | ICD-10-CM | POA: Diagnosis present

## 2019-07-27 DIAGNOSIS — N179 Acute kidney failure, unspecified: Secondary | ICD-10-CM | POA: Diagnosis present

## 2019-07-27 DIAGNOSIS — I4729 Other ventricular tachycardia: Secondary | ICD-10-CM

## 2019-07-27 DIAGNOSIS — R54 Age-related physical debility: Secondary | ICD-10-CM | POA: Diagnosis present

## 2019-07-27 DIAGNOSIS — E860 Dehydration: Principal | ICD-10-CM | POA: Diagnosis present

## 2019-07-27 DIAGNOSIS — Z882 Allergy status to sulfonamides status: Secondary | ICD-10-CM

## 2019-07-27 DIAGNOSIS — I5023 Acute on chronic systolic (congestive) heart failure: Secondary | ICD-10-CM | POA: Diagnosis present

## 2019-07-27 DIAGNOSIS — Z885 Allergy status to narcotic agent status: Secondary | ICD-10-CM

## 2019-07-27 DIAGNOSIS — Z955 Presence of coronary angioplasty implant and graft: Secondary | ICD-10-CM

## 2019-07-27 DIAGNOSIS — Z9109 Other allergy status, other than to drugs and biological substances: Secondary | ICD-10-CM

## 2019-07-27 LAB — PROTIME-INR
INR: 2.2 — ABNORMAL HIGH (ref 0.8–1.2)
Prothrombin Time: 23.4 seconds — ABNORMAL HIGH (ref 11.4–15.2)

## 2019-07-27 LAB — COMPREHENSIVE METABOLIC PANEL
ALT: 35 U/L (ref 0–44)
AST: 31 U/L (ref 15–41)
Albumin: 3.3 g/dL — ABNORMAL LOW (ref 3.5–5.0)
Alkaline Phosphatase: 40 U/L (ref 38–126)
Anion gap: 14 (ref 5–15)
BUN: 35 mg/dL — ABNORMAL HIGH (ref 8–23)
CO2: 25 mmol/L (ref 22–32)
Calcium: 9.5 mg/dL (ref 8.9–10.3)
Chloride: 97 mmol/L — ABNORMAL LOW (ref 98–111)
Creatinine, Ser: 2.47 mg/dL — ABNORMAL HIGH (ref 0.44–1.00)
GFR calc Af Amer: 21 mL/min — ABNORMAL LOW (ref >60)
GFR calc non Af Amer: 18 mL/min — ABNORMAL LOW (ref >60)
Glucose, Bld: 147 mg/dL — ABNORMAL HIGH (ref 70–99)
Potassium: 3.5 mmol/L (ref 3.5–5.1)
Sodium: 136 mmol/L (ref 135–145)
Total Bilirubin: 1 mg/dL (ref 0.3–1.2)
Total Protein: 7.6 g/dL (ref 6.5–8.1)

## 2019-07-27 LAB — GLUCOSE, CAPILLARY
Glucose-Capillary: 140 mg/dL — ABNORMAL HIGH (ref 70–99)
Glucose-Capillary: 146 mg/dL — ABNORMAL HIGH (ref 70–99)

## 2019-07-27 LAB — CBC WITH DIFFERENTIAL/PLATELET
Abs Immature Granulocytes: 0.06 10*3/uL (ref 0.00–0.07)
Basophils Absolute: 0.1 10*3/uL (ref 0.0–0.1)
Basophils Relative: 0 %
Eosinophils Absolute: 0 10*3/uL (ref 0.0–0.5)
Eosinophils Relative: 0 %
HCT: 35.2 % — ABNORMAL LOW (ref 36.0–46.0)
Hemoglobin: 11.4 g/dL — ABNORMAL LOW (ref 12.0–15.0)
Immature Granulocytes: 1 %
Lymphocytes Relative: 15 %
Lymphs Abs: 1.8 10*3/uL (ref 0.7–4.0)
MCH: 29.9 pg (ref 26.0–34.0)
MCHC: 32.4 g/dL (ref 30.0–36.0)
MCV: 92.4 fL (ref 80.0–100.0)
Monocytes Absolute: 0.9 10*3/uL (ref 0.1–1.0)
Monocytes Relative: 8 %
Neutro Abs: 8.8 10*3/uL — ABNORMAL HIGH (ref 1.7–7.7)
Neutrophils Relative %: 76 %
Platelets: 355 10*3/uL (ref 150–400)
RBC: 3.81 MIL/uL — ABNORMAL LOW (ref 3.87–5.11)
RDW: 13.5 % (ref 11.5–15.5)
WBC: 11.6 10*3/uL — ABNORMAL HIGH (ref 4.0–10.5)
nRBC: 0 % (ref 0.0–0.2)

## 2019-07-27 LAB — TSH: TSH: 5.43 u[IU]/mL — ABNORMAL HIGH (ref 0.350–4.500)

## 2019-07-27 LAB — HEPARIN LEVEL (UNFRACTIONATED): Heparin Unfractionated: >2.2 IU/mL — ABNORMAL HIGH (ref 0.30–0.70)

## 2019-07-27 LAB — APTT: aPTT: 55 seconds — ABNORMAL HIGH (ref 24–36)

## 2019-07-27 LAB — SARS CORONAVIRUS 2 BY RT PCR (HOSPITAL ORDER, PERFORMED IN ~~LOC~~ HOSPITAL LAB): SARS Coronavirus 2: NEGATIVE

## 2019-07-27 MED ORDER — VITAMIN B-12 100 MCG PO TABS
500.0000 ug | ORAL_TABLET | Freq: Every day | ORAL | Status: DC
  Administered 2019-07-28 – 2019-07-30 (×3): 500 ug via ORAL
  Filled 2019-07-27 (×3): qty 5

## 2019-07-27 MED ORDER — FUROSEMIDE 40 MG PO TABS: 60.0000 mg | ORAL_TABLET | Freq: Every day | ORAL | Status: DC

## 2019-07-27 MED ORDER — ACETAMINOPHEN 325 MG PO TABS
650.0000 mg | ORAL_TABLET | ORAL | Status: DC | PRN
  Filled 2019-07-27: qty 2

## 2019-07-27 MED ORDER — ROSUVASTATIN CALCIUM 20 MG PO TABS
40.0000 mg | ORAL_TABLET | Freq: Every day | ORAL | Status: DC
  Administered 2019-07-28 – 2019-07-30 (×3): 40 mg via ORAL
  Filled 2019-07-27 (×3): qty 2

## 2019-07-27 MED ORDER — SODIUM CHLORIDE 0.9 % IV SOLN: INTRAVENOUS | Status: DC

## 2019-07-27 MED ORDER — METOPROLOL TARTRATE 25 MG PO TABS
25.0000 mg | ORAL_TABLET | Freq: Three times a day (TID) | ORAL | Status: DC
  Administered 2019-07-27: 18:00:00 25 mg via ORAL
  Filled 2019-07-27: qty 1

## 2019-07-27 MED ORDER — METFORMIN HCL 500 MG PO TABS
500.0000 mg | ORAL_TABLET | Freq: Two times a day (BID) | ORAL | Status: DC
  Administered 2019-07-27: 20:00:00 500 mg via ORAL
  Filled 2019-07-27: qty 1

## 2019-07-27 MED ORDER — HEPARIN (PORCINE) 25000 UT/250ML-% IV SOLN
1000.0000 [IU]/h | INTRAVENOUS | Status: DC
  Administered 2019-07-27: 22:00:00 1000 [IU]/h via INTRAVENOUS
  Filled 2019-07-27: qty 250

## 2019-07-27 MED ORDER — INSULIN ASPART 100 UNIT/ML ~~LOC~~ SOLN
0.0000 [IU] | Freq: Three times a day (TID) | SUBCUTANEOUS | Status: DC
  Administered 2019-07-28: 2 [IU] via SUBCUTANEOUS
  Administered 2019-07-29: 3 [IU] via SUBCUTANEOUS

## 2019-07-27 MED ORDER — CLOPIDOGREL BISULFATE 75 MG PO TABS
75.0000 mg | ORAL_TABLET | Freq: Every day | ORAL | Status: DC
  Administered 2019-07-28 – 2019-07-30 (×3): 75 mg via ORAL
  Filled 2019-07-27 (×3): qty 1

## 2019-07-27 MED ORDER — POTASSIUM CHLORIDE CRYS ER 20 MEQ PO TBCR: 20.0000 meq | EXTENDED_RELEASE_TABLET | Freq: Every day | ORAL | Status: DC

## 2019-07-27 MED ORDER — ONDANSETRON HCL 4 MG/2ML IJ SOLN
4.0000 mg | Freq: Four times a day (QID) | INTRAMUSCULAR | Status: DC | PRN
  Administered 2019-07-27: 19:00:00 4 mg via INTRAVENOUS
  Filled 2019-07-27: qty 2

## 2019-07-27 MED ORDER — POTASSIUM CHLORIDE CRYS ER 20 MEQ PO TBCR
40.0000 meq | EXTENDED_RELEASE_TABLET | Freq: Once | ORAL | Status: AC
  Administered 2019-07-27: 40 meq via ORAL
  Filled 2019-07-27: qty 2

## 2019-07-27 MED ORDER — NITROGLYCERIN 0.4 MG SL SUBL: 0.4000 mg | SUBLINGUAL_TABLET | SUBLINGUAL | Status: DC | PRN

## 2019-07-27 NOTE — Telephone Encounter (Signed)
Spoke with daughter and pt is still having issues with nausea and vomiting.  Was able to eat some solids yesterday and drank some bali water.  Episode of vomiting occurs shortly after taking medication.  Pt's Amiodarone was decreased to QD on Friday to see if any improvement but there wasn't any.  Moved pt's f/u appt up to today.  Daughter appreciative for assistance.

## 2019-07-27 NOTE — Telephone Encounter (Signed)
Patient's daughter calling back stating the patient is still throwing up this morning. She states the patient has only been having water, pudding and jello, because she is throwing up so much. She states she believes it is the eliquis and is requesting a nurse call her back.

## 2019-07-27 NOTE — Progress Notes (Signed)
   07/27/19 1647  Assess: MEWS Score  ECG Heart Rate (!) 127  Assess: MEWS Score  MEWS Temp 0  MEWS Systolic 0  MEWS Pulse 2  MEWS RR 0  MEWS LOC 0  MEWS Score 2  MEWS Score Color Yellow  Assess: if the MEWS score is Yellow or Red  Were vital signs taken at a resting state? Yes (HR documented by CCMD)  Focused Assessment Documented focused assessment  Early Detection of Sepsis Score *See Row Information* Low  MEWS guidelines implemented *See Row Information* Yes  Treat  MEWS Interventions Administered scheduled meds/treatments;Escalated (See documentation below)  Take Vital Signs  Increase Vital Sign Frequency  Yellow: Q 2hr X 2 then Q 4hr X 2, if remains yellow, continue Q 4hrs  Escalate  MEWS: Escalate Yellow: discuss with charge nurse/RN and consider discussing with provider and RRT  Notify: Charge Nurse/RN  Name of Charge Nurse/RN Notified Traveon Louro  Date Charge Nurse/RN Notified 07/27/19  Time Charge Nurse/RN Notified 1647  Document  Patient Outcome Stabilized after interventions  Progress note created (see row info) Yes

## 2019-07-27 NOTE — Progress Notes (Signed)
ANTICOAGULATION CONSULT NOTE - Initial Consult  Pharmacy Consult for heparin  Indication: atrial fibrillation  Allergies  Allergen Reactions  . Codeine Hives  . Penicillins Hives  . Sulfa Antibiotics Itching  . Codeine Itching and Rash  . Penicillins Itching and Rash    Did it involve swelling of the face/tongue/throat, SOB, or low BP? Yes Did it involve sudden or severe rash/hives, skin peeling, or any reaction on the inside of your mouth or nose? Yes Did you need to seek medical attention at a hospital or doctor's office? Yes When did it last happen? Over 10 years If all above answers are "NO", may proceed with cephalosporin use.   . Sulfa Antibiotics Itching and Rash    Patient Measurements: Heparin dosing wt: 71kg  Vital Signs: BP: 100/68 (05/10 1456) Pulse Rate: 98 (05/10 1456)  Labs: No results for input(s): HGB, HCT, PLT, APTT, LABPROT, INR, HEPARINUNFRC, HEPRLOWMOCWT, CREATININE, CKTOTAL, CKMB, TROPONINIHS in the last 72 hours.  Estimated Creatinine Clearance: 30.9 mL/min (A) (by C-G formula based on SCr of 1.57 mg/dL (H)).   Medical History: Past Medical History:  Diagnosis Date  . Arthritis   . Asthma   . Diabetes mellitus (HCC)   . Diabetes mellitus without complication (HCC)   . Enlarged heart   . Hypercholesterolemia   . Hyperlipidemia   . Ischemic cardiomyopathy    a. EF 30-35% by echo in 06/2019  . STEMI (ST elevation myocardial infarction) (HCC)    a. 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     Assessment: 79 yo female on apixaban PTA for afib (last dose on the morning og 5/10).  She is noted with nausea and vomiting and pharmacy consulted to dose heparin  -hg= 11.4  Goal of Therapy:  Heparin level 0.3-0.7 units/ml aPTT 66-102 seconds Monitor platelets by anticoagulation protocol: Yes   Plan:  -Start heparin at 1000 units/hr at 10pm -aPTT in 8 hours and daily with CBC daily -Heparin level daily  Harland German,  PharmD Clinical Pharmacist **Pharmacist phone directory can now be found on [email or web address] ([email or web address]).  Listed under [facility].

## 2019-07-27 NOTE — H&P (Signed)
Cardiology Admission History and Physical:   Patient ID: Terry Hanson MRN: [id number]; DOB: February 23, 1941   Admission date: 07/27/2019  Primary Care Provider: Lucia Gaskins, MD Primary Cardiologist: Sinclair Grooms, MD   Primary Electrophysiologist:  None    Chief Complaint:  Nausea    Patient Profile:    Terry Hanson is a 79 y.o. female with:   Coronary artery disease   S/p anterior STEMI 06/2019 >> PCI: DES to the LAD  Residual: Small OM1 proximal 33, mid-distal LCx 72, pRCA 40-50, mRCA 70 >> med Rx  Chronic systolic CHF  Ischemic cardiomyopathy; EF 30-35  Diabetes mellitus  Hyperlipidemia   Chronic kidney disease   Prior CV studies: Echocardiogram 06/30/2019 EF 30-35, moderate LVH, GR 1 DD, anterior/anteroseptal/inferior and inferoseptal severe HK, PASP 36.2, mild LAE  Cardiac catheterization 06/29/2019 LM ostial 20 LAD ostial 99, proximal 60, mid 60 LCx mid 60; OM1 80 RCA proximal 70 PCI: 2.5 x 34 mm Resolute Onyx DES to the LAD  History of Present Illness:    Terry Hanson was admitted in April 2021 with an anterior STEMI.  She underwent PCI with a DES to the proximal LAD.  EF was 30-35.  Hypotension limited CHF medication titration.  She was discharged on LifeVest.  She was readmitted 1 week later with decompensated heart failure.  She was seen in clinic 07/15/2019 by Leanor Kail, PA-C.  Her Ticagrelor was changed to Clopidogrel due to side effects of dyspnea.  Since that time, she has had alarms from her LifeVest.  Apparently, she was noted to have ventricular tachycardia as well as atrial fibrillation.  She was placed on amiodarone and apixaban.  VT threshold was increased to 170.  Patient was arranged to have follow-up later this week.  However, she called in last week with complaints of nausea and vomiting after starting on amiodarone.  Her dose was reduced.  She was still having symptoms today and she was added onto my schedule for further evaluation.  She is  here today with her daughter.  She is noticeably short of breath walking into the office. She notes significant nausea, vomiting and diarrhea since starting on amiodarone and Apixaban. Prior to starting these medication she was doing well and her strength was increasing while she worked with physical therapy. She has not had significant chest discomfort. She sleeps on an incline chronically. She has not had lower extremity swelling. She has not had syncope.  Past Medical History:  Diagnosis Date  . Arthritis   . Asthma   . Diabetes mellitus ([place])   . Diabetes mellitus without complication ([place])   . Enlarged heart   . Hypercholesterolemia   . Hyperlipidemia   . Ischemic cardiomyopathy    a. EF 30-35% by echo in 06/2019  . STEMI (ST elevation myocardial infarction) ([place])    a. 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    Current Medications: Current Meds  Medication Sig  . amiodarone (PACERONE) 200 MG tablet Take 1 tablet (200 mg total) by mouth daily.  Marland Kitchen apixaban (ELIQUIS) 5 MG TABS tablet Take 1 tablet (5 mg total) by mouth 2 (two) times daily.  . carvedilol (COREG) 3.125 MG tablet Take 1 tablet (3.125 mg total) by mouth 2 (two) times daily with a meal.  . clopidogrel (PLAVIX) 75 MG tablet Take 75 mg by mouth daily.  . furosemide (LASIX) 40 MG tablet Take 1.5 tablets (60 mg total) by mouth daily.  . metFORMIN (GLUCOPHAGE) 500 MG  tablet Take 500 mg by mouth 2 (two) times daily.  . nitroGLYCERIN (NITROSTAT) 0.4 MG SL tablet Place 1 tablet (0.4 mg total) under the tongue every 5 (five) minutes as needed.  . potassium chloride SA (KLOR-CON) 20 MEQ tablet Take 1 tablet (20 mEq total) by mouth daily.  . rosuvastatin (CRESTOR) 40 MG tablet Take 1 tablet (40 mg total) by mouth daily.  . vitamin B-12 (CYANOCOBALAMIN) 500 MCG tablet Take 1 tablet (500 mcg total) by mouth daily.     Allergies:   Codeine, Penicillins, Sulfa antibiotics, Codeine, Penicillins, and Sulfa antibiotics   Social  History   Tobacco Use  . Smoking status: Never Smoker  . Smokeless tobacco: Never Used  Substance Use Topics  . Alcohol use: Not Currently  . Drug use: Never     Family Hx: The patient's family history includes Heart disease in her mother.  Review of Systems  Constitution: Negative for chills and fever.  Respiratory: Negative for cough.   Gastrointestinal: Positive for diarrhea, nausea and vomiting. Negative for hematochezia and melena.  Genitourinary: Negative for hematuria.  All other systems reviewed and are negative.    EKGs/Labs/Other Test Reviewed:    EKG:  EKG is   ordered today.  The ekg ordered today demonstrates atrial fibrillation with RVR, HR 138, rightward axis, low voltage, nonspecific ST-T wave changes, QTC 406  Recent Labs: 06/29/2019: TSH 2.728 07/08/2019: ALT 57; B Natriuretic Peptide 1,020.0 07/09/2019: Hemoglobin 10.9; Platelets 328 07/10/2019: Magnesium 2.4 07/15/2019: BUN 23; Creatinine, Ser 1.57; NT-Pro BNP 11,777; Potassium 5.4; Sodium 133   Recent Lipid Panel Lab Results  Component Value Date/Time   CHOL 266 (H) 06/29/2019 10:18 PM   TRIG 117 06/29/2019 10:18 PM   HDL 51 06/29/2019 10:18 PM   CHOLHDL 5.2 06/29/2019 10:18 PM   LDLCALC 192 (H) 06/29/2019 10:18 PM    Physical Exam:    VS:  BP 100/68   Pulse 98   Ht 5\' 2"  (1.575 m)   Wt 199 lb (90.3 kg)   SpO2 97%   BMI 36.40 kg/m     Wt Readings from Last 3 Encounters:  07/27/19 199 lb (90.3 kg)  07/15/19 210 lb 6.4 oz (95.4 kg)  07/10/19 223 lb 5.2 oz (101.3 kg)     Constitutional:      Appearance: Not in distress. Frail.  HENT:     Head: Normocephalic and atraumatic.  Neck:     Lymphadenopathy: No cervical adenopathy.  Pulmonary:     Effort: Pulmonary effort is normal.     Breath sounds: No wheezing. No rales.  Cardiovascular:     Tachycardia present. Irregularly irregular rhythm. Normal S1. Normal S2.     Murmurs: There is no murmur.  Edema:    Peripheral edema absent.    Abdominal:     Palpations: Abdomen is soft.  Musculoskeletal:     Cervical back: Neck supple. Skin:    General: Skin is warm and dry.  Neurological:     Mental Status: Alert and oriented to person, place and time.     Cranial Nerves: Cranial nerves are intact.  Psychiatric:        Mood and Affect: Affect is flat.       ASSESSMENT & PLAN:    1. Atrial fibrillation with RVR (HCC) As noted, she recently was noted to have episodes of ventricular tachycardia as well as atrial fibrillation. Since starting on amiodarone and Apixaban, she has had significant nausea, vomiting and diarrhea. Today, she is  in atrial fibrillation with rapid ventricular rate. Question if her symptoms are all due to rapid rate or if she is dehydrated with acute kidney injury resulting in rapid rate and her current symptoms. She is not thriving at home and I have suggested admission to the hospital for evaluation and management. I reviewed this with Dr. Tenny Craw (attending MD) who agreed. She also saw the patient. She needs better rate control. Since she has an ischemic cardiomyopathy with EF 30-35, we will avoid IV diltiazem. We will stop her Apixaban and place her on IV heparin in order to determine if her symptoms are related to Apixaban. We will also stop her amiodarone for now.  -Admit to Tele for observation  -DC Amiodarone, DC Apixaban  -DC Carvedilol   -Start Metoprolol Tartrate 25 mg three times a day for rate control  -Start IV Heparin for anticoagulation   -Check CMET, CBC, TSH  2. Chronic systolic CHF (congestive heart failure) (HCC) Ischemic cardiomyopathy.  EF 30-35.  She is on a LifeVest. Low blood pressure has limited medication titration for CHF. Her volume status seems to be well controlled. Question if she may be dehydrated leading to some of her symptoms and rapid rate. Continue current dose of furosemide for now. Obtain comprehensive metabolic panel. If her creatinine is significant elevated, we will need  to hold her furosemide and gently hydrate her.  3. Coronary artery disease involving native coronary artery of native heart without angina pectoris Status post recent anterior ST elevation myocardial infarction. She was treated with a drug-eluting stent to the LAD. She has moderate residual disease elsewhere which is being managed medically. She had side effects to Ticagrelor and was switched to Clopidogrel. Her breathing has improved with this. Continue current dose of rosuvastatin.  Adjust beta-blocker as noted above.   4. Stage 3 chronic kidney disease, unspecified whether stage 3a or 3b CKD Recent creatinine 1.57. As noted, repeat comprehensive metabolic panel will be obtained to assess for AKI.    5. Ventricular tachycardia (HCC) This was recently noted on her LifeVest tracings. She was placed on amiodarone. Given her current symptoms, we will stop her amiodarone. She may need to be evaluated by EP in the hospital if her symptoms are clearly improved off of amiodarone.  6. Nausea and vomiting, intractability of vomiting not specified, unspecified vomiting type Question of this is related to atrial fibrillation with rapid ventricular rate or dehydration versus medications. Obtain comprehensive metabolic panel, CBC and TSH as outlined above.    Severity of Illness: The appropriate patient status for this patient is OBSERVATION. Observation status is judged to be reasonable and necessary in order to provide the required intensity of service to ensure the patient's safety. The patient's presenting symptoms, physical exam findings, and initial radiographic and laboratory data in the context of their medical condition is felt to place them at decreased risk for further clinical deterioration. Furthermore, it is anticipated that the patient will be medically stable for discharge from the hospital within 2 midnights of admission. The following factors support the patient status of observation.   " The  patient's presenting symptoms include nausea, vomiting, shortness of breath. " The physical exam findings include tachycardia. " The initial radiographic and laboratory data are atrial fibrillation with RVR on ECG.    For questions or updates, please contact [facility] Please consult [email or web address] for contact info under   Signed, Tereso Newcomer, PA-C  07/27/2019 4:30 PM

## 2019-07-27 NOTE — Progress Notes (Signed)
   Reviewed admission labs. Lab Results  Component Value Date   CREATININE 2.47 (H) 07/27/2019   BUN 35 (H) 07/27/2019   K 3.5 07/27/2019    Assessment & Plan  1. Acute kidney injury on chronic kidney disease  Likely dehydrated from n/v and diarrhea in setting of chronic diuretic use Hold Metformin Hold Lasix Hold K+ Will give 1 dose of K+ 40 mEq  Will gently hydrate - 0.9% NaCl at 50 cc/hr x 10 hours BMET in AM [place], PA-C    07/27/2019 10:41 PM

## 2019-07-27 NOTE — Progress Notes (Signed)
Cardiology Office Note:    Date:  07/27/2019   ID:  [name], DOB [birth year], MRN [id number]  PCP:  [name], MD  Cardiologist:  Lesleigh Noe, MD   Electrophysiologist:  None   Referring MD: [name], MD   Chief Complaint:  Nausea    Patient Profile:    Terry Hanson is a 79 y.o. female with:   Coronary artery disease   S/p anterior STEMI 06/2019 >> PCI: DES to the LAD  Residual: Small OM1 proximal 70, mid-distal LCx 60, pRCA 40-50, mRCA 70 >> med Rx  Chronic systolic CHF  Ischemic cardiomyopathy; EF 30-35  Diabetes mellitus  Hyperlipidemia   Chronic kidney disease   Prior CV studies: Echocardiogram 06/30/2019 EF 30-35, moderate LVH, GR 1 DD, anterior/anteroseptal/inferior and inferoseptal severe HK, PASP 36.2, mild LAE  Cardiac catheterization 06/29/2019 LM ostial 20 LAD ostial 99, proximal 60, mid 60 LCx mid 60; OM1 80 RCA proximal 70 PCI: 2.5 x 34 mm Resolute Onyx DES to the LAD  History of Present Illness:    Terry Hanson was admitted in April 2021 with an anterior STEMI.  She underwent PCI with a DES to the proximal LAD.  EF was 30-35.  Hypotension limited CHF medication titration.  She was discharged on LifeVest.  She was readmitted 1 week later with decompensated heart failure.  She was seen in clinic 07/15/2019 by Manson Passey, PA-C.  Her Ticagrelor was changed to Clopidogrel due to side effects of dyspnea.  Since that time, she has had alarms from her LifeVest.  Apparently, she was noted to have ventricular tachycardia as well as atrial fibrillation.  She was placed on amiodarone and apixaban.  VT threshold was increased to 170.  Patient was arranged to have follow-up later this week.  However, she called in last week with complaints of nausea and vomiting after starting on amiodarone.  Her dose was reduced.  She was still having symptoms today and she was added onto my schedule for further evaluation.  She is here today with her daughter.   She is noticeably short of breath walking into the office. She notes significant nausea, vomiting and diarrhea since starting on amiodarone and Apixaban. Prior to starting these medication she was doing well and her strength was increasing while she worked with physical therapy. She has not had significant chest discomfort. She sleeps on an incline chronically. She has not had lower extremity swelling. She has not had syncope.  Past Medical History:  Diagnosis Date  . Arthritis   . Asthma   . Diabetes mellitus (HCC)   . Diabetes mellitus without complication (HCC)   . Enlarged heart   . Hypercholesterolemia   . Hyperlipidemia   . Ischemic cardiomyopathy    a. EF 30-35% by echo in 06/2019  . STEMI (ST elevation myocardial infarction) (HCC)    a. 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    Current Medications: Current Meds  Medication Sig  . apixaban (ELIQUIS) 5 MG TABS tablet Take 1 tablet (5 mg total) by mouth 2 (two) times daily.  . clopidogrel (PLAVIX) 75 MG tablet Take 75 mg by mouth daily.  . furosemide (LASIX) 40 MG tablet Take 1.5 tablets (60 mg total) by mouth daily.  . metFORMIN (GLUCOPHAGE) 500 MG tablet Take 500 mg by mouth 2 (two) times daily.  . nitroGLYCERIN (NITROSTAT) 0.4 MG SL tablet Place 1 tablet (0.4 mg total) under the tongue every 5 (five) minutes as needed.  Marland Kitchen  potassium chloride SA (KLOR-CON) 20 MEQ tablet Take 1 tablet (20 mEq total) by mouth daily.  . rosuvastatin (CRESTOR) 40 MG tablet Take 1 tablet (40 mg total) by mouth daily.  . vitamin B-12 (CYANOCOBALAMIN) 500 MCG tablet Take 1 tablet (500 mcg total) by mouth daily.  . [DISCONTINUED] amiodarone (PACERONE) 200 MG tablet Take 1 tablet (200 mg total) by mouth daily.  . [DISCONTINUED] carvedilol (COREG) 3.125 MG tablet Take 1 tablet (3.125 mg total) by mouth 2 (two) times daily with a meal.     Allergies:   Codeine, Penicillins, Sulfa antibiotics, Codeine, Penicillins, and Sulfa antibiotics   Social  History   Tobacco Use  . Smoking status: Never Smoker  . Smokeless tobacco: Never Used  Substance Use Topics  . Alcohol use: Not Currently  . Drug use: Never     Family Hx: The patient's family history includes Heart disease in her mother.  Review of Systems  Constitution: Negative for chills and fever.  Respiratory: Negative for cough.   Gastrointestinal: Positive for diarrhea, nausea and vomiting. Negative for hematochezia and melena.  Genitourinary: Negative for hematuria.  All other systems reviewed and are negative.    EKGs/Labs/Other Test Reviewed:    EKG:  EKG is   ordered today.  The ekg ordered today demonstrates atrial fibrillation with RVR, HR 138, rightward axis, low voltage, nonspecific ST-T wave changes, QTC 406  Recent Labs: 06/29/2019: TSH 2.728 07/08/2019: ALT 57; B Natriuretic Peptide 1,020.0 07/09/2019: Hemoglobin 10.9; Platelets 328 07/10/2019: Magnesium 2.4 07/15/2019: BUN 23; Creatinine, Ser 1.57; NT-Pro BNP 11,777; Potassium 5.4; Sodium 133   Recent Lipid Panel Lab Results  Component Value Date/Time   CHOL 266 (H) 06/29/2019 10:18 PM   TRIG 117 06/29/2019 10:18 PM   HDL 51 06/29/2019 10:18 PM   CHOLHDL 5.2 06/29/2019 10:18 PM   LDLCALC 192 (H) 06/29/2019 10:18 PM    Physical Exam:    VS:  BP 100/68   Pulse 98   Ht 5\' 2"  (1.575 m)   Wt 199 lb (90.3 kg)   SpO2 97%   BMI 36.40 kg/m     Wt Readings from Last 3 Encounters:  07/27/19 199 lb (90.3 kg)  07/15/19 210 lb 6.4 oz (95.4 kg)  07/10/19 223 lb 5.2 oz (101.3 kg)     Constitutional:      Appearance: Not in distress. Frail.  HENT:     Head: Normocephalic and atraumatic.  Neck:     Lymphadenopathy: No cervical adenopathy.  Pulmonary:     Effort: Pulmonary effort is normal.     Breath sounds: No wheezing. No rales.  Cardiovascular:     Tachycardia present. Irregularly irregular rhythm. Normal S1. Normal S2.     Murmurs: There is no murmur.  Edema:    Peripheral edema absent.    Abdominal:     Palpations: Abdomen is soft.  Musculoskeletal:     Cervical back: Neck supple. Skin:    General: Skin is warm and dry.  Neurological:     Mental Status: Alert and oriented to person, place and time.     Cranial Nerves: Cranial nerves are intact.  Psychiatric:        Mood and Affect: Affect is flat.       ASSESSMENT & PLAN:    1. Atrial fibrillation with RVR ([place]) As noted, she recently was noted to have episodes of ventricular tachycardia as well as atrial fibrillation. Since starting on amiodarone and Apixaban, she has had significant nausea, vomiting  and diarrhea. Today, she is in atrial fibrillation with rapid ventricular rate. Question if her symptoms are all due to rapid rate or if she is dehydrated with acute kidney injury resulting in rapid rate and her current symptoms. She is not thriving at home and I have suggested admission to the hospital for evaluation and management. I reviewed this with Dr. Tenny Craw (attending MD) who agreed. She also saw the patient. She needs better rate control. Since she has an ischemic cardiomyopathy with EF 30-35, we will avoid IV diltiazem. We will stop her Apixaban and placed on IV heparin in order to determine if her symptoms are related to Apixaban. We will also stop her amiodarone for now.  -Admit to Tele for observation  -DC Amiodarone, DC Apixaban  -DC Carvedilol   -Start Metoprolol Tartrate 25 mg three times a day for rate control  -Start IV Heparin for anticoagulation   -Check CMET, CBC, TSH  2. Chronic systolic CHF (congestive heart failure) (HCC) Ischemic cardiomyopathy.  EF 30-35.  She is on a LifeVest. Low blood pressure has limited medication titration for CHF. Her volume status seems to be well controlled. Question if she may be dehydrated leading to some of her symptoms and rapid rate. Continue current dose of furosemide for now. Obtain comprehensive metabolic panel. If her creatinine is significant elevated, we will need to  hold her furosemide and gently hydrate her.  3. Coronary artery disease involving native coronary artery of native heart without angina pectoris Status post recent anterior ST elevation myocardial infarction. She was treated with a drug-eluting stent to the LAD. She has moderate residual disease elsewhere which is being managed medically. She had side effects to Ticagrelor and was switched to Clopidogrel. Her breathing has improved with this. Continue current dose of rosuvastatin.  Adjust beta-blocker as noted above.   4. Stage 3 chronic kidney disease, unspecified whether stage 3a or 3b CKD Recent creatinine 1.57. As noted repeat comprehensive metabolic panel will be obtained.  5. Ventricular tachycardia (HCC) This was recently noted on her LifeVest tracings. She was placed on amiodarone. Given her current symptoms, we will stop her amiodarone. She may need to be evaluated by EP in the hospital if her symptoms are clearly improved off of amiodarone.  6. Nausea and vomiting, intractability of vomiting not specified, unspecified vomiting type Question of this is related to atrial fibrillation with rapid ventricular rate or dehydration versus medications. Obtain comprehensive metabolic panel, CBC and TSH as outlined above.    Dispo:  Return for [facility] Follow Up.   Medication Adjustments/Labs and Tests Ordered: Current medicines are reviewed at length with the patient today.  Concerns regarding medicines are outlined above.  Tests Ordered: No orders of the defined types were placed in this encounter.  Medication Changes: No orders of the defined types were placed in this encounter.   Signed, Tereso Newcomer, PA-C  07/27/2019 4:36 PM    [facility] [address] [place], [place], [place]  [address] Phone: [phone]; Fax: [phone]

## 2019-07-28 DIAGNOSIS — I255 Ischemic cardiomyopathy: Secondary | ICD-10-CM

## 2019-07-28 DIAGNOSIS — I2510 Atherosclerotic heart disease of native coronary artery without angina pectoris: Secondary | ICD-10-CM | POA: Diagnosis present

## 2019-07-28 DIAGNOSIS — N183 Chronic kidney disease, stage 3 unspecified: Secondary | ICD-10-CM

## 2019-07-28 DIAGNOSIS — I4891 Unspecified atrial fibrillation: Secondary | ICD-10-CM

## 2019-07-28 DIAGNOSIS — Z7902 Long term (current) use of antithrombotics/antiplatelets: Secondary | ICD-10-CM | POA: Diagnosis not present

## 2019-07-28 DIAGNOSIS — N179 Acute kidney failure, unspecified: Secondary | ICD-10-CM | POA: Diagnosis present

## 2019-07-28 DIAGNOSIS — I252 Old myocardial infarction: Secondary | ICD-10-CM | POA: Diagnosis not present

## 2019-07-28 DIAGNOSIS — Z9109 Other allergy status, other than to drugs and biological substances: Secondary | ICD-10-CM | POA: Diagnosis not present

## 2019-07-28 DIAGNOSIS — Z7901 Long term (current) use of anticoagulants: Secondary | ICD-10-CM | POA: Diagnosis not present

## 2019-07-28 DIAGNOSIS — R110 Nausea: Secondary | ICD-10-CM

## 2019-07-28 DIAGNOSIS — I4819 Other persistent atrial fibrillation: Secondary | ICD-10-CM | POA: Diagnosis present

## 2019-07-28 DIAGNOSIS — I472 Ventricular tachycardia: Secondary | ICD-10-CM

## 2019-07-28 DIAGNOSIS — E876 Hypokalemia: Secondary | ICD-10-CM | POA: Diagnosis present

## 2019-07-28 DIAGNOSIS — Z20822 Contact with and (suspected) exposure to covid-19: Secondary | ICD-10-CM | POA: Diagnosis present

## 2019-07-28 DIAGNOSIS — Z880 Allergy status to penicillin: Secondary | ICD-10-CM | POA: Diagnosis not present

## 2019-07-28 DIAGNOSIS — Z955 Presence of coronary angioplasty implant and graft: Secondary | ICD-10-CM | POA: Diagnosis not present

## 2019-07-28 DIAGNOSIS — Z882 Allergy status to sulfonamides status: Secondary | ICD-10-CM | POA: Diagnosis not present

## 2019-07-28 DIAGNOSIS — Z7984 Long term (current) use of oral hypoglycemic drugs: Secondary | ICD-10-CM | POA: Diagnosis not present

## 2019-07-28 DIAGNOSIS — R54 Age-related physical debility: Secondary | ICD-10-CM | POA: Diagnosis present

## 2019-07-28 DIAGNOSIS — E1122 Type 2 diabetes mellitus with diabetic chronic kidney disease: Secondary | ICD-10-CM | POA: Diagnosis present

## 2019-07-28 DIAGNOSIS — Z885 Allergy status to narcotic agent status: Secondary | ICD-10-CM | POA: Diagnosis not present

## 2019-07-28 DIAGNOSIS — Z79899 Other long term (current) drug therapy: Secondary | ICD-10-CM | POA: Diagnosis not present

## 2019-07-28 DIAGNOSIS — I5022 Chronic systolic (congestive) heart failure: Secondary | ICD-10-CM | POA: Diagnosis present

## 2019-07-28 DIAGNOSIS — E860 Dehydration: Secondary | ICD-10-CM | POA: Diagnosis present

## 2019-07-28 DIAGNOSIS — R112 Nausea with vomiting, unspecified: Secondary | ICD-10-CM | POA: Diagnosis present

## 2019-07-28 DIAGNOSIS — E785 Hyperlipidemia, unspecified: Secondary | ICD-10-CM | POA: Diagnosis present

## 2019-07-28 LAB — BASIC METABOLIC PANEL
Anion gap: 15 (ref 5–15)
Anion gap: 11 (ref 5–15)
BUN: 34 mg/dL — ABNORMAL HIGH (ref 8–23)
BUN: 33 mg/dL — ABNORMAL HIGH (ref 8–23)
CO2: 21 mmol/L — ABNORMAL LOW (ref 22–32)
CO2: 21 mmol/L — ABNORMAL LOW (ref 22–32)
Calcium: 8.7 mg/dL — ABNORMAL LOW (ref 8.9–10.3)
Calcium: 8.6 mg/dL — ABNORMAL LOW (ref 8.9–10.3)
Chloride: 100 mmol/L (ref 98–111)
Chloride: 102 mmol/L (ref 98–111)
Creatinine, Ser: 2.31 mg/dL — ABNORMAL HIGH (ref 0.44–1.00)
Creatinine, Ser: 2.15 mg/dL — ABNORMAL HIGH (ref 0.44–1.00)
GFR calc Af Amer: 23 mL/min — ABNORMAL LOW (ref >60)
GFR calc Af Amer: 25 mL/min — ABNORMAL LOW (ref >60)
GFR calc non Af Amer: 20 mL/min — ABNORMAL LOW (ref >60)
GFR calc non Af Amer: 21 mL/min — ABNORMAL LOW (ref >60)
Glucose, Bld: 92 mg/dL (ref 70–99)
Glucose, Bld: 146 mg/dL — ABNORMAL HIGH (ref 70–99)
Potassium: 3.3 mmol/L — ABNORMAL LOW (ref 3.5–5.1)
Potassium: 4.1 mmol/L (ref 3.5–5.1)
Sodium: 136 mmol/L (ref 135–145)
Sodium: 134 mmol/L — ABNORMAL LOW (ref 135–145)

## 2019-07-28 LAB — CBC
HCT: 32.5 % — ABNORMAL LOW (ref 36.0–46.0)
Hemoglobin: 10.5 g/dL — ABNORMAL LOW (ref 12.0–15.0)
MCH: 30.6 pg (ref 26.0–34.0)
MCHC: 32.3 g/dL (ref 30.0–36.0)
MCV: 94.8 fL (ref 80.0–100.0)
Platelets: 284 10*3/uL (ref 150–400)
RBC: 3.43 MIL/uL — ABNORMAL LOW (ref 3.87–5.11)
RDW: 13.6 % (ref 11.5–15.5)
WBC: 9.1 10*3/uL (ref 4.0–10.5)
nRBC: 0 % (ref 0.0–0.2)

## 2019-07-28 LAB — GLUCOSE, CAPILLARY
Glucose-Capillary: 111 mg/dL — ABNORMAL HIGH (ref 70–99)
Glucose-Capillary: 122 mg/dL — ABNORMAL HIGH (ref 70–99)
Glucose-Capillary: 130 mg/dL — ABNORMAL HIGH (ref 70–99)
Glucose-Capillary: 143 mg/dL — ABNORMAL HIGH (ref 70–99)

## 2019-07-28 LAB — MRSA PCR SCREENING: MRSA by PCR: NEGATIVE

## 2019-07-28 LAB — APTT
aPTT: 96 seconds — ABNORMAL HIGH (ref 24–36)
aPTT: 96 seconds — ABNORMAL HIGH (ref 24–36)

## 2019-07-28 MED ORDER — METOPROLOL TARTRATE 12.5 MG HALF TABLET
12.5000 mg | ORAL_TABLET | Freq: Two times a day (BID) | ORAL | Status: DC
  Administered 2019-07-28: 13:00:00 12.5 mg via ORAL
  Filled 2019-07-28: qty 1

## 2019-07-28 MED ORDER — APIXABAN 5 MG PO TABS
5.0000 mg | ORAL_TABLET | Freq: Two times a day (BID) | ORAL | Status: DC
  Administered 2019-07-28 – 2019-07-30 (×4): 5 mg via ORAL
  Filled 2019-07-28 (×5): qty 1

## 2019-07-28 MED ORDER — POTASSIUM CHLORIDE CRYS ER 20 MEQ PO TBCR
40.0000 meq | EXTENDED_RELEASE_TABLET | Freq: Once | ORAL | Status: AC
  Administered 2019-07-28: 13:00:00 40 meq via ORAL
  Filled 2019-07-28: qty 2

## 2019-07-28 MED ORDER — METOPROLOL TARTRATE 12.5 MG HALF TABLET: 12.5000 mg | ORAL_TABLET | Freq: Three times a day (TID) | ORAL | Status: DC

## 2019-07-28 MED ORDER — METOPROLOL TARTRATE 25 MG PO TABS
25.0000 mg | ORAL_TABLET | Freq: Four times a day (QID) | ORAL | Status: DC
  Administered 2019-07-29 (×2): 25 mg via ORAL
  Filled 2019-07-28 (×2): qty 1

## 2019-07-28 MED ORDER — METOPROLOL TARTRATE 25 MG PO TABS: 25.0000 mg | ORAL_TABLET | Freq: Four times a day (QID) | ORAL | Status: DC

## 2019-07-28 MED ORDER — METOPROLOL TARTRATE 25 MG PO TABS
25.0000 mg | ORAL_TABLET | Freq: Three times a day (TID) | ORAL | Status: DC
  Administered 2019-07-28: 18:00:00 25 mg via ORAL
  Filled 2019-07-28: qty 1

## 2019-07-28 NOTE — Progress Notes (Signed)
Progress Note  Patient Name: Terry Hanson Date of Encounter: 07/28/2019  Primary Cardiologist: Sinclair Grooms, MD   Subjective   No CP or dyspnea; nausea improved  Inpatient Medications    Scheduled Meds: . clopidogrel  75 mg Oral Daily  . insulin aspart  0-15 Units Subcutaneous TID WC  . metoprolol tartrate  25 mg Oral TID  . rosuvastatin  40 mg Oral Daily  . vitamin B-12  500 mcg Oral Daily   Continuous Infusions: . sodium chloride 50 mL/hr at 07/27/19 2302  . heparin 1,000 Units/hr (07/27/19 2157)   PRN Meds: acetaminophen, nitroGLYCERIN, ondansetron (ZOFRAN) IV   Vital Signs    Vitals:   07/27/19 2359 07/28/19 0417 07/28/19 0719 07/28/19 0815  BP: 90/66 (!) 95/57 (!) 84/49 (!) 71/58  Pulse:   (!) 104 99  Resp: 18 20 20    Temp: 97.8 F (36.6 C) 97.8 F (36.6 C) 97.9 F (36.6 C)   TempSrc: Oral Oral Oral   SpO2:   98%   Weight:  88.7 kg      Intake/Output Summary (Last 24 hours) at 07/28/2019 0827 Last data filed at 07/28/2019 0502 Gross per 24 hour  Intake 283.5 ml  Output 200 ml  Net 83.5 ml   Last 3 Weights 07/28/2019 07/27/2019 07/15/2019  Weight (lbs) 195 lb 8.8 oz 199 lb 210 lb 6.4 oz  Weight (kg) 88.7 kg 90.266 kg 95.437 kg      Telemetry    Atrial fibrillation; rate upper normal to mildly elevated - Personally Reviewed  Physical Exam   GEN: No acute distress.   Neck: No JVD Cardiac: irregular Respiratory: Clear to auscultation bilaterally. GI: Soft, nontender, non-distended  MS: No edema Neuro:  Nonfocal  Psych: Normal affect   Labs    High Sensitivity Troponin:   Recent Labs  Lab 06/29/19 2226 06/29/19 2328 06/30/19 0150 07/08/19 0204 07/08/19 0524  TROPONINIHS >27,000* >27,000* >27,000* 2,827* 2,362*      Chemistry Recent Labs  Lab 07/27/19 1701 07/28/19 0413  NA 136 136  K 3.5 3.3*  CL 97* 100  CO2 25 21*  GLUCOSE 147* 92  BUN 35* 34*  CREATININE 2.47* 2.31*  CALCIUM 9.5 8.7*  PROT 7.6  --   ALBUMIN 3.3*  --    AST 31  --   ALT 35  --   ALKPHOS 40  --   BILITOT 1.0  --   GFRNONAA 18* 20*  GFRAA 21* 23*  ANIONGAP 14 15     Hematology Recent Labs  Lab 07/27/19 1701 07/28/19 0413  WBC 11.6* 9.1  RBC 3.81* 3.43*  HGB 11.4* 10.5*  HCT 35.2* 32.5*  MCV 92.4 94.8  MCH 29.9 30.6  MCHC 32.4 32.3  RDW 13.5 13.6  PLT 355 284    Patient Profile     79 y.o. female status post anterior myocardial infarction April 2021 with PCI of the proximal LAD, ischemic cardiomyopathy (discharged with LifeVest), chronic stage III kidney disease, diabetes mellitus, hyperlipidemia, recently placed on amiodarone and apixaban for atrial fibrillation and ventricular tachycardia noted on LifeVest admitted with progressive weakness, nausea/vomiting and diarrhea.  Assessment & Plan    1 nausea-likely secondary to amiodarone.  This medication has been discontinued and she feels better today.  We will continue to avoid.  2 acute on chronic stage III kidney disease-renal function is worse likely from dehydration.  She has had reduced p.o. intake for approximately 5 to 6 days.  Amiodarone has  been discontinued and her nausea has improved.  Continue gentle hydration and follow renal function.  Lasix is on hold.  3 coronary artery disease status post recent anterior infarct with PCI of LAD-continue Plavix and statin.  Her aspirin has been discontinued as she will require anticoagulation for atrial fibrillation.  4 persistent atrial fibrillation-I think her nausea was likely related to amiodarone.  We will continue IV heparin for now but resume apixaban in the next 24 to 48 hours.  Her blood pressure is borderline.  Decrease metoprolol to 12.5 mg twice daily.  Follow heart rate and adjust regimen as needed.  She did not tolerate amiodarone to help maintain sinus rhythm.  Tikosyn not an option given renal insufficiency.  Will ask electrophysiology to review.  5 ischemic cardiomyopathy-we will decrease metoprolol as blood  pressure is borderline.  Transition to Toprol at time of discharge once dose is stable.  We will add low-dose ARB or hydralazine nitrates later pending blood pressure and improvement in renal function.  6 ventricular tachycardia-apparently noted on LifeVest.  Continue beta-blocker.  Amiodarone has been discontinued.  Will ask electrophysiology to review.  Will reorder echocardiogram to see if LV function has improved.  7 hypokalemia-supplement  For questions or updates, please contact [facility] Please consult [email or web address] for contact info under        Signed, Olga Millers, MD  07/28/2019, 8:27 AM

## 2019-07-28 NOTE — Consult Note (Addendum)
ELECTROPHYSIOLOGY CONSULT NOTE    Patient ID: Terry Hanson MRN: [id number], DOB/AGE: 78-Apr-1942 78 y.o.  Admit date: 07/27/2019 Date of Consult: 07/28/2019  Primary Physician: Lucia Gaskins, MD Primary Cardiologist: Sinclair Grooms, MD  Electrophysiologist: New   Referring Provider: Dr. Stanford Breed  Patient Profile: Terry Hanson is a 79 y.o. female with a history of CAD s/p STEMI 06/2019 with PCI and DES to the LAD, chronic systolic CHF EF 19-62% with lifevest in place, DM2, HLD, and CKD who is being seen today for the evaluation of AF with RVR vs VT at the request of Dr. Stanford Breed.  HPI:  [name] is a 79 y.o. female with medical history above. She was sent home from admission last month with Lifevest in place. She returned to hospital 1 week later with recurrent CHF.  Seen again in clinic 07/15/2019. She was having SOB on Ticagrelor so this was changed to Plavix.   Pt noted alarms on her Lifevest. Thought to have been ?VT vs AF with RVR. She was placed on eliquis and amiodarone, and Lifevest threshold increased to 170. Amiodarone dose reduced due to nausea and vomiting.  She returned to office for acute visit 5/10 with SOB and significant nausea, vomiting, and diarrhea since starting on amiodarone and apixiban.  She felt like she was improving prior to starting these medications. With constellation of symptoms, she was admitted for further evaluation and treatment. EP asked to see for VT and AF considerations if amiodarone not an option.   Lifevest reports obtained. Tachy rates appear to be AF with RVR. No reported VF per Lifevest rep. Pts device did apply gel due to HR. It did NOT fire. Available episodes with no obvious WCT. AF RVR rates hovering near VT detection initially at 150, detection increased to 170.   Today she feels OK, but is anxious to go home and get things "back to normal". She denies SOB presently. Nausea has also improved off amiodarone. During episodes of tachycardia,  she denies chest pain, syncope, near syncope, or acute worsening SOB. At least once when her Lifevest alarmed, she was sleeping.  She had Nausea, Vomiting, and Diarrhea last week as above. She lives alone but daughter helps care for her daily. No one else with similar symptoms. No fevers, chills, or known sick contacts.   Past Medical History:  Diagnosis Date   Arthritis    Asthma    Diabetes mellitus ([place])    Diabetes mellitus without complication ([place])    Enlarged heart    Hypercholesterolemia    Hyperlipidemia    Ischemic cardiomyopathy    a. EF 30-35% by echo in 06/2019   STEMI (ST elevation myocardial infarction) [facility])    a. 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     Surgical History:  Past Surgical History:  Procedure Laterality Date   ABDOMINAL HYSTERECTOMY     CORONARY/GRAFT ACUTE MI REVASCULARIZATION N/A 06/29/2019   Procedure: Coronary/Graft Acute MI Revascularization;  Surgeon: Belva Crome, MD;  Location: [facility];  Service: Cardiovascular;  Laterality: N/A;   LEFT HEART CATH AND CORONARY ANGIOGRAPHY N/A 06/29/2019   Procedure: LEFT HEART CATH AND CORONARY ANGIOGRAPHY;  Surgeon: Belva Crome, MD;  Location: [facility];  Service: Cardiovascular;  Laterality: N/A;     Medications Prior to Admission  Medication Sig Dispense Refill Last Dose   apixaban (ELIQUIS) 5 MG TABS tablet Take 1 tablet (5 mg total) by mouth 2 (two)  times daily. 60 tablet 3 07/27/2019 at Unknown time   carvedilol (COREG) 3.125 MG tablet Take 3.125 mg by mouth 2 (two) times daily with a meal.   07/27/2019 at 8am, 8pm   clopidogrel (PLAVIX) 75 MG tablet Take 75 mg by mouth daily.   07/27/2019 at Unknown time   furosemide (LASIX) 40 MG tablet Take 1.5 tablets (60 mg total) by mouth daily. 30 tablet 1 07/27/2019 at Unknown time   metFORMIN (GLUCOPHAGE) 500 MG tablet Take 500 mg by mouth 2 (two) times daily.   07/27/2019 at Unknown time   nitroGLYCERIN (NITROSTAT)  0.4 MG SL tablet Place 1 tablet (0.4 mg total) under the tongue every 5 (five) minutes as needed. (Patient taking differently: Place 0.4 mg under the tongue every 5 (five) minutes as needed for chest pain. ) 25 tablet 2 unknown   potassium chloride SA (KLOR-CON) 20 MEQ tablet Take 1 tablet (20 mEq total) by mouth daily. 7 tablet 0 07/27/2019 at Unknown time   rosuvastatin (CRESTOR) 40 MG tablet Take 1 tablet (40 mg total) by mouth daily. 90 tablet 1 07/27/2019 at Unknown time   vitamin B-12 (CYANOCOBALAMIN) 500 MCG tablet Take 1 tablet (500 mcg total) by mouth daily.   07/27/2019 at Unknown time    Inpatient Medications:   clopidogrel  75 mg Oral Daily   insulin aspart  0-15 Units Subcutaneous TID WC   metoprolol tartrate  12.5 mg Oral BID   potassium chloride  40 mEq Oral Once   rosuvastatin  40 mg Oral Daily   vitamin B-12  500 mcg Oral Daily    Allergies:  Allergies  Allergen Reactions   Codeine Hives   Penicillins Hives   Shrimp [Shellfish Allergy]     Broke out   Sulfa Antibiotics Itching   Codeine Itching and Rash   Penicillins Itching and Rash    Did it involve swelling of the face/tongue/throat, SOB, or low BP? Yes Did it involve sudden or severe rash/hives, skin peeling, or any reaction on the inside of your mouth or nose? Yes Did you need to seek medical attention at a hospital or doctor's office? Yes When did it last happen? Over 10 years If all above answers are NO, may proceed with cephalosporin use.    Sulfa Antibiotics Itching and Rash    Social History   Socioeconomic History   Marital status: Married    Spouse name: Not on file   Number of children: Not on file   Years of education: Not on file   Highest education level: Not on file  Occupational History   Not on file  Tobacco Use   Smoking status: Never Smoker   Smokeless tobacco: Never Used  Substance and Sexual Activity   Alcohol use: Not Currently   Drug use: Never     Sexual activity: Not on file  Other Topics Concern   Not on file  Social History Narrative   ** Merged History Encounter **       Social Determinants of Health   Financial Resource Strain:    Difficulty of Paying Living Expenses:   Food Insecurity:    Worried About Programme researcher, broadcasting/film/video in the Last Year:    Barista in the Last Year:   Transportation Needs:    Freight forwarder (Medical):    Lack of Transportation (Non-Medical):   Physical Activity:    Days of Exercise per Week:    Minutes of Exercise per Session:  Stress:    Feeling of Stress :   Social Connections:    Frequency of Communication with Friends and Family:    Frequency of Social Gatherings with Friends and Family:    Attends Religious Services:    Active Member of Clubs or Organizations:    Attends Engineer, structural:    Marital Status:   Intimate Partner Violence:    Fear of Current or Ex-Partner:    Emotionally Abused:    Physically Abused:    Sexually Abused:      Family History  Problem Relation Age of Onset   Heart disease Mother      Review of Systems: All other systems reviewed and are otherwise negative except as noted above.  Physical Exam: Vitals:   07/27/19 2359 07/28/19 0417 07/28/19 0719 07/28/19 0815  BP: 90/66 (!) 95/57 (!) 84/49 (!) 71/58  Pulse:   (!) 104 99  Resp: Temp: 97.8 F (36.6 C) 97.8 F (36.6 C) 97.9 F (36.6 C)   TempSrc: Oral Oral Oral   SpO2:   98%   Weight:  88.7 kg      GEN- The patient is well appearing, alert and oriented x 3 today.   HEENT: normocephalic, atraumatic; sclera clear, conjunctiva pink; hearing intact; oropharynx clear; neck supple Lungs- Clear to ausculation bilaterally, normal work of breathing.  No wheezes, rales, rhonchi Heart- Regular rate and rhythm, no murmurs, rubs or gallops GI- soft, non-tender, non-distended, bowel sounds present Extremities- no clubbing, cyanosis, or edema;  DP/PT/radial pulses 2+ bilaterally MS- no significant deformity or atrophy Skin- warm and dry, no rash or lesion Psych- euthymic mood, full affect Neuro- strength and sensation are intact  Labs:   Lab Results  Component Value Date   WBC 9.1 07/28/2019   HGB 10.5 (L) 07/28/2019   HCT 32.5 (L) 07/28/2019   MCV 94.8 07/28/2019   PLT 284 07/28/2019    Recent Labs  Lab 07/27/19 1701 07/27/19 1701 07/28/19 0413  NA 136   < > 136  K 3.5   < > 3.3*  CL 97*   < > 100  CO2 25   < > 21*  BUN 35*   < > 34*  CREATININE 2.47*   < > 2.31*  CALCIUM 9.5   < > 8.7*  PROT 7.6  --   --   BILITOT 1.0  --   --   ALKPHOS 40  --   --   ALT 35  --   --   AST 31  --   --   GLUCOSE 147*   < > 92   < > = values in this interval not displayed.      Radiology/Studies: CARDIAC CATHETERIZATION  Result Date: 06/29/2019  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  99% stenosis/thrombotic with TIMI I flow reduced to 0% with TIMI grade III flow following stent implantation using a 32 x 2.5 mm diameter onyx postdilated to 3.0 mm throughout the proximal two thirds of the stent.  The LAD is otherwise widely patent.  Less than 20% proximal left main  Small first obtuse marginal with proximal 70% narrowing.  Mid to distal circumflex with 60% narrowing.  Right coronary arises somewhat anterior.  There is 40 to 50% proximal/ostial narrowing.  There is 70% hazy eccentric mid RCA stenosis.  LVEDP 40, consistent with acute systolic heart failure.  Prior status of the left ventricle was not known.  RECOMMENDATIONS:  Management of acute presumed systolic heart failure: BiPAP; IV Lasix; Foley catheter; IV nitroglycerin 10-100 mics to keep systolic blood pressure 110 to 120 mmHg.  High intensity statin therapy  Once volume overload has resolved, beta-blocker and angiotensin receptor/ACE/Arni therapy should be considered  2D Doppler echocardiogram to assess LV function in a.m.   Medical therapy of right coronary and circumflex disease.  Aggressive risk factor modification.  DG Chest Port 1 View  Result Date: 07/08/2019 CLINICAL DATA:  Shortness of breath. Recent myocardial infarction. EXAM: PORTABLE CHEST 1 VIEW COMPARISON:  Radiograph 06/29/2019 FINDINGS: Overlying artifact from life vest/monitoring device. Low lung volumes. Mild cardiomegaly. Prior perihilar opacities have near completely resolved likely pulmonary edema. No pleural fluid, pneumothorax, or confluent airspace disease. IMPRESSION: Mild cardiomegaly. Resolved pulmonary edema from exam earlier this month. Electronically Signed   By: Narda Rutherford M.D.   On: 07/08/2019 02:42   DG Chest Port 1 View  Result Date: 06/29/2019 CLINICAL DATA:  Chest pain EXAM: PORTABLE CHEST 1 VIEW COMPARISON:  05/27/2018 FINDINGS: Hazy opacities throughout the left lung. Slight parahilar opacity in the right lung. Cardiomediastinal contours are normal. No pleural effusion or pneumothorax. IMPRESSION: Left-greater-than-right pulmonary opacities favored to indicate pulmonary edema. Electronically Signed   By: Deatra Robinson M.D.   On: 06/29/2019 22:15   ECHOCARDIOGRAM COMPLETE  Result Date: 06/30/2019    ECHOCARDIOGRAM REPORT   Patient Name:   Lonni Duve  Date of Exam: 06/30/2019 Medical Rec #:  [id number]  Height:       65.0 in Accession #:    [id number] Weight:       218.3 lb Date of Birth:  23-Jan-1941   BSA:          2.053 m Patient Age:    78 years   BP:           117/64 mmHg Patient Gender: F          HR:           92 bpm. Exam Location:  Inpatient Procedure: 2D Echo, Cardiac Doppler and Color Doppler Indications:    I50.23 Acute on chronic systolic (congestive) heart failure  History:        Patient has no prior history of Echocardiogram examinations.                 Cardiomegaly; Risk Factors:Hypertension and Diabetes.  Sonographer:    Elmarie Shiley Dance Referring Phys: [phone] Barry Dienes [facility]  Sonographer Comments: Suboptimal subcostal  window. IMPRESSIONS  1. Left ventricular ejection fraction, by estimation, is 30 to 35%. The left ventricle has moderately decreased function. The left ventricle demonstrates regional wall motion abnormalities (see scoring diagram/findings for description). There is moderate left ventricular hypertrophy. Left ventricular diastolic parameters are consistent with Grade I diastolic dysfunction (impaired relaxation). There is mild dyskinesis of the left ventricular, apex. There is severe hypokinesis of the left ventricular, mid-apical anterior wall, anteroseptal wall, inferior wall and inferoseptal wall. No intracavitary thrombus is seen, but images are suboptimal.  2. Right ventricular systolic function is normal. The right ventricular size is normal. There is mildly elevated pulmonary artery systolic pressure. The estimated right ventricular systolic pressure is 36.2 mmHg.  3. Left atrial size was mildly dilated.  4. The mitral valve is normal in structure. No evidence of mitral valve regurgitation.  5. Aortic valve gradients are underestimated due to poor left ventricular systolic function. The aortic valve is tricuspid. Aortic valve regurgitation is not visualized. Mild aortic valve stenosis.  6. The inferior vena cava is dilated in size with <50% respiratory variability, suggesting right atrial pressure of 15 mmHg. FINDINGS  Left Ventricle: Left ventricular ejection fraction, by estimation, is 30 to 35%. The left ventricle has moderately decreased function. The left ventricle demonstrates regional wall motion abnormalities. Mild dyskinesis of the left ventricular, entire apical segment. Severe hypokinesis of the left ventricular, mid-apical anterior wall, anteroseptal wall, inferior wall and inferoseptal wall. The left ventricular internal cavity size was normal in size. There is moderate left ventricular hypertrophy. Left ventricular diastolic parameters are consistent with Grade I diastolic dysfunction (impaired  relaxation). Normal left ventricular filling pressure. Right Ventricle: The right ventricular size is normal. No increase in right ventricular wall thickness. Right ventricular systolic function is normal. There is mildly elevated pulmonary artery systolic pressure. The tricuspid regurgitant velocity is 2.30  m/s, and with an assumed right atrial pressure of 15 mmHg, the estimated right ventricular systolic pressure is 36.2 mmHg. Left Atrium: Left atrial size was mildly dilated. Right Atrium: Right atrial size was normal in size. Pericardium: There is no evidence of pericardial effusion. Mitral Valve: The mitral valve is normal in structure. Mild mitral annular calcification. No evidence of mitral valve regurgitation. Tricuspid Valve: The tricuspid valve is normal in structure. Tricuspid valve regurgitation is trivial. Aortic Valve: Aortic valve gradients are underestimated due to poor left ventricular systolic function. The aortic valve is tricuspid. Aortic valve regurgitation is not visualized. Mild aortic stenosis is present. Aortic valve mean gradient measures 7.7 mmHg. Aortic valve peak gradient measures 15.3 mmHg. Dimensionless index is 0.43. Pulmonic Valve: The pulmonic valve was not well visualized. Pulmonic valve regurgitation is not visualized. Aorta: The aortic root and ascending aorta are structurally normal, with no evidence of dilitation. Venous: The inferior vena cava is dilated in size with less than 50% respiratory variability, suggesting right atrial pressure of 15 mmHg. IAS/Shunts: No atrial level shunt detected by color flow Doppler.  LEFT VENTRICLE PLAX 2D LVIDd:         2.70 cm  Diastology LVIDs:         2.10 cm  LV e' lateral:   5.00 cm/s LV PW:         1.70 cm  LV E/e' lateral: 10.4 LV IVS:        1.40 cm  LV e' medial:    3.81 cm/s LVOT diam:     1.90 cm  LV E/e' medial:  13.6 LV SV:         51 LV SV Index:   25 LVOT Area:     2.84 cm  RIGHT VENTRICLE             IVC RV Basal diam:  2.10 cm      IVC diam: 2.00 cm RV S prime:     12.50 cm/s TAPSE (M-mode): 2.0 cm LEFT ATRIUM           Index       RIGHT ATRIUM          Index LA diam:      3.70 cm 1.80 cm/m  RA Area:     9.22 cm LA Vol (A2C): 54.6 ml 26.60 ml/m RA Volume:   18.10 ml 8.82 ml/m LA Vol (A4C): 44.5 ml 21.68 ml/m  AORTIC VALVE AV Area (Vmax): 1.23 cm AV Vmax:        195.46 cm/s AV Peak Grad:   15.3 mmHg AV Mean Grad:   7.7 mmHg LVOT Vmax:  85.00 cm/s LVOT Vmean:     56.300 cm/s LVOT VTI:       0.180 m  AORTA Ao Root diam: 3.00 cm Ao Asc diam:  3.40 cm MITRAL VALVE                TRICUSPID VALVE MV Area (PHT): 3.99 cm     TR Peak grad:   21.2 mmHg MV Decel Time: 190 msec     TR Vmax:        230.00 cm/s MV E velocity: 52.00 cm/s MV A velocity: 106.00 cm/s  SHUNTS MV E/A ratio:  0.49         Systemic VTI:  0.18 m                             Systemic Diam: 1.90 cm Mihai Croitoru MD Electronically signed by Thurmon Fair MD Signature Date/Time: 06/30/2019/9:21:52 AM    Final    EKG: today shows AF with rates at 106 bpm (personally reviewed)  TELEMETRY: Atrial fibrillation with variable rate control; as high as 130-140s, currently 90-100s (personally reviewed)  DEVICE HISTORY: Lifevest placed after admission 06/2019 for STEMI with new systolic CHF.   Assessment/Plan: 1.  Afib with RVR Tele shows AF with RVR Lifevest strips reviewed and appear to show AF with RVR. Confirmed with Lifevest Rep that no VT reported. Gel applied due to HRs, with no therapy delivered.  Lopressor decreased to 12.5 mg BID. Was on coreg PTA. Note plan for transition to Toprol once dose is stable. Would increase as tolerated for rate control.  CHA2DS2VASC of at least 7. She will continue to require OAC. Currently covered on heparin. Was on Eliquis as outpatient, but only since 07/22/2019. Unclear how long she had been in AF, but first reported symptoms of new SOB 5/4. Discussed DCCV vs TEE/DCCV. She would prefer to avoid TEE if possible.  Could attempt  rate control for now, let amio wash out, and plan DCCV +/- tikosyn once in several weeks with optimization (pending Cr improvement for tikosyn dosing.  Baseline QTc ~420-430 ms.  2. ARF on CKD III Cr up to 2.3. Baseline appears 1.4-1.6 Thought 2/2 dehydration in the setting of poor oral intake.  Lasix on hold. Follow  3. Chronic systolic CHF, ICM EF 30-35%. Pending repeat echo per general notes. Per [facility]. Med titration has thus far been limited by symptoms and hypotension at times.   4. CAD Currently denies ischemic symptoms. Off ASA with OAC for Atrial fib Remains on plavix with recent stenting.  Will discuss plan further with Dr. Johney Frame. Continue optimization of renal dysfunction and CHF.  Will try and bump lopressor back up for rate control. Will increase to 25 mg TID with dose now and this pm with most recent BP in 90s. Can increase further in am as tolerated for rate control.   For questions or updates, please contact [facility] Please consult [email or web address] for contact info under Cardiology/STEMI.  Signed, Graciella Freer, PA-C  07/28/2019 9:43 AM   I have seen, examined the patient, and reviewed the above assessment and plan.  Changes to above are made where necessary.  On exam, iRRR.  The patient presents with afib with RVR after recent MI. She did not tolerate amiodarone. I would advise rate control currently. Start metorolol  q6 hours and titrate aggressively until heart rates are controlled.  Consolidate metoprolol to BID prior to discharge. We could eventually consider  tikosyn, however given recent amiodarone, I would not advise that we do so this admission.  Once rate controlled, I would advise close follow-up in the AF clinic.  Co Sign: Hillis Range, MD 07/28/2019 10:43 PM

## 2019-07-28 NOTE — Progress Notes (Signed)
ANTICOAGULATION CONSULT NOTE  Pharmacy Consult:  Heparin >> Eliquis Indication: atrial fibrillation  Allergies  Allergen Reactions  . Codeine Hives  . Penicillins Hives  . Shrimp [Shellfish Allergy]     Broke out  . Sulfa Antibiotics Itching  . Codeine Itching and Rash  . Penicillins Itching and Rash    Did it involve swelling of the face/tongue/throat, SOB, or low BP? Yes Did it involve sudden or severe rash/hives, skin peeling, or any reaction on the inside of your mouth or nose? Yes Did you need to seek medical attention at a hospital or doctor's office? Yes When did it last happen? Over 10 years If all above answers are "NO", may proceed with cephalosporin use.   . Sulfa Antibiotics Itching and Rash    Patient Measurements: Heparin dosing wt: 71kg  Vital Signs: Temp: 97.6 F (36.4 C) (05/11 1148) Temp Source: Oral (05/11 1148) BP: 102/68 (05/11 1230) Pulse Rate: 93 (05/11 1230)  Labs: Recent Labs    07/27/19 1701 07/28/19 0413 07/28/19 1021  HGB 11.4* 10.5*  --   HCT 35.2* 32.5*  --   PLT 355 284  --   APTT 55* 96* 96*  LABPROT 23.4*  --   --   INR 2.2*  --   --   HEPARINUNFRC >2.20*  --   --   CREATININE 2.47* 2.31*  --     Estimated Creatinine Clearance: 20.8 mL/min (A) (by C-G formula based on SCr of 2.31 mg/dL (H)).   Medical History: Past Medical History:  Diagnosis Date  . Arthritis   . Asthma   . Diabetes mellitus ([facility])   . Diabetes mellitus without complication ([facility])   . Enlarged heart   . Hypercholesterolemia   . Hyperlipidemia   . Ischemic cardiomyopathy    a. EF 30-35% by echo in 06/2019  . STEMI (ST elevation myocardial infarction) ([facility])    a. 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     Assessment: 79 yo female on apixaban PTA for afib (last dose on the morning of 5/10).  She is noted with nausea and vomiting and pharmacy consulted to dose heparin.  Now to transition back to Eliquis.  Nausea and vomiting have improved  per RN.  Age < 80 and weight > 60 kg; therefore, she still qualifies for full Eliquis dosing.  Noted SCr is elevated, but is trending down.  Goal of Therapy:  Appropriate anticoagulation Monitor platelets by anticoagulation protocol: Yes   Plan:  Stop IV heparin when Eliquis is administered (RN aware) Eliquis 5mg  PO BID Pharmacy will sign off and follow peripherally.  Thank you for the consult!  Jeree Delcid D. , PharmD, BCPS, BCCCP 07/28/2019, 4:26 PM

## 2019-07-28 NOTE — Progress Notes (Addendum)
ANTICOAGULATION CONSULT NOTE   Pharmacy Consult for heparin  Indication: atrial fibrillation  Allergies  Allergen Reactions  . Codeine Hives  . Penicillins Hives  . Shrimp [Shellfish Allergy]     Broke out  . Sulfa Antibiotics Itching  . Codeine Itching and Rash  . Penicillins Itching and Rash    Did it involve swelling of the face/tongue/throat, SOB, or low BP? Yes Did it involve sudden or severe rash/hives, skin peeling, or any reaction on the inside of your mouth or nose? Yes Did you need to seek medical attention at a hospital or doctor's office? Yes When did it last happen? Over 10 years If all above answers are "NO", may proceed with cephalosporin use.   . Sulfa Antibiotics Itching and Rash    Patient Measurements: Heparin dosing wt: 71kg  Vital Signs: Temp: 97.9 F (36.6 C) (05/11 0719) Temp Source: Oral (05/11 0719) BP: 84/49 (05/11 0719) Pulse Rate: 104 (05/11 0719)  Labs: Recent Labs    07/27/19 1701 07/28/19 0413  HGB 11.4* 10.5*  HCT 35.2* 32.5*  PLT 355 284  APTT 55* 96*  LABPROT 23.4*  --   INR 2.2*  --   HEPARINUNFRC >2.20*  --   CREATININE 2.47* 2.31*    Estimated Creatinine Clearance: 20.8 mL/min (A) (by C-G formula based on SCr of 2.31 mg/dL (H)).   Medical History: Past Medical History:  Diagnosis Date  . Arthritis   . Asthma   . Diabetes mellitus ([facility])   . Diabetes mellitus without complication ([facility])   . Enlarged heart   . Hypercholesterolemia   . Hyperlipidemia   . Ischemic cardiomyopathy    a. EF 30-35% by echo in 06/2019  . STEMI (ST elevation myocardial infarction) ([facility])    a. 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     Assessment: 79 yo female on apixaban PTA for afib (last dose on the morning og 5/10).  She is noted with nausea and vomiting and pharmacy consulted to dose heparin .   Aptt this am is within range at 96s on 1000 units/hr repeat level unchanged. Recent apixaban use so will continue to use aptt  for monitoring. Hgb down slightly to 10.5, plt wnl. No bleeding noted.   Goal of Therapy:  Heparin level 0.3-0.7 units/ml aPTT 66-102 seconds Monitor platelets by anticoagulation protocol: Yes   Plan:  -Continue heparin at 1000 units/hr  -Daily with CBC daily -Heparin level daily  Sheppard Coil PharmD., BCPS Clinical Pharmacist 07/28/2019 7:48 AM

## 2019-07-29 ENCOUNTER — Encounter (HOSPITAL_COMMUNITY): Payer: Self-pay | Admitting: Internal Medicine

## 2019-07-29 ENCOUNTER — Ambulatory Visit: Payer: Medicare Other | Admitting: Physician Assistant

## 2019-07-29 LAB — GLUCOSE, CAPILLARY
Glucose-Capillary: 115 mg/dL — ABNORMAL HIGH (ref 70–99)
Glucose-Capillary: 176 mg/dL — ABNORMAL HIGH (ref 70–99)
Glucose-Capillary: 87 mg/dL (ref 70–99)
Glucose-Capillary: 125 mg/dL — ABNORMAL HIGH (ref 70–99)

## 2019-07-29 LAB — BASIC METABOLIC PANEL
Anion gap: 12 (ref 5–15)
BUN: 29 mg/dL — ABNORMAL HIGH (ref 8–23)
CO2: 19 mmol/L — ABNORMAL LOW (ref 22–32)
Calcium: 8.6 mg/dL — ABNORMAL LOW (ref 8.9–10.3)
Chloride: 103 mmol/L (ref 98–111)
Creatinine, Ser: 2.25 mg/dL — ABNORMAL HIGH (ref 0.44–1.00)
GFR calc Af Amer: 23 mL/min — ABNORMAL LOW (ref >60)
GFR calc non Af Amer: 20 mL/min — ABNORMAL LOW (ref >60)
Glucose, Bld: 131 mg/dL — ABNORMAL HIGH (ref 70–99)
Potassium: 3.8 mmol/L (ref 3.5–5.1)
Sodium: 134 mmol/L — ABNORMAL LOW (ref 135–145)

## 2019-07-29 MED ORDER — MELATONIN 5 MG PO TABS
5.0000 mg | ORAL_TABLET | Freq: Every evening | ORAL | Status: DC | PRN
  Administered 2019-07-29: 23:00:00 5 mg via ORAL
  Filled 2019-07-29: qty 1

## 2019-07-29 MED ORDER — METOPROLOL TARTRATE 50 MG PO TABS: 50.0000 mg | ORAL_TABLET | Freq: Four times a day (QID) | ORAL | Status: DC

## 2019-07-29 MED ORDER — ALBUTEROL SULFATE HFA 108 (90 BASE) MCG/ACT IN AERS: 2.0000 | INHALATION_SPRAY | Freq: Four times a day (QID) | RESPIRATORY_TRACT | Status: DC | PRN

## 2019-07-29 MED ORDER — METOPROLOL TARTRATE 100 MG PO TABS
100.0000 mg | ORAL_TABLET | Freq: Two times a day (BID) | ORAL | Status: DC
  Administered 2019-07-29 (×2): 100 mg via ORAL
  Filled 2019-07-29 (×2): qty 1

## 2019-07-29 MED ORDER — TRAZODONE HCL 50 MG PO TABS: 50.0000 mg | ORAL_TABLET | Freq: Every evening | ORAL | Status: DC | PRN

## 2019-07-29 MED ORDER — ALBUTEROL SULFATE (2.5 MG/3ML) 0.083% IN NEBU
2.5000 mg | INHALATION_SOLUTION | Freq: Four times a day (QID) | RESPIRATORY_TRACT | Status: DC | PRN
  Administered 2019-07-29: 16:00:00 2.5 mg via RESPIRATORY_TRACT
  Filled 2019-07-29: qty 3

## 2019-07-29 NOTE — Progress Notes (Addendum)
Electrophysiology Rounding Note  Patient Name: Terry Hanson Date of Encounter: 07/29/2019  Primary Cardiologist: Terry Grooms, MD Electrophysiologist: New to Dr. Rayann Hanson   Subjective   The patient is doing well today. She feels much better with increased rate control and off amiodarone. At this time, the patient denies chest pain, shortness of breath, or any new concerns.  Inpatient Medications    Scheduled Meds: . apixaban  5 mg Oral BID  . clopidogrel  75 mg Oral Daily  . insulin aspart  0-15 Units Subcutaneous TID WC  . metoprolol tartrate  50 mg Oral Q6H  . rosuvastatin  40 mg Oral Daily  . vitamin B-12  500 mcg Oral Daily   Continuous Infusions:  PRN Meds: acetaminophen, nitroGLYCERIN, ondansetron (ZOFRAN) IV   Vital Signs    Vitals:   07/28/19 2018 07/28/19 2356 07/29/19 0428 07/29/19 0637  BP: 102/66 104/64 (!) 90/56 (!) 95/55  Pulse:    (!) 104  Resp: 18 18 20    Temp: 97.8 F (36.6 C) 98 F (36.7 C) 98.5 F (36.9 C) 98.5 F (36.9 C)  TempSrc: Oral Oral Oral Oral  SpO2:    99%  Weight:   91 kg     Intake/Output Summary (Last 24 hours) at 07/29/2019 0804 Last data filed at 07/29/2019 0432 Gross per 24 hour  Intake 360 ml  Output 950 ml  Net -590 ml   Filed Weights   07/28/19 0417 07/29/19 0428  Weight: 88.7 kg 91 kg    Physical Exam    GEN- The patient is well appearing, alert and oriented x 3 today.   Head- normocephalic, atraumatic Eyes-  Sclera clear, conjunctiva pink Ears- hearing intact Oropharynx- clear Neck- supple Lungs- Clear to ausculation bilaterally, normal work of breathing Heart- Irregular rate and rhythm, no murmurs, rubs or gallops GI- soft, NT, ND, + BS Extremities- no clubbing, cyanosis, or edema Skin- no rash or lesion Psych- euthymic mood, full affect Neuro- strength and sensation are intact  Labs    CBC Recent Labs    07/27/19 1701 07/28/19 0413  WBC 11.6* 9.1  NEUTROABS 8.8*  --   HGB 11.4* 10.5*  HCT  35.2* 32.5*  MCV 92.4 94.8  PLT 355 657   Basic Metabolic Panel Recent Labs    07/28/19 1057 07/29/19 0501  NA 134* 134*  K 4.1 3.8  CL 102 103  CO2 21* 19*  GLUCOSE 146* 131*  BUN 33* 29*  CREATININE 2.15* 2.25*  CALCIUM 8.6* 8.6*   Liver Function Tests Recent Labs    07/27/19 1701  AST 31  ALT 35  ALKPHOS 40  BILITOT 1.0  PROT 7.6  ALBUMIN 3.3*   No results for input(s): LIPASE, AMYLASE in the last 72 hours. Cardiac Enzymes No results for input(s): CKTOTAL, CKMB, CKMBINDEX, TROPONINI in the last 72 hours.   Telemetry    AF with improved rate control 80-100s mostly (personally reviewed)  Radiology    No results found.  Patient Profile     Terry Hanson is a 79 y.o. female with a history of CAD s/p STEMI 06/2019 with PCI and DES to the LAD, chronic systolic CHF EF 84-69% with lifevest in place, DM2, HLD, and CKD who is being seen for the evaluation of AF with RVR at the request of Dr. Stanford Hanson.  Assessment & Plan    1.  Afib with RVR Tele shows AF with improved rate control on increased lopressor Her BP has tolerated  Lopressor 25 q 6 hrs.  Lopressor increased to 50 mg q 6 hours. Would consolidate to Toprol 100 mg BID on discharge as tolerated.  Would avoid AAD currently, and focus on rate control with close AF clinic follow up.  Can revisit AAD once chronically anticoagulated and kidney function has levelled out. Baseline QTc ~420-430 ms.  2. ARF on CKD III Cr remains elevated at 2.25 Previous baseline 1.4-1.6  3. Chronic systolic CHF, ICM LVEF 30-35% Volume status stable on exam.  Per [facility]. Med titration has thus far been limited by symptoms and hypotension at times. Further complicated by need for rate control  4. CAD Denies ischemic symptoms Off ASA with OAC for AF.   Pt feels much better with rate control and off amiodarone (nausea). Will make appointment for follow up in AF clinic next week. Can consider alternate AADs down the road on [facility]  and once ARF resolves.   EP to see as needed while here. Please call with further questions.   For questions or updates, please contact [facility] Please consult [email or web address] for contact info under Cardiology/STEMI.  Signed, Terry Freer, PA-C  07/29/2019, 8:04 AM    I have seen, examined the patient, and reviewed the above assessment and plan.  Changes to above are made where necessary.  On exam, iRRR.  Patient feels "better" with rate control. Did not tolerate amiodarone.  Given renal failure, I would not advise tikosyn currently.  This could be considered down the road. I would advise rate control for now.  Continue to titrate short activing metoprolol to goal HR and then consolidate.  Electrophysiology team to see as needed while here. Please call with questions.   Co Sign: Terry Range, MD

## 2019-07-29 NOTE — Progress Notes (Signed)
Progress Note  Patient Name: Terry Hanson Date of Encounter: 07/29/2019  Primary Cardiologist: Sinclair Grooms, MD   Subjective   Pt denies CP or dyspnea; nausea resolved  Inpatient Medications    Scheduled Meds: . apixaban  5 mg Oral BID  . clopidogrel  75 mg Oral Daily  . insulin aspart  0-15 Units Subcutaneous TID WC  . metoprolol tartrate  50 mg Oral Q6H  . rosuvastatin  40 mg Oral Daily  . vitamin B-12  500 mcg Oral Daily   Continuous Infusions:  PRN Meds: acetaminophen, nitroGLYCERIN, ondansetron (ZOFRAN) IV   Vital Signs    Vitals:   07/28/19 2018 07/28/19 2356 07/29/19 0428 07/29/19 0637  BP: 102/66 104/64 (!) 90/56 (!) 95/55  Pulse:    (!) 104  Resp: 18 18 20    Temp: 97.8 F (36.6 C) 98 F (36.7 C) 98.5 F (36.9 C) 98.5 F (36.9 C)  TempSrc: Oral Oral Oral Oral  SpO2:    99%  Weight:   91 kg     Intake/Output Summary (Last 24 hours) at 07/29/2019 0800 Last data filed at 07/29/2019 0432 Gross per 24 hour  Intake 360 ml  Output 950 ml  Net -590 ml   Last 3 Weights 07/29/2019 07/28/2019 07/27/2019  Weight (lbs) 200 lb 11.2 oz 195 lb 8.8 oz 199 lb  Weight (kg) 91.037 kg 88.7 kg 90.266 kg      Telemetry    Atrial fibrillation; rate upper normal  - Personally Reviewed  Physical Exam   GEN: No acute distress.  WD Neck: No JVD, supple Cardiac: irregular, no murmur Respiratory: CTA GI: Soft, NT/ND MS: No edema Neuro:  Grossly intact Psych: Normal affect   Labs    High Sensitivity Troponin:   Recent Labs  Lab 06/29/19 2226 06/29/19 2328 06/30/19 0150 07/08/19 0204 07/08/19 0524  TROPONINIHS >27,000* >27,000* >27,000* 2,827* 2,362*      Chemistry Recent Labs  Lab 07/27/19 1701 07/27/19 1701 07/28/19 0413 07/28/19 1057 07/29/19 0501  NA 136   < > 136 134* 134*  K 3.5   < > 3.3* 4.1 3.8  CL 97*   < > 100 102 103  CO2 25   < > 21* 21* 19*  GLUCOSE 147*   < > 92 146* 131*  BUN 35*   < > 34* 33* 29*  CREATININE 2.47*   < > 2.31*  2.15* 2.25*  CALCIUM 9.5   < > 8.7* 8.6* 8.6*  PROT 7.6  --   --   --   --   ALBUMIN 3.3*  --   --   --   --   AST 31  --   --   --   --   ALT 35  --   --   --   --   ALKPHOS 40  --   --   --   --   BILITOT 1.0  --   --   --   --   GFRNONAA 18*   < > 20* 21* 20*  GFRAA 21*   < > 23* 25* 23*  ANIONGAP 14   < > 15 11 12    < > = values in this interval not displayed.     Hematology Recent Labs  Lab 07/27/19 1701 07/28/19 0413  WBC 11.6* 9.1  RBC 3.81* 3.43*  HGB 11.4* 10.5*  HCT 35.2* 32.5*  MCV 92.4 94.8  MCH 29.9 30.6  MCHC 32.4 32.3  RDW 13.5 13.6  PLT 355 284    Patient Profile     79 y.o. female status post anterior myocardial infarction April 2021 with PCI of the proximal LAD, ischemic cardiomyopathy (discharged with LifeVest), chronic stage III kidney disease, diabetes mellitus, hyperlipidemia, recently placed on amiodarone and apixaban for atrial fibrillation and ventricular tachycardia noted on LifeVest admitted with progressive weakness, nausea/vomiting and diarrhea.  Assessment & Plan    1 nausea-resolved; likely secondary to amiodarone.  This medication has been discontinued.  2 acute on chronic stage III kidney disease-renal function unchanged today.  Previously this was felt secondary to nausea with decreased p.o. intake/dehydration.  We will continue to hold Lasix.  We will follow renal function closely.   3 coronary artery disease status post recent anterior infarct with PCI of LAD-continue Plavix and statin.  Her aspirin has been discontinued as she will require anticoagulation for atrial fibrillation.  4 persistent atrial fibrillation-amiodarone has been discontinued because of nausea.  Plan for now is rate control and anticoagulation.  Will change metoprolol to 100 mg twice daily.  Transition to Toprol tomorrow morning if pulse and blood pressure stable.  Continue apixaban 5 mg twice daily.  If her heart rate is stable will plan discharge tomorrow morning and  follow-up with atrial fibrillation clinic.  May consider Tikosyn in the future if renal function continues to improve.  5 ischemic cardiomyopathy-change metoprolol to 100 mg twice daily and transition to Toprol tomorrow if pulse and blood pressure stable.  We will add low-dose ARB or hydralazine/nitrates later pending blood pressure and improvement in renal function.  6 ventricular tachycardia-this was reviewed by electrophysiology and there was no ventricular tachycardia.  We will continue LifeVest at discharge given recent myocardial infarction and reduced LV function.  7 hypokalemia-resolved.  Ambulate today and follow heart rate.  If stable will plan discharge tomorrow morning.  For questions or updates, please contact [facility] Please consult [email or web address] for contact info under        Signed, Olga Millers, MD  07/29/2019, 8:00 AM

## 2019-07-30 ENCOUNTER — Other Ambulatory Visit: Payer: Self-pay | Admitting: Physician Assistant

## 2019-07-30 ENCOUNTER — Telehealth: Payer: Self-pay | Admitting: Interventional Cardiology

## 2019-07-30 DIAGNOSIS — N183 Chronic kidney disease, stage 3 unspecified: Secondary | ICD-10-CM

## 2019-07-30 LAB — BASIC METABOLIC PANEL
Anion gap: 8 (ref 5–15)
BUN: 29 mg/dL — ABNORMAL HIGH (ref 8–23)
CO2: 24 mmol/L (ref 22–32)
Calcium: 8.9 mg/dL (ref 8.9–10.3)
Chloride: 103 mmol/L (ref 98–111)
Creatinine, Ser: 2.17 mg/dL — ABNORMAL HIGH (ref 0.44–1.00)
GFR calc Af Amer: 24 mL/min — ABNORMAL LOW (ref >60)
GFR calc non Af Amer: 21 mL/min — ABNORMAL LOW (ref >60)
Glucose, Bld: 111 mg/dL — ABNORMAL HIGH (ref 70–99)
Potassium: 4 mmol/L (ref 3.5–5.1)
Sodium: 135 mmol/L (ref 135–145)

## 2019-07-30 LAB — GLUCOSE, CAPILLARY: Glucose-Capillary: 100 mg/dL — ABNORMAL HIGH (ref 70–99)

## 2019-07-30 MED ORDER — METOPROLOL SUCCINATE ER 100 MG PO TB24: 200.0000 mg | ORAL_TABLET | Freq: Every day | ORAL | Status: DC

## 2019-07-30 MED ORDER — METOPROLOL SUCCINATE ER 200 MG PO TB24: 200.0000 mg | ORAL_TABLET | Freq: Every day | ORAL | 11 refills | Status: DC

## 2019-07-30 MED ORDER — FUROSEMIDE 40 MG PO TABS: 20.0000 mg | ORAL_TABLET | Freq: Every day | ORAL | 1 refills | Status: DC

## 2019-07-30 MED ORDER — METOPROLOL SUCCINATE ER 100 MG PO TB24
200.0000 mg | ORAL_TABLET | Freq: Every day | ORAL | Status: DC
  Administered 2019-07-30: 200 mg via ORAL
  Filled 2019-07-30: qty 2

## 2019-07-30 NOTE — Discharge Instructions (Signed)
CONTINUE wearing the LifeVest.

## 2019-07-30 NOTE — Discharge Summary (Addendum)
Discharge Summary    Patient ID: Terry Hanson MRN: [id number]; DOB: 1941/03/10  Admit date: 07/27/2019 Discharge date: 07/30/2019  Primary Care Provider: [name], MD  Primary Cardiologist: Lesleigh Noe, MD  Primary Electrophysiologist:  None   Discharge Diagnoses    Principal Problem:   Atrial fibrillation with RVR [facility]) Active Problems:   Chronic systolic CHF (congestive heart failure) (HCC)   CAD (coronary artery disease)   CKD (chronic kidney disease) stage 3, GFR 30-59 ml/min   Nausea and vomiting   Ventricular tachycardia (HCC)   Allergies Allergies  Allergen Reactions  . Codeine Hives  . Penicillins Hives  . Shrimp [Shellfish Allergy]     Broke out  . Sulfa Antibiotics Itching  . Codeine Itching and Rash  . Penicillins Itching and Rash    Did it involve swelling of the face/tongue/throat, SOB, or low BP? Yes Did it involve sudden or severe rash/hives, skin peeling, or any reaction on the inside of your mouth or nose? Yes Did you need to seek medical attention at a hospital or doctor's office? Yes When did it last happen? Over 10 years If all above answers are "NO", may proceed with cephalosporin use.   . Sulfa Antibiotics Itching and Rash    Diagnostic Studies/Procedures   X-ray results are below _____________   History of Present Illness     Terry Hanson is a 79 y.o. female with a history of anterior myocardial infarction April 2021 with PCI of the proximal LAD, ischemic cardiomyopathy (discharged with LifeVest), chronic stage III kidney disease, diabetes mellitus, hyperlipidemia, recently placed on amiodarone and apixaban for atrial fibrillation and ventricular tachycardia noted on LifeVest admitted 5/10 with progressive weakness, nausea/vomiting and diarrhea.  Hospital Course     Consultants: EP  There was concern that her symptoms were side effects from the amiodarone.  She was treated with as needed medications and IV fluids, her  symptoms improved.  She was felt dehydrated from the nausea, vomiting, and diarrhea in the setting of chronic diuretic use.  Her Lasix was held.  She was hypokalemic and her potassium was supplemented.    Her Metformin was held as well as the Lasix.  She is to continue holding the Metformin and follow-up with Dr. Janna Arch.  She was felt to have acute on chronic stage III CKD, creatinine 2.47 on admission.  It improved with holding the diuretics and hydration.  Creatinine 2.17 at discharge. Recheck as an outpatient.  Because of her history of recent MI, she was continued on Plavix and a statin.  She is not on aspirin because of anticoagulation for A. Fib.  Her atrial fibrillation was monitored carefully.  She was initially on heparin and the apixaban was held.  However, the apixaban was resumed prior to discharge.  Her beta-blocker was resumed but at a different formulation.  She was seen by Dr. Johney Frame to help manage her atrial fibrillation.  Tikosyn is not an option for her because of decreased renal function and many other medications are not an option because of her CAD.  Her GI symptoms improved off the amiodarone and she will follow up in the A. fib clinic as arranged.  She has an ischemic cardiomyopathy, so her Lopressor was changed to Toprol-XL, no dose change.  She is not on ARB/Entresto or hydralazine/nitrates due to low blood pressure.  Add as an outpatient if blood pressure will allow.  On 07/30/2019, she was seen by Dr. Jens Som and all data were reviewed.  Systolic blood pressure was in the 90s, but she was tolerating this well. She had some brief nonsustained VT on her telemetry, but was asymptomatic with it.  She is to continue wearing the LifeVest at discharge.  No further inpatient work-up is indicated and she is considered stable for discharge, to follow-up as an outpatient.  Resume Home Health.  _____________  Discharge Vitals Blood pressure 104/62, pulse 83, temperature 98 F  (36.7 C), temperature source Oral, resp. rate 18, height 5\' 2"  (1.575 m), weight 90.6 kg, SpO2 98 %.  Filed Weights   07/28/19 0417 07/29/19 0428 07/30/19 0451  Weight: 88.7 kg 91 kg 90.6 kg    Labs & Radiologic Studies    CBC Recent Labs    07/27/19 1701 07/28/19 0413  WBC 11.6* 9.1  NEUTROABS 8.8*  --   HGB 11.4* 10.5*  HCT 35.2* 32.5*  MCV 92.4 94.8  PLT 355 284   Basic Metabolic Panel Recent Labs    09/27/19 0501 07/30/19 0448  NA 134* 135  K 3.8 4.0  CL 103 103  CO2 19* 24  GLUCOSE 131* 111*  BUN 29* 29*  CREATININE 2.25* 2.17*  CALCIUM 8.6* 8.9   Liver Function Tests Recent Labs    07/27/19 1701  AST 31  ALT 35  ALKPHOS 40  BILITOT 1.0  PROT 7.6  ALBUMIN 3.3*   Thyroid Function Tests Recent Labs    07/27/19 1701  TSH 5.430*   _____________  DG Chest Port 1 View  Result Date: 07/08/2019 CLINICAL DATA:  Shortness of breath. Recent myocardial infarction. EXAM: PORTABLE CHEST 1 VIEW COMPARISON:  Radiograph 06/29/2019 FINDINGS: Overlying artifact from life vest/monitoring device. Low lung volumes. Mild cardiomegaly. Prior perihilar opacities have near completely resolved likely pulmonary edema. No pleural fluid, pneumothorax, or confluent airspace disease. IMPRESSION: Mild cardiomegaly. Resolved pulmonary edema from exam earlier this month. Electronically Signed   By: 08/29/2019 M.D.   On: 07/08/2019 02:42   Disposition   Pt is being discharged home today in improved condition.  Follow-up Plans & Appointments    Follow-up Information    [facility] Follow up on 08/06/2019.   Specialty: Cardiology Why: at 1130 for post hospital follow up. Pt entrance C. Can use valet, or underground parking on your right. Code for parking for May is 5008 Contact information: [address] [id number] [place] [phone]       [facility] Follow up.   Why: Home health Physical  Therapy-office to call with an appointment for visits.  Contact information: [address]   [place], [place]  [phone]       [phone], MD Follow up.   Specialty: Cardiology Why: The office will call. Contact information: [address] [place] [phone]        [phone], MD. Schedule an appointment as soon as possible for a visit.   Specialty: Internal Medicine Why: See for diabetes management within 2 weeks.  Hold Metformin until you see him. Contact information: [address] [place] [phone]          Discharge Instructions    (HEART FAILURE PATIENTS) Call MD:  Anytime you have any of the following symptoms: 1) 3 pound weight gain in 24 hours or 5 pounds in 1 week 2) shortness of breath, with or without a dry hacking cough 3) swelling in the hands, feet or stomach 4)  if you have to sleep on extra pillows at night in order to breathe.   Complete by: As directed    Diet - low sodium heart healthy   Complete by: As directed    Diet Carb Modified   Complete by: As directed    Increase activity slowly   Complete by: As directed       Discharge Medications   Allergies as of 07/30/2019      Reactions   Codeine Hives   Penicillins Hives   Shrimp [shellfish Allergy]    Broke out   Sulfa Antibiotics Itching   Codeine Itching, Rash   Penicillins Itching, Rash   Did it involve swelling of the face/tongue/throat, SOB, or low BP? Yes Did it involve sudden or severe rash/hives, skin peeling, or any reaction on the inside of your mouth or nose? Yes Did you need to seek medical attention at a hospital or doctor's office? Yes When did it last happen? Over 10 years If all above answers are "NO", may proceed with cephalosporin use.   Sulfa Antibiotics Itching, Rash      Medication List    STOP taking these medications   carvedilol 3.125 MG tablet Commonly known as: COREG     metFORMIN 500 MG tablet Commonly known as: GLUCOPHAGE   potassium chloride SA 20 MEQ tablet Commonly known as: KLOR-CON     TAKE these medications   apixaban 5 MG Tabs tablet Commonly known as: ELIQUIS Take 1 tablet (5 mg total) by mouth 2 (two) times daily.   clopidogrel 75 MG tablet Commonly known as: PLAVIX Take 75 mg by mouth daily.   furosemide 40 MG tablet Commonly known as: LASIX Take 0.5 tablets (20 mg total) by mouth daily. Start taking on: Aug 01, 2019 What changed:   how much to take  These instructions start on Aug 01, 2019. If you are unsure what to do until then, ask your doctor or other care provider.   metoprolol 200 MG 24 hr tablet Commonly known as: TOPROL-XL Take 1 tablet (200 mg total) by mouth daily. Take with or immediately following a meal. Start taking on: Jul 31, 2019   nitroGLYCERIN 0.4 MG SL tablet Commonly known as: Nitrostat Place 1 tablet (0.4 mg total) under the tongue every 5 (five) minutes as needed. What changed: reasons to take this   rosuvastatin 40 MG tablet Commonly known as: CRESTOR Take 1 tablet (40 mg total) by mouth daily.   vitamin B-12 500 MCG tablet Commonly known as: CYANOCOBALAMIN Take 1 tablet (500 mcg total) by mouth daily.          Outstanding Labs/Studies   None  Duration of Discharge Encounter   Greater than 30 minutes including physician time.  Signed, Rosaria Ferries, PA-C 07/30/2019, 11:57 AM

## 2019-07-30 NOTE — Telephone Encounter (Signed)
New message  Per Theodore Demark Pa scheduled hospital follow up [facility] visit with Norma Fredrickson NP on 08/04/19 at 9:15 am.

## 2019-07-30 NOTE — Progress Notes (Signed)
Progress Note  Patient Name: Terry Hanson Date of Encounter: 07/30/2019  Primary Cardiologist: Lesleigh Noe, MD   Subjective   No CP, dyspnea or nausea  Inpatient Medications    Scheduled Meds: . apixaban  5 mg Oral BID  . clopidogrel  75 mg Oral Daily  . insulin aspart  0-15 Units Subcutaneous TID WC  . metoprolol tartrate  100 mg Oral BID  . rosuvastatin  40 mg Oral Daily  . vitamin B-12  500 mcg Oral Daily   Continuous Infusions:  PRN Meds: acetaminophen, albuterol, melatonin, nitroGLYCERIN, ondansetron (ZOFRAN) IV, traZODone   Vital Signs    Vitals:   07/29/19 2027 07/30/19 0029 07/30/19 0451 07/30/19 0803  BP: (!) 90/51 (!) 97/48 (!) 97/58 98/63  Pulse: 100 84 87 91  Resp: 17 16 19 20   Temp: 98.4 F (36.9 C) 98.1 F (36.7 C) 98 F (36.7 C) 97.8 F (36.6 C)  TempSrc: Oral Oral Oral Oral  SpO2: 99% 99% 100% 98%  Weight:   90.6 kg     Intake/Output Summary (Last 24 hours) at 07/30/2019 0827 Last data filed at 07/29/2019 2200 Gross per 24 hour  Intake 970 ml  Output -  Net 970 ml   Last 3 Weights 07/30/2019 07/29/2019 07/28/2019  Weight (lbs) 199 lb 12.8 oz 200 lb 11.2 oz 195 lb 8.8 oz  Weight (kg) 90.629 kg 91.037 kg 88.7 kg      Telemetry    Atrial fibrillation with 5 beats NSVT; rate controlled  - Personally Reviewed  Physical Exam   GEN: NAD WD Neck: supple Cardiac: irregular Respiratory: CTA; no wheeze GI: Soft, NT/ND, no masses MS: No edema Neuro:  No focal findings Psych: Normal affect   Labs    High Sensitivity Troponin:   Recent Labs  Lab 07/08/19 0204 07/08/19 0524  TROPONINIHS 2,827* 2,362*      Chemistry Recent Labs  Lab 07/27/19 1701 07/28/19 0413 07/28/19 1057 07/29/19 0501 07/30/19 0448  NA 136   < > 134* 134* 135  K 3.5   < > 4.1 3.8 4.0  CL 97*   < > 102 103 103  CO2 25   < > 21* 19* 24  GLUCOSE 147*   < > 146* 131* 111*  BUN 35*   < > 33* 29* 29*  CREATININE 2.47*   < > 2.15* 2.25* 2.17*  CALCIUM 9.5    < > 8.6* 8.6* 8.9  PROT 7.6  --   --   --   --   ALBUMIN 3.3*  --   --   --   --   AST 31  --   --   --   --   ALT 35  --   --   --   --   ALKPHOS 40  --   --   --   --   BILITOT 1.0  --   --   --   --   GFRNONAA 18*   < > 21* 20* 21*  GFRAA 21*   < > 25* 23* 24*  ANIONGAP 14   < > 11 12 8    < > = values in this interval not displayed.     Hematology Recent Labs  Lab 07/27/19 1701 07/28/19 0413  WBC 11.6* 9.1  RBC 3.81* 3.43*  HGB 11.4* 10.5*  HCT 35.2* 32.5*  MCV 92.4 94.8  MCH 29.9 30.6  MCHC 32.4 32.3  RDW 13.5 13.6  PLT  355 284    Patient Profile     79 y.o. female status post anterior myocardial infarction April 2021 with PCI of the proximal LAD, ischemic cardiomyopathy (discharged with LifeVest), chronic stage III kidney disease, diabetes mellitus, hyperlipidemia, recently placed on amiodarone and apixaban for atrial fibrillation and ventricular tachycardia noted on LifeVest admitted with progressive weakness, nausea/vomiting and diarrhea.  Assessment & Plan    1 nausea-resolved; likely secondary to amiodarone.  This medication has been discontinued and she will remain off.  2 acute on chronic stage III kidney disease-renal function continues to improve.  This was felt secondary to nausea with decreased p.o. intake/dehydration.  We will hold Lasix today and tomorrow.  Will resume 20 mg daily on Saturday.  3 coronary artery disease status post recent anterior infarct with PCI of LAD-continue Plavix and statin.  Her aspirin has been discontinued as she will require anticoagulation for atrial fibrillation.  4 persistent atrial fibrillation-amiodarone has been discontinued because of nausea.  Plan is for rate control for now.  Change metoprolol to Toprol 200 mg daily.  Continue apixaban 5 mg twice daily.  Follow-up in atrial fibrillation clinic after discharge.  May consider Tikosyn in the future once amiodarone washes out and renal function improves.  5 ischemic  cardiomyopathy-change metoprolol to Toprol 100 mg twice daily.  Will not add ARB, Entresto or hydralazine/nitrates at this point as blood pressure is borderline.  Will consider as an outpatient if blood pressure allows.  6 ventricular tachycardia-5 beats noted on telemetry.  We will continue LifeVest at discharge given recent myocardial infarction and reduced LV function.  7 hypokalemia-resolved.  Discharge today.  Check potassium and renal function on Monday.  Follow-up APP 1 week.  Follow-up Dr. Tamala Julian 8 weeks. > CHF30 min PA and physician time D2 and  For questions or updates, please contact [facility] [facility] Please consult [email or web address] for contact info under        Signed, Kirk Ruths, MD  07/30/2019, 8:27 AM

## 2019-07-30 NOTE — Care Management (Signed)
1015 07-30-19 Patient presented for Atrial Fib- prior to arrival from home and active with [facility] for Physical Therapy services. Resumption orders to be submitted in Epic.Gala Lewandowsky, RN,BSN Case Manager

## 2019-07-31 NOTE — Telephone Encounter (Signed)
Patient contacted regarding discharge from [facility] on 08/04/2019.  Patient understands to follow up with provider Shawn Route, NP on 08/04/2019 at 9:15 at [address] in [place], [place] [address]. Patient understands discharge instructions? Yes Patient understands medications and regiment? Yes Patient understands to bring all medications to this visit? Yes  Ask patient:  Are you enrolled in My Chart: No and is not interested

## 2019-07-31 NOTE — Progress Notes (Signed)
CARDIOLOGY OFFICE NOTE  Date:  08/04/2019    Terry Hanson Date of Birth: 09-02-1940 Medical Record #[id number]  PCP:  [name], MD  Cardiologist:  Katrinka Blazing    Chief Complaint  Patient presents with  . Follow-up    History of Present Illness: Terry Hanson is a 79 y.o. female who presents today for a post hospital visit/TOC visit. Seen for Dr. Katrinka Blazing.   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  There was concern that her symptoms were side effects from the amiodarone.  She was treated with as needed medications and IV fluids, her symptoms improved.   Her Lasix was held.  She was hypokalemic and her potassium was supplemented.  Because of her history of recent MI, she was continued on Plavix and a statin.  She is not on aspirin because of anticoagulation for A. Fib.  She was seen by Dr. Johney Frame to help manage her atrial fibrillation.  Tikosyn is not an option for her because of decreased renal function and many other medications are not an option because of her CAD.  Her GI symptoms improved off the amiodarone and she is to follow up in the A. fib clinic as arranged. She has an ischemic cardiomyopathy, so her Lopressor was changed to Toprol-XL, no dose change.  She is not on ARB/Entresto or hydralazine/nitrates due to low blood pressure. To add as an outpatient if blood pressure will allow.  The patient does not have symptoms concerning for COVID-19 infection (fever, chills, cough, or new shortness of breath).   Comes in today. Here with her daughter. Lots of concerns and lots of issues. Remains short  of breath. Asking about her kidney function and why this is up/where it came from, etc. She is off her Metformin still. Her PCP is currently out of town. She is back in AF - looks like she left the hospital in April in [place].  She remains on Eliquis and Plavix. She is anemic. She notes that she weighed around 220 before getting sick earlier this year. Pretty sedentary as a general rule. Has her Life Vest in place. No chest pain. Struggling with salt restriction - family has made lots of changes - this has just frustrated her.   Past Medical History:  Diagnosis Date  . Arthritis   . Asthma   . Diabetes mellitus (HCC)   . Diabetes mellitus without complication (HCC)   . Enlarged heart   . Hypercholesterolemia   . Hyperlipidemia   . Ischemic cardiomyopathy    a. EF 30-35% by echo in 06/2019  . STEMI (ST elevation myocardial infarction) (HCC)    a. 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    Past Surgical History:  Procedure Laterality Date  . ABDOMINAL HYSTERECTOMY    . CORONARY/GRAFT ACUTE MI REVASCULARIZATION N/A 06/29/2019   Procedure: Coronary/Graft Acute MI Revascularization;  Surgeon: Lyn Records, MD;  Location: [facility] INVASIVE CV LAB;  Service: Cardiovascular;  Laterality: N/A;  . LEFT HEART CATH AND CORONARY ANGIOGRAPHY N/A 06/29/2019   Procedure: LEFT HEART CATH AND CORONARY ANGIOGRAPHY;  Surgeon: Lyn Records, MD;  Location: [facility] INVASIVE CV LAB;  Service: Cardiovascular;  Laterality:  N/A;     Medications: Current Meds  Medication Sig  . apixaban (ELIQUIS) 5 MG TABS tablet Take 1 tablet (5 mg total) by mouth 2 (two) times daily.  . clopidogrel (PLAVIX) 75 MG tablet Take 75 mg by mouth daily.  . furosemide (LASIX) 40 MG tablet Take 0.5 tablets (20 mg total) by mouth daily.  . metoprolol succinate (TOPROL-XL) 200 MG 24 hr tablet Take 1 tablet (200 mg total) by mouth daily. Take with or immediately following a meal.  . nitroGLYCERIN (NITROSTAT) 0.4 MG SL tablet Place 1 tablet  (0.4 mg total) under the tongue every 5 (five) minutes as needed.  . rosuvastatin (CRESTOR) 40 MG tablet Take 1 tablet (40 mg total) by mouth daily.  . vitamin B-12 (CYANOCOBALAMIN) 500 MCG tablet Take 1 tablet (500 mcg total) by mouth daily.     Allergies: Allergies  Allergen Reactions  . Codeine Hives  . Penicillins Hives  . Shrimp [Shellfish Allergy]     Broke out  . Sulfa Antibiotics Itching  . Codeine Itching and Rash  . Penicillins Itching and Rash    Did it involve swelling of the face/tongue/throat, SOB, or low BP? Yes Did it involve sudden or severe rash/hives, skin peeling, or any reaction on the inside of your mouth or nose? Yes Did you need to seek medical attention at a hospital or doctor's office? Yes When did it last happen? Over 10 years If all above answers are "NO", may proceed with cephalosporin use.   . Sulfa Antibiotics Itching and Rash    Social History: The patient  reports that she has never smoked. She has never used smokeless tobacco. She reports previous alcohol use. She reports that she does not use drugs.   Family History: The patient's family history includes Heart disease in her mother.   Review of Systems: Please see the history of present illness.   All other systems are reviewed and negative.   Physical Exam: VS:  BP 100/74   Pulse (!) 115   Ht 5\' 2"  (1.575 m)   Wt 199 lb 12.8 oz (90.6 kg)   SpO2 98%   BMI 36.54 kg/m  .  BMI Body mass index is 36.54 kg/m.  Wt Readings from Last 3 Encounters:  08/04/19 199 lb 12.8 oz (90.6 kg)  07/30/19 199 lb 12.8 oz (90.6 kg)  07/27/19 199 lb (90.3 kg)   BP is 92/60 by me with a large cuff.   General: Alert and in no acute distress. She is of short stature. She is obese. Weight unchanged over last 10 days.   Neck: Thick.  Cardiac: Irregular irregular rhythm. Rate is elevated. Trace edema.  Respiratory:  Lungs are fairly clear to auscultation bilaterally with normal work of breathing.  GI:  Obese.  MS: No deformity or atrophy. Gait and ROM intact. Using a walker.  Skin: Warm and dry. Color is pale. Neuro:  Strength and sensation are intact and no gross focal deficits noted.  Psych: Alert, appropriate and with normal affect.   LABORATORY DATA:  EKG:  EKG is ordered today.  Personally reviewed by me. This demonstrates AF with elevated VR - HR is 115.  Lab Results  Component Value Date   WBC 9.1 07/28/2019   HGB 10.5 (L) 07/28/2019   HCT 32.5 (L) 07/28/2019   PLT 284 07/28/2019   GLUCOSE 111 (H) 07/30/2019   CHOL 266 (H) 06/29/2019   TRIG 117 06/29/2019   HDL 51 06/29/2019   LDLCALC 192 (  H) 06/29/2019   ALT 35 07/27/2019   AST 31 07/27/2019   NA 135 07/30/2019   K 4.0 07/30/2019   CL 103 07/30/2019   CREATININE 2.17 (H) 07/30/2019   BUN 29 (H) 07/30/2019   CO2 24 07/30/2019   TSH 5.430 (H) 07/27/2019   INR 2.2 (H) 07/27/2019   HGBA1C 6.5 (H) 07/01/2019     BNP (last 3 results) Recent Labs    06/29/19 2218 07/08/19 0204  BNP 37.2 1,020.0*    ProBNP (last 3 results) Recent Labs    07/15/19 0950  PROBNP 11,777*     Other Studies Reviewed Today:  ECHO IMPRESSIONS 06/2019  1. Left ventricular ejection fraction, by estimation, is 30 to 35%. The  left ventricle has moderately decreased function. The left ventricle  demonstrates regional wall motion abnormalities (see scoring  diagram/findings for description). There is  moderate left ventricular hypertrophy. Left ventricular diastolic  parameters are consistent with Grade I diastolic dysfunction (impaired  relaxation). There is mild dyskinesis of the left ventricular, apex. There  is severe hypokinesis of the left  ventricular, mid-apical anterior wall, anteroseptal wall, inferior wall  and inferoseptal wall. No intracavitary thrombus is seen, but images are  suboptimal.  2. Right ventricular systolic function is normal. The right ventricular  size is normal. There is mildly elevated pulmonary  artery systolic  pressure. The estimated right ventricular systolic pressure is 36.2 mmHg.  3. Left atrial size was mildly dilated.  4. The mitral valve is normal in structure. No evidence of mitral valve  regurgitation.  5. Aortic valve gradients are underestimated due to poor left ventricular  systolic function. The aortic valve is tricuspid. Aortic valve  regurgitation is not visualized. Mild aortic valve stenosis.  6. The inferior vena cava is dilated in size with <50% respiratory  variability, suggesting right atrial pressure of 15 mmHg.    Coronary/Graft Acute MI Revascularization 06/2019  LEFT HEART CATH AND CORONARY ANGIOGRAPHY  Conclusion   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  99% stenosis/thrombotic with TIMI I flow reduced to 0% with TIMI grade III flow following stent implantation using a 32 x 2.5 mm diameter onyx postdilated to 3.0 mm throughout the proximal two thirds of the stent.  The LAD is otherwise widely patent.  Less than 20% proximal left main  Small first obtuse marginal with proximal 70% narrowing.  Mid to distal circumflex with 60% narrowing.  Right coronary arises somewhat anterior.  There is 40 to 50% proximal/ostial narrowing.  There is 70% hazy eccentric mid RCA stenosis.  LVEDP 40, consistent with acute systolic heart failure.  Prior status of the left ventricle was not known.  RECOMMENDATIONS:   Management of acute presumed systolic heart failure: BiPAP; IV Lasix; Foley catheter; IV nitroglycerin 10-100 mics to keep systolic blood pressure 110 to 120 mmHg.  High intensity statin therapy  Once volume overload has resolved, beta-blocker and angiotensin receptor/ACE/Arni therapy should be considered  2D Doppler echocardiogram to assess LV function in a.m.  Medical therapy of right coronary and circumflex disease.  Aggressive risk factor modification.     Assessment/Plan: 1. Prior anterior MI  with PCI to the LAD - she is on Plavix solely.   2. Acute on chronic systolic HF - EF of 30 to 35% - not on ideal therapy due to soft BP and CKD - very challenging situation with tenuous prognosis. Needs salt restriction. Lab today.   3. CKD - progressive since what  looks to have been over the past year - normal levels in March of 2020 noted. Rechecking lab today. This has limited her options for medicines considerably.   4. AF - rate is not ideal - she is on 200 mg of Toprol. Going to AF clinic on Thursday - options are very limited and unclear at this time - unfortunately, this just worsens her overall situation with her CHF.   5. Anemia - unclear etiology - remains on Plavix and Eliquis - rechecking lab today.   6. DM - per PCP - remains off Metformin - last A1C was reassuring.   7. Obesity - I do not see this changing.   8. Prior amiodarone therapy - did not tolerate - needs recheck of her TSH today.   9. VT - off amiodarone - on high dose Toprol - Life Vest in place.   10. COVID-19 Education: The signs and symptoms of COVID-19 were discussed with the patient and how to seek care for testing (follow up with PCP or arrange E-visit).  The importance of social distancing, staying at home, hand hygiene and wearing a mask when out in public were discussed today.  Current medicines are reviewed with the patient today.  The patient does not have concerns regarding medicines other than what has been noted above.  The following changes have been made:  See above.  Labs/ tests ordered today include:    Orders Placed This Encounter  Procedures  . Basic metabolic panel  . CBC  . Pro b natriuretic peptide (BNP)  . TSH  . EKG 12-Lead     Disposition:   FU with [place] in about 10 days. Labs today. Going to AF clinic later this week. I think her overall prognosis is quite tenuous - she is at high risk for re-admission - has very limited options going forward.    Patient is agreeable to this  plan and will call if any problems develop in the interim.   SignedTruitt Merle, NP  08/04/2019 9:49 AM  [facility] [address] [place], [place]  [address] Phone: [phone] Fax: [phone]

## 2019-08-04 ENCOUNTER — Other Ambulatory Visit: Payer: Self-pay

## 2019-08-04 ENCOUNTER — Encounter: Payer: Self-pay | Admitting: Nurse Practitioner

## 2019-08-04 ENCOUNTER — Other Ambulatory Visit: Payer: Medicare Other

## 2019-08-04 ENCOUNTER — Ambulatory Visit (INDEPENDENT_AMBULATORY_CARE_PROVIDER_SITE_OTHER): Payer: Medicare Other | Admitting: Nurse Practitioner

## 2019-08-04 VITALS — BP 100/74 | HR 115 | Ht 62.0 in | Wt 199.8 lb

## 2019-08-04 DIAGNOSIS — I472 Ventricular tachycardia, unspecified: Secondary | ICD-10-CM

## 2019-08-04 DIAGNOSIS — Z79899 Other long term (current) drug therapy: Secondary | ICD-10-CM

## 2019-08-04 DIAGNOSIS — I4891 Unspecified atrial fibrillation: Secondary | ICD-10-CM | POA: Diagnosis not present

## 2019-08-04 NOTE — Patient Instructions (Addendum)
After Visit Summary:  We will be checking the following labs today - BMET CBC, TSH and BNP   Medication Instructions:    Continue with your current medicines for now.    If you need a refill on your cardiac medications before your next appointment, please call your pharmacy.     Testing/Procedures To Be Arranged:  N/A  Follow-Up:   See [place] in about 10 days with EKG    At [facility], you and your health needs are our priority.  As part of our continuing mission to provide you with exceptional heart care, we have created designated Provider Care Teams.  These Care Teams include your primary Cardiologist (physician) and Advanced Practice Providers (APPs -  Physician Assistants and Nurse Practitioners) who all work together to provide you with the care you need, when you need it.  Special Instructions:  . Stay safe, stay home, wash your hands for at least 20 seconds and wear a mask when out in public.  . It was good to talk with you both today.  . Call your primary care provider about the Metformin and your diabetes. . Would not go to cardiac rehab at this time.   . Continue to restrict your salt and weigh daily.    Call the [facility] office at [phone] if you have any questions, problems or concerns.

## 2019-08-05 ENCOUNTER — Other Ambulatory Visit: Payer: Self-pay | Admitting: *Deleted

## 2019-08-05 ENCOUNTER — Ambulatory Visit (HOSPITAL_COMMUNITY): Payer: Medicare Other | Admitting: Physician Assistant

## 2019-08-05 ENCOUNTER — Encounter (HOSPITAL_COMMUNITY): Payer: Medicare Other

## 2019-08-05 DIAGNOSIS — N183 Chronic kidney disease, stage 3 unspecified: Secondary | ICD-10-CM

## 2019-08-05 DIAGNOSIS — I4891 Unspecified atrial fibrillation: Secondary | ICD-10-CM

## 2019-08-05 DIAGNOSIS — Z79899 Other long term (current) drug therapy: Secondary | ICD-10-CM

## 2019-08-05 DIAGNOSIS — R7989 Other specified abnormal findings of blood chemistry: Secondary | ICD-10-CM

## 2019-08-05 LAB — CBC
Hematocrit: 33.1 % — ABNORMAL LOW (ref 34.0–46.6)
Hemoglobin: 10.9 g/dL — ABNORMAL LOW (ref 11.1–15.9)
MCH: 30.3 pg (ref 26.6–33.0)
MCHC: 32.9 g/dL (ref 31.5–35.7)
MCV: 92 fL (ref 79–97)
Platelets: 343 10*3/uL (ref 150–450)
RBC: 3.6 x10E6/uL — ABNORMAL LOW (ref 3.77–5.28)
RDW: 13.7 % (ref 11.7–15.4)
WBC: 9.7 10*3/uL (ref 3.4–10.8)

## 2019-08-05 LAB — BASIC METABOLIC PANEL
BUN/Creatinine Ratio: 17 (ref 12–28)
BUN: 28 mg/dL — ABNORMAL HIGH (ref 8–27)
CO2: 24 mmol/L (ref 20–29)
Calcium: 9 mg/dL (ref 8.7–10.3)
Chloride: 97 mmol/L (ref 96–106)
Creatinine, Ser: 1.63 mg/dL — ABNORMAL HIGH (ref 0.57–1.00)
GFR calc Af Amer: 35 mL/min/{1.73_m2} — ABNORMAL LOW (ref >59)
GFR calc non Af Amer: 30 mL/min/{1.73_m2} — ABNORMAL LOW (ref >59)
Glucose: 133 mg/dL — ABNORMAL HIGH (ref 65–99)
Potassium: 3.8 mmol/L (ref 3.5–5.2)
Sodium: 138 mmol/L (ref 134–144)

## 2019-08-05 LAB — PRO B NATRIURETIC PEPTIDE: NT-Pro BNP: 19944 pg/mL — ABNORMAL HIGH (ref 0–738)

## 2019-08-05 LAB — TSH: TSH: 8.93 u[IU]/mL — ABNORMAL HIGH (ref 0.450–4.500)

## 2019-08-05 MED ORDER — LEVOTHYROXINE SODIUM 25 MCG PO TABS: 25.0000 ug | ORAL_TABLET | Freq: Every day | ORAL | 2 refills | Status: DC

## 2019-08-05 MED ORDER — POTASSIUM CHLORIDE CRYS ER 20 MEQ PO TBCR
20.0000 meq | EXTENDED_RELEASE_TABLET | Freq: Every day | ORAL | 2 refills | Status: DC
Start: 2019-08-05 — End: 2019-12-08

## 2019-08-05 MED ORDER — FUROSEMIDE 40 MG PO TABS: 60.0000 mg | ORAL_TABLET | Freq: Every day | ORAL | 1 refills | Status: DC

## 2019-08-06 ENCOUNTER — Ambulatory Visit (HOSPITAL_COMMUNITY)
Admission: RE | Admit: 2019-08-06 | Discharge: 2019-08-06 | Disposition: A | Discharge status: Home / Self Care | Payer: Medicare Other | Source: Ambulatory Visit | Attending: Nurse Practitioner | Admitting: Nurse Practitioner

## 2019-08-06 ENCOUNTER — Encounter (HOSPITAL_COMMUNITY): Payer: Self-pay | Admitting: Nurse Practitioner

## 2019-08-06 ENCOUNTER — Other Ambulatory Visit: Payer: Self-pay

## 2019-08-06 VITALS — BP 112/68 | HR 127 | Ht 62.0 in | Wt 197.8 lb

## 2019-08-06 DIAGNOSIS — Z79890 Hormone replacement therapy (postmenopausal): Secondary | ICD-10-CM | POA: Insufficient documentation

## 2019-08-06 DIAGNOSIS — I252 Old myocardial infarction: Secondary | ICD-10-CM | POA: Diagnosis present

## 2019-08-06 DIAGNOSIS — Z7902 Long term (current) use of antithrombotics/antiplatelets: Secondary | ICD-10-CM | POA: Insufficient documentation

## 2019-08-06 DIAGNOSIS — N183 Chronic kidney disease, stage 3 unspecified: Secondary | ICD-10-CM | POA: Diagnosis not present

## 2019-08-06 DIAGNOSIS — Z7901 Long term (current) use of anticoagulants: Secondary | ICD-10-CM | POA: Insufficient documentation

## 2019-08-06 DIAGNOSIS — Z880 Allergy status to penicillin: Secondary | ICD-10-CM | POA: Insufficient documentation

## 2019-08-06 DIAGNOSIS — I4891 Unspecified atrial fibrillation: Secondary | ICD-10-CM | POA: Insufficient documentation

## 2019-08-06 DIAGNOSIS — Z79899 Other long term (current) drug therapy: Secondary | ICD-10-CM | POA: Insufficient documentation

## 2019-08-06 DIAGNOSIS — Z882 Allergy status to sulfonamides status: Secondary | ICD-10-CM | POA: Insufficient documentation

## 2019-08-06 DIAGNOSIS — I255 Ischemic cardiomyopathy: Secondary | ICD-10-CM | POA: Insufficient documentation

## 2019-08-06 DIAGNOSIS — D6869 Other thrombophilia: Secondary | ICD-10-CM

## 2019-08-06 DIAGNOSIS — Z8249 Family history of ischemic heart disease and other diseases of the circulatory system: Secondary | ICD-10-CM | POA: Insufficient documentation

## 2019-08-06 DIAGNOSIS — Z885 Allergy status to narcotic agent status: Secondary | ICD-10-CM | POA: Insufficient documentation

## 2019-08-06 DIAGNOSIS — E1122 Type 2 diabetes mellitus with diabetic chronic kidney disease: Secondary | ICD-10-CM | POA: Diagnosis not present

## 2019-08-06 DIAGNOSIS — E7800 Pure hypercholesterolemia, unspecified: Secondary | ICD-10-CM | POA: Insufficient documentation

## 2019-08-06 DIAGNOSIS — E785 Hyperlipidemia, unspecified: Secondary | ICD-10-CM | POA: Diagnosis not present

## 2019-08-06 NOTE — Progress Notes (Addendum)
Primary Care Physician: Lucia Gaskins, MD Referring Physician: Dr. Rayann Heman, post [facility]  F/u Cardiologist: Dr. Linard Millers   Terry Hanson is a 79 y.o. 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 CHA2DS2VASc score is at lest 6.  There were concerns  that her symptoms were side effects from the amiodarone.  She was treated with as needed medications and IV fluids, her symptoms improved, with amiodarone was eventually stopped.  She was hypokalemic and her potassium was supplemented.    She was felt to have acute on chronic stage III CKD, creatinine 2.47 on admission.  It improved with holding the diuretics and hydration.  Creatinine 2.17 at discharge.   Her atrial fibrillation was monitored carefully.  She was initially on heparin and the apixaban was held.  However, the apixaban was resumed prior to discharge.  Her beta-blocker was resumed but at a different formulation of Toprol xl 200 mg daily. Dr. Rayann Heman saw pt and thought that rate control was best approach  and would avoid AAD. He said could revisit once chronically anticoagulated and kidney function had stabilized.  In the afib clinic, 08/06/19, afib control is not optimal. Unfortunately, she is on max BB, not an candidate for CCB. Despite this, she is feeling pretty good. She does her activities around the house without many  symptoms. She saw Truitt Merle on 5/18 and had her lasix increased. She continues to war the life vest. Getting tired of  running to so many doctors. She is  pending repeat bmet 5/26.    Today, she denies symptoms of palpitations, chest pain, shortness of breath, orthopnea, PND,  lower extremity edema, dizziness, presyncope, syncope, or neurologic sequela. The patient is tolerating medications without difficulties and is otherwise without complaint today.   Past Medical History:  Diagnosis Date  . Arthritis   . Asthma   . Diabetes mellitus ([name])   . Diabetes mellitus without complication ([name])   . Enlarged heart   . Hypercholesterolemia   . Hyperlipidemia   . Ischemic cardiomyopathy    a. EF 30-35% by echo in 06/2019  . STEMI (ST elevation myocardial infarction) ([name])    a. 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   Past Surgical History:  Procedure Laterality Date  . ABDOMINAL HYSTERECTOMY    . CORONARY/GRAFT ACUTE MI REVASCULARIZATION N/A 06/29/2019   Procedure: Coronary/Graft Acute MI Revascularization;  Surgeon: Belva Crome, MD;  Location: [place] CV LAB;  Service: Cardiovascular;  Laterality: N/A;  . LEFT HEART CATH AND CORONARY ANGIOGRAPHY N/A 06/29/2019   Procedure: LEFT HEART CATH AND CORONARY ANGIOGRAPHY;  Surgeon: Belva Crome, MD;  Location: [place] CV LAB;  Service: Cardiovascular;  Laterality: N/A;    Current Outpatient Medications  Medication Sig Dispense Refill  . apixaban (ELIQUIS) 5 MG TABS tablet Take 1 tablet (5 mg total) by mouth 2 (two) times daily. 60 tablet 3  . clopidogrel (PLAVIX) 75 MG tablet Take 75 mg by mouth daily.    . furosemide (LASIX) 40 MG tablet Take 1.5 tablets (60 mg total) by mouth daily. 270 tablet 1  . levothyroxine (SYNTHROID) 25 MCG tablet Take 1 tablet (25 mcg total) by mouth daily before breakfast. 90 tablet 2  . metoprolol succinate (TOPROL-XL)  200 MG 24 hr tablet Take 1 tablet (200 mg total) by mouth daily. Take with or immediately following a meal. 30 tablet 11  . nitroGLYCERIN (NITROSTAT) 0.4 MG SL tablet Place 1 tablet (0.4 mg total) under the tongue every 5 (five) minutes as needed. 25 tablet 2  . potassium chloride SA (KLOR-CON M20) 20 MEQ tablet Take 1 tablet (20 mEq total) by mouth daily.  90 tablet 2  . rosuvastatin (CRESTOR) 40 MG tablet Take 1 tablet (40 mg total) by mouth daily. 90 tablet 1  . vitamin B-12 (CYANOCOBALAMIN) 500 MCG tablet Take 1 tablet (500 mcg total) by mouth daily.     No current facility-administered medications for this encounter.    Allergies  Allergen Reactions  . Codeine Hives  . Penicillins Hives  . Shrimp [Shellfish Allergy]     Broke out  . Sulfa Antibiotics Itching  . Penicillins Itching and Rash    Did it involve swelling of the face/tongue/throat, SOB, or low BP? Yes Did it involve sudden or severe rash/hives, skin peeling, or any reaction on the inside of your mouth or nose? Yes Did you need to seek medical attention at a hospital or doctor's office? Yes When did it last happen? Over 10 years If all above answers are "NO", may proceed with cephalosporin use.     Social History   Socioeconomic History  . Marital status: Married    Spouse name: Not on file  . Number of children: Not on file  . Years of education: Not on file  . Highest education level: Not on file  Occupational History  . Not on file  Tobacco Use  . Smoking status: Never Smoker  . Smokeless tobacco: Former Neurosurgeon    Types: Chew  Substance and Sexual Activity  . Alcohol use: Not Currently  . Drug use: Never  . Sexual activity: Not on file  Other Topics Concern  . Not on file  Social History Narrative   ** Merged History Encounter **       Social Determinants of Health   Financial Resource Strain:   . Difficulty of Paying Living Expenses:   Food Insecurity:   . Worried About Programme researcher, broadcasting/film/video in the Last Year:   . Barista in the Last Year:   Transportation Needs:   . Freight forwarder (Medical):   Marland Kitchen Lack of Transportation (Non-Medical):   Physical Activity:   . Days of Exercise per Week:   . Minutes of Exercise per Session:   Stress:   . Feeling of Stress :   Social Connections:   . Frequency of Communication with Friends  and Family:   . Frequency of Social Gatherings with Friends and Family:   . Attends Religious Services:   . Active Member of Clubs or Organizations:   . Attends Banker Meetings:   Marland Kitchen Marital Status:   Intimate Partner Violence:   . Fear of Current or Ex-Partner:   . Emotionally Abused:   Marland Kitchen Physically Abused:   . Sexually Abused:     Family History  Problem Relation Age of Onset  . Heart disease Mother     ROS- All systems are reviewed and negative except as per the HPI above  Physical Exam: Vitals:   08/06/19 1130  BP: 112/68  Pulse: (!) 127  Weight: 89.7 kg  Height: 5\' 2"  (1.575 m)   Wt Readings from Last 3 Encounters:  08/06/19 89.7 kg  08/04/19 90.6 kg  07/30/19 90.6 kg    Labs: Lab Results  Component Value Date   NA 138 08/04/2019   K 3.8 08/04/2019   CL 97 08/04/2019   CO2 24 08/04/2019   GLUCOSE 133 (H) 08/04/2019   BUN 28 (H) 08/04/2019   CREATININE 1.63 (H) 08/04/2019   CALCIUM 9.0 08/04/2019   MG 2.4 07/10/2019   Lab Results  Component Value Date   INR 2.2 (H) 07/27/2019   Lab Results  Component Value Date   CHOL 266 (H) 06/29/2019   HDL 51 06/29/2019   LDLCALC 192 (H) 06/29/2019   TRIG 117 06/29/2019     GEN- The patient is well appearing, alert and oriented x 3 today.   Head- normocephalic, atraumatic Eyes-  Sclera clear, conjunctiva pink Ears- hearing intact Oropharynx- clear Neck- supple, no JVP Lymph- no cervical lymphadenopathy Lungs- Clear to ausculation bilaterally, normal work of breathing Heart- irregular rate and rhythm, no murmurs, rubs or gallops, PMI not laterally displaced GI- soft, NT, ND, + BS Extremities- no clubbing, cyanosis, or edema MS- no significant deformity or atrophy Skin- no rash or lesion Psych- euthymic mood, full affect Neuro- strength and sensation are intact  EKG-afib at 127 bpm, qrs int 88 ms, qtc 389 ms Pulse ox showed afib between 115-120 bpm   Assessment and Plan: 1. S/p  anterior MI with PCI to the LAD 06/2019 Continue  plavix  EF 30 to 35% Wearing Life vest   2. Acute on chronic HF Lasix increased to 60 mg daily on 5/18 Not on ideal medical management due to soft BP Not redistricting sodium Discussed in detail   3.CKD Worsening over the last year  Cr over 2.00 in hospital bmet peding 5/26  4. AFib Rate is not optimal On max BB Not a candidate for CCB Did not tolerate amiodarone  Discussed with Dr. Johney Frame and he had no further ideas to rate control at this time He advised to let her have more time to recuperate from recent hospitalization, loading on anticoagulation (started 5/5) and creatinine chance to improve Continue eliquis 5 mg bid for a CHA2DS2VASc of at least 6. Could consider Tikosyn down the road   F/u with Dr. Katrinka Blazing 6/3  [place]ina Sidle. Matthew Folks [facility] [address] [place], [place] [address] [phone]

## 2019-08-12 ENCOUNTER — Other Ambulatory Visit: Payer: Medicare Other

## 2019-08-18 NOTE — Progress Notes (Signed)
Cardiology Office Note:    Date:  08/20/2019   ID:  Terry Hanson, DOB 08/02/1940, MRN [id number]  PCP:  Terry Linsey, MD  Cardiologist:  Terry Noe, MD   Referring MD: Terry Linsey, MD   Chief Complaint  Patient presents with  . Congestive Heart Failure  . Atrial Fibrillation    History of Present Illness:    Terry Hanson is a 79 y.o. female with a hx of anterior myocardial infarction April 2021 with PCI of the proximal LAD - on Plavix solely, ischemic cardiomyopathy (discharged with LifeVest), chronic stage III kidney disease, diabetes mellitus II, hyperlipidemia,recently placed on amiodarone and apixaban for atrial fibrillation and ventricular tachycardia noted on LifeVest admitted5/1from the office with progressive weakness, nausea/vomiting and diarrhea that resolved after amiodarone stopped..  Recent AFIB Clinic visit without change in therapy.   She is having no angina.  She has fatigue on exertion.  There is not much shortness of breath at all at this point other than orthopnea.  She has not needed nitroglycerin.  No bleeding on the Plavix apixaban combination.  She is wearing a LifeVest.  No therapy from the device.    Past Medical History:  Diagnosis Date  . Arthritis   . Asthma   . Diabetes mellitus (HCC)   . Diabetes mellitus without complication (HCC)   . Enlarged heart   . Hypercholesterolemia   . Hyperlipidemia   . Ischemic cardiomyopathy    a. EF 30-35% by echo in 06/2019  . STEMI (ST elevation myocardial infarction) (HCC)    a. 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    Past Surgical History:  Procedure Laterality Date  . ABDOMINAL HYSTERECTOMY    . CORONARY/GRAFT ACUTE MI REVASCULARIZATION N/A 06/29/2019   Procedure: Coronary/Graft Acute MI Revascularization;  Surgeon: Lyn Records, MD;  Location: [facility] INVASIVE CV LAB;  Service: Cardiovascular;  Laterality: N/A;  . LEFT HEART CATH AND CORONARY ANGIOGRAPHY N/A 06/29/2019   Procedure: LEFT HEART CATH AND CORONARY ANGIOGRAPHY;  Surgeon: Lyn Records, MD;  Location: [facility] INVASIVE CV LAB;  Service: Cardiovascular;  Laterality: N/A;    Current Medications: Current Meds  Medication Sig  . apixaban (ELIQUIS) 5 MG TABS tablet Take 1 tablet (5 mg total) by mouth 2 (two) times daily.  . clopidogrel (PLAVIX) 75 MG tablet Take 75 mg by mouth daily.  . furosemide (LASIX) 40 MG tablet Take 1 tablet (40 mg total) by mouth daily.  Marland Kitchen levothyroxine (SYNTHROID) 25 MCG tablet Take 1 tablet (25 mcg total) by mouth daily before breakfast.  . metoprolol succinate (TOPROL-XL) 200 MG 24 hr tablet Take 1 tablet (200 mg total) by mouth daily. Take with or immediately following a meal.  . nitroGLYCERIN (NITROSTAT) 0.4 MG SL tablet Place 1 tablet (0.4 mg total) under the tongue every 5 (five) minutes as needed.  . potassium chloride SA (KLOR-CON M20) 20 MEQ tablet Take 1 tablet (20 mEq total) by mouth daily.  . rosuvastatin (CRESTOR) 40 MG tablet Take 1 tablet (40 mg total) by mouth daily.  . vitamin B-12 (CYANOCOBALAMIN) 500 MCG tablet Take 1 tablet (500 mcg total) by mouth daily.  . [DISCONTINUED] furosemide (LASIX) 40 MG tablet Take 1.5 tablets (60 mg total) by mouth daily.     Allergies:   Codeine, Penicillins, Shrimp [shellfish allergy], Sulfa antibiotics, and Penicillins   Social History   Socioeconomic History  . Marital status: Married    Spouse name: Not on file  . Number  of children: Not on file  . Years of education: Not on file  . Highest education level: Not on file  Occupational History  . Not on file  Tobacco Use  . Smoking status: Never Smoker  . Smokeless tobacco: Former Neurosurgeon    Types: Chew  Substance and Sexual Activity  . Alcohol use: Not Currently  . Drug use: Never  . Sexual activity: Not on file  Other Topics Concern  . Not on file  Social History Narrative   ** Merged History Encounter **       Social Determinants of Health   Financial Resource  Strain:   . Difficulty of Paying Living Expenses:   Food Insecurity:   . Worried About Programme researcher, broadcasting/film/video in the Last Year:   . Barista in the Last Year:   Transportation Needs:   . Freight forwarder (Medical):   Marland Kitchen Lack of Transportation (Non-Medical):   Physical Activity:   . Days of Exercise per Week:   . Minutes of Exercise per Session:   Stress:   . Feeling of Stress :   Social Connections:   . Frequency of Communication with Friends and Family:   . Frequency of Social Gatherings with Friends and Family:   . Attends Religious Services:   . Active Member of Clubs or Organizations:   . Attends Banker Meetings:   Marland Kitchen Marital Status:      Family History: The patient's family history includes Heart disease in her mother.  ROS:   Please see the history of present illness.    She is tired of wearing the LifeVest.  She has easy fatigability.  She has several pillow orthopnea although this was the case prior to her myocardial infarction.  All other systems reviewed and are negative.  EKGs/Labs/Other Studies Reviewed:    The following studies were reviewed today:  2D Doppler echocardiogram April 2021: IMPRESSIONS    1. Left ventricular ejection fraction, by estimation, is 30 to 35%. The  left ventricle has moderately decreased function. The left ventricle  demonstrates regional wall motion abnormalities (see scoring  diagram/findings for description). There is  moderate left ventricular hypertrophy. Left ventricular diastolic  parameters are consistent with Grade I diastolic dysfunction (impaired  relaxation). There is mild dyskinesis of the left ventricular, apex. There  is severe hypokinesis of the left  ventricular, mid-apical anterior wall, anteroseptal wall, inferior wall  and inferoseptal wall. No intracavitary thrombus is seen, but images are  suboptimal.  2. Right ventricular systolic function is normal. The right ventricular  size is  normal. There is mildly elevated pulmonary artery systolic  pressure. The estimated right ventricular systolic pressure is 36.2 mmHg.  3. Left atrial size was mildly dilated.  4. The mitral valve is normal in structure. No evidence of mitral valve  regurgitation.  5. Aortic valve gradients are underestimated due to poor left ventricular  systolic function. The aortic valve is tricuspid. Aortic valve  regurgitation is not visualized. Mild aortic valve stenosis.  6. The inferior vena cava is dilated in size with <50% respiratory  variability, suggesting right atrial pressure of 15 mmHg.   EKG:  EKG atrial fibrillation with poor rate control, resting heart rate 108 bpm.  QS pattern V1 through V5.  Recent Labs: 07/08/2019: B Natriuretic Peptide 1,020.0 07/10/2019: Magnesium 2.4 07/27/2019: ALT 35 08/04/2019: BUN 28; Creatinine, Ser 1.63; Hemoglobin 10.9; NT-Pro BNP 19,944; Platelets 343; Potassium 3.8; Sodium 138; TSH 8.930  Recent Lipid  Panel    Component Value Date/Time   CHOL 266 (H) 06/29/2019 2218   TRIG 117 06/29/2019 2218   HDL 51 06/29/2019 2218   CHOLHDL 5.2 06/29/2019 2218   VLDL 23 06/29/2019 2218   LDLCALC 192 (H) 06/29/2019 2218    Physical Exam:    VS:  BP (!) 88/52   Pulse (!) 108   Ht 5\' 2"  (1.575 m)   Wt 194 lb (88 kg)   SpO2 98%   BMI 35.48 kg/m     Wt Readings from Last 3 Encounters:  08/20/19 194 lb (88 kg)  08/06/19 197 lb 12.8 oz (89.7 kg)  08/04/19 199 lb 12.8 oz (90.6 kg)     GEN: Marked obesity. No acute distress HEENT: Normal NECK: No JVD. LYMPHATICS: No lymphadenopathy CARDIAC: Rapid irregularly irregular RR without murmur, gallop, or edema. VASCULAR:  Normal Pulses. No bruits. RESPIRATORY:  Clear to auscultation without rales, wheezing or rhonchi  ABDOMEN: Soft, non-tender, non-distended, No pulsatile mass, MUSCULOSKELETAL: No deformity  SKIN: Warm and dry NEUROLOGIC:  Alert and oriented x 3 PSYCHIATRIC:  Normal affect   ASSESSMENT:      1. Atrial fibrillation with RVR ([place])   2. Chronic systolic CHF (congestive heart failure) ([place])   3. Chronic anticoagulation   4. Coronary artery disease involving native coronary artery of native heart without angina pectoris   5. Mixed hyperlipidemia   6. Educated about COVID-19 virus infection    PLAN:    In order of problems listed above:  1. Still with rapid ventricular response despite Toprol-XL 200 mg/day.  Will check basic metabolic panel today and if appropriate start digoxin 0.125 mg on Monday, Wednesday, and Friday.  Adding digoxin may allow a slight decrease in the intensity of metoprolol succinate which could then lead to better blood pressure and allow additional heart failure agents to be added.  Continue apixaban.  He will need EP help concerning management of atrial fibrillation.  Will have her see Dr. Rayann Heman after the echo in July. 2. Only on furosemide and metoprolol tartrate as therapy for systolic heart failure.  Plan add spironolactone 12.5 mg Monday, Wednesday, and Friday, while simultaneously decreasing furosemide to 40 mg/day.  Plan 2D Doppler echocardiogram July 17.  Consider adding dapagliflozin and/or Arni depending upon blood pressure.  4 to 6-week follow-up.  Basic metabolic panel today to determine if the above changes are reasonable. 3. On apixaban for stroke prevention.  No bleeding complications despite Plavix/Eliquis combination. 4. Continue high intensity statin therapy and Plavix. 5. Continue high intensity statin therapy, Crestor 40 mg/day. 6. COVID-19 vaccination has been received.  Overall education and awareness concerning secondary risk prevention was discussed in detail: LDL less than 70, hemoglobin A1c less than 7, blood pressure target less than 130/80 mmHg, >150 minutes of moderate aerobic activity per week, avoidance of smoking, weight control (via diet and exercise), and continued surveillance/management of/for obstructive sleep  apnea.  Guideline directed therapy for left ventricular systolic dysfunction: Angiotensin receptor-neprilysin inhibitor (ARNI)-Entresto; beta-blocker therapy - carvedilol or metoprolol succinate; mineralocorticoid receptor antagonist (MRA) therapy -spironolactone or eplerenone.  These therapies have been shown to improve clinical outcomes including reduction of rehospitalization survival, and acute heart failure.   She continues to wear the LifeVest.  Echo in July will determine if we should consider implantable defibrillator.  Should see electrophysiology, Dr. Rayann Heman, after echocardiogram in July to discuss management of atrial fibrillation and defibrillator depending upon the EF.  Follow-up with myself or Truitt Merle after the  echocardiogram.   Medication Adjustments/Labs and Tests Ordered: Current medicines are reviewed at length with the patient today.  Concerns regarding medicines are outlined above.  Orders Placed This Encounter  Procedures  . Basic metabolic panel  . TSH  . EKG 12-Lead  . ECHOCARDIOGRAM COMPLETE   Meds ordered this encounter  Medications  . furosemide (LASIX) 40 MG tablet    Sig: Take 1 tablet (40 mg total) by mouth daily.    Dispense:  90 tablet    Refill:  1    Dose change    Patient Instructions  Medication Instructions:  1) DECREASE Furosemide to 40mg  once daily PENDING LABS WE MAY: 2) START Spironolactone 12.5mg  once daily on Monday, Wednesday and Friday. 3) START Digoxin 0.125mg  once daily on Monday, Wednesday and Friday  *If you need a refill on your cardiac medications before your next appointment, please call your pharmacy*   Lab Work: BMET today  Your physician recommends that you return for lab work in: mid June (16-18).   If you have labs (blood work) drawn today and your tests are completely normal, you will receive your results only by: 11-23-1987 MyChart Message (if you have MyChart) OR . A paper copy in the mail If you have any lab  test that is abnormal or we need to change your treatment, we will call you to review the results.   Testing/Procedures: Your physician has requested that you have an echocardiogram 7-10 days prior to your next office visit. Echocardiography is a painless test that uses sound waves to create images of your heart. It provides your doctor with information about the size and shape of your heart and how well your heart's chambers and valves are working. This procedure takes approximately one hour. There are no restrictions for this procedure.     Follow-Up: At [facility], you and your health needs are our priority.  As part of our continuing mission to provide you with exceptional heart care, we have created designated Provider Care Teams.  These Care Teams include your primary Cardiologist (physician) and Advanced Practice Providers (APPs -  Physician Assistants and Nurse Practitioners) who all work together to provide you with the care you need, when you need it.  We recommend signing up for the patient portal called "MyChart".  Sign up information is provided on this After Visit Summary.  MyChart is used to connect with patients for Virtual Visits (Telemedicine).  Patients are able to view lab/test results, encounter notes, upcoming appointments, etc.  Non-urgent messages can be sent to your provider as well.   To learn more about what you can do with MyChart, go to [facility].    Your next appointment:   6-7 weeks  The format for your next appointment:   In Person  Provider:   You may see [email or web address], MD or one of the following Advanced Practice Providers on your designated Care Team:    Terry Noe, NP  Norma Fredrickson, NP  Nada Boozer, NP    Other Instructions  Make sure you are weighing daily around the same time with the same amount of clothing.     Signed, Georgie Chard, MD  08/20/2019 12:41 PM    [facility]

## 2019-08-20 ENCOUNTER — Other Ambulatory Visit: Payer: Self-pay | Admitting: *Deleted

## 2019-08-20 ENCOUNTER — Encounter: Payer: Self-pay | Admitting: Interventional Cardiology

## 2019-08-20 ENCOUNTER — Ambulatory Visit (INDEPENDENT_AMBULATORY_CARE_PROVIDER_SITE_OTHER): Payer: Medicare Other | Admitting: Interventional Cardiology

## 2019-08-20 ENCOUNTER — Other Ambulatory Visit: Payer: Self-pay

## 2019-08-20 VITALS — BP 88/52 | HR 108 | Ht 62.0 in | Wt 194.0 lb

## 2019-08-20 DIAGNOSIS — I4891 Unspecified atrial fibrillation: Secondary | ICD-10-CM | POA: Diagnosis not present

## 2019-08-20 DIAGNOSIS — I5022 Chronic systolic (congestive) heart failure: Secondary | ICD-10-CM | POA: Diagnosis not present

## 2019-08-20 DIAGNOSIS — E782 Mixed hyperlipidemia: Secondary | ICD-10-CM

## 2019-08-20 DIAGNOSIS — I2510 Atherosclerotic heart disease of native coronary artery without angina pectoris: Secondary | ICD-10-CM

## 2019-08-20 DIAGNOSIS — Z79899 Other long term (current) drug therapy: Secondary | ICD-10-CM

## 2019-08-20 DIAGNOSIS — Z7901 Long term (current) use of anticoagulants: Secondary | ICD-10-CM | POA: Diagnosis not present

## 2019-08-20 DIAGNOSIS — Z7189 Other specified counseling: Secondary | ICD-10-CM

## 2019-08-20 MED ORDER — FUROSEMIDE 40 MG PO TABS: 40.0000 mg | ORAL_TABLET | Freq: Every day | ORAL | 1 refills | Status: DC

## 2019-08-20 NOTE — Addendum Note (Signed)
Addended by: [name] on: 08/20/2019 12:47 PM   Modules accepted: Orders

## 2019-08-20 NOTE — Patient Instructions (Addendum)
Medication Instructions:  1) DECREASE Furosemide to 40mg  once daily PENDING LABS WE MAY: 2) START Spironolactone 12.5mg  once daily on Monday, Wednesday and Friday. 3) START Digoxin 0.125mg  once daily on Monday, Wednesday and Friday  *If you need a refill on your cardiac medications before your next appointment, please call your pharmacy*   Lab Work: BMET today  Your physician recommends that you return for lab work in: mid June (16-18).   If you have labs (blood work) drawn today and your tests are completely normal, you will receive your results only by: 11-23-1987 MyChart Message (if you have MyChart) OR . A paper copy in the mail If you have any lab test that is abnormal or we need to change your treatment, we will call you to review the results.   Testing/Procedures: Your physician has requested that you have an echocardiogram 7-10 days prior to your next office visit. Echocardiography is a painless test that uses sound waves to create images of your heart. It provides your doctor with information about the size and shape of your heart and how well your heart's chambers and valves are working. This procedure takes approximately one hour. There are no restrictions for this procedure.     Follow-Up: At [facility], you and your health needs are our priority.  As part of our continuing mission to provide you with exceptional heart care, we have created designated Provider Care Teams.  These Care Teams include your primary Cardiologist (physician) and Advanced Practice Providers (APPs -  Physician Assistants and Nurse Practitioners) who all work together to provide you with the care you need, when you need it.  We recommend signing up for the patient portal called "MyChart".  Sign up information is provided on this After Visit Summary.  MyChart is used to connect with patients for Virtual Visits (Telemedicine).  Patients are able to view lab/test results, encounter notes, upcoming  appointments, etc.  Non-urgent messages can be sent to your provider as well.   To learn more about what you can do with MyChart, go to [facility].    Your next appointment:   6-7 weeks  The format for your next appointment:   In Person  Provider:   You may see [email or web address], MD or one of the following Advanced Practice Providers on your designated Care Team:    Lesleigh Noe, NP  Norma Fredrickson, NP  Nada Boozer, NP    Other Instructions  Make sure you are weighing daily around the same time with the same amount of clothing.

## 2019-08-21 LAB — BASIC METABOLIC PANEL
BUN/Creatinine Ratio: 9 — ABNORMAL LOW (ref 12–28)
BUN: 17 mg/dL (ref 8–27)
CO2: 26 mmol/L (ref 20–29)
Calcium: 9.7 mg/dL (ref 8.7–10.3)
Chloride: 95 mmol/L — ABNORMAL LOW (ref 96–106)
Creatinine, Ser: 1.81 mg/dL — ABNORMAL HIGH (ref 0.57–1.00)
GFR calc Af Amer: 30 mL/min/{1.73_m2} — ABNORMAL LOW (ref >59)
GFR calc non Af Amer: 26 mL/min/{1.73_m2} — ABNORMAL LOW (ref >59)
Glucose: 139 mg/dL — ABNORMAL HIGH (ref 65–99)
Potassium: 4.2 mmol/L (ref 3.5–5.2)
Sodium: 140 mmol/L (ref 134–144)

## 2019-08-25 ENCOUNTER — Telehealth: Payer: Self-pay | Admitting: *Deleted

## 2019-08-25 MED ORDER — SPIRONOLACTONE 25 MG PO TABS
12.5000 mg | ORAL_TABLET | ORAL | 3 refills | Status: DC
Start: 2019-08-26 — End: 2020-06-27

## 2019-08-25 MED ORDER — DIGOXIN 125 MCG PO TABS
0.1250 mg | ORAL_TABLET | ORAL | 3 refills | Status: DC
Start: 2019-08-26 — End: 2019-09-24

## 2019-08-25 NOTE — Telephone Encounter (Signed)
See result note.  

## 2019-08-25 NOTE — Telephone Encounter (Signed)
-----   Message from Lyn Records, MD sent at 08/21/2019 10:24 PM EDT ----- Let the patient know okay to start spironolactone on M, W, F, Decrease Furosemide to 40 mg daily. BMET 1 week later. A copy will be sent to [name], MD

## 2019-08-25 NOTE — Addendum Note (Signed)
Addended by: [name] on: 08/25/2019 11:29 AM   Modules accepted: Orders

## 2019-09-02 ENCOUNTER — Other Ambulatory Visit: Payer: Medicare Other

## 2019-09-03 LAB — TSH: TSH: 4.93 u[IU]/mL — ABNORMAL HIGH (ref 0.450–4.500)

## 2019-09-08 ENCOUNTER — Telehealth: Payer: Self-pay | Admitting: Interventional Cardiology

## 2019-09-08 NOTE — Telephone Encounter (Signed)
Left message for pt to call back.  Called pt to inquire whether or not she went to [facility] to get her BMET and Digoxin level drawn as requested.  If she has not been, she really needs to get those ASAP.

## 2019-09-15 NOTE — Telephone Encounter (Addendum)
Spoke with pt and she states she went to [facility] in [place] on 6/16 and had labs drawn.  States she took the lab order sheets I sent her with at her appt.  [facility] drew a TSH instead of a BMET and Digoxin level.  Pt scheduled for echo on 7/7.  Will send message to Dr. Katrinka Blazing to see if ok to wait for labs to be done on 7/7 at time of echo or if pt needs drawn sooner?  Spoke with [facility] and they said the only thing that was drawn was the TSH.

## 2019-09-22 NOTE — Telephone Encounter (Signed)
Called the pt to follow up with her re: her labs and she will go to the lab when she comes in for her Echo 09/23/19. Appt placed for the lab.

## 2019-09-23 ENCOUNTER — Other Ambulatory Visit: Payer: Self-pay

## 2019-09-23 ENCOUNTER — Ambulatory Visit (HOSPITAL_COMMUNITY): Payer: Medicare Other | Attending: Cardiology

## 2019-09-23 ENCOUNTER — Other Ambulatory Visit: Payer: Medicare Other | Admitting: *Deleted

## 2019-09-23 DIAGNOSIS — I4891 Unspecified atrial fibrillation: Secondary | ICD-10-CM | POA: Diagnosis not present

## 2019-09-23 DIAGNOSIS — I5022 Chronic systolic (congestive) heart failure: Secondary | ICD-10-CM | POA: Insufficient documentation

## 2019-09-23 LAB — BASIC METABOLIC PANEL
BUN/Creatinine Ratio: 6 — ABNORMAL LOW (ref 12–28)
BUN: 8 mg/dL (ref 8–27)
CO2: 27 mmol/L (ref 20–29)
Calcium: 10.1 mg/dL (ref 8.7–10.3)
Chloride: 99 mmol/L (ref 96–106)
Creatinine, Ser: 1.45 mg/dL — ABNORMAL HIGH (ref 0.57–1.00)
GFR calc Af Amer: 40 mL/min/{1.73_m2} — ABNORMAL LOW (ref >59)
GFR calc non Af Amer: 35 mL/min/{1.73_m2} — ABNORMAL LOW (ref >59)
Glucose: 248 mg/dL — ABNORMAL HIGH (ref 65–99)
Potassium: 3.7 mmol/L (ref 3.5–5.2)
Sodium: 139 mmol/L (ref 134–144)

## 2019-09-23 LAB — DIGOXIN LEVEL: Digoxin, Serum: 0.9 ng/mL (ref 0.5–0.9)

## 2019-09-23 MED ORDER — PERFLUTREN LIPID MICROSPHERE
1.0000 mL | INTRAVENOUS | Status: AC | PRN
  Administered 2019-09-23: 2 mL via INTRAVENOUS

## 2019-09-24 ENCOUNTER — Telehealth: Payer: Self-pay | Admitting: *Deleted

## 2019-09-24 DIAGNOSIS — I5022 Chronic systolic (congestive) heart failure: Secondary | ICD-10-CM

## 2019-09-24 DIAGNOSIS — I4891 Unspecified atrial fibrillation: Secondary | ICD-10-CM

## 2019-09-24 MED ORDER — DAPAGLIFLOZIN PROPANEDIOL 5 MG PO TABS
5.0000 mg | ORAL_TABLET | Freq: Every day | ORAL | 11 refills | Status: DC
Start: 2019-09-24 — End: 2019-09-25

## 2019-09-24 MED ORDER — DIGOXIN 125 MCG PO TABS
0.1250 mg | ORAL_TABLET | ORAL | 3 refills | Status: DC
Start: 2019-09-24 — End: 2020-06-27

## 2019-09-24 NOTE — Telephone Encounter (Signed)
-----   Message from Lyn Records, MD sent at 09/23/2019  8:23 PM EDT ----- Let the patient know heart has not changed. Start Dapagliflozin 5 mg daily. One week f/u after starting. Okay to stop wearing LifeVest and needs appoint ment with EP for consideration of AICD. A copy will be sent to [name], MD

## 2019-09-24 NOTE — Telephone Encounter (Signed)
Terry Records, MD  [name], RN Let the patient know kidney function better. Decrease digoxin to 2 days a week. See echo for other recs.    Spoke with pt and reviewed lab and echo results and recommendations.  Pt agreeable to plan.  Medication orders and referral ordered.

## 2019-09-25 ENCOUNTER — Telehealth: Payer: Self-pay | Admitting: Interventional Cardiology

## 2019-09-25 MED ORDER — DAPAGLIFLOZIN PROPANEDIOL 5 MG PO TABS: 5.0000 mg | ORAL_TABLET | Freq: Every day | ORAL | 11 refills | Status: DC

## 2019-09-25 NOTE — Telephone Encounter (Signed)
*  STAT* If patient is at the pharmacy, call can be transferred to refill team.   1. Which medications need to be refilled? (please list name of each medication and dose if known)  dapagliflozin propanediol (FARXIGA) 5 MG TABS tablet  2. Which pharmacy/location (including street and city if local pharmacy) is medication to be sent to? [id number] - [place], [place] - [address]  3. Do they need a 30 day or 90 day supply? 30 day supply

## 2019-09-25 NOTE — Telephone Encounter (Signed)
Resent prescription.  Called pharmacy and verified that they did receive it.

## 2019-09-28 NOTE — Progress Notes (Signed)
Cardiology Office Note:    Date:  09/30/2019   ID:  Terry Hanson, DOB [birth year], MRN [id number]  PCP:  [name], MD  Cardiologist:  Lesleigh Noe, MD   Referring MD: [name], MD   Chief Complaint  Patient presents with  . Congestive Heart Failure  . Coronary Artery Disease   2 History of Present Illness:    Terry Hanson is a 79 y.o. female with a hx of anterior myocardial infarction April 2021 with PCI of the proximal LAD- on Plavix solely, ischemic cardiomyopathy (discharged with LifeVest), chronic stage III kidney disease, diabetes mellitus II, hyperlipidemia,recently placed on amiodarone and apixaban for atrial fibrillation and ventricular tachycardia noted on LifeVest admitted5/66from the officewith progressive weakness, nausea/vomiting and diarrhea that resolved after amiodarone stopped..  She has a difficult time getting to [place].  She would like cardiology follow-up to be done in [facility] as it would make it easier for her to make appointments and to feel comfortable driving on her own.  Her major complaint is exertional fatigue.  She otherwise is doing okay.  There is no orthopnea.  She has some weakness and dizziness on occasion when she stands.  She denies angina.  She has not needed nitroglycerin sublingually.  There was no change in EF which is still in the 30 to 35% range.  I discontinue the LifeVest.  We will have her seen by EP to consider defibrillator given extensive scarring of the anterior wall with a no reflow result at the time of PCI.  She was not a good candidate for ablation or other drug management for atrial fibrillation due to LV dysfunction.  EP felt we should manage with rate control is much as possible.  Past Medical History:  Diagnosis Date  . Arthritis   . Asthma   . Diabetes mellitus (HCC)   . Diabetes mellitus without complication (HCC)   . Enlarged heart   . Hypercholesterolemia   . Hyperlipidemia   . Ischemic  cardiomyopathy    a. EF 30-35% by echo in 06/2019  . STEMI (ST elevation myocardial infarction) (HCC)    a. 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    Past Surgical History:  Procedure Laterality Date  . ABDOMINAL HYSTERECTOMY    . CORONARY/GRAFT ACUTE MI REVASCULARIZATION N/A 06/29/2019   Procedure: Coronary/Graft Acute MI Revascularization;  Surgeon: Lyn Records, MD;  [facility] INVAS[facility] CV LAB;  Service: Cardiovascular;  Laterality: N/A;  . LEFT HEART CATH AND CORONARY ANGIOGRAPHY N/A 06/29/2019   Procedure: LEFT HEART CATH AND CORONARY ANGIOGRAPHY;  Surgeon: Lyn Records, MD;  Location: [facility] INVASIVE CV LAB;  Service: Cardiovascular;  Laterality: N/A;    Current Medications: Current Meds  Medication Sig  . apixaban (ELIQUIS) 5 MG TABS tablet Take 1 tablet (5 mg total) by mouth 2 (two) times daily.  . clopidogrel (PLAVIX) 75 MG tablet Take 75 mg by mouth daily.  . dapagliflozin propanediol (FARXIGA) 5 MG TABS tablet Take 1 tablet (5 mg total) by mouth daily before breakfast.  . digoxin (LANOXIN) 0.125 MG tablet Take 1 tablet (0.125 mg total) by mouth 2 (two) times a week.  . furosemide (LASIX) 40 MG tablet Take 1 tablet (40 mg total) by mouth daily.  Marland Kitchen levothyroxine (SYNTHROID) 25 MCG tablet Take 1 tablet (25 mcg total) by mouth daily before breakfast.  . metoprolol succinate (TOPROL-XL) 200 MG 24 hr tablet Take 1 tablet (200 mg total) by mouth daily. Take with  or immediately following a meal.  . nitroGLYCERIN (NITROSTAT) 0.4 MG SL tablet Place 1 tablet (0.4 mg total) under the tongue every 5 (five) minutes as needed.  . potassium chloride SA (KLOR-CON M20) 20 MEQ tablet Take 1 tablet (20 mEq total) by mouth daily.  . rosuvastatin (CRESTOR) 40 MG tablet Take 1 tablet (40 mg total) by mouth daily.  Marland Kitchen spironolactone (ALDACTONE) 25 MG tablet Take 0.5 tablets (12.5 mg total) by mouth every Monday, Wednesday, and Friday.  . vitamin B-12 (CYANOCOBALAMIN) 500 MCG tablet Take 1  tablet (500 mcg total) by mouth daily.     Allergies:   Codeine, Penicillins, Shrimp [shellfish allergy], Sulfa antibiotics, and Penicillins   Social History   Socioeconomic History  . Marital status: Married    Spouse name: Not on file  . Number of children: Not on file  . Years of education: Not on file  . Highest education level: Not on file  Occupational History  . Not on file  Tobacco Use  . Smoking status: Never Smoker  . Smokeless tobacco: Former Systems developer    Types: Secondary school teacher  . Vaping Use: Never used  Substance and Sexual Activity  . Alcohol use: Not Currently  . Drug use: Never  . Sexual activity: Not on file  Other Topics Concern  . Not on file  Social History Narrative   ** Merged History Encounter **       Social Determinants of Health   Financial Resource Strain:   . Difficulty of Paying Living Expenses:   Food Insecurity:   . Worried About Charity fundraiser in the Last Year:   . Arboriculturist in the Last Year:   Transportation Needs:   . Film/video editor (Medical):   Marland Kitchen Lack of Transportation (Non-Medical):   Physical Activity:   . Days of Exercise per Week:   . Minutes of Exercise per Session:   Stress:   . Feeling of Stress :   Social Connections:   . Frequency of Communication with Friends and Family:   . Frequency of Social Gatherings with Friends and Family:   . Attends Religious Services:   . Active Member of Clubs or Organizations:   . Attends Archivist Meetings:   Marland Kitchen Marital Status:      Family History: The patient's family history includes Heart disease in her mother.  ROS:   Please see the history of present illness.     All other systems reviewed and are negative.  EKGs/Labs/Other Studies Reviewed:    The following studies were reviewed today:  2D Doppler echocardiogram performed in September 23, 2019  EKG:  EKG not repeated.  The tracing performed on August 20, 2019 demonstrated atrial fibrillation with RVR,  anteroseptal infarction, and right axis deviation.  Recent Labs: 07/08/2019: B Natriuretic Peptide 1,020.0 07/10/2019: Magnesium 2.4 07/27/2019: ALT 35 08/04/2019: Hemoglobin 10.9; NT-Pro BNP 19,944; Platelets 343 09/02/2019: TSH 4.930 09/23/2019: BUN 8; Creatinine, Ser 1.45; Potassium 3.7; Sodium 139  Recent Lipid Panel    Component Value Date/Time   CHOL 266 (H) 06/29/2019 2218   TRIG 117 06/29/2019 2218   HDL 51 06/29/2019 2218   CHOLHDL 5.2 06/29/2019 2218   VLDL 23 06/29/2019 2218   LDLCALC 192 (H) 06/29/2019 2218    Physical Exam:    VS:  BP 120/70   Pulse 83   Ht '5\' 2"'$  (1.575 m)   Wt 192 lb 3.2 oz (87.2 kg)   SpO2 98%  BMI 35.15 kg/m     Wt Readings from Last 3 Encounters:  09/30/19 192 lb 3.2 oz (87.2 kg)  08/20/19 194 lb (88 kg)  08/06/19 197 lb 12.8 oz (89.7 kg)     GEN: Consistent with age.  Morbid obesity.. No acute distress HEENT: Normal NECK: No JVD. LYMPHATICS: No lymphadenopathy CARDIAC: Irregularly irregular RR without murmur, gallop, or edema. VASCULAR:  Normal Pulses. No bruits. RESPIRATORY:  Clear to auscultation without rales, wheezing or rhonchi  ABDOMEN: Soft, non-tender, non-distended, No pulsatile mass, MUSCULOSKELETAL: No deformity  SKIN: Warm and dry NEUROLOGIC:  Alert and oriented x 3 PSYCHIATRIC:  Normal affect   ASSESSMENT:    1. Coronary artery disease involving native coronary artery of native heart without angina pectoris   2. Chronic systolic CHF (congestive heart failure) ([place])   3. Atrial fibrillation with RVR ([place])   4. Chronic anticoagulation   5. Hypercholesterolemia   6. 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 Secondary prevention discussed.  Encouraged greater aerobic activity. 2. Continue Monday Wednesday Friday Aldactone, Farxiga 5 mg/day, Toprol-XL 200 mg/day, Lasix, Lanoxin.   Today we will add low-dose Arni starting Entresto at 24/26 mg each evening for 1 week then increasing to twice daily thereafter with basic metabolic panel to be performed in 10 days.  We will also arrange an EP consultation to reconsider A. fib treatment and the possibility of AICD given no improvement in LV function. 3. EP consult 4. Continue Eliquis 5 mg twice daily.  Continue Lanoxin and high-dose Toprol-XL for rate control. 5. Add intensity statin therapy for LDL target less than 70 6. Wilder Glade has been instituted as a management strategy for both CKD, diabetes, and systolic heart failure.  We will need to watch kidney function and potassium level closely given her mix of medications.  Be met in 7 to 10 days.   At her request, we will see if it is possible to switch her cardiology care to one of our providers in [facility].  We will also see if she can be seen by EP in [facility] Lovena Le)  Medication Adjustments/Labs and Tests Ordered: Current medicines are reviewed at length with the patient today.  Concerns regarding medicines are outlined above.  Orders Placed This Encounter  Procedures  . Basic metabolic panel  . AMB referral to cardiac rehabilitation   Meds ordered this encounter  Medications  . sacubitril-valsartan (ENTRESTO) 24-26 MG    Sig: Take 1 tablet by mouth 2 (two) times daily.    Dispense:  60 tablet    Refill:  11    Patient Instructions  Medication Instructions:  1) START Entresto 24/'26mg'$  twice daily.  For one week, take one tablet at night only, then increase to twice daily.  *If you need a refill on your cardiac medications before your next appointment, please call your pharmacy*   Lab Work: Please take the provided lab sheet to your local [facility] in 2 weeks to have labs checked.  If you have labs (blood work) drawn today and your tests are completely normal, you will receive your results only by: Marland Kitchen MyChart Message (if you have MyChart) OR . A  paper copy in the mail If you have any lab test that is abnormal or we need to change your treatment, we will call you to review the results.  Testing/Procedures: None   Follow-Up: At [facility], you and your health needs are our priority.  As part of our continuing mission to provide you with exceptional heart care, we have created designated Provider Care Teams.  These Care Teams include your primary Cardiologist (physician) and Advanced Practice Providers (APPs -  Physician Assistants and Nurse Practitioners) who all work together to provide you with the care you need, when you need it.  We recommend signing up for the patient portal called "MyChart".  Sign up information is provided on this After Visit Summary.  MyChart is used to connect with patients for Virtual Visits (Telemedicine).  Patients are able to view lab/test results, encounter notes, upcoming appointments, etc.  Non-urgent messages can be sent to your provider as well.   To learn more about what you can do with MyChart, go to [email or web address].    Your next appointment:   1 month(s)  The format for your next appointment:   In Person  Provider:   You may see any of the physicians or one of the following Advanced Practice Providers on your designated Care Team:    Bernerd Pho, PA-C   Ermalinda Barrios, PA-C     Other Instructions You have been referred to Dr. Lovena Le. (Please schedule in [place])  You have been referred to Cardiac Rehab at [facility].      Signed, Sinclair Grooms, MD  09/30/2019 12:50 PM    [facility]

## 2019-09-30 ENCOUNTER — Encounter: Payer: Self-pay | Admitting: Interventional Cardiology

## 2019-09-30 ENCOUNTER — Other Ambulatory Visit: Payer: Self-pay

## 2019-09-30 ENCOUNTER — Ambulatory Visit (INDEPENDENT_AMBULATORY_CARE_PROVIDER_SITE_OTHER): Payer: Medicare Other | Admitting: Interventional Cardiology

## 2019-09-30 VITALS — BP 120/70 | HR 83 | Ht 62.0 in | Wt 192.2 lb

## 2019-09-30 DIAGNOSIS — I2510 Atherosclerotic heart disease of native coronary artery without angina pectoris: Secondary | ICD-10-CM

## 2019-09-30 DIAGNOSIS — I5022 Chronic systolic (congestive) heart failure: Secondary | ICD-10-CM

## 2019-09-30 DIAGNOSIS — Z7901 Long term (current) use of anticoagulants: Secondary | ICD-10-CM

## 2019-09-30 DIAGNOSIS — I4891 Unspecified atrial fibrillation: Secondary | ICD-10-CM | POA: Diagnosis not present

## 2019-09-30 DIAGNOSIS — E7800 Pure hypercholesterolemia, unspecified: Secondary | ICD-10-CM

## 2019-09-30 DIAGNOSIS — E1152 Type 2 diabetes mellitus with diabetic peripheral angiopathy with gangrene: Secondary | ICD-10-CM

## 2019-09-30 DIAGNOSIS — Z794 Long term (current) use of insulin: Secondary | ICD-10-CM

## 2019-09-30 MED ORDER — ENTRESTO 24-26 MG PO TABS: 1.0000 | ORAL_TABLET | Freq: Two times a day (BID) | ORAL | 11 refills | Status: DC

## 2019-09-30 NOTE — Patient Instructions (Signed)
Medication Instructions:  1) START Entresto 24/26mg  twice daily.  For one week, take one tablet at night only, then increase to twice daily.  *If you need a refill on your cardiac medications before your next appointment, please call your pharmacy*   Lab Work: Please take the provided lab sheet to your local [facility] in 2 weeks to have labs checked.  If you have labs (blood work) drawn today and your tests are completely normal, you will receive your results only by: Marland Kitchen MyChart Message (if you have MyChart) OR . A paper copy in the mail If you have any lab test that is abnormal or we need to change your treatment, we will call you to review the results.   Testing/Procedures: None   Follow-Up: At [facility], you and your health needs are our priority.  As part of our continuing mission to provide you with exceptional heart care, we have created designated Provider Care Teams.  These Care Teams include your primary Cardiologist (physician) and Advanced Practice Providers (APPs -  Physician Assistants and Nurse Practitioners) who all work together to provide you with the care you need, when you need it.  We recommend signing up for the patient portal called "MyChart".  Sign up information is provided on this After Visit Summary.  MyChart is used to connect with patients for Virtual Visits (Telemedicine).  Patients are able to view lab/test results, encounter notes, upcoming appointments, etc.  Non-urgent messages can be sent to your provider as well.   To learn more about what you can do with MyChart, go to [email or web address].    Your next appointment:   1 month(s)  The format for your next appointment:   In Person  Provider:   You may see any of the physicians or one of the following Advanced Practice Providers on your designated Care Team:    Randall An, PA-C   Jacolyn Reedy, PA-C     Other Instructions You have been referred to Dr. Ladona Ridgel. (Please schedule in  [place])  You have been referred to Cardiac Rehab at [facility].

## 2019-10-02 ENCOUNTER — Other Ambulatory Visit: Payer: Self-pay

## 2019-10-02 MED ORDER — ROSUVASTATIN CALCIUM 40 MG PO TABS: 40.0000 mg | ORAL_TABLET | Freq: Every day | ORAL | 3 refills | Status: DC

## 2019-10-02 NOTE — Telephone Encounter (Signed)
Pt's medication was sent to pt's pharmacy as requested. Confirmation received.  °

## 2019-10-16 LAB — BASIC METABOLIC PANEL
BUN/Creatinine Ratio: 8 — ABNORMAL LOW (ref 12–28)
BUN: 14 mg/dL (ref 8–27)
CO2: 23 mmol/L (ref 20–29)
Calcium: 9.6 mg/dL (ref 8.7–10.3)
Chloride: 98 mmol/L (ref 96–106)
Creatinine, Ser: 1.77 mg/dL — ABNORMAL HIGH (ref 0.57–1.00)
GFR calc Af Amer: 31 mL/min/{1.73_m2} — ABNORMAL LOW (ref >59)
GFR calc non Af Amer: 27 mL/min/{1.73_m2} — ABNORMAL LOW (ref >59)
Glucose: 171 mg/dL — ABNORMAL HIGH (ref 65–99)
Potassium: 4 mmol/L (ref 3.5–5.2)
Sodium: 137 mmol/L (ref 134–144)

## 2019-11-03 NOTE — Progress Notes (Signed)
Cardiology Office Note    Date:  11/09/2019   ID:  Cayman, Brogden 02/06/1941, MRN [id number]  PCP:  [name], MD  Cardiologist: Lesleigh Noe, MD EPS: None  Chief Complaint  Patient presents with  . Follow-up  . Dizziness    History of Present Illness:  Terry Hanson is a 79 y.o. female with history of CAD status post anterior STEMI treated with stent to the LAD with residual nonobstructive disease elsewhere.  Ischemic cardiomyopathy LVEF 30 to 35% 06/2019, no improvement on echo 09/23/2019, chronic systolic and diastolic CHF, atrial fib with RVR and SVT treated with amiodarone but had to be stopped because of GI side effects.  To see Dr. Ladona Ridgel for EP and possible ICD, HLD, DM type II.  Patient last saw Dr. Katrinka Blazing 09/30/2019 at which time he started Entresto 24/26 mg continue Farxiga, Aldactone, Toprol, Lasix and Lanoxin as well as Eliquis.  Need to watch kidney function potassium on Farxiga.  Patient requested switching to be followed closely in [place].  Patient comes in accompanied by her daughter. She complains of fatigue and also dizziness when she stands up-worse in the am. BP running low.Started chewing tobacco-had quit but started last month because of nerves. Getting some salt in her diet.  Past Medical History:  Diagnosis Date  . Arthritis   . Asthma   . Diabetes mellitus (HCC)   . Diabetes mellitus without complication (HCC)   . Enlarged heart   . Hypercholesterolemia   . Hyperlipidemia   . Ischemic cardiomyopathy    a. EF 30-35% by echo in 06/2019  . STEMI (ST elevation myocardial infarction) (HCC)    a. 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    Past Surgical History:  Procedure Laterality Date  . ABDOMINAL HYSTERECTOMY    . CORONARY/GRAFT ACUTE MI REVASCULARIZATION N/A 06/29/2019   Procedure: Coronary/Graft Acute MI Revascularization;  Surgeon: Lyn Records, MD;  Location: [facility] INVASIVE CV LAB;  Service: Cardiovascular;  Laterality: N/A;  .  LEFT HEART CATH AND CORONARY ANGIOGRAPHY N/A 06/29/2019   Procedure: LEFT HEART CATH AND CORONARY ANGIOGRAPHY;  Surgeon: Lyn Records, MD;  Location: [facility] INVASIVE CV LAB;  Service: Cardiovascular;  Laterality: N/A;    Current Medications: Current Meds  Medication Sig  . apixaban (ELIQUIS) 5 MG TABS tablet Take 1 tablet (5 mg total) by mouth 2 (two) times daily.  . clopidogrel (PLAVIX) 75 MG tablet Take 75 mg by mouth daily.  . dapagliflozin propanediol (FARXIGA) 5 MG TABS tablet Take 1 tablet (5 mg total) by mouth daily before breakfast.  . digoxin (LANOXIN) 0.125 MG tablet Take 1 tablet (0.125 mg total) by mouth 2 (two) times a week.  . levothyroxine (SYNTHROID) 25 MCG tablet Take 1 tablet (25 mcg total) by mouth daily before breakfast.  . metoprolol succinate (TOPROL-XL) 200 MG 24 hr tablet Take 1 tablet (200 mg total) by mouth daily. Take with or immediately following a meal.  . nitroGLYCERIN (NITROSTAT) 0.4 MG SL tablet Place 1 tablet (0.4 mg total) under the tongue every 5 (five) minutes as needed.  . potassium chloride SA (KLOR-CON M20) 20 MEQ tablet Take 1 tablet (20 mEq total) by mouth daily.  . rosuvastatin (CRESTOR) 40 MG tablet Take 1 tablet (40 mg total) by mouth daily.  . sacubitril-valsartan (ENTRESTO) 24-26 MG Take 1 tablet by mouth 2 (two) times daily.  Marland Kitchen spironolactone (ALDACTONE) 25 MG tablet Take 0.5 tablets (12.5 mg total) by mouth every  Monday, Wednesday, and Friday.  . vitamin B-12 (CYANOCOBALAMIN) 500 MCG tablet Take 1 tablet (500 mcg total) by mouth daily.  . [DISCONTINUED] furosemide (LASIX) 40 MG tablet Take 1 tablet (40 mg total) by mouth daily.     Allergies:   Codeine, Penicillins, Shrimp [shellfish allergy], Sulfa antibiotics, and Penicillins   Social History   Socioeconomic History  . Marital status: Married    Spouse name: Not on file  . Number of children: Not on file  . Years of education: Not on file  . Highest education level: Not on file    Occupational History  . Not on file  Tobacco Use  . Smoking status: Never Smoker  . Smokeless tobacco: Former Neurosurgeon    Types: Engineer, drilling  . Vaping Use: Never used  Substance and Sexual Activity  . Alcohol use: Not Currently  . Drug use: Never  . Sexual activity: Not on file  Other Topics Concern  . Not on file  Social History Narrative   ** Merged History Encounter **       Social Determinants of Health   Financial Resource Strain:   . Difficulty of Paying Living Expenses: Not on file  Food Insecurity:   . Worried About Programme researcher, broadcasting/film/video in the Last Year: Not on file  . Ran Out of Food in the Last Year: Not on file  Transportation Needs:   . Lack of Transportation (Medical): Not on file  . Lack of Transportation (Non-Medical): Not on file  Physical Activity:   . Days of Exercise per Week: Not on file  . Minutes of Exercise per Session: Not on file  Stress:   . Feeling of Stress : Not on file  Social Connections:   . Frequency of Communication with Friends and Family: Not on file  . Frequency of Social Gatherings with Friends and Family: Not on file  . Attends Religious Services: Not on file  . Active Member of Clubs or Organizations: Not on file  . Attends Banker Meetings: Not on file  . Marital Status: Not on file     Family History:  The patient's family history includes Heart disease in her mother.   ROS:   Please see the history of present illness.    ROS All other systems reviewed and are negative.   PHYSICAL EXAM:   VS:  BP (!) 100/58   Pulse 63   Ht 5\' 2"  (1.575 m)   Wt 189 lb (85.7 kg)   SpO2 98%   BMI 34.57 kg/m   Physical Exam  GEN: Obese, in no acute distress  Neck: no JVD, carotid bruits, or masses Cardiac:irreg irreg; no murmurs, rubs, or gallops  Respiratory:  clear to auscultation bilaterally, normal work of breathing GI: soft, nontender, nondistended, + BS Ext: without cyanosis, clubbing, or edema, Good distal  pulses bilaterally Neuro:  Alert and Oriented x 3 Psych: euthymic mood, full affect  Wt Readings from Last 3 Encounters:  11/09/19 189 lb (85.7 kg)  09/30/19 192 lb 3.2 oz (87.2 kg)  08/20/19 194 lb (88 kg)      Studies/Labs Reviewed:   EKG:  EKG is not ordered today.    Recent Labs: 07/08/2019: B Natriuretic Peptide 1,020.0 07/10/2019: Magnesium 2.4 07/27/2019: ALT 35 08/04/2019: Hemoglobin 10.9; NT-Pro BNP 19,944; Platelets 343 09/02/2019: TSH 4.930 10/15/2019: BUN 14; Creatinine, Ser 1.77; Potassium 4.0; Sodium 137   Lipid Panel    Component Value Date/Time   CHOL 266 (  H) 06/29/2019 2218   TRIG 117 06/29/2019 2218   HDL 51 06/29/2019 2218   CHOLHDL 5.2 06/29/2019 2218   VLDL 23 06/29/2019 2218   LDLCALC 192 (H) 06/29/2019 2218    Additional studies/ records that were reviewed today include:  2D echo 09/23/2019 IMPRESSIONS     1. Left ventricular ejection fraction, by estimation, is 30 to 35%. The  left ventricle has moderately decreased function. The left ventricle  demonstrates regional wall motion abnormalities (see scoring  diagram/findings for description). Left ventricular   diastolic function could not be evaluated.   2. Right ventricular systolic function is normal. The right ventricular  size is normal. There is normal pulmonary artery systolic pressure.   3. Left atrial size was mildly dilated.   4. The mitral valve is normal in structure. Mild mitral valve  regurgitation. No evidence of mitral stenosis.   5. The aortic valve is tricuspid. Aortic valve regurgitation is trivial.  Mild to moderate aortic valve sclerosis/calcification is present, without  any evidence of aortic stenosis.   Conclusion(s)/Recommendation(s): Reduced LVEF with severe  hypokinesis/akinesis in distribution noted. Inferior/inferolateral walls  from base to mid wall are only portions near normal.   Echo 06/30/2019 IMPRESSIONS     1. Left ventricular ejection fraction, by  estimation, is 30 to 35%. The  left ventricle has moderately decreased function. The left ventricle  demonstrates regional wall motion abnormalities (see scoring  diagram/findings for description). There is  moderate left ventricular hypertrophy. Left ventricular diastolic  parameters are consistent with Grade I diastolic dysfunction (impaired  relaxation). There is mild dyskinesis of the left ventricular, apex. There  is severe hypokinesis of the left  ventricular, mid-apical anterior wall, anteroseptal wall, inferior wall  and inferoseptal wall. No intracavitary thrombus is seen, but images are  suboptimal.   2. Right ventricular systolic function is normal. The right ventricular  size is normal. There is mildly elevated pulmonary artery systolic  pressure. The estimated right ventricular systolic pressure is 36.2 mmHg.   3. Left atrial size was mildly dilated.   4. The mitral valve is normal in structure. No evidence of mitral valve  regurgitation.   5. Aortic valve gradients are underestimated due to poor left ventricular  systolic function. The aortic valve is tricuspid. Aortic valve  regurgitation is not visualized. Mild aortic valve stenosis.   6. The inferior vena cava is dilated in size with <50% respiratory  variability, suggesting right atrial pressure of 15 mmHg.   Right station 4/12/2021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  99% stenosis/thrombotic with TIMI I flow reduced to 0% with TIMI grade III flow following stent implantation using a 32 x 2.5 mm diameter onyx postdilated to 3.0 mm throughout the proximal two thirds of the stent.  The LAD is otherwise widely patent.  Less than 20% proximal left main  Small first obtuse marginal with proximal 70% narrowing.  Mid to distal circumflex with 60% narrowing.  Right coronary arises somewhat anterior.  There is 40 to 50% proximal/ostial narrowing.  There is 70% hazy eccentric mid RCA  stenosis.  LVEDP 40, consistent with acute systolic heart failure.  Prior status of the left ventricle was not known.   RECOMMENDATIONS:    Management of acute presumed systolic heart failure: BiPAP; IV Lasix; Foley catheter; IV nitroglycerin 10-100 mics to keep systolic blood pressure 110 to 120 mmHg.  High intensity statin therapy  Once volume overload has resolved, beta-blocker and angiotensin receptor/ACE/Arni therapy  should be considered  2D Doppler echocardiogram to assess LV function in a.m.  Medical therapy of right coronary and circumflex disease.  Aggressive risk factor modification.    ASSESSMENT:    1. Coronary artery disease involving native coronary artery of native heart without angina pectoris   2. Ischemic cardiomyopathy   3. Stage 3 chronic kidney disease, unspecified whether stage 3a or 3b CKD   4. Hyperlipidemia, unspecified hyperlipidemia type   5. 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 Atrial fibrillation with RVR (HCC)   7. Ventricular tachycardia (HCC)   8. Tobacco abuse      PLAN:  In order of problems listed above:   CAD status post anterior wall MI 06/2019 treated with DES to the proximal LAD on Plavix, no ASA because of Eliquis. No angina  Ischemic cardiomyopathy  EF did not improve.  To see EP for treatment of atrial fibrillation and AICD, currently compensated. On farxiga, dig, toprol, spironolactone, entresto  CKD stage III Crt. 1.77 10/15/19  Hyperlipidemia on crestor  DM type II  Atrial fibrillation and ventricular tachycardia no longer has a LifeVest treated with amiodarone and apixaban but developed progressive weakness nausea vomiting and diarrhea that resolved after amiodarone stopped. On Toprol XL 200 mg daily.   Tobacco abuse-chewing tobacco-cessation discussed  Hypotension with dizziness. Will decrease lasix 20 mg daily and Kdur 10  meq daily. Can take whole tablet for weight gain 2-3 lbs overnight, swelling or dyspnea.   Medication Adjustments/Labs and Tests Ordered: Current medicines are reviewed at length with the patient today.  Concerns regarding medicines are outlined above.  Medication changes, Labs and Tests ordered today are listed in the Patient Instructions below. There are no Patient Instructions on file for this visit.   Elson Clan, PA-C  11/09/2019 2:30 PM    [facility] [address] [place], [place], [place]  [address] Phone: [phone]; Fax: [phone]

## 2019-11-09 ENCOUNTER — Encounter: Payer: Self-pay | Admitting: Physician Assistant

## 2019-11-09 ENCOUNTER — Ambulatory Visit (INDEPENDENT_AMBULATORY_CARE_PROVIDER_SITE_OTHER): Payer: Medicare Other | Admitting: Physician Assistant

## 2019-11-09 ENCOUNTER — Other Ambulatory Visit: Payer: Self-pay

## 2019-11-09 VITALS — BP 100/58 | HR 63 | Ht 62.0 in | Wt 189.0 lb

## 2019-11-09 DIAGNOSIS — Z794 Long term (current) use of insulin: Secondary | ICD-10-CM

## 2019-11-09 DIAGNOSIS — I255 Ischemic cardiomyopathy: Secondary | ICD-10-CM | POA: Diagnosis not present

## 2019-11-09 DIAGNOSIS — I472 Ventricular tachycardia, unspecified: Secondary | ICD-10-CM

## 2019-11-09 DIAGNOSIS — I2510 Atherosclerotic heart disease of native coronary artery without angina pectoris: Secondary | ICD-10-CM

## 2019-11-09 DIAGNOSIS — E785 Hyperlipidemia, unspecified: Secondary | ICD-10-CM

## 2019-11-09 DIAGNOSIS — I4891 Unspecified atrial fibrillation: Secondary | ICD-10-CM

## 2019-11-09 DIAGNOSIS — N183 Chronic kidney disease, stage 3 unspecified: Secondary | ICD-10-CM

## 2019-11-09 DIAGNOSIS — E1152 Type 2 diabetes mellitus with diabetic peripheral angiopathy with gangrene: Secondary | ICD-10-CM

## 2019-11-09 DIAGNOSIS — Z720 Tobacco use: Secondary | ICD-10-CM

## 2019-11-09 MED ORDER — FUROSEMIDE 40 MG PO TABS
20.0000 mg | ORAL_TABLET | Freq: Every day | ORAL | 3 refills | Status: DC
Start: 2019-11-09 — End: 2020-03-21

## 2019-11-09 MED ORDER — POTASSIUM CHLORIDE ER 10 MEQ PO TBCR
10.0000 meq | EXTENDED_RELEASE_TABLET | Freq: Every day | ORAL | 3 refills | Status: DC
Start: 2019-11-09 — End: 2020-08-03

## 2019-11-09 NOTE — Patient Instructions (Signed)
Medication Instructions:  Your physician recommends that you continue on your current medications as directed. Please refer to the Current Medication list given to you today.  Decrease Lasix to 20 mg Daily  Decrease Potassium to 10 meq daily   *If you need a refill on your cardiac medications before your next appointment, please call your pharmacy*   Lab Work: Your physician recommends that you return for lab work in: Fasting   If you have labs (blood work) drawn today and your tests are completely normal, you will receive your results only by: Marland Kitchen MyChart Message (if you have MyChart) OR . A paper copy in the mail If you have any lab test that is abnormal or we need to change your treatment, we will call you to review the results.   Testing/Procedures: NONE    Follow-Up: At [facility], you and your health needs are our priority.  As part of our continuing mission to provide you with exceptional heart care, we have created designated Provider Care Teams.  These Care Teams include your primary Cardiologist (physician) and Advanced Practice Providers (APPs -  Physician Assistants and Nurse Practitioners) who all work together to provide you with the care you need, when you need it.  We recommend signing up for the patient portal called "MyChart".  Sign up information is provided on this After Visit Summary.  MyChart is used to connect with patients for Virtual Visits (Telemedicine).  Patients are able to view lab/test results, encounter notes, upcoming appointments, etc.  Non-urgent messages can be sent to your provider as well.   To learn more about what you can do with MyChart, go to [email or web address].    Your next appointment:   2 month(s)  The format for your next appointment:   In Person  Provider:   Dina Rich, MD   Other Instructions Thank you for choosing [facility]!   NO CHEWING TOBACCO   Two Gram Sodium Diet 2000 mg  What is Sodium? Sodium  is a mineral found naturally in many foods. The most significant source of sodium in the diet is table salt, which is about 40% sodium.  Processed, convenience, and preserved foods also contain a large amount of sodium.  The body needs only 500 mg of sodium daily to function,  A normal diet provides more than enough sodium even if you do not use salt.  Why Limit Sodium? A build up of sodium in the body can cause thirst, increased blood pressure, shortness of breath, and water retention.  Decreasing sodium in the diet can reduce edema and risk of heart attack or stroke associated with high blood pressure.  Keep in mind that there are many other factors involved in these health problems.  Heredity, obesity, lack of exercise, cigarette smoking, stress and what you eat all play a role.  General Guidelines:  Do not add salt at the table or in cooking.  One teaspoon of salt contains over 2 grams of sodium.  Read food labels  Avoid processed and convenience foods  Ask your dietitian before eating any foods not dicussed in the menu planning guidelines  Consult your physician if you wish to use a salt substitute or a sodium containing medication such as antacids.  Limit milk and milk products to 16 oz (2 cups) per day.  Shopping Hints:  READ LABELS!! "Dietetic" does not necessarily mean low sodium.  Salt and other sodium ingredients are often added to foods during processing.   Menu Planning  Guidelines Food Group Choose More Often Avoid  Beverages (see also the milk group All fruit juices, low-sodium, salt-free vegetables juices, low-sodium carbonated beverages Regular vegetable or tomato juices, commercially softened water used for drinking or cooking  Breads and Cereals Enriched white, wheat, rye and pumpernickel bread, hard rolls and dinner rolls; muffins, cornbread and waffles; most dry cereals, cooked cereal without added salt; unsalted crackers and breadsticks; low sodium or homemade bread  crumbs Bread, rolls and crackers with salted tops; quick breads; instant hot cereals; pancakes; commercial bread stuffing; self-rising flower and biscuit mixes; regular bread crumbs or cracker crumbs  Desserts and Sweets Desserts and sweets mad with mild should be within allowance Instant pudding mixes and cake mixes  Fats Butter or margarine; vegetable oils; unsalted salad dressings, regular salad dressings limited to 1 Tbs; light, sour and heavy cream Regular salad dressings containing bacon fat, bacon bits, and salt pork; snack dips made with instant soup mixes or processed cheese; salted nuts  Fruits Most fresh, frozen and canned fruits Fruits processed with salt or sodium-containing ingredient (some dried fruits are processed with sodium sulfites        Vegetables Fresh, frozen vegetables and low- sodium canned vegetables Regular canned vegetables, sauerkraut, pickled vegetables, and others prepared in brine; frozen vegetables in sauces; vegetables seasoned with ham, bacon or salt pork  Condiments, Sauces, Miscellaneous  Salt substitute with physician's approval; pepper, herbs, spices; vinegar, lemon or lime juice; hot pepper sauce; garlic powder, onion powder, low sodium soy sauce (1 Tbs.); low sodium condiments (ketchup, chili sauce, mustard) in limited amounts (1 tsp.) fresh ground horseradish; unsalted tortilla chips, pretzels, potato chips, popcorn, salsa (1/4 cup) Any seasoning made with salt including garlic salt, celery salt, onion salt, and seasoned salt; sea salt, rock salt, kosher salt; meat tenderizers; monosodium glutamate; mustard, regular soy sauce, barbecue, sauce, chili sauce, teriyaki sauce, steak sauce, Worcestershire sauce, and most flavored vinegars; canned gravy and mixes; regular condiments; salted snack foods, olives, picles, relish, horseradish sauce, catsup   Food preparation: Try these seasonings Meats:    Pork Sage, onion Serve with applesauce  Chicken Poultry  seasoning, thyme, parsley Serve with cranberry sauce  Lamb Curry powder, rosemary, garlic, thyme Serve with mint sauce or jelly  Veal Marjoram, basil Serve with current jelly, cranberry sauce  Beef Pepper, bay leaf Serve with dry mustard, unsalted chive butter  Fish [place] leaf, dill Serve with unsalted lemon butter, unsalted parsley butter  Vegetables:    Asparagus Lemon juice   Broccoli Lemon juice   Carrots Mustard dressing parsley, mint, nutmeg, glazed with unsalted butter and sugar   Green beans Marjoram, lemon juice, nutmeg,dill seed   Tomatoes Basil, marjoram, onion   Spice /blend for [employer]" 4 tsp ground thyme 1 tsp ground sage 3 tsp ground rosemary 4 tsp ground marjoram   Test your knowledge 1. A product that says "Salt Free" may still contain sodium. True or False 2. Garlic Powder and Hot Pepper Sauce an be used as alternative seasonings.True or False 3. Processed foods have more sodium than fresh foods.  True or False 4. Canned Vegetables have less sodium than froze True or False  WAYS TO DECREASE YOUR SODIUM INTAKE 1. Avoid the use of added salt in cooking and at the table.  Table salt (and other prepared seasonings which contain salt) is probably one of the greatest sources of sodium in the diet.  Unsalted foods can gain flavor from the sweet, sour, and butter taste sensations of  herbs and spices.  Instead of using salt for seasoning, try the following seasonings with the foods listed.  Remember: how you use them to enhance natural food flavors is limited only by your creativity... Allspice-Meat, fish, eggs, fruit, peas, red and yellow vegetables Almond Extract-Fruit baked goods Anise Seed-Sweet breads, fruit, carrots, beets, cottage cheese, cookies (tastes like licorice) Basil-Meat, fish, eggs, vegetables, rice, vegetables salads, soups, sauces Bay Leaf-Meat, fish, stews, poultry Burnet-Salad, vegetables (cucumber-like flavor) Caraway Seed-Bread, cookies, cottage cheese,  meat, vegetables, cheese, rice Cardamon-Baked goods, fruit, soups Celery Powder or seed-Salads, salad dressings, sauces, meatloaf, soup, bread.Do not use  celery salt Chervil-Meats, salads, fish, eggs, vegetables, cottage cheese (parsley-like flavor) Chili Power-Meatloaf, chicken cheese, corn, eggplant, egg dishes Chives-Salads cottage cheese, egg dishes, soups, vegetables, sauces Cilantro-Salsa, casseroles Cinnamon-Baked goods, fruit, pork, lamb, chicken, carrots Cloves-Fruit, baked goods, fish, pot roast, green beans, beets, carrots Coriander-Pastry, cookies, meat, salads, cheese (lemon-orange flavor) Cumin-Meatloaf, fish,cheese, eggs, cabbage,fruit pie (caraway flavor) [employer]Curry Powder-Meat, fruit, eggs, fish, poultry, cottage cheese, vegetables Dill Seed-Meat, cottage cheese, poultry, vegetables, fish, salads, bread Fennel Seed-Bread, cookies, apples, pork, eggs, fish, beets, cabbage, cheese, Licorice-like flavor Garlic-(buds or powder) Salads, meat, poultry, fish, bread, butter, vegetables, potatoes.Do not  use garlic salt Ginger-Fruit, vegetables, baked goods, meat, fish, poultry Horseradish Root-Meet, vegetables, butter Lemon Juice or Extract-Vegetables, fruit, tea, baked goods, fish salads Mace-Baked goods fruit, vegetables, fish, poultry (taste like nutmeg) Maple Extract-Syrups Marjoram-Meat, chicken, fish, vegetables, breads, green salads (taste like Sage) Mint-Tea, lamb, sherbet, vegetables, desserts, carrots, cabbage Mustard, Dry or Seed-Cheese, eggs, meats, vegetables, poultry Nutmeg-Baked goods, fruit, chicken, eggs, vegetables, desserts Onion Powder-Meat, fish, poultry, vegetables, cheese, eggs, bread, rice salads (Do not use   Onion salt) Orange Extract-Desserts, baked goods Oregano-Pasta, eggs, cheese, onions, pork, lamb, fish, chicken, vegetables, green salads Paprika-Meat, fish, poultry, eggs, cheese, vegetables Parsley Flakes-Butter, vegetables, meat fish, poultry, eggs,  bread, salads (certain forms may   Contain sodium Pepper-Meat fish, poultry, vegetables, eggs Peppermint Extract-Desserts, baked goods Poppy Seed-Eggs, bread, cheese, fruit dressings, baked goods, noodles, vegetables, cottage  [employer] RxCheese Poultry Seasoning-Poultry,veal Rosemary-Lamb, poultry, meat, fish, cauliflower, turnips,eggs bread Saffron-Rice, bread, veal, chicken, fish, eggs Sage-Meat, fish, poultry, onions, eggplant, tomateos, pork, stews Savory-Eggs, salads, poultry, meat, rice, vegetables, soups, pork Tarragon-Meat, poultry, fish, eggs, butter, vegetables (licorice-like flavor)  Thyme-Meat, poultry, fish, eggs, vegetables, (clover-like flavor), sauces, soups Tumeric-Salads, butter, eggs, fish, rice, vegetables (saffron-like flavor) Vanilla Extract-Baked goods, candy Vinegar-Salads, vegetables, meat marinades Walnut Extract-baked goods, candy  2. Choose your Foods Wisely   The following is a list of foods to avoid which are high in sodium:  Meats-Avoid all smoked, canned, salt cured, dried and kosher meat and fish as well as Anchovies   Lox F[employer] SemiconductorBacon    Luncheon meats:Bologna, Liverwurst, Pastrami Canned meat or fish  Marinated herring Caviar    Pepperoni Corned Beef   Pizza Dried chipped beef  Salami Frozen breaded fish or meat Salt pork Frankfurters or hot dogs  Sardines Gefilte fish   Sausage Ham (boiled ham, Proscuitto Smoked butt    spiced ham)   Spam      TV Dinners Vegetables Canned vegetables (Regular) Relish Canned mushrooms  Sauerkraut Olives    Tomato juice Pickles  Bakery and Dessert Products Canned puddings  Cream pies Cheesecake   Decorated cakes Cookies  Beverages/Juices Tomato juice, regular  Gatorade   V-8 vegetable juice, regular  Breads and Cereals Biscuit mixes   Salted potato chips, corn chips, pretzels Bread stuffing mixes  Salted crackers and rolls Pancake  and waffle mixes Self-rising flour  Seasonings Accent    Meat sauces Barbecue  sauce  Meat tenderizer Catsup    Monosodium glutamate (MSG) Celery salt   Onion salt Chili sauce   Prepared mustard Garlic salt   Salt, seasoned salt, sea salt Gravy mixes   Soy sauce Horseradish   Steak sauce Ketchup   Tartar sauce Lite salt    Teriyaki sauce Marinade mixes   Worcestershire sauce  Others Baking powder   Cocoa and cocoa mixes Baking soda   Commercial casserole mixes Candy-caramels, chocolate  Dehydrated soups    Bars, fudge,nougats  Instant rice and pasta mixes Canned broth or soup  Maraschino cherries Cheese, aged and processed cheese and cheese spreads  Learning Assessment Quiz  Indicated T (for True) or F (for False) for each of the following statements:  1. _____ Fresh fruits and vegetables and unprocessed grains are generally low in sodium 2. _____ Water may contain a considerable amount of sodium, depending on the source 3. _____ You can always tell if a food is high in sodium by tasting it 4. _____ Certain laxatives my be high in sodium and should be avoided unless prescribed   by a physician or pharmacist 5. _____ Salt substitutes may be used freely by anyone on a sodium restricted diet 6. _____ Sodium is present in table salt, food additives and as a natural component of   most foods 7. _____ Table salt is approximately 90% sodium 8. _____ Limiting sodium intake may help prevent excess fluid accumulation in the body 9. _____ On a sodium-restricted diet, seasonings such as bouillon soy sauce, and    cooking wine should be used in place of table salt 10. _____ On an ingredient list, a product which lists monosodium glutamate as the first   ingredient is an appropriate food to include on a low sodium diet  Circle the best answer(s) to the following statements (Hint: there may be more than one correct answer)  11. On a low-sodium diet, some acceptable snack items are:    A. Olives  F. Bean dip   K. Grapefruit juice    B. Salted Pretzels G. Commercial  Popcorn   L. Canned peaches    C. Carrot Sticks  H. Bouillon   M. Unsalted nuts   D. [place] fries  I. Peanut butter crackers N. Salami   E. Sweet pickles J. Tomato Juice   O. Pizza  12.  Seasonings that may be used freely on a reduced - sodium diet include   A. Lemon wedges F.Monosodium glutamate K. Celery seed    B.Soysauce   G. Pepper   L. Mustard powder   C. Sea salt  H. Cooking wine  M. Onion flakes   D. Vinegar  E. Prepared horseradish N. Salsa   E. Sage   J. Worcestershire sauce  O. Chutney

## 2019-11-26 ENCOUNTER — Other Ambulatory Visit: Payer: Self-pay | Admitting: Interventional Cardiology

## 2019-11-26 NOTE — Telephone Encounter (Signed)
Pt last saw Jacolyn Reedy, PA on 11/09/19, last labs 10/15/19 Creat 1.77, age 79, weight 85.7kg, based on specified criteria pt is on appropriate dosage of Eliquis 5mg  BID.  Will refill rx.

## 2019-12-01 ENCOUNTER — Telehealth: Payer: Self-pay | Admitting: Internal Medicine

## 2019-12-01 NOTE — Telephone Encounter (Signed)
I will defer to Dr.Smith regarding Crestor 40 mg dose.

## 2019-12-01 NOTE — Telephone Encounter (Signed)
Stop Crestor for 1 month, then resume taking 40 mg on M and Friday only.

## 2019-12-01 NOTE — Telephone Encounter (Signed)
New message    Pt c/o medication issue:  1. Name of Medication: crestor   2. How are you currently taking this medication (dosage and times per day)? As prescribed   3. Are you having a reaction (difficulty breathing--STAT)? Yes per daughter   63. What is your medication issue?  Patient is having extreme fatigue since starting the crestor - they went over to see patient and she was crying saying she is really tired . Her bp is 110/59 hr 61 but they were unable to obtain oxygen level . Please call and advise what to do

## 2019-12-02 ENCOUNTER — Other Ambulatory Visit: Payer: Self-pay | Admitting: Physician Assistant

## 2019-12-02 MED ORDER — ROSUVASTATIN CALCIUM 40 MG PO TABS: ORAL_TABLET | ORAL | 1 refills | Status: DC

## 2019-12-02 NOTE — Telephone Encounter (Signed)
Spoke with daughter and made her aware of recommendations.  Daughter appreciative for call.  

## 2019-12-03 LAB — LIPID PANEL W/O CHOL/HDL RATIO
Cholesterol, Total: 106 mg/dL (ref 100–199)
HDL: 31 mg/dL — ABNORMAL LOW (ref >39)
LDL Chol Calc (NIH): 50 mg/dL (ref 0–99)
Triglycerides: 140 mg/dL (ref 0–149)
VLDL Cholesterol Cal: 25 mg/dL (ref 5–40)

## 2019-12-08 ENCOUNTER — Encounter: Payer: Self-pay | Admitting: Internal Medicine

## 2019-12-08 ENCOUNTER — Encounter: Payer: Self-pay | Admitting: *Deleted

## 2019-12-08 ENCOUNTER — Other Ambulatory Visit (HOSPITAL_COMMUNITY)
Admission: RE | Admit: 2019-12-08 | Discharge: 2019-12-08 | Disposition: A | Discharge status: Home / Self Care | Payer: Medicare Other | Source: Ambulatory Visit | Attending: Internal Medicine | Admitting: Internal Medicine

## 2019-12-08 ENCOUNTER — Ambulatory Visit (INDEPENDENT_AMBULATORY_CARE_PROVIDER_SITE_OTHER): Payer: Medicare Other | Admitting: Internal Medicine

## 2019-12-08 ENCOUNTER — Other Ambulatory Visit: Payer: Self-pay

## 2019-12-08 VITALS — BP 118/64 | HR 64 | Ht 62.0 in | Wt 192.0 lb

## 2019-12-08 DIAGNOSIS — I2510 Atherosclerotic heart disease of native coronary artery without angina pectoris: Secondary | ICD-10-CM

## 2019-12-08 DIAGNOSIS — I255 Ischemic cardiomyopathy: Secondary | ICD-10-CM

## 2019-12-08 LAB — BASIC METABOLIC PANEL
Anion gap: 9 (ref 5–15)
BUN: 16 mg/dL (ref 8–23)
CO2: 26 mmol/L (ref 22–32)
Calcium: 9.6 mg/dL (ref 8.9–10.3)
Chloride: 103 mmol/L (ref 98–111)
Creatinine, Ser: 1.98 mg/dL — ABNORMAL HIGH (ref 0.44–1.00)
GFR calc Af Amer: 27 mL/min — ABNORMAL LOW (ref >60)
GFR calc non Af Amer: 24 mL/min — ABNORMAL LOW (ref >60)
Glucose, Bld: 164 mg/dL — ABNORMAL HIGH (ref 70–99)
Potassium: 4.4 mmol/L (ref 3.5–5.1)
Sodium: 138 mmol/L (ref 135–145)

## 2019-12-08 LAB — CBC
HCT: 35.5 % — ABNORMAL LOW (ref 36.0–46.0)
Hemoglobin: 10.7 g/dL — ABNORMAL LOW (ref 12.0–15.0)
MCH: 29.9 pg (ref 26.0–34.0)
MCHC: 30.1 g/dL (ref 30.0–36.0)
MCV: 99.2 fL (ref 80.0–100.0)
Platelets: 215 10*3/uL (ref 150–400)
RBC: 3.58 MIL/uL — ABNORMAL LOW (ref 3.87–5.11)
RDW: 15.9 % — ABNORMAL HIGH (ref 11.5–15.5)
WBC: 9.1 10*3/uL (ref 4.0–10.5)
nRBC: 0 % (ref 0.0–0.2)

## 2019-12-08 NOTE — Progress Notes (Signed)
     HPI Terry Hanson is referred today by Dr. Smith for evaluation of chronic systolic heart failure and an ICM for conisderation of ICD insertion for primary prevention. She is a pleasant morbidly obese 79 yo woman with an anterior MI, s/p late presentation, s/p PCI. She has been on maximal medical therapy. She initially wore a life vest. She has not had syncope.       Allergies  Allergen Reactions  . Codeine Hives  . Penicillins Hives  . Shrimp [Shellfish Allergy]     Broke out  . Sulfa Antibiotics Itching  . Penicillins Itching and Rash    Did it involve swelling of the face/tongue/throat, SOB, or low BP? Yes Did it involve sudden or severe rash/hives, skin peeling, or any reaction on the inside of your mouth or nose? Yes Did you need to seek medical attention at a hospital or doctor's office? Yes When did it last happen? Over 10 years If all above answers are "NO", may proceed with cephalosporin use.            Current Outpatient Medications  Medication Sig Dispense Refill  . clopidogrel (PLAVIX) 75 MG tablet Take 75 mg by mouth daily.    . dapagliflozin propanediol (FARXIGA) 5 MG TABS tablet Take 1 tablet (5 mg total) by mouth daily before breakfast. 30 tablet 11  . digoxin (LANOXIN) 0.125 MG tablet Take 1 tablet (0.125 mg total) by mouth 2 (two) times a week. 30 tablet 3  . ELIQUIS 5 MG TABS tablet TAKE 1 TABLET BY MOUTH TWICE DAILY 60 tablet 5  . furosemide (LASIX) 40 MG tablet Take 0.5 tablets (20 mg total) by mouth daily. May take a whole pill for 2-3 pound weight gain over night, shortness of breath or edema. 45 tablet 3  . levothyroxine (SYNTHROID) 25 MCG tablet Take 1 tablet (25 mcg total) by mouth daily before breakfast. 90 tablet 2  . metoprolol succinate (TOPROL-XL) 200 MG 24 hr tablet Take 1 tablet (200 mg total) by mouth daily. Take with or immediately following a meal. 30 tablet 11  . nitroGLYCERIN (NITROSTAT) 0.4 MG SL tablet Place 1 tablet  (0.4 mg total) under the tongue every 5 (five) minutes as needed. 25 tablet 2  . potassium chloride (KLOR-CON) 10 MEQ tablet Take 1 tablet (10 mEq total) by mouth daily. May take 20 mg for 2-3 pound weight gain overnight, shortness of breath, edema 120 tablet 3  . rosuvastatin (CRESTOR) 40 MG tablet Take one tablet by mouth on Monday  And Friday 25 tablet 1  . sacubitril-valsartan (ENTRESTO) 24-26 MG Take 1 tablet by mouth 2 (two) times daily. 60 tablet 11  . spironolactone (ALDACTONE) 25 MG tablet Take 0.5 tablets (12.5 mg total) by mouth every Monday, Wednesday, and Friday. 20 tablet 3  . vitamin B-12 (CYANOCOBALAMIN) 500 MCG tablet Take 1 tablet (500 mcg total) by mouth daily.     No current facility-administered medications for this visit.         Past Medical History:  Diagnosis Date  . Arthritis   . Asthma   . Diabetes mellitus (HCC)   . Diabetes mellitus without complication (HCC)   . Enlarged heart   . Hypercholesterolemia   . Hyperlipidemia   . Ischemic cardiomyopathy    a. EF 30-35% by echo in 06/2019  . STEMI (ST elevation myocardial infarction) (HCC)    a. 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    ROS:   All   systems reviewed and negative except as noted in the HPI.        Past Surgical History:  Procedure Laterality Date  . ABDOMINAL HYSTERECTOMY    . CORONARY/GRAFT ACUTE MI REVASCULARIZATION N/A 06/29/2019   Procedure: Coronary/Graft Acute MI Revascularization;  Surgeon: Smith, Henry W, MD;  Location: [facility];  Service: Cardiovascular;  Laterality: N/A;  . LEFT HEART CATH AND CORONARY ANGIOGRAPHY N/A 06/29/2019   Procedure: LEFT HEART CATH AND CORONARY ANGIOGRAPHY;  Surgeon: Smith, Henry W, MD;  Location: [facility];  Service: Cardiovascular;  Laterality: N/A;          Family History  Problem Relation Age of Onset  . Heart disease Mother      Social History        Socioeconomic History  .  Marital status: Married    Spouse name: Not on file  . Number of children: Not on file  . Years of education: Not on file  . Highest education level: Not on file  Occupational History  . Not on file  Tobacco Use  . Smoking status: Never Smoker  . Smokeless tobacco: Former User    Types: Chew  Vaping Use  . Vaping Use: Never used  Substance and Sexual Activity  . Alcohol use: Not Currently  . Drug use: Never  . Sexual activity: Not on file  Other Topics Concern  . Not on file  Social History Narrative   ** Merged History Encounter **       Social Determinants of Health   Financial Resource Strain:   . Difficulty of Paying Living Expenses: Not on file  Food Insecurity:   . Worried About Running Out of Food in the Last Year: Not on file  . Ran Out of Food in the Last Year: Not on file  Transportation Needs:   . Lack of Transportation (Medical): Not on file  . Lack of Transportation (Non-Medical): Not on file  Physical Activity:   . Days of Exercise per Week: Not on file  . Minutes of Exercise per Session: Not on file  Stress:   . Feeling of Stress : Not on file  Social Connections:   . Frequency of Communication with Friends and Family: Not on file  . Frequency of Social Gatherings with Friends and Family: Not on file  . Attends Religious Services: Not on file  . Active Member of Clubs or Organizations: Not on file  . Attends Club or Organization Meetings: Not on file  . Marital Status: Not on file  Intimate Partner Violence:   . Fear of Current or Ex-Partner: Not on file  . Emotionally Abused: Not on file  . Physically Abused: Not on file  . Sexually Abused: Not on file     BP 118/64   Pulse 64   Ht 5' 2" (1.575 m)   Wt 192 lb (87.1 kg)   SpO2 97%   BMI 35.12 kg/m   Physical Exam:  Well appearing NAD HEENT: Unremarkable Neck:  6 cm JVD, no thyromegally Lymphatics:  No adenopathy Back:  No CVA tenderness Lungs:  Clear with no  wheezes HEART:  Regular rate rhythm, no murmurs, no rubs, no clicks Abd:  soft, positive bowel sounds, no organomegally, no rebound, no guarding Ext:  2 plus pulses, no edema, no cyanosis, no clubbing Skin:  No rashes no nodules Neuro:  CN II through XII intact, motor grossly intact  EKG - reviewed. Atrial fib with a CVR/RVR  Assess/Plan:   1. ICM - she denies anginal symptoms. She will continue her current meds. 2. Chronic systolic heart failure - Her symptoms are class 2. She will continue her current meds. 3. Obesity -she will need to work on weight loss.  4. ICD - I discussed the indications/risks/benefits/goals/expectations of ICD insertion and she would like to proceed.  Gaetana Kawahara,MD  

## 2019-12-08 NOTE — Patient Instructions (Signed)
Medication Instructions:  Your physician recommends that you continue on your current medications as directed. Please refer to the Current Medication list given to you today.   *If you need a refill on your cardiac medications before your next appointment, please call your pharmacy*   Lab Work: Your physician recommends that you return for lab work in: Today   If you have labs (blood work) drawn today and your tests are completely normal, you will receive your results only by: Marland Kitchen MyChart Message (if you have MyChart) OR . A paper copy in the mail If you have any lab test that is abnormal or we need to change your treatment, we will call you to review the results.   Testing/Procedures: Your physician has recommended that you have a defibrillator inserted. An implantable cardioverter defibrillator (ICD) is a small device that is placed in your chest or, in rare cases, your abdomen. This device uses electrical pulses or shocks to help control life-threatening, irregular heartbeats that could lead the heart to suddenly stop beating (sudden cardiac arrest). Leads are attached to the ICD that goes into your heart. This is done in the hospital and usually requires an overnight stay. Please see the instruction sheet given to you today for more information.    Follow-Up: At [facility], you and your health needs are our priority.  As part of our continuing mission to provide you with exceptional heart care, we have created designated Provider Care Teams.  These Care Teams include your primary Cardiologist (physician) and Advanced Practice Providers (APPs -  Physician Assistants and Nurse Practitioners) who all work together to provide you with the care you need, when you need it.  We recommend signing up for the patient portal called "MyChart".  Sign up information is provided on this After Visit Summary.  MyChart is used to connect with patients for Virtual Visits (Telemedicine).  Patients are able  to view lab/test results, encounter notes, upcoming appointments, etc.  Non-urgent messages can be sent to your provider as well.   To learn more about what you can do with MyChart, go to [email or web address].    Your next appointment:   3 month(s)  The format for your next appointment:   In Person  Provider:   Lewayne Bunting, MD   Other Instructions Thank you for choosing [facility]!

## 2019-12-11 ENCOUNTER — Other Ambulatory Visit (HOSPITAL_COMMUNITY)
Admission: RE | Admit: 2019-12-11 | Discharge: 2019-12-11 | Disposition: A | Discharge status: Home / Self Care | Payer: Medicare Other | Source: Ambulatory Visit | Attending: Internal Medicine | Admitting: Internal Medicine

## 2019-12-11 DIAGNOSIS — Z01812 Encounter for preprocedural laboratory examination: Secondary | ICD-10-CM | POA: Insufficient documentation

## 2019-12-11 DIAGNOSIS — Z20822 Contact with and (suspected) exposure to covid-19: Secondary | ICD-10-CM | POA: Insufficient documentation

## 2019-12-11 LAB — SARS CORONAVIRUS 2 (TAT 6-24 HRS): SARS Coronavirus 2: NEGATIVE

## 2019-12-14 ENCOUNTER — Ambulatory Visit (HOSPITAL_COMMUNITY)
Admission: RE | Admit: 2019-12-14 | Discharge: 2019-12-14 | Disposition: A | Discharge status: Home / Self Care | Payer: Medicare Other | Attending: Internal Medicine | Admitting: Internal Medicine

## 2019-12-14 ENCOUNTER — Telehealth: Payer: Self-pay | Admitting: *Deleted

## 2019-12-14 ENCOUNTER — Ambulatory Visit (HOSPITAL_COMMUNITY): Payer: Medicare Other

## 2019-12-14 ENCOUNTER — Ambulatory Visit (HOSPITAL_COMMUNITY)
Admission: RE | Disposition: A | Discharge status: Home / Self Care | Payer: Medicare Other | Source: Home / Self Care | Attending: Internal Medicine

## 2019-12-14 ENCOUNTER — Other Ambulatory Visit: Payer: Self-pay

## 2019-12-14 DIAGNOSIS — Z7902 Long term (current) use of antithrombotics/antiplatelets: Secondary | ICD-10-CM | POA: Insufficient documentation

## 2019-12-14 DIAGNOSIS — E785 Hyperlipidemia, unspecified: Secondary | ICD-10-CM | POA: Insufficient documentation

## 2019-12-14 DIAGNOSIS — J45909 Unspecified asthma, uncomplicated: Secondary | ICD-10-CM | POA: Insufficient documentation

## 2019-12-14 DIAGNOSIS — E119 Type 2 diabetes mellitus without complications: Secondary | ICD-10-CM | POA: Insufficient documentation

## 2019-12-14 DIAGNOSIS — Z87891 Personal history of nicotine dependence: Secondary | ICD-10-CM | POA: Insufficient documentation

## 2019-12-14 DIAGNOSIS — Z6841 Body Mass Index (BMI) 40.0 and over, adult: Secondary | ICD-10-CM | POA: Diagnosis not present

## 2019-12-14 DIAGNOSIS — I5022 Chronic systolic (congestive) heart failure: Secondary | ICD-10-CM | POA: Insufficient documentation

## 2019-12-14 DIAGNOSIS — Z7984 Long term (current) use of oral hypoglycemic drugs: Secondary | ICD-10-CM | POA: Diagnosis not present

## 2019-12-14 DIAGNOSIS — Z79899 Other long term (current) drug therapy: Secondary | ICD-10-CM | POA: Diagnosis not present

## 2019-12-14 DIAGNOSIS — Z882 Allergy status to sulfonamides status: Secondary | ICD-10-CM | POA: Diagnosis not present

## 2019-12-14 DIAGNOSIS — Z885 Allergy status to narcotic agent status: Secondary | ICD-10-CM | POA: Diagnosis not present

## 2019-12-14 DIAGNOSIS — Z006 Encounter for examination for normal comparison and control in clinical research program: Secondary | ICD-10-CM | POA: Diagnosis not present

## 2019-12-14 DIAGNOSIS — Z79890 Hormone replacement therapy (postmenopausal): Secondary | ICD-10-CM | POA: Diagnosis not present

## 2019-12-14 DIAGNOSIS — I255 Ischemic cardiomyopathy: Secondary | ICD-10-CM | POA: Insufficient documentation

## 2019-12-14 DIAGNOSIS — E7800 Pure hypercholesterolemia, unspecified: Secondary | ICD-10-CM | POA: Insufficient documentation

## 2019-12-14 DIAGNOSIS — Z7901 Long term (current) use of anticoagulants: Secondary | ICD-10-CM | POA: Diagnosis not present

## 2019-12-14 DIAGNOSIS — Z955 Presence of coronary angioplasty implant and graft: Secondary | ICD-10-CM | POA: Diagnosis not present

## 2019-12-14 DIAGNOSIS — I252 Old myocardial infarction: Secondary | ICD-10-CM | POA: Insufficient documentation

## 2019-12-14 DIAGNOSIS — Z880 Allergy status to penicillin: Secondary | ICD-10-CM | POA: Diagnosis not present

## 2019-12-14 DIAGNOSIS — Z95810 Presence of automatic (implantable) cardiac defibrillator: Secondary | ICD-10-CM

## 2019-12-14 HISTORY — PX: ICD IMPLANT: EP1208

## 2019-12-14 LAB — GLUCOSE, CAPILLARY: Glucose-Capillary: 144 mg/dL — ABNORMAL HIGH (ref 70–99)

## 2019-12-14 SURGERY — ICD IMPLANT

## 2019-12-14 MED ORDER — SODIUM CHLORIDE 0.9 % IV SOLN
INTRAVENOUS | Status: AC
  Filled 2019-12-14: qty 2

## 2019-12-14 MED ORDER — HEPARIN (PORCINE) IN NACL 1000-0.9 UT/500ML-% IV SOLN
INTRAVENOUS | Status: AC
  Filled 2019-12-14: qty 500

## 2019-12-14 MED ORDER — VANCOMYCIN HCL IN DEXTROSE 1-5 GM/200ML-% IV SOLN
INTRAVENOUS | Status: AC
  Filled 2019-12-14: qty 200

## 2019-12-14 MED ORDER — SODIUM CHLORIDE 0.9 % IV SOLN: INTRAVENOUS | Status: DC

## 2019-12-14 MED ORDER — FENTANYL CITRATE (PF) 100 MCG/2ML IJ SOLN
INTRAMUSCULAR | Status: AC
  Filled 2019-12-14: qty 2

## 2019-12-14 MED ORDER — METHYLPREDNISOLONE SODIUM SUCC 125 MG IJ SOLR
INTRAMUSCULAR | Status: DC | PRN
  Administered 2019-12-14: 52.5 mg via INTRAVENOUS

## 2019-12-14 MED ORDER — VANCOMYCIN HCL IN DEXTROSE 1-5 GM/200ML-% IV SOLN: 1000.0000 mg | Freq: Once | INTRAVENOUS | Status: DC

## 2019-12-14 MED ORDER — CHLORHEXIDINE GLUCONATE 4 % EX LIQD: 4.0000 "application " | Freq: Once | CUTANEOUS | Status: DC

## 2019-12-14 MED ORDER — METHYLPREDNISOLONE SODIUM SUCC 125 MG IJ SOLR
INTRAMUSCULAR | Status: AC
  Filled 2019-12-14: qty 2

## 2019-12-14 MED ORDER — MIDAZOLAM HCL 5 MG/5ML IJ SOLN
INTRAMUSCULAR | Status: DC | PRN
  Administered 2019-12-14: 2 mg via INTRAVENOUS
  Administered 2019-12-14: 1 mg via INTRAVENOUS

## 2019-12-14 MED ORDER — ACETAMINOPHEN 325 MG PO TABS: 325.0000 mg | ORAL_TABLET | ORAL | Status: DC | PRN

## 2019-12-14 MED ORDER — FENTANYL CITRATE (PF) 100 MCG/2ML IJ SOLN
INTRAMUSCULAR | Status: DC | PRN
Start: 2019-12-14 — End: 2019-12-14
  Administered 2019-12-14: 25 ug via INTRAVENOUS
  Administered 2019-12-14: 12.5 ug via INTRAVENOUS

## 2019-12-14 MED ORDER — LIDOCAINE HCL 1 % IJ SOLN
INTRAMUSCULAR | Status: AC
  Filled 2019-12-14: qty 60

## 2019-12-14 MED ORDER — ONDANSETRON HCL 4 MG/2ML IJ SOLN: 4.0000 mg | Freq: Four times a day (QID) | INTRAMUSCULAR | Status: DC | PRN

## 2019-12-14 MED ORDER — VANCOMYCIN HCL IN DEXTROSE 1-5 GM/200ML-% IV SOLN
1000.0000 mg | INTRAVENOUS | Status: AC
  Administered 2019-12-14: 1000 mg via INTRAVENOUS

## 2019-12-14 MED ORDER — HEPARIN (PORCINE) IN NACL 1000-0.9 UT/500ML-% IV SOLN
INTRAVENOUS | Status: DC | PRN
  Administered 2019-12-14: 500 mL

## 2019-12-14 MED ORDER — LIDOCAINE HCL (PF) 1 % IJ SOLN
INTRAMUSCULAR | Status: DC | PRN
  Administered 2019-12-14: 60 mL

## 2019-12-14 MED ORDER — MIDAZOLAM HCL 5 MG/5ML IJ SOLN
INTRAMUSCULAR | Status: AC
  Filled 2019-12-14: qty 5

## 2019-12-14 MED ORDER — SODIUM CHLORIDE 0.9 % IV SOLN
80.0000 mg | INTRAVENOUS | Status: AC
  Administered 2019-12-14: 80 mg

## 2019-12-14 SURGICAL SUPPLY — 7 items
CABLE SURGICAL S-101-97-12 (CABLE) ×3 IMPLANT
ICD GALLANT VR CDVRA500Q (ICD Generator) ×2 IMPLANT
LEAD DURATA 7122Q-58CM (Lead) ×2 IMPLANT
PAD PRO RADIOLUCENT 2001M-C (PAD) ×3 IMPLANT
SHEATH 7FR PRELUDE SNAP 13 (SHEATH) ×2 IMPLANT
SHEATH PROBE COVER 6X72 (BAG) ×2 IMPLANT
TRAY PACEMAKER INSERTION (PACKS) ×3 IMPLANT

## 2019-12-14 NOTE — Discharge Instructions (Signed)
° ° °  Supplemental Discharge Instructions for  Pacemaker/Defibrillator Patients  Tomorrow, 12/16/2019, send in a device transmission  Activity No heavy lifting or vigorous activity with your left/right arm for 6 to 8 weeks.  Do not raise your left/right arm above your head for one week.  Gradually raise your affected arm as drawn below.             12/18/2019                 12/19/2019                12/20/2019              12/21/2019 __  NO DRIVING for 1 week  ; you may begin driving on  24/04/3534   .  WOUND CARE - Keep the wound area clean and dry.  Do not get this area wet , no showers for one week; you may shower on 12/21/2019  . - Tomorrow, 12/16/2019, remove the arm sling - Roe Coombs not remove the current dressing, you have an appointment with one of Dr. Dallas Breeding on Wed, 9/29, to remove this dressing and provide further wound care instructions  If you notice any drainage or discharge from the wound, any swelling or bruising at the site, or you develop a fever > 101? F after you are discharged home, call the office at once.  Special Instructions - You are still able to use cellular telephones; use the ear opposite the side where you have your pacemaker/defibrillator.  Avoid carrying your cellular phone near your device. - When traveling through airports, show security personnel your identification card to avoid being screened in the metal detectors.  Ask the security personnel to use the hand wand. - Avoid arc welding equipment, MRI testing (magnetic resonance imaging), TENS units (transcutaneous nerve stimulators).  Call the office for questions about other devices. - Avoid electrical appliances that are in poor condition or are not properly grounded. - Microwave ovens are safe to be near or to operate.  Additional information for defibrillator patients should your device go off: - If your device goes off ONCE and you feel fine afterward, notify the device clinic nurses. - If your device  goes off ONCE and you do not feel well afterward, call 911. - If your device goes off TWICE, call 911. - If your device goes off THREE times in one day, call 911.  DO NOT DRIVE YOURSELF OR A FAMILY MEMBER WITH A DEFIBRILLATOR TO THE HOSPITAL--CALL 911.

## 2019-12-14 NOTE — Telephone Encounter (Signed)
Daughter called this morning to let us know she took her Eliquis last night.  Her last dose was at 5pm yesterday.  She has not taken today.  Discussed with Dr. Ladona Ridgel and he will still do her case this afternoon.  Daughter aware.

## 2019-12-15 ENCOUNTER — Encounter (HOSPITAL_COMMUNITY): Payer: Self-pay | Admitting: Internal Medicine

## 2019-12-15 ENCOUNTER — Telehealth: Payer: Self-pay

## 2019-12-15 ENCOUNTER — Institutional Professional Consult (permissible substitution): Payer: Medicare Other | Admitting: Internal Medicine

## 2019-12-15 MED FILL — Lidocaine HCl Local Inj 1%: INTRAMUSCULAR | Qty: 60 | Status: AC

## 2019-12-15 NOTE — H&P (Signed)
HPI Terry Hanson is referred today by Dr. Katrinka Blazing for evaluation of chronic systolic heart failure and an ICM for conisderation of ICD insertion for primary prevention. She is a pleasant morbidly obese 79 yo woman with an anterior MI, s/p late presentation, s/p PCI. She has been on maximal medical therapy. She initially wore a life vest. She has not had syncope.       Allergies  Allergen Reactions  . Codeine Hives  . Penicillins Hives  . Shrimp [Shellfish Allergy]     Broke out  . Sulfa Antibiotics Itching  . Penicillins Itching and Rash    Did it involve swelling of the face/tongue/throat, SOB, or low BP? Yes Did it involve sudden or severe rash/hives, skin peeling, or any reaction on the inside of your mouth or nose? Yes Did you need to seek medical attention at a hospital or doctor's office? Yes When did it last happen? Over 10 years If all above answers are "NO", may proceed with cephalosporin use.            Current Outpatient Medications  Medication Sig Dispense Refill  . clopidogrel (PLAVIX) 75 MG tablet Take 75 mg by mouth daily.    . dapagliflozin propanediol (FARXIGA) 5 MG TABS tablet Take 1 tablet (5 mg total) by mouth daily before breakfast. 30 tablet 11  . digoxin (LANOXIN) 0.125 MG tablet Take 1 tablet (0.125 mg total) by mouth 2 (two) times a week. 30 tablet 3  . ELIQUIS 5 MG TABS tablet TAKE 1 TABLET BY MOUTH TWICE DAILY 60 tablet 5  . furosemide (LASIX) 40 MG tablet Take 0.5 tablets (20 mg total) by mouth daily. May take a whole pill for 2-3 pound weight gain over night, shortness of breath or edema. 45 tablet 3  . levothyroxine (SYNTHROID) 25 MCG tablet Take 1 tablet (25 mcg total) by mouth daily before breakfast. 90 tablet 2  . metoprolol succinate (TOPROL-XL) 200 MG 24 hr tablet Take 1 tablet (200 mg total) by mouth daily. Take with or immediately following a meal. 30 tablet 11  . nitroGLYCERIN (NITROSTAT) 0.4 MG SL tablet Place 1 tablet  (0.4 mg total) under the tongue every 5 (five) minutes as needed. 25 tablet 2  . potassium chloride (KLOR-CON) 10 MEQ tablet Take 1 tablet (10 mEq total) by mouth daily. May take 20 mg for 2-3 pound weight gain overnight, shortness of breath, edema 120 tablet 3  . rosuvastatin (CRESTOR) 40 MG tablet Take one tablet by mouth on Monday  And Friday 25 tablet 1  . sacubitril-valsartan (ENTRESTO) 24-26 MG Take 1 tablet by mouth 2 (two) times daily. 60 tablet 11  . spironolactone (ALDACTONE) 25 MG tablet Take 0.5 tablets (12.5 mg total) by mouth every Monday, Wednesday, and Friday. 20 tablet 3  . vitamin B-12 (CYANOCOBALAMIN) 500 MCG tablet Take 1 tablet (500 mcg total) by mouth daily.     No current facility-administered medications for this visit.         Past Medical History:  Diagnosis Date  . Arthritis   . Asthma   . Diabetes mellitus (HCC)   . Diabetes mellitus without complication (HCC)   . Enlarged heart   . Hypercholesterolemia   . Hyperlipidemia   . Ischemic cardiomyopathy    a. EF 30-35% by echo in 06/2019  . STEMI (ST elevation myocardial infarction) (HCC)    a. 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    ROS:   All  systems reviewed and negative except as noted in the HPI.        Past Surgical History:  Procedure Laterality Date  . ABDOMINAL HYSTERECTOMY    . CORONARY/GRAFT ACUTE MI REVASCULARIZATION N/A 06/29/2019   Procedure: Coronary/Graft Acute MI Revascularization;  Surgeon: Lyn Records, MD;  Location: [facility] INVASIVE CV LAB;  Service: Cardiovascular;  Laterality: N/A;  . LEFT HEART CATH AND CORONARY ANGIOGRAPHY N/A 06/29/2019   Procedure: LEFT HEART CATH AND CORONARY ANGIOGRAPHY;  Surgeon: Lyn Records, MD;  Location: [facility] INVASIVE CV LAB;  Service: Cardiovascular;  Laterality: N/A;          Family History  Problem Relation Age of Onset  . Heart disease Mother      Social History        Socioeconomic History  .  Marital status: Married    Spouse name: Not on file  . Number of children: Not on file  . Years of education: Not on file  . Highest education level: Not on file  Occupational History  . Not on file  Tobacco Use  . Smoking status: Never Smoker  . Smokeless tobacco: Former Neurosurgeon    Types: Engineer, drilling  . Vaping Use: Never used  Substance and Sexual Activity  . Alcohol use: Not Currently  . Drug use: Never  . Sexual activity: Not on file  Other Topics Concern  . Not on file  Social History Narrative   ** Merged History Encounter **       Social Determinants of Health   Financial Resource Strain:   . Difficulty of Paying Living Expenses: Not on file  Food Insecurity:   . Worried About Programme researcher, broadcasting/film/video in the Last Year: Not on file  . Ran Out of Food in the Last Year: Not on file  Transportation Needs:   . Lack of Transportation (Medical): Not on file  . Lack of Transportation (Non-Medical): Not on file  Physical Activity:   . Days of Exercise per Week: Not on file  . Minutes of Exercise per Session: Not on file  Stress:   . Feeling of Stress : Not on file  Social Connections:   . Frequency of Communication with Friends and Family: Not on file  . Frequency of Social Gatherings with Friends and Family: Not on file  . Attends Religious Services: Not on file  . Active Member of Clubs or Organizations: Not on file  . Attends Banker Meetings: Not on file  . Marital Status: Not on file  Intimate Partner Violence:   . Fear of Current or Ex-Partner: Not on file  . Emotionally Abused: Not on file  . Physically Abused: Not on file  . Sexually Abused: Not on file     BP 118/64   Pulse 64   Ht 5\' 2"  (1.575 m)   Wt 192 lb (87.1 kg)   SpO2 97%   BMI 35.12 kg/m   Physical Exam:  Well appearing NAD HEENT: Unremarkable Neck:  6 cm JVD, no thyromegally Lymphatics:  No adenopathy Back:  No CVA tenderness Lungs:  Clear with no  wheezes HEART:  Regular rate rhythm, no murmurs, no rubs, no clicks Abd:  soft, positive bowel sounds, no organomegally, no rebound, no guarding Ext:  2 plus pulses, no edema, no cyanosis, no clubbing Skin:  No rashes no nodules Neuro:  CN II through XII intact, motor grossly intact  EKG - reviewed. Atrial fib with a CVR/RVR  Assess/Plan:  1. ICM - she denies anginal symptoms. She will continue her current meds. 2. Chronic systolic heart failure - Her symptoms are class 2. She will continue her current meds. 3. Obesity -she will need to work on weight loss.  4. ICD - I discussed the indications/risks/benefits/goals/expectations of ICD insertion and she would like to proceed.  Terry Gowda Rease Swinson,MD

## 2019-12-15 NOTE — Telephone Encounter (Signed)
Called patient regarding next day d/c post ICD implant on 12/14/19.  Patient reports soreness of device area. Advised patient to take tylenol for pain. Denies any increased swelling to site. Patient has pressure dressing in place.No  bleeding, drainage to dressing outer later that she can see. Patient has follow-up apt. Tomorrow 12/16/19 w/ A. T[place], PA-C @ 9:45.   Manual transmission received. Lead value results stable. No episodes.   Remote monitor discussed with patient and to assure the app is running in the background.   D/c paper work reviewed, lifting restrictions, no shower until after wound apt., arm pictures discussed.   Advised patient to call us if she has any questions or concerns. Patient verbalizes understanding.

## 2019-12-16 ENCOUNTER — Ambulatory Visit (INDEPENDENT_AMBULATORY_CARE_PROVIDER_SITE_OTHER): Payer: Medicare Other | Admitting: Student

## 2019-12-16 ENCOUNTER — Other Ambulatory Visit: Payer: Self-pay

## 2019-12-16 DIAGNOSIS — Z95810 Presence of automatic (implantable) cardiac defibrillator: Secondary | ICD-10-CM

## 2019-12-16 NOTE — Progress Notes (Signed)
  2 day wound check for pressure dressing.  No bleeding, fever, chills, or drainage. Hematoma resolved.   Pressure dressing and outer bandage removed. Steri strips in tact with no new blood or evidence of drainage.   Reviewed wound care and arm restrictions. Keep wound check next week for steri-strip removal and device check.   Casimiro Needle [address] [place], PA-C  12/16/2019 9:45 AM   (Wrong date used for picture below. Was taken and added today, 12/16/2019)

## 2019-12-16 NOTE — Patient Instructions (Signed)
Medication Instructions:  *If you need a refill on your cardiac medications before your next appointment, please call your pharmacy*  Follow-Up: At [facility], you and your health needs are our priority.  As part of our continuing mission to provide you with exceptional heart care, we have created designated Provider Care Teams.  These Care Teams include your primary Cardiologist (physician) and Advanced Practice Providers (APPs -  Physician Assistants and Nurse Practitioners) who all work together to provide you with the care you need, when you need it.  We recommend signing up for the patient portal called "MyChart".  Sign up information is provided on this After Visit Summary.  MyChart is used to connect with patients for Virtual Visits (Telemedicine).  Patients are able to view lab/test results, encounter notes, upcoming appointments, etc.  Non-urgent messages can be sent to your provider as well.   To learn more about what you can do with MyChart, go to [email or web address].    Your next appointment:   Your physician recommends that you keep your scheduled follow-up appointment with the Device Clinic

## 2019-12-22 NOTE — H&P (Signed)
ICD Criteria  Current LVEF:30%. Within 12 months prior to implant: Yes   Heart failure history: Yes, Class III  Cardiomyopathy history: Yes, Ischemic Cardiomyopathy - Prior MI.  Atrial Fibrillation/Atrial Flutter: Yes, Long standing (> 1 Year).  Ventricular tachycardia history: No.  Cardiac arrest history: No.  History of syndromes with risk of sudden death: No.  Previous ICD: No.  Current ICD indication: Primary  PPM indication: No.  Class I or II Bradycardia indication present: No  Beta Blocker therapy for 3 or more months: Yes, prescribed.   Ace Inhibitor/ARB therapy for 3 or more months: Yes, prescribed.    I have seen Terry Hanson is a 79 y.o. femalepre-procedural and has been referred by Dr. Katrinka Blazing for consideration of ICD implant for primary prevention of sudden death.  The patient's chart has been reviewed and they meet criteria for ICD implant.  I have had a thorough discussion with the patient reviewing options.  The patient and their family (if available) have had opportunities to ask questions and have them answered. The patient and I have decided together through the [facility] Heart Care Share Decision Support Tool to proceed with ICD at this time.  Risks, benefits, alternatives to ICD implantation were discussed in detail with the patient today. The patient  understands that the risks include but are not limited to bleeding, infection, pneumothorax, perforation, tamponade, vascular damage, renal failure, MI, stroke, death, inappropriate shocks, and lead dislodgement and she wishes to proceed.

## 2019-12-24 ENCOUNTER — Ambulatory Visit (INDEPENDENT_AMBULATORY_CARE_PROVIDER_SITE_OTHER): Payer: Medicare Other | Admitting: Emergency Medicine

## 2019-12-24 ENCOUNTER — Other Ambulatory Visit: Payer: Self-pay

## 2019-12-24 DIAGNOSIS — I255 Ischemic cardiomyopathy: Secondary | ICD-10-CM

## 2019-12-24 LAB — CUP PACEART INCLINIC DEVICE CHECK
Battery Remaining Longevity: 108 mo
Brady Statistic RV Percent Paced: 0.84 %
Date Time Interrogation Session: 20211007103529
HighPow Impedance: 52.875
Implantable Lead Implant Date: 20210927
Implantable Lead Location: 753860
Implantable Pulse Generator Implant Date: 20210927
Lead Channel Impedance Value: 475 Ohm
Lead Channel Pacing Threshold Amplitude: 0.75 V
Lead Channel Pacing Threshold Amplitude: 0.75 V
Lead Channel Pacing Threshold Pulse Width: 0.5 ms
Lead Channel Pacing Threshold Pulse Width: 0.5 ms
Lead Channel Sensing Intrinsic Amplitude: 11.6 mV
Lead Channel Setting Pacing Amplitude: 3.5 V
Lead Channel Setting Pacing Pulse Width: 0.5 ms
Lead Channel Setting Sensing Sensitivity: 0.5 mV
Pulse Gen Serial Number: 810006603

## 2019-12-24 NOTE — Progress Notes (Signed)
Wound check appointment. Steri-strips removed. Wound without redness or edema. Incision edges approximated, wound well healed.  Noted a small area of skin tear from steri strips present at distal end of incision measuring about 1 cm, no drainage present.  Patient to monitor closely for signs of complications.  Normal device function. Thresholds, sensing, and impedances consistent with implant measurements. Device programmed at 3.5V for extra safety margin until 3 month visit. Histogram distribution appropriate for patient and level of activity. No mode switches or ventricular arrhythmias noted. Patient educated about wound care, arm mobility, lifting restrictions, shock plan. Patient enrolled in remote monitoring, next scheduled check 03/14/20.  ROV with Dr. Ladona Ridgel on 03/16/20.

## 2020-01-12 NOTE — Progress Notes (Signed)
Cardiology Office Note    Date:  01/18/2020   ID:  Terry Hanson, DOB May 15, 1940, MRN [id number]  PCP:  [name], MD  Cardiologist: Lesleigh Noe, MD EPS: Lewayne Bunting, MD  No chief complaint on file.   History of Present Illness:  Terry Hanson is a 79 y.o. female with history of CAD status post anterior STEMI treated with stent to the LAD with residual nonobstructive disease elsewhere. Ischemic cardiomyopathy LVEF 30 to 35% 06/2019, no improvement on echo 09/23/2019, chronic systolic and diastolic CHF, atrial fib with RVR and SVT treated with amiodarone but had to be stopped because of GI side effects   Patient last saw Dr. Katrinka Blazing 09/30/2019 at which time he started Entresto 24/26 mg continue Farxiga, Aldactone, Toprol, Lasix and Lanoxin as well as Eliquis.  Need to watch kidney function, potassium on Farxiga.  Patient requested switching to be followed closely in [place].   I saw the patient 11/09/2019 at which time she was dizzy and hypotensive.  I decrease Lasix to 20 mg once daily and potassium 10 mEq daily.  Patient underwent ICD by Dr. Ladona Ridgel 12/14/2019.  Patient comes in for f/u. Still complains of dyspnea on exertion and doesn't understand why. Gets short of breath doing her housework. Also increased fatigue. Also having back pain from arthritis.  Past Medical History:  Diagnosis Date  . Arthritis   . Asthma   . Diabetes mellitus (HCC)   . Diabetes mellitus without complication (HCC)   . Enlarged heart   . Hypercholesterolemia   . Hyperlipidemia   . Ischemic cardiomyopathy    a. EF 30-35% by echo in 06/2019  . STEMI (ST elevation myocardial infarction) (HCC)    a. 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    Past Surgical History:  Procedure Laterality Date  . ABDOMINAL HYSTERECTOMY    . CORONARY/GRAFT ACUTE MI REVASCULARIZATION N/A 06/29/2019   Procedure: Coronary/Graft Acute MI Revascularization;  Surgeon: Lyn Records, MD;  Location: [facility] INVASIVE CV  LAB;  Service: Cardiovascular;  Laterality: N/A;  . ICD IMPLANT N/A 12/14/2019   Procedure: ICD IMPLANT;  Surgeon: Marinus Maw, MD;  Location: [facility] INVASIVE CV LAB;  Service: Cardiovascular;  Laterality: N/A;  . LEFT HEART CATH AND CORONARY ANGIOGRAPHY N/A 06/29/2019   Procedure: LEFT HEART CATH AND CORONARY ANGIOGRAPHY;  Surgeon: Lyn Records, MD;  Location: [facility] INVASIVE CV LAB;  Service: Cardiovascular;  Laterality: N/A;    Current Medications: Current Meds  Medication Sig  . clopidogrel (PLAVIX) 75 MG tablet Take 75 mg by mouth daily before breakfast.   . digoxin (LANOXIN) 0.125 MG tablet Take 1 tablet (0.125 mg total) by mouth 2 (two) times a week. (Patient taking differently: Take 0.125 mg by mouth every Monday, Wednesday, and Friday. )  . ELIQUIS 5 MG TABS tablet TAKE 1 TABLET BY MOUTH TWICE DAILY  . furosemide (LASIX) 40 MG tablet Take 0.5 tablets (20 mg total) by mouth daily. May take a whole pill for 2-3 pound weight gain over night, shortness of breath or edema.  Marland Kitchen levothyroxine (SYNTHROID) 25 MCG tablet Take 1 tablet (25 mcg total) by mouth daily before breakfast.  . metoprolol succinate (TOPROL-XL) 200 MG 24 hr tablet Take 1 tablet (200 mg total) by mouth daily. Take with or immediately following a meal.  . nitroGLYCERIN (NITROSTAT) 0.4 MG SL tablet Place 1 tablet (0.4 mg total) under the tongue every 5 (five) minutes as needed. (Patient taking differently: Place 0.4  mg under the tongue every 5 (five) minutes x 3 doses as needed for chest pain. )  . potassium chloride (KLOR-CON) 10 MEQ tablet Take 1 tablet (10 mEq total) by mouth daily. May take 20 mg for 2-3 pound weight gain overnight, shortness of breath, edema  . rosuvastatin (CRESTOR) 40 MG tablet Take one tablet by mouth on Monday  And Friday  . sacubitril-valsartan (ENTRESTO) 24-26 MG Take 1 tablet by mouth 2 (two) times daily.  Marland Kitchen. spironolactone (ALDACTONE) 25 MG tablet Take 0.5 tablets (12.5 mg total) by mouth every Monday,  Wednesday, and Friday.  . vitamin B-12 (CYANOCOBALAMIN) 1000 MCG tablet Take 1,000 mcg by mouth daily.  . [DISCONTINUED] dapagliflozin propanediol (FARXIGA) 5 MG TABS tablet Take 1 tablet (5 mg total) by mouth daily before breakfast.     Allergies:   Codeine, Shrimp [shellfish allergy], Penicillins, and Sulfa antibiotics   Social History   Socioeconomic History  . Marital status: Married    Spouse name: Not on file  . Number of children: Not on file  . Years of education: Not on file  . Highest education level: Not on file  Occupational History  . Not on file  Tobacco Use  . Smoking status: Never Smoker  . Smokeless tobacco: Former NeurosurgeonUser    Types: Engineer, drillingChew  Vaping Use  . Vaping Use: Never used  Substance and Sexual Activity  . Alcohol use: Not Currently  . Drug use: Never  . Sexual activity: Not on file  Other Topics Concern  . Not on file  Social History Narrative   ** Merged History Encounter **       Social Determinants of Health   Financial Resource Strain:   . Difficulty of Paying Living Expenses: Not on file  Food Insecurity:   . Worried About Programme researcher, broadcasting/film/videounning Out of Food in the Last Year: Not on file  . Ran Out of Food in the Last Year: Not on file  Transportation Needs:   . Lack of Transportation (Medical): Not on file  . Lack of Transportation (Non-Medical): Not on file  Physical Activity:   . Days of Exercise per Week: Not on file  . Minutes of Exercise per Session: Not on file  Stress:   . Feeling of Stress : Not on file  Social Connections:   . Frequency of Communication with Friends and Family: Not on file  . Frequency of Social Gatherings with Friends and Family: Not on file  . Attends Religious Services: Not on file  . Active Member of Clubs or Organizations: Not on file  . Attends BankerClub or Organization Meetings: Not on file  . Marital Status: Not on file     Family History:  The patient's family history includes Heart disease in her mother.   ROS:   Please  see the history of present illness.    ROS All other systems reviewed and are negative.   PHYSICAL EXAM:   VS:  BP 118/60   Pulse 67   Ht 5\' 2"  (1.575 m)   Wt 190 lb 8 oz (86.4 kg)   SpO2 99%   BMI 34.84 kg/m   Physical Exam  GEN: Obese, in no acute distress  Neck: no JVD, carotid bruits, or masses Cardiac:RRR; no murmurs, rubs, or gallops  Respiratory:  clear to auscultation bilaterally, normal work of breathing GI: soft, nontender, nondistended, + BS Ext: without cyanosis, clubbing, or edema, Good distal pulses bilaterally Neuro:  Alert and Oriented x 3 Psych: euthymic mood, full  affect  Wt Readings from Last 3 Encounters:  01/18/20 190 lb 8 oz (86.4 kg)  12/14/19 192 lb (87.1 kg)  12/08/19 192 lb (87.1 kg)      Studies/Labs Reviewed:   EKG:  EKG is not ordered today.    Recent Labs: 07/08/2019: B Natriuretic Peptide 1,020.0 07/10/2019: Magnesium 2.4 07/27/2019: ALT 35 08/04/2019: NT-Pro BNP 19,944 09/02/2019: TSH 4.930 12/08/2019: BUN 16; Creatinine, Ser 1.98; Hemoglobin 10.7; Platelets 215; Potassium 4.4; Sodium 138   Lipid Panel    Component Value Date/Time   CHOL 106 12/02/2019 1134   TRIG 140 12/02/2019 1134   HDL 31 (L) 12/02/2019 1134   CHOLHDL 5.2 06/29/2019 2218   VLDL 23 06/29/2019 2218   LDLCALC 50 12/02/2019 1134    Additional studies/ records that were reviewed today include:  2D echo 09/23/2019 IMPRESSIONS     1. Left ventricular ejection fraction, by estimation, is 30 to 35%. The  left ventricle has moderately decreased function. The left ventricle  demonstrates regional wall motion abnormalities (see scoring  diagram/findings for description). Left ventricular   diastolic function could not be evaluated.   2. Right ventricular systolic function is normal. The right ventricular  size is normal. There is normal pulmonary artery systolic pressure.   3. Left atrial size was mildly dilated.   4. The mitral valve is normal in structure. Mild mitral  valve  regurgitation. No evidence of mitral stenosis.   5. The aortic valve is tricuspid. Aortic valve regurgitation is trivial.  Mild to moderate aortic valve sclerosis/calcification is present, without  any evidence of aortic stenosis.   Conclusion(s)/Recommendation(s): Reduced LVEF with severe  hypokinesis/akinesis in distribution noted. Inferior/inferolateral walls  from base to mid wall are only portions near normal.    Echo 06/30/2019 IMPRESSIONS     1. Left ventricular ejection fraction, by estimation, is 30 to 35%. The  left ventricle has moderately decreased function. The left ventricle  demonstrates regional wall motion abnormalities (see scoring  diagram/findings for description). There is  moderate left ventricular hypertrophy. Left ventricular diastolic  parameters are consistent with Grade I diastolic dysfunction (impaired  relaxation). There is mild dyskinesis of the left ventricular, apex. There  is severe hypokinesis of the left  ventricular, mid-apical anterior wall, anteroseptal wall, inferior wall  and inferoseptal wall. No intracavitary thrombus is seen, but images are  suboptimal.   2. Right ventricular systolic function is normal. The right ventricular  size is normal. There is mildly elevated pulmonary artery systolic  pressure. The estimated right ventricular systolic pressure is 36.2 mmHg.   3. Left atrial size was mildly dilated.   4. The mitral valve is normal in structure. No evidence of mitral valve  regurgitation.   5. Aortic valve gradients are underestimated due to poor left ventricular  systolic function. The aortic valve is tricuspid. Aortic valve  regurgitation is not visualized. Mild aortic valve stenosis.   6. The inferior vena cava is dilated in size with <50% respiratory  variability, suggesting right atrial pressure of 15 mmHg.   Right station 4/12/2021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  99%  stenosis/thrombotic with TIMI I flow reduced to 0% with TIMI grade III flow following stent implantation using a 32 x 2.5 mm diameter onyx postdilated to 3.0 mm throughout the proximal two thirds of the stent.  The LAD is otherwise widely patent.  Less than 20% proximal left main  Small first obtuse marginal with proximal 70% narrowing.  Mid  to distal circumflex with 60% narrowing.  Right coronary arises somewhat anterior.  There is 40 to 50% proximal/ostial narrowing.  There is 70% hazy eccentric mid RCA stenosis.  LVEDP 40, consistent with acute systolic heart failure.  Prior status of the left ventricle was not known.   RECOMMENDATIONS:    Management of acute presumed systolic heart failure: BiPAP; IV Lasix; Foley catheter; IV nitroglycerin 10-100 mics to keep systolic blood pressure 110 to 120 mmHg.  High intensity statin therapy  Once volume overload has resolved, beta-blocker and angiotensin receptor/ACE/Arni therapy should be considered  2D Doppler echocardiogram to assess LV function in a.m.  Medical therapy of right coronary and circumflex disease.  Aggressive risk factor modification.           ASSESSMENT:    1. Coronary artery disease involving native coronary artery of native heart without angina pectoris   2. Ischemic cardiomyopathy   3. ICD (implantable cardioverter-defibrillator) in place   4. Stage 3 chronic kidney disease, unspecified whether stage 3a or 3b CKD (HCC)   5. Hyperlipidemia, unspecified hyperlipidemia type   6. 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 Persistent atrial fibrillation (HCC)   8. Medication management      PLAN:  In order of problems listed above:  CAD status post anterior wall MI 06/2019 treated with DES to the proximal LAD on Plavix, no ASA because of Eliquis. No angina. No bleeding problems.   Ischemic cardiomyopathy  EF did not  improve.    Underwent AICD insertion by Dr. Ladona Ridgel 12/14/2019, currently compensated. On farxiga, dig, toprol, spironolactone, entresto. With Crt 1.98 and GFR 24 will stop fraxiga.   CKD stage III Crt. 1.98 12/08/19. GFR 24. Will stop fraxiga. Repeat bmet and dig level today   Hyperlipidemia on crestor LDL 50 on 12/02/19   DM type II   Atrial fibrillation and ventricular tachycardia  developed progressive weakness nausea vomiting and diarrhea that resolved after amiodarone stopped. On Toprol XL 200 mg daily.   Tobacco abuse-stopped chewing tobacco    Hypotension with dizziness. I decreased lasix 20 mg daily and Kdur 10 meq daily. Can take whole tablet for weight gain 2-3 lbs overnight, swelling or dyspnea. no further dizziness       Medication Adjustments/Labs and Tests Ordered: Current medicines are reviewed at length with the patient today.  Concerns regarding medicines are outlined above.  Medication changes, Labs and Tests ordered today are listed in the Patient Instructions below. Patient Instructions  Medication Instructions:  Your physician has recommended you make the following change in your medication:   Stop taking [name]   *If you need a refill on your cardiac medications before your next appointment, please call your pharmacy*   Lab Work: Your physician recommends that you return for lab work in: Today   If you have labs (blood work) drawn today and your tests are completely normal, you will receive your results only by: Marland Kitchen MyChart Message (if you have MyChart) OR . A paper copy in the mail If you have any lab test that is abnormal or we need to change your treatment, we will call you to review the results.   Testing/Procedures: NONE     Follow-Up: At [facility], you and your health needs are our priority.  As part of our continuing mission to provide you with exceptional heart care, we have created designated Provider Care Teams.  These Care  Teams include  your primary Cardiologist (physician) and Advanced Practice Providers (APPs -  Physician Assistants and Nurse Practitioners) who all work together to provide you with the care you need, when you need it.  We recommend signing up for the patient portal called "MyChart".  Sign up information is provided on this After Visit Summary.  MyChart is used to connect with patients for Virtual Visits (Telemedicine).  Patients are able to view lab/test results, encounter notes, upcoming appointments, etc.  Non-urgent messages can be sent to your provider as well.   To learn more about what you can do with MyChart, go to [email or web address].    Your next appointment:   2 month(s)  The format for your next appointment:   In Person  Provider:   Dr Tenny Craw    Other Instructions Thank you for choosing [facility]!       Signed, Jacolyn Reedy, PA-C  01/18/2020 2:25 PM    [facility] [address] [place], [place], [place]  [address] Phone: [phone]; Fax: [phone]

## 2020-01-16 ENCOUNTER — Other Ambulatory Visit: Payer: Self-pay | Admitting: Interventional Cardiology

## 2020-01-18 ENCOUNTER — Other Ambulatory Visit: Payer: Self-pay

## 2020-01-18 ENCOUNTER — Ambulatory Visit (INDEPENDENT_AMBULATORY_CARE_PROVIDER_SITE_OTHER): Payer: Medicare Other | Admitting: Physician Assistant

## 2020-01-18 ENCOUNTER — Other Ambulatory Visit (HOSPITAL_COMMUNITY)
Admission: RE | Admit: 2020-01-18 | Discharge: 2020-01-18 | Disposition: A | Discharge status: Home / Self Care | Payer: Medicare Other | Source: Ambulatory Visit | Attending: Physician Assistant | Admitting: Physician Assistant

## 2020-01-18 ENCOUNTER — Encounter: Payer: Self-pay | Admitting: Physician Assistant

## 2020-01-18 VITALS — BP 118/60 | HR 67 | Ht 62.0 in | Wt 190.5 lb

## 2020-01-18 DIAGNOSIS — I4819 Other persistent atrial fibrillation: Secondary | ICD-10-CM

## 2020-01-18 DIAGNOSIS — N183 Chronic kidney disease, stage 3 unspecified: Secondary | ICD-10-CM

## 2020-01-18 DIAGNOSIS — Z95810 Presence of automatic (implantable) cardiac defibrillator: Secondary | ICD-10-CM | POA: Diagnosis not present

## 2020-01-18 DIAGNOSIS — Z79899 Other long term (current) drug therapy: Secondary | ICD-10-CM

## 2020-01-18 DIAGNOSIS — I255 Ischemic cardiomyopathy: Secondary | ICD-10-CM

## 2020-01-18 DIAGNOSIS — E785 Hyperlipidemia, unspecified: Secondary | ICD-10-CM

## 2020-01-18 DIAGNOSIS — I2510 Atherosclerotic heart disease of native coronary artery without angina pectoris: Secondary | ICD-10-CM

## 2020-01-18 DIAGNOSIS — E1152 Type 2 diabetes mellitus with diabetic peripheral angiopathy with gangrene: Secondary | ICD-10-CM

## 2020-01-18 DIAGNOSIS — Z794 Long term (current) use of insulin: Secondary | ICD-10-CM

## 2020-01-18 LAB — BASIC METABOLIC PANEL
Anion gap: 9 (ref 5–15)
BUN: 39 mg/dL — ABNORMAL HIGH (ref 8–23)
CO2: 25 mmol/L (ref 22–32)
Calcium: 9.5 mg/dL (ref 8.9–10.3)
Chloride: 100 mmol/L (ref 98–111)
Creatinine, Ser: 2.06 mg/dL — ABNORMAL HIGH (ref 0.44–1.00)
GFR, Estimated: 24 mL/min — ABNORMAL LOW (ref >60)
Glucose, Bld: 146 mg/dL — ABNORMAL HIGH (ref 70–99)
Potassium: 4.5 mmol/L (ref 3.5–5.1)
Sodium: 134 mmol/L — ABNORMAL LOW (ref 135–145)

## 2020-01-18 LAB — DIGOXIN LEVEL: Digoxin Level: 1 ng/mL (ref 1.0–2.0)

## 2020-01-18 NOTE — Patient Instructions (Addendum)
Medication Instructions:  Your physician has recommended you make the following change in your medication:   Stop taking [name]   *If you need a refill on your cardiac medications before your next appointment, please call your pharmacy*   Lab Work: Your physician recommends that you return for lab work in: Today   If you have labs (blood work) drawn today and your tests are completely normal, you will receive your results only by: Marland Kitchen MyChart Message (if you have MyChart) OR . A paper copy in the mail If you have any lab test that is abnormal or we need to change your treatment, we will call you to review the results.   Testing/Procedures: NONE     Follow-Up: At [facility], you and your health needs are our priority.  As part of our continuing mission to provide you with exceptional heart care, we have created designated Provider Care Teams.  These Care Teams include your primary Cardiologist (physician) and Advanced Practice Providers (APPs -  Physician Assistants and Nurse Practitioners) who all work together to provide you with the care you need, when you need it.  We recommend signing up for the patient portal called "MyChart".  Sign up information is provided on this After Visit Summary.  MyChart is used to connect with patients for Virtual Visits (Telemedicine).  Patients are able to view lab/test results, encounter notes, upcoming appointments, etc.  Non-urgent messages can be sent to your provider as well.   To learn more about what you can do with MyChart, go to [email or web address].    Your next appointment:   2 month(s)  The format for your next appointment:   In Person  Provider:   Dr Tenny Craw    Other Instructions Thank you for choosing [facility]!

## 2020-01-18 NOTE — Telephone Encounter (Signed)
Eliquis 5mg  refill request received. Patient is 79 years old, weight-87.1kg, Crea-1.98 on 12/08/19, Diagnosis-Afib, and last seen by Dr. 12/10/19 on 12/08/2019. Dose is appropriate based on dosing criteria. Will send in refill to requested pharmacy.

## 2020-01-19 ENCOUNTER — Telehealth: Payer: Self-pay | Admitting: *Deleted

## 2020-01-19 DIAGNOSIS — Z79899 Other long term (current) drug therapy: Secondary | ICD-10-CM

## 2020-01-19 NOTE — Telephone Encounter (Signed)
Order placed and pt notified.

## 2020-01-19 NOTE — Telephone Encounter (Signed)
-----   Message from Dyann Kief, PA-C sent at 01/19/2020  8:21 AM EDT ----- Kidney function up some more. Already stopped farxiga. Recheck bmet in 2 weeks.

## 2020-02-04 ENCOUNTER — Other Ambulatory Visit: Payer: Self-pay | Admitting: Nurse Practitioner

## 2020-02-04 NOTE — Telephone Encounter (Signed)
Pt's pharmacy is requesting a refill on levothyroxine. Would Dr. Smith like to refill this medication? Please address 

## 2020-02-04 NOTE — Telephone Encounter (Signed)
Ok to fill 

## 2020-02-29 IMAGING — DX CHEST - 2 VIEW
2 series · 2 of 2 positions shown · non-contrast
Comparison: None.

CLINICAL DATA: Acute weakness.

EXAM:
CHEST - 2 VIEW

[chest lat]
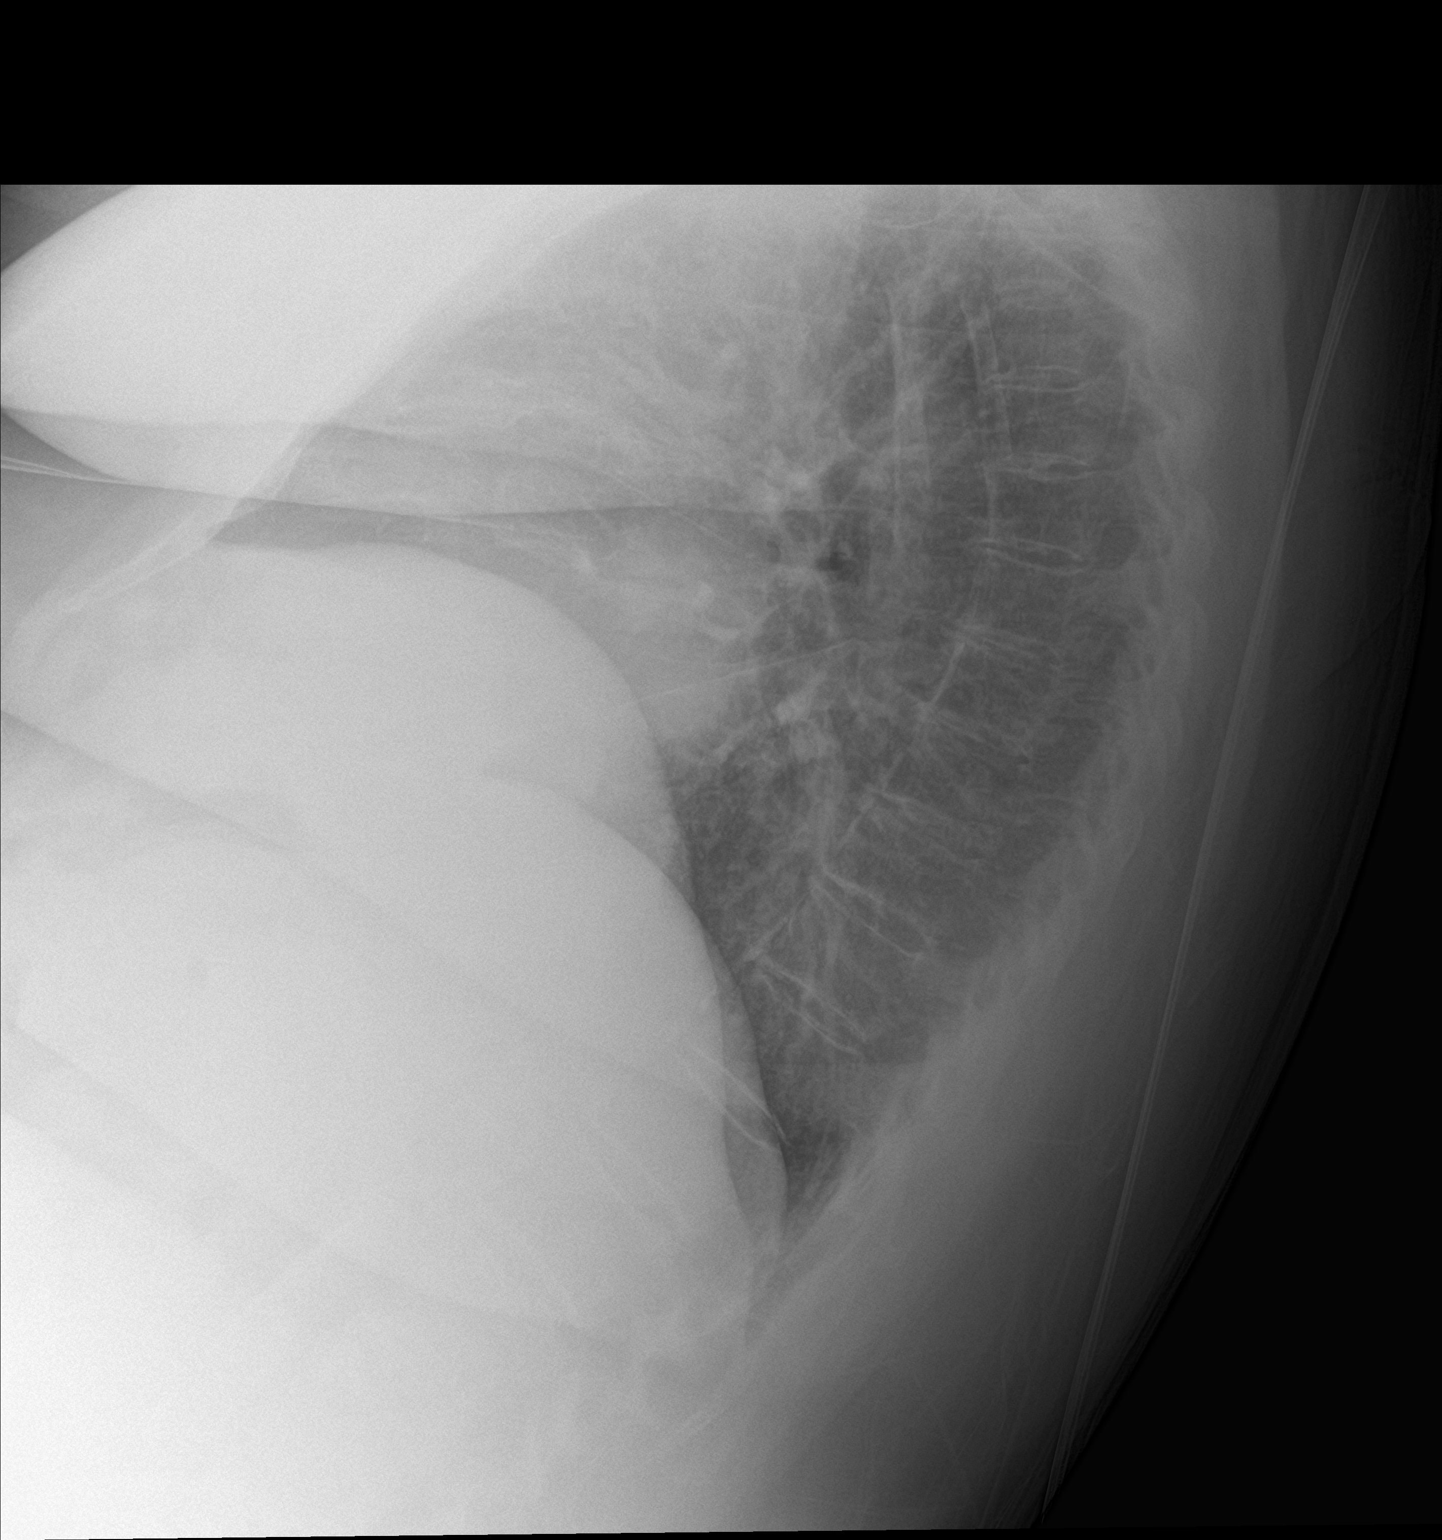

[chest ap]
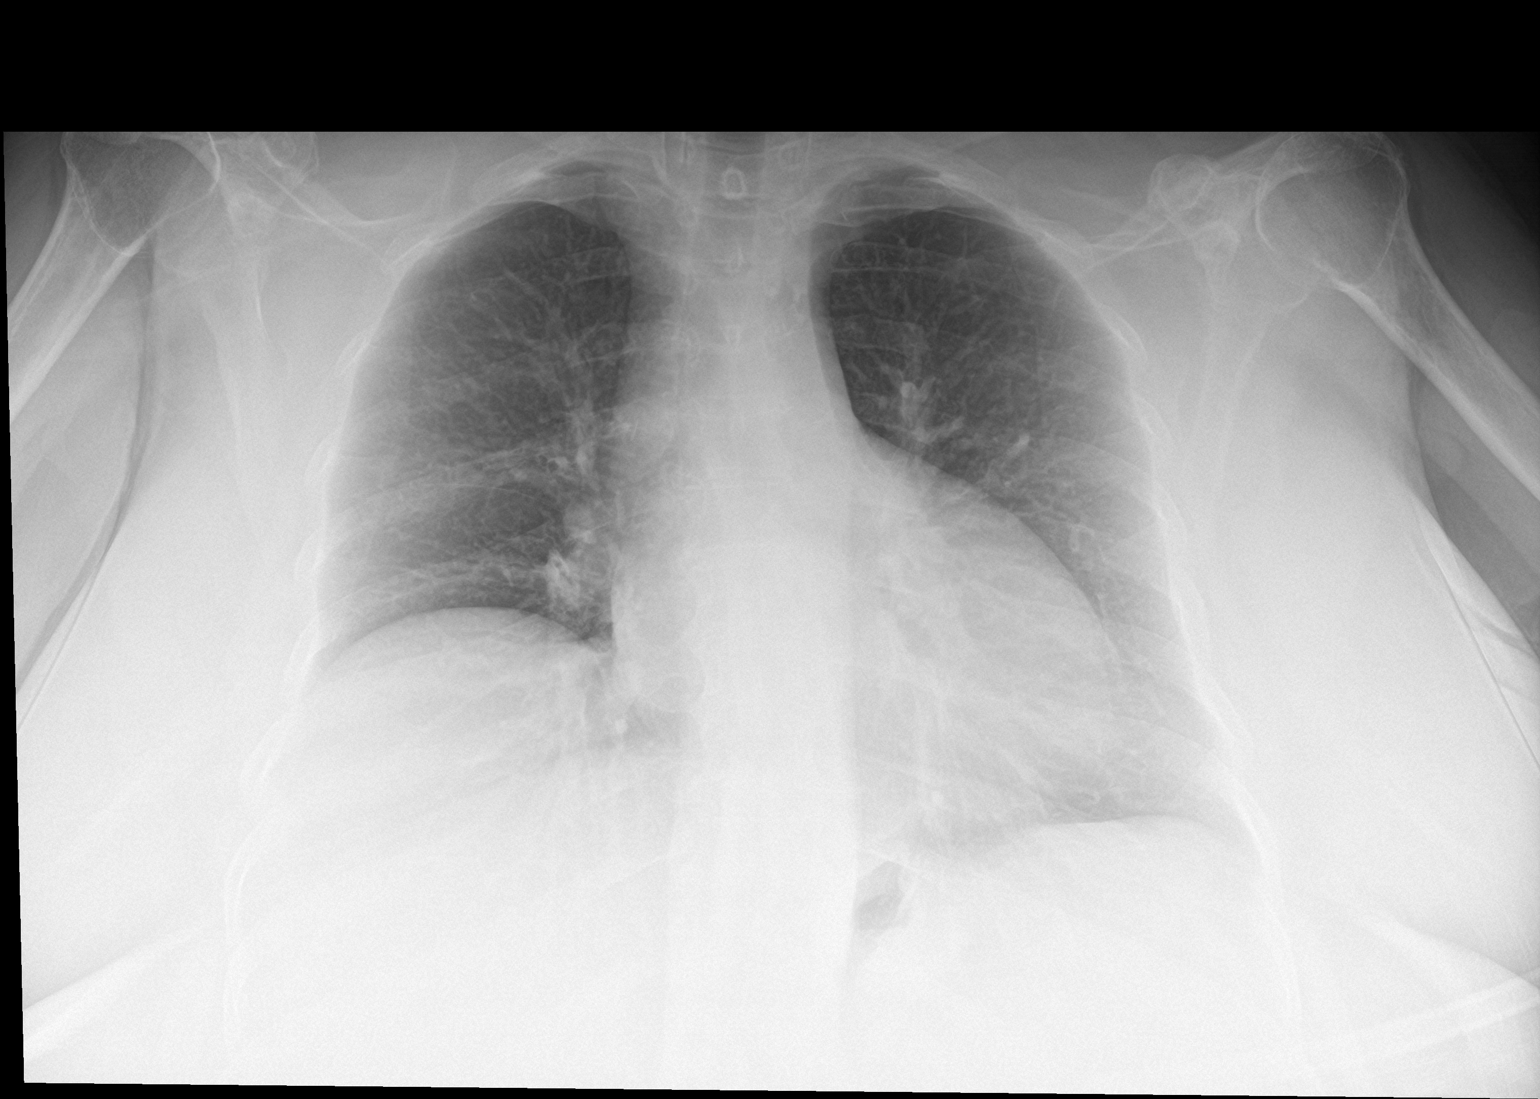

[2 of 2 positions shown; findings below may reference images not displayed]

FINDINGS: Cardiomegaly and elevated RIGHT hemidiaphragm noted.

There is no evidence of focal airspace disease, pulmonary edema,
suspicious pulmonary nodule/mass, pleural effusion, or pneumothorax.

No acute bony abnormalities are identified.
IMPRESSION: Cardiomegaly without acute abnormality.

Elevated RIGHT hemidiaphragm-likely chronic.

## 2020-03-14 ENCOUNTER — Ambulatory Visit (INDEPENDENT_AMBULATORY_CARE_PROVIDER_SITE_OTHER): Payer: Medicare Other

## 2020-03-14 DIAGNOSIS — I5023 Acute on chronic systolic (congestive) heart failure: Secondary | ICD-10-CM | POA: Diagnosis not present

## 2020-03-14 LAB — CUP PACEART REMOTE DEVICE CHECK
Battery Remaining Longevity: 98 mo
Battery Remaining Percentage: 95 %
Battery Voltage: 3.07 V
Brady Statistic RV Percent Paced: 1 %
Date Time Interrogation Session: 20211227010051
HighPow Impedance: 63 Ohm
Implantable Lead Implant Date: 20210927
Implantable Lead Location: 753860
Implantable Pulse Generator Implant Date: 20210927
Lead Channel Impedance Value: 440 Ohm
Lead Channel Pacing Threshold Amplitude: 0.75 V
Lead Channel Pacing Threshold Pulse Width: 0.5 ms
Lead Channel Sensing Intrinsic Amplitude: 11.6 mV
Lead Channel Setting Pacing Amplitude: 3.5 V
Lead Channel Setting Pacing Pulse Width: 0.5 ms
Lead Channel Setting Sensing Sensitivity: 0.5 mV
Pulse Gen Serial Number: 810006603

## 2020-03-16 ENCOUNTER — Other Ambulatory Visit: Payer: Self-pay

## 2020-03-16 ENCOUNTER — Encounter: Payer: Self-pay | Admitting: Internal Medicine

## 2020-03-16 ENCOUNTER — Other Ambulatory Visit (HOSPITAL_COMMUNITY)
Admission: RE | Admit: 2020-03-16 | Discharge: 2020-03-16 | Disposition: A | Discharge status: Home / Self Care | Payer: Medicare Other | Source: Ambulatory Visit | Attending: Internal Medicine | Admitting: Internal Medicine

## 2020-03-16 ENCOUNTER — Ambulatory Visit (INDEPENDENT_AMBULATORY_CARE_PROVIDER_SITE_OTHER): Payer: Medicare Other | Admitting: Internal Medicine

## 2020-03-16 VITALS — BP 110/68 | HR 73 | Ht 62.0 in | Wt 195.0 lb

## 2020-03-16 DIAGNOSIS — I4819 Other persistent atrial fibrillation: Secondary | ICD-10-CM

## 2020-03-16 DIAGNOSIS — Z79899 Other long term (current) drug therapy: Secondary | ICD-10-CM | POA: Insufficient documentation

## 2020-03-16 DIAGNOSIS — I255 Ischemic cardiomyopathy: Secondary | ICD-10-CM

## 2020-03-16 DIAGNOSIS — U071 COVID-19: Secondary | ICD-10-CM | POA: Diagnosis not present

## 2020-03-16 DIAGNOSIS — I2510 Atherosclerotic heart disease of native coronary artery without angina pectoris: Secondary | ICD-10-CM | POA: Diagnosis not present

## 2020-03-16 DIAGNOSIS — I509 Heart failure, unspecified: Secondary | ICD-10-CM | POA: Diagnosis not present

## 2020-03-16 LAB — CBC WITH DIFFERENTIAL/PLATELET
Abs Immature Granulocytes: 0.08 10*3/uL — ABNORMAL HIGH (ref 0.00–0.07)
Basophils Absolute: 0.1 10*3/uL (ref 0.0–0.1)
Basophils Relative: 1 %
Eosinophils Absolute: 0.2 10*3/uL (ref 0.0–0.5)
Eosinophils Relative: 3 %
HCT: 34.1 % — ABNORMAL LOW (ref 36.0–46.0)
Hemoglobin: 10.7 g/dL — ABNORMAL LOW (ref 12.0–15.0)
Immature Granulocytes: 1 %
Lymphocytes Relative: 19 %
Lymphs Abs: 1.7 10*3/uL (ref 0.7–4.0)
MCH: 31.4 pg (ref 26.0–34.0)
MCHC: 31.4 g/dL (ref 30.0–36.0)
MCV: 100 fL (ref 80.0–100.0)
Monocytes Absolute: 0.6 10*3/uL (ref 0.1–1.0)
Monocytes Relative: 6 %
Neutro Abs: 6.3 10*3/uL (ref 1.7–7.7)
Neutrophils Relative %: 70 %
Platelets: 221 10*3/uL (ref 150–400)
RBC: 3.41 MIL/uL — ABNORMAL LOW (ref 3.87–5.11)
RDW: 13.4 % (ref 11.5–15.5)
WBC: 8.9 10*3/uL (ref 4.0–10.5)
nRBC: 0 % (ref 0.0–0.2)

## 2020-03-16 LAB — BRAIN NATRIURETIC PEPTIDE: B Natriuretic Peptide: 790 pg/mL — ABNORMAL HIGH (ref 0.0–100.0)

## 2020-03-16 LAB — BASIC METABOLIC PANEL
Anion gap: 11 (ref 5–15)
BUN: 20 mg/dL (ref 8–23)
CO2: 23 mmol/L (ref 22–32)
Calcium: 9.2 mg/dL (ref 8.9–10.3)
Chloride: 103 mmol/L (ref 98–111)
Creatinine, Ser: 1.63 mg/dL — ABNORMAL HIGH (ref 0.44–1.00)
GFR, Estimated: 32 mL/min — ABNORMAL LOW (ref >60)
Glucose, Bld: 163 mg/dL — ABNORMAL HIGH (ref 70–99)
Potassium: 4.2 mmol/L (ref 3.5–5.1)
Sodium: 137 mmol/L (ref 135–145)

## 2020-03-16 LAB — CUP PACEART INCLINIC DEVICE CHECK
Battery Remaining Longevity: 106 mo
Brady Statistic RV Percent Paced: 1 % — CL
Date Time Interrogation Session: 20211229105124
HighPow Impedance: 58.5 Ohm
Implantable Lead Implant Date: 20210927
Implantable Lead Location: 753860
Implantable Pulse Generator Implant Date: 20210927
Lead Channel Impedance Value: 450 Ohm
Lead Channel Pacing Threshold Amplitude: 0.75 V
Lead Channel Pacing Threshold Pulse Width: 0.5 ms
Lead Channel Sensing Intrinsic Amplitude: 11.6 mV
Lead Channel Setting Pacing Amplitude: 2.5 V
Lead Channel Setting Pacing Pulse Width: 0.5 ms
Lead Channel Setting Sensing Sensitivity: 0.5 mV
Pulse Gen Serial Number: 810006603

## 2020-03-16 MED ORDER — METOPROLOL SUCCINATE ER 100 MG PO TB24: 100.0000 mg | ORAL_TABLET | Freq: Every day | ORAL | 3 refills | Status: DC

## 2020-03-16 NOTE — Patient Instructions (Signed)
Medication Instructions:  Your physician has recommended you make the following change in your medication:   Decrease Toprol XL to 100 mg Daily   *If you need a refill on your cardiac medications before your next appointment, please call your pharmacy*   Lab Work: Your physician recommends that you return for lab work in: Today   If you have labs (blood work) drawn today and your tests are completely normal, you will receive your results only by: Marland Kitchen MyChart Message (if you have MyChart) OR . A paper copy in the mail If you have any lab test that is abnormal or we need to change your treatment, we will call you to review the results.   Testing/Procedures: NONE   Follow-Up: At [facility], you and your health needs are our priority.  As part of our continuing mission to provide you with exceptional heart care, we have created designated Provider Care Teams.  These Care Teams include your primary Cardiologist (physician) and Advanced Practice Providers (APPs -  Physician Assistants and Nurse Practitioners) who all work together to provide you with the care you need, when you need it.  We recommend signing up for the patient portal called "MyChart".  Sign up information is provided on this After Visit Summary.  MyChart is used to connect with patients for Virtual Visits (Telemedicine).  Patients are able to view lab/test results, encounter notes, upcoming appointments, etc.  Non-urgent messages can be sent to your provider as well.   To learn more about what you can do with MyChart, go to [email or web address].    Your next appointment:   1 year(s)  The format for your next appointment:   In Person  Provider:   Lewayne Bunting, MD   Other Instructions Thank you for choosing [facility]!

## 2020-03-16 NOTE — Patient Instructions (Signed)
Medication Instructions:  Your physician recommends that you continue on your current medications as directed. Please refer to the Current Medication list given to you today.  *If you need a refill on your cardiac medications before your next appointment, please call your pharmacy*   Lab Work: TODAY: BMET, BNP, CBC If you have labs (blood work) drawn today and your tests are completely normal, you will receive your results only by: Marland Kitchen MyChart Message (if you have MyChart) OR . A paper copy in the mail If you have any lab test that is abnormal or we need to change your treatment, we will call you to review the results.   Testing/Procedures: NONE   Follow-Up: At [facility], you and your health needs are our priority.  As part of our continuing mission to provide you with exceptional heart care, we have created designated Provider Care Teams.  These Care Teams include your primary Cardiologist (physician) and Advanced Practice Providers (APPs -  Physician Assistants and Nurse Practitioners) who all work together to provide you with the care you need, when you need it.  We recommend signing up for the patient portal called "MyChart".  Sign up information is provided on this After Visit Summary.  MyChart is used to connect with patients for Virtual Visits (Telemedicine).  Patients are able to view lab/test results, encounter notes, upcoming appointments, etc.  Non-urgent messages can be sent to your provider as well.   To learn more about what you can do with MyChart, go to [email or web address].    Your next appointment:   DEPENDS ON YOUR LAB RESULTS

## 2020-03-16 NOTE — Progress Notes (Signed)
HPI Mrs. Victory returns today for evaluation of chronic systolic heart failure and an ICM s/p ICD insertion for primary prevention. She is a pleasant morbidly obese 79 yo woman with an anterior MI, s/p late presentation, s/p PCI.  She has not had syncope. She feels weak and tired today. No energy. No chest pain or sob. No edema.  Allergies  Allergen Reactions  . Codeine Hives  . Shrimp [Shellfish Allergy] Other (See Comments)    Broke out  . Penicillins Itching and Rash    Did it involve swelling of the face/tongue/throat, SOB, or low BP? Yes Did it involve sudden or severe rash/hives, skin peeling, or any reaction on the inside of your mouth or nose? Yes Did you need to seek medical attention at a hospital or doctor's office? Yes When did it last happen? Over 10 years If all above answers are "NO", may proceed with cephalosporin use.   . Sulfa Antibiotics Itching     Current Outpatient Medications  Medication Sig Dispense Refill  . clopidogrel (PLAVIX) 75 MG tablet Take 75 mg by mouth daily before breakfast.     . digoxin (LANOXIN) 0.125 MG tablet Take 1 tablet (0.125 mg total) by mouth 2 (two) times a week. (Patient taking differently: Take 0.125 mg by mouth every Monday, Wednesday, and Friday.) 30 tablet 3  . ELIQUIS 5 MG TABS tablet TAKE 1 TABLET BY MOUTH TWICE DAILY 60 tablet 10  . furosemide (LASIX) 40 MG tablet Take 0.5 tablets (20 mg total) by mouth daily. May take a whole pill for 2-3 pound weight gain over night, shortness of breath or edema. 45 tablet 3  . levothyroxine (SYNTHROID) 25 MCG tablet Take 1 tablet (25 mcg total) by mouth daily before breakfast. 90 tablet 3  . metoprolol succinate (TOPROL-XL) 200 MG 24 hr tablet Take 1 tablet (200 mg total) by mouth daily. Take with or immediately following a meal. 30 tablet 11  . nitroGLYCERIN (NITROSTAT) 0.4 MG SL tablet Place 1 tablet (0.4 mg total) under the tongue every 5 (five) minutes as needed. (Patient taking  differently: Place 0.4 mg under the tongue every 5 (five) minutes x 3 doses as needed for chest pain.) 25 tablet 2  . rosuvastatin (CRESTOR) 40 MG tablet Take one tablet by mouth on Monday  And Friday 25 tablet 1  . sacubitril-valsartan (ENTRESTO) 24-26 MG Take 1 tablet by mouth 2 (two) times daily. 60 tablet 11  . spironolactone (ALDACTONE) 25 MG tablet Take 0.5 tablets (12.5 mg total) by mouth every Monday, Wednesday, and Friday. 20 tablet 3  . vitamin B-12 (CYANOCOBALAMIN) 1000 MCG tablet Take 1,000 mcg by mouth daily.    . potassium chloride (KLOR-CON) 10 MEQ tablet Take 1 tablet (10 mEq total) by mouth daily. May take 20 mg for 2-3 pound weight gain overnight, shortness of breath, edema 120 tablet 3   No current facility-administered medications for this visit.     Past Medical History:  Diagnosis Date  . Arthritis   . Asthma   . Diabetes mellitus (HCC)   . Diabetes mellitus without complication (HCC)   . Enlarged heart   . Hypercholesterolemia   . Hyperlipidemia   . Ischemic cardiomyopathy    a. EF 30-35% by echo in 06/2019  . STEMI (ST elevation myocardial infarction) (HCC)    a. 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    ROS:   All systems reviewed and negative except as noted in the HPI.  Past Surgical History:  Procedure Laterality Date  . ABDOMINAL HYSTERECTOMY    . CORONARY/GRAFT ACUTE MI REVASCULARIZATION N/A 06/29/2019   Procedure: Coronary/Graft Acute MI Revascularization;  Surgeon: Lyn Records, MD;  Location: [facility] INVASIVE CV LAB;  Service: Cardiovascular;  Laterality: N/A;  . ICD IMPLANT N/A 12/14/2019   Procedure: ICD IMPLANT;  Surgeon: Marinus Maw, MD;  Location: [facility] INVASIVE CV LAB;  Service: Cardiovascular;  Laterality: N/A;  . LEFT HEART CATH AND CORONARY ANGIOGRAPHY N/A 06/29/2019   Procedure: LEFT HEART CATH AND CORONARY ANGIOGRAPHY;  Surgeon: Lyn Records, MD;  Location: [facility] INVASIVE CV LAB;  Service: Cardiovascular;  Laterality: N/A;      Family History  Problem Relation Age of Onset  . Heart disease Mother      Social History   Socioeconomic History  . Marital status: Married    Spouse name: Not on file  . Number of children: Not on file  . Years of education: Not on file  . Highest education level: Not on file  Occupational History  . Not on file  Tobacco Use  . Smoking status: Never Smoker  . Smokeless tobacco: Former Neurosurgeon    Types: Engineer, drilling  . Vaping Use: Never used  Substance and Sexual Activity  . Alcohol use: Not Currently  . Drug use: Never  . Sexual activity: Not on file  Other Topics Concern  . Not on file  Social History Narrative   ** Merged History Encounter **       Social Determinants of Health   Financial Resource Strain: Not on file  Food Insecurity: Not on file  Transportation Needs: Not on file  Physical Activity: Not on file  Stress: Not on file  Social Connections: Not on file  Intimate Partner Violence: Not on file     BP 110/68   Pulse 73   Ht 5\' 2"  (1.575 m)   Wt 195 lb (88.5 kg)   SpO2 95%   BMI 35.67 kg/m   Physical Exam:  Well appearing NAD HEENT: Unremarkable Neck:  No JVD, no thyromegally Lymphatics:  No adenopathy Back:  No CVA tenderness Lungs:  Clear with no wheezes HEART:  IRegular rate rhythm, no murmurs, no rubs, no clicks Abd:  soft, positive bowel sounds, no organomegally, no rebound, no guarding Ext:  2 plus pulses, no edema, no cyanosis, no clubbing Skin:  No rashes no nodules Neuro:  CN II through XII intact, motor grossly intact   DEVICE  Normal device function.  See PaceArt for details.   Assess/Plan 1. Fatigue and weakness - she will continue her medical therapy but reduce the toprol to 100 mg daily. 2. Chronic anti-coag - she denies bleeding but I am concerned and will ask her to check a CBC 3. Obesity - she is encouraged to lose weight. 4. Chronic systolic heart failure - her symptoms are class 3. We will follow. 5.  ICD - her St. Jude single chamber ICD is working normally.  Jesse Hirst,MD

## 2020-03-16 NOTE — Progress Notes (Signed)
Cardiology Office Note   Date:  03/16/2020   ID:  Terry Hanson, DOB [birth year], MRN [id number]  PCP:  [name], MD  Cardiologist:   Dietrich Pates, MD   Pt presents for f/u of CAD and CHF     History of Present Illness: Terry Hanson is a 79 y.o. female with history of CAD status post anterior STEMI treated with stent to the LAD with residual nonobstructive disease elsewhere. ALso a hx of Ischemic cardiomyopathy with  LVEF 30 to 35% 06/2019, no improvement on echo 09/23/2019, , atrial fib with RVR and SVT treated with amiodarone Unfort amio had to be stopped because of GI side effects   Patient last saw Dr. Katrinka Blazing 09/30/2019 at which timeEntresto 24/26 mg started   Pt continued Farxiga, Aldactone, Toprol, Lasix and Lanoxin as well as Eliquis. Patient requested switching to be followed closely in [place].  She was seen by Leda Gauze this past summer and last visit was in November   Since seen in November  the pt complains of fatigue   Also notes  some SOB   She denies CP  She was seen earlier today by Rosette Reveal   He cut back on b blocker Watching salt    Current Meds  Medication Sig  . clopidogrel (PLAVIX) 75 MG tablet Take 75 mg by mouth daily before breakfast.   . digoxin (LANOXIN) 0.125 MG tablet Take 1 tablet (0.125 mg total) by mouth 2 (two) times a week. (Patient taking differently: Take 0.125 mg by mouth every Monday, Wednesday, and Friday.)  . ELIQUIS 5 MG TABS tablet TAKE 1 TABLET BY MOUTH TWICE DAILY  . furosemide (LASIX) 40 MG tablet Take 0.5 tablets (20 mg total) by mouth daily. May take a whole pill for 2-3 pound weight gain over night, shortness of breath or edema.  Marland Kitchen levothyroxine (SYNTHROID) 25 MCG tablet Take 1 tablet (25 mcg total) by mouth daily before breakfast.  . nitroGLYCERIN (NITROSTAT) 0.4 MG SL tablet Place 1 tablet (0.4 mg total) under the tongue every 5 (five) minutes as needed. (Patient taking differently: Place 0.4 mg under the tongue every 5 (five) minutes x  3 doses as needed for chest pain.)  . rosuvastatin (CRESTOR) 40 MG tablet Take one tablet by mouth on Monday  And Friday  . sacubitril-valsartan (ENTRESTO) 24-26 MG Take 1 tablet by mouth 2 (two) times daily.  Marland Kitchen spironolactone (ALDACTONE) 25 MG tablet Take 0.5 tablets (12.5 mg total) by mouth every Monday, Wednesday, and Friday.  . vitamin B-12 (CYANOCOBALAMIN) 1000 MCG tablet Take 1,000 mcg by mouth daily.  . [DISCONTINUED] metoprolol succinate (TOPROL-XL) 200 MG 24 hr tablet Take 1 tablet (200 mg total) by mouth daily. Take with or immediately following a meal.     Allergies:   Codeine, Shrimp [shellfish allergy], Penicillins, and Sulfa antibiotics   Past Medical History:  Diagnosis Date  . Arthritis   . Asthma   . Diabetes mellitus (HCC)   . Diabetes mellitus without complication (HCC)   . Enlarged heart   . Hypercholesterolemia   . Hyperlipidemia   . Ischemic cardiomyopathy    a. EF 30-35% by echo in 06/2019  . STEMI (ST elevation myocardial infarction) (HCC)    a. 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    Past Surgical History:  Procedure Laterality Date  . ABDOMINAL HYSTERECTOMY    . CORONARY/GRAFT ACUTE MI REVASCULARIZATION N/A 06/29/2019   Procedure: Coronary/Graft Acute MI Revascularization;  Surgeon: Verdis Prime  W, MD;  Location: [facility] INVASIVE CV LAB;  Service: Cardiovascular;  Laterality: N/A;  . ICD IMPLANT N/A 12/14/2019   Procedure: ICD IMPLANT;  Surgeon: Marinus Maw, MD;  [facility] INVASIVE CV LAB;  Service: Cardiovascular;  Laterality: N/A;  . LEFT HEART CATH AND CORONARY ANGIOGRAPHY N/A 06/29/2019   Procedure: LEFT HEART CATH AND CORONARY ANGIOGRAPHY;  Surgeon: Lyn Records, MD;  Location: [facility] INVASIVE CV LAB;  Service: Cardiovascular;  Laterality: N/A;     Social History:  The patient  reports that she has never smoked. She has quit using smokeless tobacco.  Her smokeless tobacco use included chew. She reports previous alcohol use. She reports that  she does not use drugs.   Family History:  The patient's family history includes Heart disease in her mother.    ROS:  Please see the history of present illness. All other systems are reviewed and  Negative to the above problem except as noted.    PHYSICAL EXAM: VS:  BP 110/68   Pulse 73   Ht 5\' 2"  (1.575 m)   Wt 195 lb (88.5 kg)   SpO2 95%   BMI 35.67 kg/m   GEN: Morbidly obese 79 yo  in no acute distress   Examined in chair HEENT: normal  Neck: no JVD, carotid bruits Cardiac: RRR; no murmurs,  No signifi LE edema  Respiratory:  clear to auscultation bilaterally GI: soft, nontender  Obese  MS: no deformity Moving all extremities   Skin: warm and dry, no rash Neuro:  Strength and sensation are intact Psych: euthymic mood, full affect   EKG:  EKG is not ordered today.   Lipid Panel    Component Value Date/Time   CHOL 106 12/02/2019 1134   TRIG 140 12/02/2019 1134   HDL 31 (L) 12/02/2019 1134   CHOLHDL 5.2 06/29/2019 2218   VLDL 23 06/29/2019 2218   LDLCALC 50 12/02/2019 1134      Wt Readings from Last 3 Encounters:  03/16/20 195 lb (88.5 kg)  03/16/20 195 lb (88.5 kg)  01/18/20 190 lb 8 oz (86.4 kg)      ASSESSMENT AND PLAN 1  Ischemic CM   Volume status today is not too bad though pt's size makes evaluation difficult   Pt has CBC ordered   I would check BMET and BNP   Keep on same meds for now  2   CAD   On Plavix and Eliquis  Check CBC   No symptoms of angina  3  Atrial fib.   As noted above back off on Toprol     3  CKD   Stage III   Will repeat BMET  4   HL  Keep on Crestor   Last LDL 50       Current medicines are reviewed at length with the patient today.  The patient does not have concerns regarding medicines.  Signed, 13/01/21, MD  03/16/2020 11:24 AM    [facility] [address] [place], [place], [place]  [place] Phone: [phone]; Fax: [phone]

## 2020-03-17 ENCOUNTER — Telehealth: Payer: Self-pay

## 2020-03-17 DIAGNOSIS — Z79899 Other long term (current) drug therapy: Secondary | ICD-10-CM

## 2020-03-17 NOTE — Telephone Encounter (Signed)
I was able to reach patient. I gave her lab results and she will repeat her at [facility] lab on 04/07/20  She will also take lasix 40 mg 2 times a week

## 2020-03-17 NOTE — Telephone Encounter (Signed)
Attempt to reach answering machine gives patients name and then hangs up.

## 2020-03-17 NOTE — Telephone Encounter (Signed)
-----   Message from Dietrich Pates V, MD sent at 03/16/2020 11:34 PM EST ----- Fluid is up a little    She is taking 1/2 lasx   1 tab as needed    I would recomm taking 1 lasix 2x per week  (about every 3rd day) Check BMET and BNP in 3 wks

## 2020-03-17 NOTE — Addendum Note (Signed)
Addended by: Marlyn Corporal A on: 03/17/2020 09:23 AM   Modules accepted: Orders

## 2020-03-19 ENCOUNTER — Emergency Department (HOSPITAL_COMMUNITY): Payer: Medicare Other

## 2020-03-19 ENCOUNTER — Inpatient Hospital Stay (HOSPITAL_COMMUNITY)
Admission: EM | Admit: 2020-03-19 | Discharge: 2020-03-21 | DRG: 177 | Disposition: A | Discharge status: Home / Self Care | Payer: Medicare Other | Attending: Internal Medicine | Admitting: Internal Medicine

## 2020-03-19 ENCOUNTER — Other Ambulatory Visit: Payer: Self-pay

## 2020-03-19 ENCOUNTER — Encounter (HOSPITAL_COMMUNITY): Payer: Self-pay | Admitting: *Deleted

## 2020-03-19 DIAGNOSIS — Z95810 Presence of automatic (implantable) cardiac defibrillator: Secondary | ICD-10-CM

## 2020-03-19 DIAGNOSIS — Z880 Allergy status to penicillin: Secondary | ICD-10-CM | POA: Diagnosis not present

## 2020-03-19 DIAGNOSIS — J1282 Pneumonia due to coronavirus disease 2019: Secondary | ICD-10-CM | POA: Diagnosis present

## 2020-03-19 DIAGNOSIS — U071 COVID-19: Secondary | ICD-10-CM | POA: Diagnosis present

## 2020-03-19 DIAGNOSIS — Z79899 Other long term (current) drug therapy: Secondary | ICD-10-CM

## 2020-03-19 DIAGNOSIS — I130 Hypertensive heart and chronic kidney disease with heart failure and stage 1 through stage 4 chronic kidney disease, or unspecified chronic kidney disease: Secondary | ICD-10-CM | POA: Diagnosis present

## 2020-03-19 DIAGNOSIS — E1122 Type 2 diabetes mellitus with diabetic chronic kidney disease: Secondary | ICD-10-CM | POA: Diagnosis present

## 2020-03-19 DIAGNOSIS — Z79890 Hormone replacement therapy (postmenopausal): Secondary | ICD-10-CM

## 2020-03-19 DIAGNOSIS — N1831 Chronic kidney disease, stage 3a: Secondary | ICD-10-CM | POA: Diagnosis present

## 2020-03-19 DIAGNOSIS — Z885 Allergy status to narcotic agent status: Secondary | ICD-10-CM | POA: Diagnosis not present

## 2020-03-19 DIAGNOSIS — Z955 Presence of coronary angioplasty implant and graft: Secondary | ICD-10-CM

## 2020-03-19 DIAGNOSIS — Z6834 Body mass index (BMI) 34.0-34.9, adult: Secondary | ICD-10-CM

## 2020-03-19 DIAGNOSIS — Z9114 Patient's other noncompliance with medication regimen: Secondary | ICD-10-CM

## 2020-03-19 DIAGNOSIS — I252 Old myocardial infarction: Secondary | ICD-10-CM

## 2020-03-19 DIAGNOSIS — D631 Anemia in chronic kidney disease: Secondary | ICD-10-CM | POA: Diagnosis present

## 2020-03-19 DIAGNOSIS — E785 Hyperlipidemia, unspecified: Secondary | ICD-10-CM | POA: Diagnosis present

## 2020-03-19 DIAGNOSIS — Z8249 Family history of ischemic heart disease and other diseases of the circulatory system: Secondary | ICD-10-CM

## 2020-03-19 DIAGNOSIS — Z7902 Long term (current) use of antithrombotics/antiplatelets: Secondary | ICD-10-CM

## 2020-03-19 DIAGNOSIS — Z90710 Acquired absence of both cervix and uterus: Secondary | ICD-10-CM | POA: Diagnosis not present

## 2020-03-19 DIAGNOSIS — I509 Heart failure, unspecified: Secondary | ICD-10-CM

## 2020-03-19 DIAGNOSIS — N1832 Chronic kidney disease, stage 3b: Secondary | ICD-10-CM

## 2020-03-19 DIAGNOSIS — J9602 Acute respiratory failure with hypercapnia: Secondary | ICD-10-CM | POA: Diagnosis not present

## 2020-03-19 DIAGNOSIS — N183 Chronic kidney disease, stage 3 unspecified: Secondary | ICD-10-CM | POA: Diagnosis present

## 2020-03-19 DIAGNOSIS — I5023 Acute on chronic systolic (congestive) heart failure: Secondary | ICD-10-CM | POA: Diagnosis present

## 2020-03-19 DIAGNOSIS — Z87891 Personal history of nicotine dependence: Secondary | ICD-10-CM

## 2020-03-19 DIAGNOSIS — E669 Obesity, unspecified: Secondary | ICD-10-CM | POA: Diagnosis present

## 2020-03-19 DIAGNOSIS — Z91013 Allergy to seafood: Secondary | ICD-10-CM

## 2020-03-19 DIAGNOSIS — I5022 Chronic systolic (congestive) heart failure: Secondary | ICD-10-CM

## 2020-03-19 DIAGNOSIS — E039 Hypothyroidism, unspecified: Secondary | ICD-10-CM | POA: Diagnosis present

## 2020-03-19 DIAGNOSIS — Z7901 Long term (current) use of anticoagulants: Secondary | ICD-10-CM

## 2020-03-19 DIAGNOSIS — I2510 Atherosclerotic heart disease of native coronary artery without angina pectoris: Secondary | ICD-10-CM | POA: Diagnosis present

## 2020-03-19 DIAGNOSIS — Z882 Allergy status to sulfonamides status: Secondary | ICD-10-CM

## 2020-03-19 DIAGNOSIS — I482 Chronic atrial fibrillation, unspecified: Secondary | ICD-10-CM | POA: Diagnosis present

## 2020-03-19 DIAGNOSIS — I5021 Acute systolic (congestive) heart failure: Secondary | ICD-10-CM | POA: Diagnosis not present

## 2020-03-19 DIAGNOSIS — I255 Ischemic cardiomyopathy: Secondary | ICD-10-CM | POA: Diagnosis present

## 2020-03-19 DIAGNOSIS — N184 Chronic kidney disease, stage 4 (severe): Secondary | ICD-10-CM | POA: Diagnosis present

## 2020-03-19 DIAGNOSIS — I5042 Chronic combined systolic (congestive) and diastolic (congestive) heart failure: Secondary | ICD-10-CM

## 2020-03-19 HISTORY — DX: Heart failure, unspecified: I50.9

## 2020-03-19 LAB — COMPREHENSIVE METABOLIC PANEL
ALT: 12 U/L (ref 0–44)
AST: 16 U/L (ref 15–41)
Albumin: 3.5 g/dL (ref 3.5–5.0)
Alkaline Phosphatase: 18 U/L — ABNORMAL LOW (ref 38–126)
Anion gap: 10 (ref 5–15)
BUN: 22 mg/dL (ref 8–23)
CO2: 21 mmol/L — ABNORMAL LOW (ref 22–32)
Calcium: 8.8 mg/dL — ABNORMAL LOW (ref 8.9–10.3)
Chloride: 104 mmol/L (ref 98–111)
Creatinine, Ser: 1.49 mg/dL — ABNORMAL HIGH (ref 0.44–1.00)
GFR, Estimated: 36 mL/min — ABNORMAL LOW (ref >60)
Glucose, Bld: 136 mg/dL — ABNORMAL HIGH (ref 70–99)
Potassium: 4 mmol/L (ref 3.5–5.1)
Sodium: 135 mmol/L (ref 135–145)
Total Bilirubin: 1.4 mg/dL — ABNORMAL HIGH (ref 0.3–1.2)
Total Protein: 6.9 g/dL (ref 6.5–8.1)

## 2020-03-19 LAB — CBC WITH DIFFERENTIAL/PLATELET
Abs Immature Granulocytes: 0.05 10*3/uL (ref 0.00–0.07)
Basophils Absolute: 0 10*3/uL (ref 0.0–0.1)
Basophils Relative: 0 %
Eosinophils Absolute: 0 10*3/uL (ref 0.0–0.5)
Eosinophils Relative: 0 %
HCT: 32 % — ABNORMAL LOW (ref 36.0–46.0)
Hemoglobin: 10.1 g/dL — ABNORMAL LOW (ref 12.0–15.0)
Immature Granulocytes: 1 %
Lymphocytes Relative: 21 %
Lymphs Abs: 1.5 10*3/uL (ref 0.7–4.0)
MCH: 31.5 pg (ref 26.0–34.0)
MCHC: 31.6 g/dL (ref 30.0–36.0)
MCV: 99.7 fL (ref 80.0–100.0)
Monocytes Absolute: 0.9 10*3/uL (ref 0.1–1.0)
Monocytes Relative: 12 %
Neutro Abs: 4.8 10*3/uL (ref 1.7–7.7)
Neutrophils Relative %: 66 %
Platelets: 166 10*3/uL (ref 150–400)
RBC: 3.21 MIL/uL — ABNORMAL LOW (ref 3.87–5.11)
RDW: 13.9 % (ref 11.5–15.5)
WBC: 7.3 10*3/uL (ref 4.0–10.5)
nRBC: 0 % (ref 0.0–0.2)

## 2020-03-19 LAB — PROCALCITONIN: Procalcitonin: <0.1 ng/mL

## 2020-03-19 LAB — BASIC METABOLIC PANEL
Anion gap: 9 (ref 5–15)
BUN: 22 mg/dL (ref 8–23)
CO2: 23 mmol/L (ref 22–32)
Calcium: 8.9 mg/dL (ref 8.9–10.3)
Chloride: 103 mmol/L (ref 98–111)
Creatinine, Ser: 1.67 mg/dL — ABNORMAL HIGH (ref 0.44–1.00)
GFR, Estimated: 31 mL/min — ABNORMAL LOW (ref >60)
Glucose, Bld: 136 mg/dL — ABNORMAL HIGH (ref 70–99)
Potassium: 4.4 mmol/L (ref 3.5–5.1)
Sodium: 135 mmol/L (ref 135–145)

## 2020-03-19 LAB — RESP PANEL BY RT-PCR (FLU A&B, COVID) ARPGX2
Influenza A by PCR: NEGATIVE
Influenza B by PCR: NEGATIVE
SARS Coronavirus 2 by RT PCR: POSITIVE — AB

## 2020-03-19 LAB — LACTATE DEHYDROGENASE: LDH: 184 U/L (ref 98–192)

## 2020-03-19 LAB — LACTIC ACID, PLASMA: Lactic Acid, Venous: 1.1 mmol/L (ref 0.5–1.9)

## 2020-03-19 LAB — TROPONIN I (HIGH SENSITIVITY): Troponin I (High Sensitivity): 31 ng/L — ABNORMAL HIGH (ref <18)

## 2020-03-19 LAB — BRAIN NATRIURETIC PEPTIDE: B Natriuretic Peptide: 1686 pg/mL — ABNORMAL HIGH (ref 0.0–100.0)

## 2020-03-19 LAB — TRIGLYCERIDES: Triglycerides: 136 mg/dL (ref <150)

## 2020-03-19 LAB — D-DIMER, QUANTITATIVE: D-Dimer, Quant: 1.01 ug/mL-FEU — ABNORMAL HIGH (ref 0.00–0.50)

## 2020-03-19 LAB — FERRITIN: Ferritin: 219 ng/mL (ref 11–307)

## 2020-03-19 LAB — FIBRINOGEN: Fibrinogen: 602 mg/dL — ABNORMAL HIGH (ref 210–475)

## 2020-03-19 LAB — C-REACTIVE PROTEIN: CRP: 10.1 mg/dL — ABNORMAL HIGH (ref <1.0)

## 2020-03-19 MED ORDER — DEXAMETHASONE SODIUM PHOSPHATE 10 MG/ML IJ SOLN
6.0000 mg | Freq: Once | INTRAMUSCULAR | Status: AC
  Administered 2020-03-19: 6 mg via INTRAVENOUS
  Filled 2020-03-19: qty 1

## 2020-03-19 MED ORDER — FUROSEMIDE 10 MG/ML IJ SOLN
40.0000 mg | Freq: Once | INTRAMUSCULAR | Status: AC
  Administered 2020-03-19: 40 mg via INTRAVENOUS
  Filled 2020-03-19: qty 4

## 2020-03-19 MED ORDER — ACETAMINOPHEN 325 MG PO TABS: 650.0000 mg | ORAL_TABLET | Freq: Four times a day (QID) | ORAL | Status: DC | PRN

## 2020-03-19 MED ORDER — ENOXAPARIN SODIUM 40 MG/0.4ML ~~LOC~~ SOLN: 40.0000 mg | SUBCUTANEOUS | Status: DC

## 2020-03-19 NOTE — ED Notes (Signed)
MD at bedside at this time.

## 2020-03-19 NOTE — H&P (Incomplete)
History and Physical  Terry Hanson:[id number] DOB: 09/16/1940 DOA: 03/19/2020  Referring physician: ***  PCP: Lucia Gaskins, MD  Outpatient Specialists: *** Patient coming from: *** & is able to ambulate ***  Chief Complaint: ***   HPI: Terry Hanson is a 80 y.o. female with medical history significant for *** (For level 3, the HPI must include 4+ descriptors: Location, Quality, Severity, Duration, Timing, Context, modifying factors, associated signs/symptoms and/or status of 3+ chronic problems.)   ED Course: ***  Review of Systems: Review of systems as noted in the HPI. All other systems reviewed and are negative.   Past Medical History:  Diagnosis Date  . Arthritis   . Asthma   . CHF (congestive heart failure) ([place])   . Diabetes mellitus ([place])   . Diabetes mellitus without complication ([place])   . Enlarged heart   . Hypercholesterolemia   . Hyperlipidemia   . Ischemic cardiomyopathy    a. EF 30-35% by echo in 06/2019  . STEMI (ST elevation myocardial infarction) ([place])    a. 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   Past Surgical History:  Procedure Laterality Date  . ABDOMINAL HYSTERECTOMY    . CORONARY/GRAFT ACUTE MI REVASCULARIZATION N/A 06/29/2019   Procedure: Coronary/Graft Acute MI Revascularization;  Surgeon: Belva Crome, MD;  Location: [place] CV LAB;  Service: Cardiovascular;  Laterality: N/A;  . ICD IMPLANT N/A 12/14/2019   Procedure: ICD IMPLANT;  Surgeon: Evans Lance, MD;  Location: [place] CV LAB;  Service: Cardiovascular;  Laterality: N/A;  . LEFT HEART CATH AND CORONARY ANGIOGRAPHY N/A 06/29/2019   Procedure: LEFT HEART CATH AND CORONARY ANGIOGRAPHY;  Surgeon: Belva Crome, MD;  Location: [place] CV LAB;  Service: Cardiovascular;  Laterality: N/A;    Social History:  reports that she has never smoked. She has quit using smokeless tobacco.  Her smokeless tobacco use included chew. She reports previous alcohol use. She reports that  she does not use drugs.   Allergies  Allergen Reactions  . Codeine Hives  . Shrimp [Shellfish Allergy] Other (See Comments)    Broke out  . Penicillins Itching and Rash    Did it involve swelling of the face/tongue/throat, SOB, or low BP? Yes Did it involve sudden or severe rash/hives, skin peeling, or any reaction on the inside of your mouth or nose? Yes Did you need to seek medical attention at a hospital or doctor's office? Yes When did it last happen? Over 10 years If all above answers are "NO", may proceed with cephalosporin use.   . Sulfa Antibiotics Itching    Family History  Problem Relation Age of Onset  . Heart disease Mother     ***  Prior to Admission medications   Medication Sig Start Date End Date Taking? Authorizing Provider  clopidogrel (PLAVIX) 75 MG tablet Take 75 mg by mouth daily before breakfast.     [provider]  digoxin (LANOXIN) 0.125 MG tablet Take 1 tablet (0.125 mg total) by mouth 2 (two) times a week. Patient taking differently: Take 0.125 mg by mouth every Monday, Wednesday, and Friday. 09/24/19   Belva Crome, MD  ELIQUIS 5 MG TABS tablet TAKE 1 TABLET BY MOUTH TWICE DAILY 01/18/20   Evans Lance, MD  furosemide (LASIX) 40 MG tablet Take 0.5 tablets (20 mg total) by mouth daily. May take a whole pill for 2-3 pound weight gain over night, shortness of breath or edema. 11/09/19   Bonnell Public,  Tarri Abernethy, PA-C  levothyroxine (SYNTHROID) 25 MCG tablet Take 1 tablet (25 mcg total) by mouth daily before breakfast. 02/04/20   Lyn Records, MD  metoprolol succinate (TOPROL XL) 100 MG 24 hr tablet Take 1 tablet (100 mg total) by mouth daily. 03/16/20   Marinus Maw, MD  nitroGLYCERIN (NITROSTAT) 0.4 MG SL tablet Place 1 tablet (0.4 mg total) under the tongue every 5 (five) minutes as needed. Patient taking differently: Place 0.4 mg under the tongue every 5 (five) minutes x 3 doses as needed for chest pain. 07/02/19   Arty Baumgartner, NP   potassium chloride (KLOR-CON) 10 MEQ tablet Take 1 tablet (10 mEq total) by mouth daily. May take 20 mg for 2-3 pound weight gain overnight, shortness of breath, edema 11/09/19 02/07/20  Dyann Kief, PA-C  rosuvastatin (CRESTOR) 40 MG tablet Take one tablet by mouth on Monday  And Friday 12/02/19   Lyn Records, MD  sacubitril-valsartan (ENTRESTO) 24-26 MG Take 1 tablet by mouth 2 (two) times daily. 09/30/19   Lyn Records, MD  spironolactone (ALDACTONE) 25 MG tablet Take 0.5 tablets (12.5 mg total) by mouth every Monday, Wednesday, and Friday. 08/26/19   Lyn Records, MD  vitamin B-12 (CYANOCOBALAMIN) 1000 MCG tablet Take 1,000 mcg by mouth daily.    [provider]    Physical Exam: BP 128/66 (BP Location: Right Arm)   Pulse 89   Temp 99.6 F (37.6 C) (Oral)   Resp (!) 26   Ht 5\' 2"  (1.575 m)   Wt 86.2 kg   SpO2 98%   BMI 34.75 kg/m   . General: 80 y.o. year-old female well developed well nourished in no acute distress.  Alert and oriented x3. . Cardiovascular: Regular rate and rhythm with no rubs or gallops.  No thyromegaly or JVD noted.  No lower extremity edema. 2/4 pulses in all 4 extremities. 70 Respiratory: Clear to auscultation with no wheezes or rales. Good inspiratory effort. . Abdomen: Soft nontender nondistended with normal bowel sounds x4 quadrants. . Muskuloskeletal: No cyanosis, clubbing or edema noted bilaterally . Neuro: CN II-XII intact, strength, sensation, reflexes . Skin: No ulcerative lesions noted or rashes . Psychiatry: Judgement and insight appear normal. Mood is appropriate for condition and setting          Labs on Admission:  Basic Metabolic Panel: Recent Labs  Lab 03/16/20 1227 03/19/20 2103 03/19/20 2220  NA 137 135 135  K 4.2 4.4 4.0  CL 103 103 104  CO2 23 23 21*  GLUCOSE 163* 136* 136*  BUN 20 22 22   CREATININE 1.63* 1.67* 1.49*  CALCIUM 9.2 8.9 8.8*   Liver Function Tests: Recent Labs  Lab 03/19/20 2220  AST 16  ALT  12  ALKPHOS 18*  BILITOT 1.4*  PROT 6.9  ALBUMIN 3.5   No results for input(s): LIPASE, AMYLASE in the last 168 hours. No results for input(s): AMMONIA in the last 168 hours. CBC: Recent Labs  Lab 03/16/20 1227 03/19/20 2103  WBC 8.9 7.3  NEUTROABS 6.3 4.8  HGB 10.7* 10.1*  HCT 34.1* 32.0*  MCV 100.0 99.7  PLT 221 166   Cardiac Enzymes: No results for input(s): CKTOTAL, CKMB, CKMBINDEX, TROPONINI in the last 168 hours.  BNP (last 3 results) Recent Labs    07/08/19 0204 03/16/20 1227 03/19/20 2103  BNP 1,020.0* 790.0* 1,686.0*    ProBNP (last 3 results) Recent Labs    07/15/19 0950 08/04/19 0955  PROBNP 11,777*  19,944*    CBG: No results for input(s): GLUCAP in the last 168 hours.  Radiological Exams on Admission: DG Chest Portable 1 View  Result Date: 03/19/2020 CLINICAL DATA:  Shortness of breath EXAM: PORTABLE CHEST 1 VIEW COMPARISON:  12/14/2019 FINDINGS: Left-sided ICD device as before. Enlarged cardiomediastinal silhouette with mild central congestion. Possible tiny pleural effusions. No overt edema. No consolidation or pneumothorax. IMPRESSION: Cardiomegaly with mild central congestion and possible tiny pleural effusions. Electronically Signed   By: Jasmine Pang M.D.   On: 03/19/2020 19:30    EKG: I independently viewed the EKG done and my findings are as followed: ***   Assessment/Plan Present on Admission: . COVID-19 virus infection  Active Problems:   COVID-19 virus infection   DVT prophylaxis: ***   Code Status: ***   Family Communication: ***   Disposition Plan: ***   Consults called: ***   Admission status: ***     Frankey Shown MD [facility] Pager [phone]  If 7PM-7AM, please contact night-coverage [email or web address] Password [facility]  03/19/2020, 11:31 PM

## 2020-03-19 NOTE — ED Notes (Signed)

## 2020-03-19 NOTE — ED Notes (Signed)
Pt placed on a [place] at this time.  

## 2020-03-19 NOTE — ED Notes (Signed)
Pt placed on 2L [place] at this time. This RN notes that patient is short of breath, unable to complete sentences with a full breath. Room oxygen noted to be 92% prior to 2L [place]. With 2L, patient's SpO2 improved to 95% at this time.

## 2020-03-19 NOTE — ED Notes (Signed)
Date and time results received: 03/19/20 1903  Test: COVID Critical Value: positive  Name of Provider Notified: Pilar Plate, MD  Orders Received? Or Actions Taken?: acknowledged

## 2020-03-19 NOTE — ED Notes (Signed)
Pt repositioned in bed and provided a warm blanket at this time.

## 2020-03-19 NOTE — ED Notes (Signed)
Patient is inpatient status, vital signs will be performed routine interval.

## 2020-03-19 NOTE — ED Provider Notes (Signed)
AP-EMERGENCY DEPT [facility] Emergency Department Provider Note MRN:  [id number]  Arrival date & time: 03/19/20     Chief Complaint   Shortness of Breath   History of Present Illness   Terry Hanson is a 80 y.o. year-old female with a history of CHF, diabetes presenting to the ED with chief complaint of shortness of.  4 days of shortness of breath, cough, subjective fever, body aches, malaise.  Initially thought it was her heart failure because she has recently run out of her Lasix.  Denies chest pain, no abdominal pain.  Symptoms are moderate to severe, constant, no other exacerbating or alleviating factors.  Review of Systems  A complete 10 system review of systems was obtained and all systems are negative except as noted in the HPI and PMH.   Patient's Health History    Past Medical History:  Diagnosis Date  . Arthritis   . Asthma   . CHF (congestive heart failure) (HCC)   . Diabetes mellitus (HCC)   . Diabetes mellitus without complication (HCC)   . Enlarged heart   . Hypercholesterolemia   . Hyperlipidemia   . Ischemic cardiomyopathy    a. EF 30-35% by echo in 06/2019  . STEMI (ST elevation myocardial infarction) (HCC)    a. 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    Past Surgical History:  Procedure Laterality Date  . ABDOMINAL HYSTERECTOMY    . CORONARY/GRAFT ACUTE MI REVASCULARIZATION N/A 06/29/2019   Procedure: Coronary/Graft Acute MI Revascularization;  Surgeon: Lyn Records, MD;  Location: [facility] INVASIVE CV LAB;  Service: Cardiovascular;  Laterality: N/A;  . ICD IMPLANT N/A 12/14/2019   Procedure: ICD IMPLANT;  Surgeon: Marinus Maw, MD;  Location: [facility] INVASIVE CV LAB;  Service: Cardiovascular;  Laterality: N/A;  . LEFT HEART CATH AND CORONARY ANGIOGRAPHY N/A 06/29/2019   Procedure: LEFT HEART CATH AND CORONARY ANGIOGRAPHY;  Surgeon: Lyn Records, MD;  Location: [facility] INVASIVE CV LAB;  Service: Cardiovascular;  Laterality: N/A;    Family History   Problem Relation Age of Onset  . Heart disease Mother     Social History   Socioeconomic History  . Marital status: Married    Spouse name: Not on file  . Number of children: Not on file  . Years of education: Not on file  . Highest education level: Not on file  Occupational History  . Not on file  Tobacco Use  . Smoking status: Never Smoker  . Smokeless tobacco: Former Neurosurgeon    Types: Engineer, drilling  . Vaping Use: Never used  Substance and Sexual Activity  . Alcohol use: Not Currently  . Drug use: Never  . Sexual activity: Not on file  Other Topics Concern  . Not on file  Social History Narrative   ** Merged History Encounter **       Social Determinants of Health   Financial Resource Strain: Not on file  Food Insecurity: Not on file  Transportation Needs: Not on file  Physical Activity: Not on file  Stress: Not on file  Social Connections: Not on file  Intimate Partner Violence: Not on file     Physical Exam   Vitals:   03/19/20 2100 03/19/20 2217  BP: 134/67 128/66  Pulse: 83 89  Resp: (!) 25 (!) 26  Temp:  99.6 F (37.6 C)  SpO2: 98% 98%    CONSTITUTIONAL: Chronically ill-appearing, NAD NEURO:  Alert and oriented x 3, no focal deficits EYES:  eyes equal and reactive ENT/NECK:  no LAD, no JVD CARDIO: Regular rate, well-perfused, normal S1 and S2 PULM:  CTAB no wheezing or rhonchi GI/GU:  normal bowel sounds, non-distended, non-tender MSK/SPINE:  No gross deformities, no edema SKIN:  no rash, atraumatic PSYCH:  Appropriate speech and behavior  *Additional and/or pertinent findings included in MDM below  Diagnostic and Interventional Summary    EKG Interpretation  Date/Time:  Saturday March 19 2020 17:59:00 EST Ventricular Rate:  90 PR Interval:    QRS Duration: 76 QT Interval:  310 QTC Calculation: 379 R Axis:   88 Text Interpretation: Atrial fibrillation Low voltage QRS Nonspecific ST and T wave abnormality Abnormal ECG Confirmed by  Kennis Carina [phone]) on 03/19/2020 8:41:15 PM      Labs Reviewed  RESP PANEL BY RT-PCR (FLU A&B, COVID) ARPGX2 - Abnormal; Notable for the following components:      Result Value   SARS Coronavirus 2 by RT PCR POSITIVE (*)    All other components within normal limits  BRAIN NATRIURETIC PEPTIDE - Abnormal; Notable for the following components:   B Natriuretic Peptide 1,686.0 (*)    All other components within normal limits  BASIC METABOLIC PANEL - Abnormal; Notable for the following components:   Glucose, Bld 136 (*)    Creatinine, Ser 1.67 (*)    GFR, Estimated 31 (*)    All other components within normal limits  CBC WITH DIFFERENTIAL/PLATELET - Abnormal; Notable for the following components:   RBC 3.21 (*)    Hemoglobin 10.1 (*)    HCT 32.0 (*)    All other components within normal limits  COMPREHENSIVE METABOLIC PANEL - Abnormal; Notable for the following components:   CO2 21 (*)    Glucose, Bld 136 (*)    Creatinine, Ser 1.49 (*)    Calcium 8.8 (*)    Alkaline Phosphatase 18 (*)    Total Bilirubin 1.4 (*)    GFR, Estimated 36 (*)    All other components within normal limits  D-DIMER, QUANTITATIVE (NOT AT [facility]) - Abnormal; Notable for the following components:   D-Dimer, Quant 1.01 (*)    All other components within normal limits  FIBRINOGEN - Abnormal; Notable for the following components:   Fibrinogen 602 (*)    All other components within normal limits  TROPONIN I (HIGH SENSITIVITY) - Abnormal; Notable for the following components:   Troponin I (High Sensitivity) 31 (*)    All other components within normal limits  CULTURE, BLOOD (ROUTINE X 2)  CULTURE, BLOOD (ROUTINE X 2)  LACTIC ACID, PLASMA  LACTATE DEHYDROGENASE  TRIGLYCERIDES  LACTIC ACID, PLASMA  PROCALCITONIN  FERRITIN  C-REACTIVE PROTEIN  TROPONIN I (HIGH SENSITIVITY)    DG Chest Portable 1 View  Final Result      Medications  furosemide (LASIX) injection 40 mg (40 mg Intravenous Given 03/19/20 2221)   dexamethasone (DECADRON) injection 6 mg (6 mg Intravenous Given 03/19/20 2219)     Procedures  /  Critical Care Procedures  ED Course and Medical Decision Making  I have reviewed the triage vital signs, the nursing notes, and pertinent available records from the EMR.  Listed above are laboratory and imaging tests that I personally ordered, reviewed, and interpreted and then considered in my medical decision making (see below for details).  Patient is Covid positive, she is desaturating down into the high 80s low 90s on room air, she is placed on 2 L nasal cannula.  There may be a component  of CHF causing patient's symptoms.  Will need admission.       Barth Kirks. [place]onia Small, [place] [email or web address]  Final Clinical Impressions(s) / ED Diagnoses     ICD-10-CM   1. Acute congestive heart failure, unspecified heart failure type ([place])  I50.9   2. COVID-19  U07.1     ED Discharge Orders    None       Discharge Instructions Discussed with and Provided to Patient:   Discharge Instructions   None       Maudie Flakes, MD 03/19/20 2313

## 2020-03-19 NOTE — ED Triage Notes (Signed)
Pt with SOB for "awhile", pt will not answer for how long.  Pt with hx of CHF.

## 2020-03-19 NOTE — H&P (Signed)
History and Physical  Terry Hanson WIO:[id number] DOB: July 16, 1940 DOA: 03/19/2020  Referring physician: Maudie Flakes, MD PCP: Lucia Gaskins, MD  Patient coming from: Home  Chief Complaint: Shortness of breath  HPI: Terry Hanson is a 80 y.o. female with medical history significant for HFrEF (LVEF in July 2021-30-35%), ICM s/p ICD placement, hypothyroidism, hyperlipidemia, atrial fibrillation and obesity who presents to the emergency department due to 4-day onset of shortness of breath, cough, body aches, malaise and subjective fever.  Shortness of breath worsens with ambulation, patient states that she ran out of her Lasix about 4 days ago and she believes that her symptoms are related to not taking Lasix.  She denies chest pain, abdominal pain, headache or having any sick contact.  ED Course: In the emergency department, she was intermittently tachypneic, BP was 105/90.  Work-up in the ED showed normocytic anemia, BUN/creatinine 22/1.675 (baseline creatinine 1.5-1.7).  BNP 1686 (this was 790 on 03/16/2020).  Troponin I-31 > 32.  Lactic acid was normal, procalcitonin < 0.10, ferritin 219, triglycerides 136.  SARS coronavirus 2 was positive. Chest x-ray showed cardiomegaly with mild central congestion and possible tiny pleural effusions. IV Lasix 40 Mg x1 was given, IV Decadron 6 mg x 1 was given.  Hospitalist was asked to admit patient for further evaluation and management.  Review of Systems: Constitutional: Negative for chills and fever.  HENT: Negative for ear pain and sore throat.   Eyes: Negative for pain and visual disturbance.  Respiratory: Positive for shortness of breath.   Cardiovascular: Negative for chest pain and palpitations.  Gastrointestinal: Negative for abdominal pain and vomiting.  Endocrine: Negative for polyphagia and polyuria.  Genitourinary: Negative for decreased urine volume, dysuria, enuresis Musculoskeletal: Negative for arthralgias and back pain.  Skin:  Negative for color change and rash.  Allergic/Immunologic: Negative for immunocompromised state.  Neurological: Negative for tremors, syncope, speech difficulty, weakness, light-headedness and headaches.  Hematological: Does not bruise/bleed easily.  All other systems reviewed and are negative  Past Medical History:  Diagnosis Date  . Arthritis   . Asthma   . CHF (congestive heart failure) ([place])   . Diabetes mellitus ([place])   . Diabetes mellitus without complication ([place])   . Enlarged heart   . Hypercholesterolemia   . Hyperlipidemia   . Ischemic cardiomyopathy    a. EF 30-35% by echo in 06/2019  . STEMI (ST elevation myocardial infarction) ([place])    a. 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   Past Surgical History:  Procedure Laterality Date  . ABDOMINAL HYSTERECTOMY    . CORONARY/GRAFT ACUTE MI REVASCULARIZATION N/A 06/29/2019   Procedure: Coronary/Graft Acute MI Revascularization;  Surgeon: Belva Crome, MD;  Location: [place] CV LAB;  Service: Cardiovascular;  Laterality: N/A;  . ICD IMPLANT N/A 12/14/2019   Procedure: ICD IMPLANT;  Surgeon: Evans Lance, MD;  Location: [place] CV LAB;  Service: Cardiovascular;  Laterality: N/A;  . LEFT HEART CATH AND CORONARY ANGIOGRAPHY N/A 06/29/2019   Procedure: LEFT HEART CATH AND CORONARY ANGIOGRAPHY;  Surgeon: Belva Crome, MD;  Location: [place] CV LAB;  Service: Cardiovascular;  Laterality: N/A;    Social History:  reports that she has never smoked. She has quit using smokeless tobacco.  Her smokeless tobacco use included chew. She reports previous alcohol use. She reports that she does not use drugs.   Allergies  Allergen Reactions  . Codeine Hives  . Shrimp [Shellfish Allergy] Other (See Comments)  Broke out  . Penicillins Itching and Rash    Did it involve swelling of the face/tongue/throat, SOB, or low BP? Yes Did it involve sudden or severe rash/hives, skin peeling, or any reaction on the inside of  your mouth or nose? Yes Did you need to seek medical attention at a hospital or doctor's office? Yes When did it last happen? Over 10 years If all above answers are "NO", may proceed with cephalosporin use.   . Sulfa Antibiotics Itching    Family History  Problem Relation Age of Onset  . Heart disease Mother     Prior to Admission medications   Medication Sig Start Date End Date Taking? Authorizing Provider  clopidogrel (PLAVIX) 75 MG tablet Take 75 mg by mouth daily before breakfast.     [provider]  digoxin (LANOXIN) 0.125 MG tablet Take 1 tablet (0.125 mg total) by mouth 2 (two) times a week. Patient taking differently: Take 0.125 mg by mouth every Monday, Wednesday, and Friday. 09/24/19   Lyn Records, MD  ELIQUIS 5 MG TABS tablet TAKE 1 TABLET BY MOUTH TWICE DAILY 01/18/20   Marinus Maw, MD  furosemide (LASIX) 40 MG tablet Take 0.5 tablets (20 mg total) by mouth daily. May take a whole pill for 2-3 pound weight gain over night, shortness of breath or edema. 11/09/19   Dyann Kief, PA-C  levothyroxine (SYNTHROID) 25 MCG tablet Take 1 tablet (25 mcg total) by mouth daily before breakfast. 02/04/20   Lyn Records, MD  metoprolol succinate (TOPROL XL) 100 MG 24 hr tablet Take 1 tablet (100 mg total) by mouth daily. 03/16/20   Marinus Maw, MD  nitroGLYCERIN (NITROSTAT) 0.4 MG SL tablet Place 1 tablet (0.4 mg total) under the tongue every 5 (five) minutes as needed. Patient taking differently: Place 0.4 mg under the tongue every 5 (five) minutes x 3 doses as needed for chest pain. 07/02/19   Arty Baumgartner, NP  potassium chloride (KLOR-CON) 10 MEQ tablet Take 1 tablet (10 mEq total) by mouth daily. May take 20 mg for 2-3 pound weight gain overnight, shortness of breath, edema 11/09/19 02/07/20  Dyann Kief, PA-C  rosuvastatin (CRESTOR) 40 MG tablet Take one tablet by mouth on Monday  And Friday 12/02/19   Lyn Records, MD  sacubitril-valsartan  (ENTRESTO) 24-26 MG Take 1 tablet by mouth 2 (two) times daily. 09/30/19   Lyn Records, MD  spironolactone (ALDACTONE) 25 MG tablet Take 0.5 tablets (12.5 mg total) by mouth every Monday, Wednesday, and Friday. 08/26/19   Lyn Records, MD  vitamin B-12 (CYANOCOBALAMIN) 1000 MCG tablet Take 1,000 mcg by mouth daily.    [provider]    Physical Exam: BP (!) 99/45   Pulse 87   Temp 98.4 F (36.9 C) (Oral)   Resp 18   Ht 5\' 2"  (1.575 m)   Wt 86.2 kg   SpO2 100%   BMI 34.75 kg/m   . General: 80 y.o. year-old female ill-appearing but in no acute distress.  Alert and oriented x3. 70 HEENT: NCAT, EOMI . Neck: Supple, trachea medial . Cardiovascular: Irregular rate and rhythm with no rubs or gallops.  ICD insertion site noted.  No thyromegaly or JVD noted.  2/4 pulses in all 4 extremities. Marland Kitchen Respiratory: Clear to auscultation with no wheezes or rales. Good inspiratory effort. . Abdomen: Soft nontender nondistended with normal bowel sounds x4 quadrants. . Muskuloskeletal: No cyanosis, clubbing or edema noted bilaterally .  Neuro: CN II-XII intact, strength, sensation, reflexes . Skin: No ulcerative lesions noted or rashes . Psychiatry: Judgement and insight appear normal. Mood is appropriate for condition and setting          Labs on Admission:  Basic Metabolic Panel: Recent Labs  Lab 03/16/20 1227 03/19/20 2103 03/19/20 2220  NA 137 135 135  K 4.2 4.4 4.0  CL 103 103 104  CO2 23 23 21*  GLUCOSE 163* 136* 136*  BUN 20 22 22   CREATININE 1.63* 1.67* 1.49*  CALCIUM 9.2 8.9 8.8*   Liver Function Tests: Recent Labs  Lab 03/19/20 2220  AST 16  ALT 12  ALKPHOS 18*  BILITOT 1.4*  PROT 6.9  ALBUMIN 3.5   No results for input(s): LIPASE, AMYLASE in the last 168 hours. No results for input(s): AMMONIA in the last 168 hours. CBC: Recent Labs  Lab 03/16/20 1227 03/19/20 2103  WBC 8.9 7.3  NEUTROABS 6.3 4.8  HGB 10.7* 10.1*  HCT 34.1* 32.0*  MCV 100.0 99.7   PLT 221 166   Cardiac Enzymes: No results for input(s): CKTOTAL, CKMB, CKMBINDEX, TROPONINI in the last 168 hours.  BNP (last 3 results) Recent Labs    07/08/19 0204 03/16/20 1227 03/19/20 2103  BNP 1,020.0* 790.0* 1,686.0*    ProBNP (last 3 results) Recent Labs    07/15/19 0950 08/04/19 0955  PROBNP 11,777* 19,944*    CBG: No results for input(s): GLUCAP in the last 168 hours.  Radiological Exams on Admission: DG Chest Portable 1 View  Result Date: 03/19/2020 CLINICAL DATA:  Shortness of breath EXAM: PORTABLE CHEST 1 VIEW COMPARISON:  12/14/2019 FINDINGS: Left-sided ICD device as before. Enlarged cardiomediastinal silhouette with mild central congestion. Possible tiny pleural effusions. No overt edema. No consolidation or pneumothorax. IMPRESSION: Cardiomegaly with mild central congestion and possible tiny pleural effusions. Electronically Signed   By: 12/16/2019 M.D.   On: 03/19/2020 19:30    EKG: I independently viewed the EKG done and my findings are as followed: Atrial fibrillation with rate control and nonspecific ST and T wave abnormality  Assessment/Plan Present on Admission: . COVID-19 virus infection . CKD (chronic kidney disease) stage 3, GFR 30-59 ml/min (HCC) . CAD (coronary artery disease) . Ischemic cardiomyopathy  Principal Problem:   Acute respiratory failure with hypercapnia (HCC) Active Problems:   Acute on chronic systolic CHF (congestive heart failure) (HCC)   Ischemic cardiomyopathy   CAD (coronary artery disease)   CKD (chronic kidney disease) stage 3, GFR 30-59 ml/min (HCC)   COVID-19 virus infection   Atrial fibrillation, chronic (HCC)  Acute respiratory failure with hypoxia possibly secondary to COVID-19 virus infection superimposed with presumed acute on chronic systolic CHF SARS coronavirus 2 was positive, she complained of shortness of breath with URI symptoms O2 sats at rest was 92% and patient complains of worsening shortness of  breath on ambulation Continue albuterol q.6h Continue IV Decadron 6 mg daily Continue IV Remdesivir per pharmacy protocol Continue vitamin-C 500 mg p.o. Daily Continue zinc 220 mg p.o. Daily Continue Mucinex, Robitussin  Continue Tylenol p.r.n. for fever Continue supplemental oxygen to maintain O2 sat > or = 94% with plan to wean patient off supplemental oxygen as tolerated (of note, patient does not use oxygen at baseline) Continue incentive spirometry and flutter valve q39min as tolerated Encourage proning, early ambulation, and side laying as tolerated Continue airborne isolation precaution Inflammatory markers: LDH:  184 CRP:  10.1 D-dimer: 1.01 Ferritin: 219 Continue monitoring daily inflammatory markers Physician  PPE:  Surgical mask with face shield, N-95, nonsterile gloves, disposable gown, head and shoe cover s Patient PPE:  Face mask   Presumed acute on chronic systolic CHF in the setting of above Chest x-ray showed cardiomegaly with mild central congestion and possible tiny pleural effusions BNP 1,686 (however, this may not be reliable considering patient's history of CKD) IV Lasix 40 mg x 1 was given with subsequent soft BP; Lasix will be temporarily held at this time Toprol, Entresto, Aldactone will be temporarily held due to soft BP Continue total input/output, daily weights and fluid restriction Continue Cardiac diet  Echocardiogram(LVEF in July 2021-30-35%), echocardiogram will be repeated in the morning   Chronic atrial fibrillation EKG showed atrial fibrillation with rate control Continue Eliquis Toprol will be temporarily held due to soft BP  CAD Continue Plavix and statin  Hypothyroidism Continue Synthroid  CKD stage IIIb BUN/creatinine 22/1.675 (baseline creatinine 1.5-1.7).  Renally adjust medications, avoid nephrotoxic agents/dehydration/hypotension  Obesity (BMI 34.75) Patient will be counseled on diet and lifestyle medication when more  stable   DVT prophylaxis: Eliquis  Code Status: Full code  Family Communication: None at bedside  Disposition Plan:  Patient is from:                        home Anticipated DC to:                   SNF or family members home Anticipated DC date:               2-3 days Anticipated DC barriers:           Patient is unstable to be discharged at this time due to COVID-19 virus pneumonia and CHF requiring inpatient management   Consults called: None  Admission status: Inpatient  Frankey Shown MD [facility]  03/20/2020, 2:53 AM

## 2020-03-20 ENCOUNTER — Inpatient Hospital Stay (HOSPITAL_COMMUNITY): Payer: Medicare Other

## 2020-03-20 DIAGNOSIS — I482 Chronic atrial fibrillation, unspecified: Secondary | ICD-10-CM

## 2020-03-20 DIAGNOSIS — I5021 Acute systolic (congestive) heart failure: Secondary | ICD-10-CM | POA: Diagnosis not present

## 2020-03-20 DIAGNOSIS — J9602 Acute respiratory failure with hypercapnia: Secondary | ICD-10-CM

## 2020-03-20 LAB — CBC WITH DIFFERENTIAL/PLATELET
Abs Immature Granulocytes: 0.02 10*3/uL (ref 0.00–0.07)
Basophils Absolute: 0 10*3/uL (ref 0.0–0.1)
Basophils Relative: 0 %
Eosinophils Absolute: 0 10*3/uL (ref 0.0–0.5)
Eosinophils Relative: 0 %
HCT: 30.4 % — ABNORMAL LOW (ref 36.0–46.0)
Hemoglobin: 9.4 g/dL — ABNORMAL LOW (ref 12.0–15.0)
Immature Granulocytes: 1 %
Lymphocytes Relative: 18 %
Lymphs Abs: 0.8 10*3/uL (ref 0.7–4.0)
MCH: 30.8 pg (ref 26.0–34.0)
MCHC: 30.9 g/dL (ref 30.0–36.0)
MCV: 99.7 fL (ref 80.0–100.0)
Monocytes Absolute: 0.4 10*3/uL (ref 0.1–1.0)
Monocytes Relative: 10 %
Neutro Abs: 3 10*3/uL (ref 1.7–7.7)
Neutrophils Relative %: 71 %
Platelets: 153 10*3/uL (ref 150–400)
RBC: 3.05 MIL/uL — ABNORMAL LOW (ref 3.87–5.11)
RDW: 13.8 % (ref 11.5–15.5)
WBC: 4.2 10*3/uL (ref 4.0–10.5)
nRBC: 0 % (ref 0.0–0.2)

## 2020-03-20 LAB — COMPREHENSIVE METABOLIC PANEL
ALT: 10 U/L (ref 0–44)
AST: 13 U/L — ABNORMAL LOW (ref 15–41)
Albumin: 3 g/dL — ABNORMAL LOW (ref 3.5–5.0)
Alkaline Phosphatase: 19 U/L — ABNORMAL LOW (ref 38–126)
Anion gap: 5 (ref 5–15)
BUN: 26 mg/dL — ABNORMAL HIGH (ref 8–23)
CO2: 26 mmol/L (ref 22–32)
Calcium: 8.6 mg/dL — ABNORMAL LOW (ref 8.9–10.3)
Chloride: 102 mmol/L (ref 98–111)
Creatinine, Ser: 1.63 mg/dL — ABNORMAL HIGH (ref 0.44–1.00)
GFR, Estimated: 32 mL/min — ABNORMAL LOW (ref >60)
Glucose, Bld: 182 mg/dL — ABNORMAL HIGH (ref 70–99)
Potassium: 4.8 mmol/L (ref 3.5–5.1)
Sodium: 133 mmol/L — ABNORMAL LOW (ref 135–145)
Total Bilirubin: 1.3 mg/dL — ABNORMAL HIGH (ref 0.3–1.2)
Total Protein: 6.5 g/dL (ref 6.5–8.1)

## 2020-03-20 LAB — LACTIC ACID, PLASMA: Lactic Acid, Venous: 1.3 mmol/L (ref 0.5–1.9)

## 2020-03-20 LAB — ECHOCARDIOGRAM LIMITED
Calc EF: 33.4 %
Height: 62 in
S' Lateral: 4 cm
Single Plane A2C EF: 41.2 %
Single Plane A4C EF: 29.6 %
Weight: 3040 oz

## 2020-03-20 LAB — C-REACTIVE PROTEIN: CRP: 13.6 mg/dL — ABNORMAL HIGH (ref <1.0)

## 2020-03-20 LAB — PHOSPHORUS: Phosphorus: 4.4 mg/dL (ref 2.5–4.6)

## 2020-03-20 LAB — D-DIMER, QUANTITATIVE: D-Dimer, Quant: 0.96 ug/mL-FEU — ABNORMAL HIGH (ref 0.00–0.50)

## 2020-03-20 LAB — FERRITIN: Ferritin: 221 ng/mL (ref 11–307)

## 2020-03-20 LAB — TROPONIN I (HIGH SENSITIVITY): Troponin I (High Sensitivity): 32 ng/L — ABNORMAL HIGH (ref <18)

## 2020-03-20 LAB — MAGNESIUM: Magnesium: 2.1 mg/dL (ref 1.7–2.4)

## 2020-03-20 MED ORDER — DEXAMETHASONE SODIUM PHOSPHATE 10 MG/ML IJ SOLN
6.0000 mg | INTRAMUSCULAR | Status: DC
  Administered 2020-03-20 – 2020-03-21 (×2): 6 mg via INTRAVENOUS
  Filled 2020-03-20 (×3): qty 1

## 2020-03-20 MED ORDER — ZINC SULFATE 220 (50 ZN) MG PO CAPS
220.0000 mg | ORAL_CAPSULE | Freq: Every day | ORAL | Status: DC
  Administered 2020-03-20 – 2020-03-21 (×2): 220 mg via ORAL
  Filled 2020-03-20 (×3): qty 1

## 2020-03-20 MED ORDER — PERFLUTREN LIPID MICROSPHERE
1.0000 mL | INTRAVENOUS | Status: AC | PRN
  Administered 2020-03-20: 3 mL via INTRAVENOUS
  Filled 2020-03-20: qty 10

## 2020-03-20 MED ORDER — SPIRONOLACTONE 25 MG PO TABS
12.5000 mg | ORAL_TABLET | ORAL | Status: DC
  Filled 2020-03-20: qty 0.5

## 2020-03-20 MED ORDER — ALBUTEROL SULFATE HFA 108 (90 BASE) MCG/ACT IN AERS
2.0000 | INHALATION_SPRAY | Freq: Three times a day (TID) | RESPIRATORY_TRACT | Status: DC
  Administered 2020-03-21: 2 via RESPIRATORY_TRACT

## 2020-03-20 MED ORDER — NYSTATIN 100000 UNIT/GM EX POWD
Freq: Two times a day (BID) | CUTANEOUS | Status: DC
  Filled 2020-03-20: qty 15

## 2020-03-20 MED ORDER — DM-GUAIFENESIN ER 30-600 MG PO TB12
1.0000 | ORAL_TABLET | Freq: Two times a day (BID) | ORAL | Status: DC
  Administered 2020-03-20 – 2020-03-21 (×4): 1 via ORAL
  Filled 2020-03-20 (×4): qty 1

## 2020-03-20 MED ORDER — SODIUM CHLORIDE 0.9 % IV SOLN
100.0000 mg | Freq: Every day | INTRAVENOUS | Status: DC
  Administered 2020-03-21: 100 mg via INTRAVENOUS
  Filled 2020-03-20: qty 20

## 2020-03-20 MED ORDER — REMDESIVIR 100 MG IV SOLR
100.0000 mg | INTRAVENOUS | Status: AC
  Administered 2020-03-20 (×2): 100 mg via INTRAVENOUS
  Filled 2020-03-20 (×2): qty 20

## 2020-03-20 MED ORDER — FUROSEMIDE 20 MG PO TABS: 20.0000 mg | ORAL_TABLET | Freq: Every day | ORAL | Status: DC

## 2020-03-20 MED ORDER — FUROSEMIDE 10 MG/ML IJ SOLN
20.0000 mg | Freq: Two times a day (BID) | INTRAMUSCULAR | Status: DC
  Administered 2020-03-20: 20 mg via INTRAVENOUS
  Filled 2020-03-20: qty 2

## 2020-03-20 MED ORDER — SPIRONOLACTONE 12.5 MG HALF TABLET: 12.5000 mg | ORAL_TABLET | ORAL | Status: DC

## 2020-03-20 MED ORDER — ASCORBIC ACID 500 MG PO TABS
500.0000 mg | ORAL_TABLET | Freq: Every day | ORAL | Status: DC
  Administered 2020-03-20 – 2020-03-21 (×2): 500 mg via ORAL
  Filled 2020-03-20 (×3): qty 1

## 2020-03-20 MED ORDER — GUAIFENESIN-DM 100-10 MG/5ML PO SYRP: 5.0000 mL | ORAL_SOLUTION | ORAL | Status: DC | PRN

## 2020-03-20 MED ORDER — ALBUTEROL SULFATE HFA 108 (90 BASE) MCG/ACT IN AERS
2.0000 | INHALATION_SPRAY | Freq: Four times a day (QID) | RESPIRATORY_TRACT | Status: DC
  Administered 2020-03-20 (×3): 2 via RESPIRATORY_TRACT
  Filled 2020-03-20: qty 6.7

## 2020-03-20 NOTE — Progress Notes (Signed)
Patient has a yeasty appearing rash under the left breast fold. Informed Dr Thomes Dinning of above finding via [facility]

## 2020-03-20 NOTE — Progress Notes (Signed)
  Echocardiogram 2D Echocardiogram has been performed.  Terry Hanson 03/20/2020, 8:36 AM

## 2020-03-20 NOTE — Progress Notes (Signed)
PROGRESS NOTE    Terry Hanson  JGO:[id number] DOB: 08-23-1940 DOA: 03/19/2020 PCP: [name], MD   Brief Narrative:  Terry Hanson is a 80 y.o. female with medical history significant for HFrEF (LVEF in July 2021-30-35%), ICM s/p ICD placement, hypothyroidism, hyperlipidemia, atrial fibrillation and obesity who presents to the emergency department due to 4-day onset of shortness of breath, cough, body aches, malaise and subjective fever.  Patient is admitted with acute hypoxemic respiratory failure secondary to COVID-19 pneumonia as well as acute on chronic systolic CHF exacerbation.  She appears to be feeling somewhat better this morning and remains on 2 L nasal cannula oxygen.  Assessment & Plan:   Principal Problem:   Acute respiratory failure with hypercapnia (HCC) Active Problems:   Acute on chronic systolic CHF (congestive heart failure) (HCC)   Ischemic cardiomyopathy   CAD (coronary artery disease)   CKD (chronic kidney disease) stage 3, GFR 30-59 ml/min (HCC)   COVID-19 virus infection   Atrial fibrillation, chronic (HCC)   Acute respiratory failure with hypoxia possibly secondary to COVID-19 virus infection superimposed with presumed acute on chronic systolic CHF SARS coronavirus 2 was positive, she complained of shortness of breath with URI symptoms O2 sats at rest was 92% and patient complains of worsening shortness of breath on ambulation Continue albuterol q.6h Continue IV Decadron 6 mg daily Continue IV Remdesivir per pharmacy protocol Continue vitamin-C 500 mg p.o. Daily Continue zinc 220 mg p.o. Daily Continue Mucinex, Robitussin  Continue Tylenol p.r.n. for fever Continue supplemental oxygen to maintain O2 sat > or = 94% with plan to wean patient off supplemental oxygen as tolerated (of note, patient does not use oxygen at baseline) Continue incentive spirometry and flutter valve q30min as tolerated Encourage proning, early ambulation, and side laying as  tolerated Continue airborne isolation precaution Continue monitoring daily inflammatory markers Physician PPE:  Surgical mask with face shield, N-95, nonsterile gloves, disposable gown, head and shoe cover s Patient PPE:  Face mask   Presumed acute on chronic systolic CHF in the setting of above Chest x-ray showed cardiomegaly with mild central congestion and possible tiny pleural effusions BNP 1,686 (however, this may not be reliable considering patient's history of CKD) IV Lasix 40 mg given on admission and another 20 mg given on 1/2 Toprol and Entresto given soft blood pressure readings, plan to monitor closely on Aldactone starting 1/3 Continue total input/output, daily weights and fluid restriction Continue Cardiac diet  Repeat 2D echocardiogram with noted LVEF 25-30% with significant LV dysfunction continue to 30-35% previously. Appreciate cardiology consultation for a.m.  Chronic atrial fibrillation EKG showed atrial fibrillation with rate control Continue Eliquis Toprol will be temporarily held due to soft BP  CAD Continue Plavix and statin  Hypothyroidism Continue Synthroid  CKD stage IIIb BUN/creatinine 22/1.675 (baseline creatinine 1.5-1.7).  Renally adjust medications, avoid nephrotoxic agents/dehydration/hypotension  Obesity (BMI 34.75) Patient will be counseled on diet and lifestyle medication when more stable   DVT prophylaxis: Eliquis Code Status: Full Family Communication: Discussed with daughter on phone 1/2 Disposition Plan:  Status is: Inpatient  Remains inpatient appropriate because:IV treatments appropriate due to intensity of illness or inability to take PO and Inpatient level of care appropriate due to severity of illness   Dispo: The patient is from: Home              Anticipated d/c is to: Home              Anticipated d/c date is:  2 days              Patient currently is not medically stable to d/c.  Patient requires ongoing treatment  for COVID-19 pneumonia as well as IV diuresis.  Appreciate cardiology consultation in a.m. to help manage medications.  Consultants:   Cardiology in am  Procedures:   See below  2D echocardiogram with significant LV dysfunction noted LVEF 25-30% 1/2  Antimicrobials:  Anti-infectives (From admission, onward)   Start     Dose/Rate Route Frequency Ordered Stop   03/21/20 1000  remdesivir 100 mg in sodium chloride 0.9 % 100 mL IVPB       "Followed by" Linked Group Details   100 mg 200 mL/hr over 30 Minutes Intravenous Daily 03/20/20 0302 03/25/20 0959   03/20/20 0330  remdesivir 100 mg in sodium chloride 0.9 % 100 mL IVPB       "Followed by" Linked Group Details   100 mg 200 mL/hr over 30 Minutes Intravenous Every 30 min 03/20/20 0302 03/20/20 0500       Subjective: Patient seen and evaluated today and appears to be stable short of breath.  She remains on 2 L nasal cannula oxygen.  She is still noted to have some mild edema.  Objective: Vitals:   03/20/20 0600 03/20/20 0849 03/20/20 1136 03/20/20 1140  BP: 102/70     Pulse:      Resp:      Temp:      TempSrc:      SpO2:  100% 100% 96%  Weight: 86.2 kg     Height:        Intake/Output Summary (Last 24 hours) at 03/20/2020 1253 Last data filed at 03/20/2020 1100 Gross per 24 hour  Intake 580 ml  Output 1250 ml  Net -670 ml   Filed Weights   03/19/20 1800 03/20/20 0600  Weight: 86.2 kg 86.2 kg    Examination:  General exam: Appears calm and comfortable, obese Respiratory system: Clear to auscultation. Respiratory effort normal.  Currently on 2 L nasal cannula oxygen Cardiovascular system: S1 & S2 heard, RRR.  Gastrointestinal system: Abdomen is soft Central nervous system: Alert and awake Extremities: Scant bilateral edema Skin: No significant lesions noted Psychiatry: Flat affect.    Data Reviewed: I have personally reviewed following labs and imaging studies  CBC: Recent Labs  Lab 03/16/20 1227  03/19/20 2103 03/20/20 0824  WBC 8.9 7.3 4.2  NEUTROABS 6.3 4.8 3.0  HGB 10.7* 10.1* 9.4*  HCT 34.1* 32.0* 30.4*  MCV 100.0 99.7 99.7  PLT 221 166 153   Basic Metabolic Panel: Recent Labs  Lab 03/16/20 1227 03/19/20 2103 03/19/20 2220 03/20/20 0824  NA 137 135 135 133*  K 4.2 4.4 4.0 4.8  CL 103 103 104 102  CO2 23 23 21* 26  GLUCOSE 163* 136* 136* 182*  BUN 20 22 22  26*  CREATININE 1.63* 1.67* 1.49* 1.63*  CALCIUM 9.2 8.9 8.8* 8.6*  MG  --   --   --  2.1  PHOS  --   --   --  4.4   GFR: Estimated Creatinine Clearance: 29 mL/min (A) (by C-G formula based on SCr of 1.63 mg/dL (H)). Liver Function Tests: Recent Labs  Lab 03/19/20 2220 03/20/20 0824  AST 16 13*  ALT 12 10  ALKPHOS 18* 19*  BILITOT 1.4* 1.3*  PROT 6.9 6.5  ALBUMIN 3.5 3.0*   No results for input(s): LIPASE, AMYLASE in the last 168 hours. No results  for input(s): AMMONIA in the last 168 hours. Coagulation Profile: No results for input(s): INR, PROTIME in the last 168 hours. Cardiac Enzymes: No results for input(s): CKTOTAL, CKMB, CKMBINDEX, TROPONINI in the last 168 hours. BNP (last 3 results) Recent Labs    07/15/19 0950 08/04/19 0955  PROBNP 11,777* 19,944*   HbA1C: No results for input(s): HGBA1C in the last 72 hours. CBG: No results for input(s): GLUCAP in the last 168 hours. Lipid Profile: Recent Labs    03/19/20 2220  TRIG 136   Thyroid Function Tests: No results for input(s): TSH, T4TOTAL, FREET4, T3FREE, THYROIDAB in the last 72 hours. Anemia Panel: Recent Labs    03/19/20 2220 03/20/20 0824  FERRITIN 219 221   Sepsis Labs: Recent Labs  Lab 03/19/20 2220 03/19/20 2243 03/20/20 0135  PROCALCITON <0.10  --   --   LATICACIDVEN  --  1.1 1.3    Recent Results (from the past 240 hour(s))  Resp Panel by RT-PCR (Flu A&B, Covid) Nasopharyngeal Swab     Status: Abnormal   Collection Time: 03/19/20  6:09 PM   Specimen: Nasopharyngeal Swab; Nasopharyngeal(NP) swabs in vial  transport medium  Result Value Ref Range Status   SARS Coronavirus 2 by RT PCR POSITIVE (A) NEGATIVE Final    Comment: RESULT CALLED TO, READ BACK BY AND VERIFIED WITH: WALKER,TONI @1901  03/19/20 BY JONES,TAYLOR (NOTE) SARS-CoV-2 target nucleic acids are DETECTED.  The SARS-CoV-2 RNA is generally detectable in upper respiratory specimens during the acute phase of infection. Positive results are indicative of the presence of the identified virus, but do not rule out bacterial infection or co-infection with other pathogens not detected by the test. Clinical correlation with patient history and other diagnostic information is necessary to determine patient infection status. The expected result is Negative.  Fact Sheet for Patients: 05/17/20  Fact Sheet for Healthcare Providers: BloggerCourse.com  This test is not yet approved or cleared by the [employer] FDA and  has been authorized for detection and/or diagnosis of SARS-CoV-2 by FDA under an Emergency Use Authorization (EUA).  This EUA will remain in effect (meaning this test  can be used) for the duration of  the COVID-19 declaration under Section 564(b)(1) of the Act, 21 U.S.C. section 360bbb-3(b)(1), unless the authorization is terminated or revoked sooner.     Influenza A by PCR NEGATIVE NEGATIVE Final   Influenza B by PCR NEGATIVE NEGATIVE Final    Comment: (NOTE) The Xpert Xpress SARS-CoV-2/FLU/RSV plus assay is intended as an aid in the diagnosis of influenza from Nasopharyngeal swab specimens and should not be used as a sole basis for treatment. Nasal washings and aspirates are unacceptable for Xpert Xpress SARS-CoV-2/FLU/RSV testing.  Fact Sheet for Patients: [place]  Fact Sheet for Healthcare Providers: BloggerCourse.com  This test is not yet approved or cleared by the [employer] FDA and has  been authorized for detection and/or diagnosis of SARS-CoV-2 by FDA under an Emergency Use Authorization (EUA). This EUA will remain in effect (meaning this test can be used) for the duration of the COVID-19 declaration under Section 564(b)(1) of the Act, 21 U.S.C. section 360bbb-3(b)(1), unless the authorization is terminated or revoked.  Performed at [facility], [address], [place], [place]   Blood Culture (routine x 2)     Status: None (Preliminary result)   Collection Time: 03/19/20 10:20 PM   Specimen: BLOOD LEFT FOREARM  Result Value Ref Range Status   Specimen Description BLOOD LEFT FOREARM  Final  Special Requests   Final    BOTTLES DRAWN AEROBIC AND ANAEROBIC Blood Culture adequate volume   Culture   Final    NO GROWTH < 12 HOURS Performed at [facility], [address], [place], [place] [address]    Report Status PENDING  Incomplete  Blood Culture (routine x 2)     Status: None (Preliminary result)   Collection Time: 03/20/20  1:35 AM   Specimen: Right Antecubital; Blood  Result Value Ref Range Status   Specimen Description RIGHT ANTECUBITAL  Final   Special Requests   Final    BOTTLES DRAWN AEROBIC AND ANAEROBIC Blood Culture results may not be optimal due to an excessive volume of blood received in culture bottles   Culture   Final    NO GROWTH < 12 HOURS Performed at [facility], [address], [place], [place] [address]    Report Status PENDING  Incomplete         Radiology Studies: DG Chest Portable 1 View  Result Date: 03/19/2020 CLINICAL DATA:  Shortness of breath EXAM: PORTABLE CHEST 1 VIEW COMPARISON:  12/14/2019 FINDINGS: Left-sided ICD device as before. Enlarged cardiomediastinal silhouette with mild central congestion. Possible tiny pleural effusions. No overt edema. No consolidation or pneumothorax. IMPRESSION: Cardiomegaly with mild central congestion and possible tiny pleural effusions. Electronically Signed   By: Jasmine Pang M.D.    On: 03/19/2020 19:30   ECHOCARDIOGRAM LIMITED  Result Date: 03/20/2020    ECHOCARDIOGRAM LIMITED REPORT   Patient Name:   Terry Hanson Date of Exam: 03/20/2020 Medical Rec #:  [id number]   Height:       62.0 in Accession #:    [id number]  Weight:       190.0 lb Date of Birth:  Jul 27, 1940    BSA:          1.870 m Patient Age:    79 years    BP:           102/70 mmHg Patient Gender: F           HR:           59 bpm. [facility] Procedure: Limited Echo, Cardiac Doppler, Color Doppler and Intracardiac            Opacification Agent Indications:    CHF-Acute Systolic I50.21  History:        Patient has prior history of Echocardiogram examinations, most                 recent 09/23/2019. CHF and Cardiomyopathy, Arrythmias:STEMI; Risk                 Factors:Diabetes, Dyslipidemia and Non-Smoker.  Sonographer:    Renella Cunas RDCS Referring Phys: [id number] OLADAPO ADEFESO  Sonographer Comments: Covid positive. IMPRESSIONS  1. Akinesis of the anterior, septal and apical walls with overall severe LV dysfunction; no apical thrombus noted using definity.  2. Left ventricular ejection fraction, by estimation, is 25 to 30%. The left ventricle has severely decreased function. The left ventricle demonstrates regional wall motion abnormalities (see scoring diagram/findings for description). Left ventricular diastolic function could not be evaluated.  3. Right ventricular systolic function is normal. The right ventricular size is normal. There is normal pulmonary artery systolic pressure.  4. The mitral valve is normal in structure. Mild mitral valve regurgitation. No evidence of mitral stenosis.  5. The aortic valve has an indeterminant number of cusps. Aortic valve regurgitation is not visualized. No  aortic stenosis is present.  6. The inferior vena cava is normal in size with greater than 50% respiratory variability, suggesting right atrial pressure of 3 mmHg. FINDINGS  Left Ventricle: Left ventricular ejection fraction,  by estimation, is 25 to 30%. The left ventricle has severely decreased function. The left ventricle demonstrates regional wall motion abnormalities. Definity contrast agent was given IV to delineate the left ventricular endocardial borders. The left ventricular internal cavity size was normal in size. There is no left ventricular hypertrophy. Left ventricular diastolic function could not be evaluated. Left ventricular diastolic function could not be  evaluated due to atrial fibrillation. Right Ventricle: The right ventricular size is normal. Right ventricular systolic function is normal. There is normal pulmonary artery systolic pressure. The tricuspid regurgitant velocity is 1.47 m/s, and with an assumed right atrial pressure of 3 mmHg,  the estimated right ventricular systolic pressure is 11.6 mmHg. Left Atrium: Left atrial size was normal in size. Right Atrium: Right atrial size was normal in size. Pericardium: There is no evidence of pericardial effusion. Mitral Valve: The mitral valve is normal in structure. Mild mitral valve regurgitation. No evidence of mitral valve stenosis. Tricuspid Valve: The tricuspid valve is normal in structure. Tricuspid valve regurgitation is mild . No evidence of tricuspid stenosis. Aortic Valve: The aortic valve has an indeterminant number of cusps. Aortic valve regurgitation is not visualized. No aortic stenosis is present. Pulmonic Valve: The pulmonic valve was not well visualized. Pulmonic valve regurgitation is trivial. No evidence of pulmonic stenosis. Aorta: The aortic root is normal in size and structure. Venous: The inferior vena cava is normal in size with greater than 50% respiratory variability, suggesting right atrial pressure of 3 mmHg.  Additional Comments: Akinesis of the anterior, septal and apical walls with overall severe LV dysfunction; no apical thrombus noted using definity. A pacer wire is visualized. LEFT VENTRICLE PLAX 2D LVIDd:         4.61 cm LVIDs:          4.00 cm LV PW:         0.75 cm LV IVS:        0.69 cm LVOT diam:     2.00 cm LV SV:         47 LV SV Index:   25 LVOT Area:     3.14 cm  LV Volumes (MOD) LV vol d, MOD A2C: 177.0 ml LV vol d, MOD A4C: 142.0 ml LV vol s, MOD A2C: 104.0 ml LV vol s, MOD A4C: 99.9 ml LV SV MOD A2C:     73.0 ml LV SV MOD A4C:     142.0 ml LV SV MOD BP:      52.8 ml RIGHT VENTRICLE TAPSE (M-mode): 1.3 cm LEFT ATRIUM         Index LA diam:    3.30 cm 1.76 cm/m  AORTIC VALVE LVOT Vmax:   80.70 cm/s LVOT Vmean:  49.700 cm/s LVOT VTI:    0.151 m  AORTA Ao Root diam: 3.10 cm TRICUSPID VALVE TR Peak grad:   8.6 mmHg TR Vmax:        147.00 cm/s  SHUNTS Systemic VTI:  0.15 m Systemic Diam: 2.00 cm Olga Millers MD Electronically signed by Olga Millers MD Signature Date/Time: 03/20/2020/9:08:29 AM    Final         Scheduled Meds: . albuterol  2 puff Inhalation Q6H  . vitamin C  500 mg Oral Daily  . dexamethasone (DECADRON) injection  6 mg Intravenous Q24H  . dextromethorphan-guaiFENesin  1 tablet Oral BID  . [START ON 03/21/2020] furosemide  20 mg Oral Daily  . nystatin   Topical BID  . [START ON 03/21/2020] spironolactone  12.5 mg Oral Q M,W,F  . zinc sulfate  220 mg Oral Daily   Continuous Infusions: . [START ON 03/21/2020] remdesivir 100 mg in NS 100 mL       LOS: 1 day    Time spent: 35 minutes    Daqwan Dougal Darleen Crocker, DO [facility]  If 7PM-7AM, please contact night-coverage [email or web address] 03/20/2020, 12:53 PM

## 2020-03-21 DIAGNOSIS — J9602 Acute respiratory failure with hypercapnia: Secondary | ICD-10-CM | POA: Diagnosis not present

## 2020-03-21 LAB — COMPREHENSIVE METABOLIC PANEL
ALT: 11 U/L (ref 0–44)
AST: 15 U/L (ref 15–41)
Albumin: 3.1 g/dL — ABNORMAL LOW (ref 3.5–5.0)
Alkaline Phosphatase: 18 U/L — ABNORMAL LOW (ref 38–126)
Anion gap: 12 (ref 5–15)
BUN: 43 mg/dL — ABNORMAL HIGH (ref 8–23)
CO2: 24 mmol/L (ref 22–32)
Calcium: 8.8 mg/dL — ABNORMAL LOW (ref 8.9–10.3)
Chloride: 99 mmol/L (ref 98–111)
Creatinine, Ser: 1.74 mg/dL — ABNORMAL HIGH (ref 0.44–1.00)
GFR, Estimated: 30 mL/min — ABNORMAL LOW (ref >60)
Glucose, Bld: 155 mg/dL — ABNORMAL HIGH (ref 70–99)
Potassium: 4.4 mmol/L (ref 3.5–5.1)
Sodium: 135 mmol/L (ref 135–145)
Total Bilirubin: 0.7 mg/dL (ref 0.3–1.2)
Total Protein: 6.3 g/dL — ABNORMAL LOW (ref 6.5–8.1)

## 2020-03-21 LAB — CBC WITH DIFFERENTIAL/PLATELET
Abs Immature Granulocytes: 0.03 10*3/uL (ref 0.00–0.07)
Basophils Absolute: 0 10*3/uL (ref 0.0–0.1)
Basophils Relative: 0 %
Eosinophils Absolute: 0 10*3/uL (ref 0.0–0.5)
Eosinophils Relative: 0 %
HCT: 30.6 % — ABNORMAL LOW (ref 36.0–46.0)
Hemoglobin: 9.7 g/dL — ABNORMAL LOW (ref 12.0–15.0)
Immature Granulocytes: 1 %
Lymphocytes Relative: 13 %
Lymphs Abs: 0.7 10*3/uL (ref 0.7–4.0)
MCH: 31.1 pg (ref 26.0–34.0)
MCHC: 31.7 g/dL (ref 30.0–36.0)
MCV: 98.1 fL (ref 80.0–100.0)
Monocytes Absolute: 0.6 10*3/uL (ref 0.1–1.0)
Monocytes Relative: 11 %
Neutro Abs: 3.8 10*3/uL (ref 1.7–7.7)
Neutrophils Relative %: 75 %
Platelets: 164 10*3/uL (ref 150–400)
RBC: 3.12 MIL/uL — ABNORMAL LOW (ref 3.87–5.11)
RDW: 13.8 % (ref 11.5–15.5)
WBC: 5.1 10*3/uL (ref 4.0–10.5)
nRBC: 0 % (ref 0.0–0.2)

## 2020-03-21 LAB — MAGNESIUM: Magnesium: 2.1 mg/dL (ref 1.7–2.4)

## 2020-03-21 LAB — D-DIMER, QUANTITATIVE: D-Dimer, Quant: 1.02 ug/mL-FEU — ABNORMAL HIGH (ref 0.00–0.50)

## 2020-03-21 LAB — FERRITIN: Ferritin: 240 ng/mL (ref 11–307)

## 2020-03-21 LAB — C-REACTIVE PROTEIN: CRP: 11.5 mg/dL — ABNORMAL HIGH (ref <1.0)

## 2020-03-21 LAB — PHOSPHORUS: Phosphorus: 3.8 mg/dL (ref 2.5–4.6)

## 2020-03-21 MED ORDER — DEXAMETHASONE 6 MG PO TABS: 6.0000 mg | ORAL_TABLET | Freq: Every day | ORAL | 0 refills | Status: AC

## 2020-03-21 MED ORDER — METOPROLOL SUCCINATE ER 25 MG PO TB24: 25.0000 mg | ORAL_TABLET | Freq: Every day | ORAL | 3 refills | Status: DC

## 2020-03-21 MED ORDER — NYSTATIN 100000 UNIT/GM EX POWD: Freq: Two times a day (BID) | CUTANEOUS | 0 refills | Status: DC

## 2020-03-21 MED ORDER — FUROSEMIDE 20 MG PO TABS: 20.0000 mg | ORAL_TABLET | Freq: Every day | ORAL | 3 refills | Status: DC

## 2020-03-21 MED ORDER — SACUBITRIL-VALSARTAN 24-26 MG PO TABS
1.0000 | ORAL_TABLET | Freq: Two times a day (BID) | ORAL | Status: DC
  Administered 2020-03-21: 1 via ORAL
  Filled 2020-03-21: qty 1

## 2020-03-21 MED ORDER — GUAIFENESIN-DM 100-10 MG/5ML PO SYRP: 5.0000 mL | ORAL_SOLUTION | ORAL | 0 refills | Status: DC | PRN

## 2020-03-21 MED ORDER — ASCORBIC ACID 500 MG PO TABS: 500.0000 mg | ORAL_TABLET | Freq: Every day | ORAL | 0 refills | Status: AC

## 2020-03-21 MED ORDER — ZINC SULFATE 220 (50 ZN) MG PO CAPS: 220.0000 mg | ORAL_CAPSULE | Freq: Every day | ORAL | 0 refills | Status: AC

## 2020-03-21 MED ORDER — LEVALBUTEROL TARTRATE 45 MCG/ACT IN AERO: 1.0000 | INHALATION_SPRAY | RESPIRATORY_TRACT | 2 refills | Status: DC | PRN

## 2020-03-21 MED ORDER — APIXABAN 5 MG PO TABS
5.0000 mg | ORAL_TABLET | Freq: Two times a day (BID) | ORAL | Status: DC
  Administered 2020-03-21: 5 mg via ORAL
  Filled 2020-03-21: qty 1

## 2020-03-21 MED ORDER — METOPROLOL SUCCINATE ER 25 MG PO TB24
25.0000 mg | ORAL_TABLET | Freq: Every day | ORAL | Status: DC
  Administered 2020-03-21: 25 mg via ORAL
  Filled 2020-03-21: qty 1

## 2020-03-21 NOTE — Discharge Summary (Signed)
Physician Discharge Summary  Terry QuinonesSadie W Hooley WUJ:[id number] DOB: 02/08/1941 DOA: 03/19/2020  PCP: Oval Linseyondiego, Richard, MD  Admit date: 03/19/2020  Discharge date: 03/21/2020  Admitted From:Home  Disposition:  Home  Recommendations for Outpatient Follow-up:  1. Follow up with PCP in 1-2 weeks 2. Follow-up with cardiologist Dr. Katrinka BlazingSmith in the next 2-3 weeks with appointment requested 3. Continue on home medications as prescribed with metoprolol dose reduced per cardiology due to some hypotension.  Blood pressure has improved and okay to resume other home medications at this tim  4. Remain on Decadron as prescribed for several more days as well as Xopenex as prescribed for any shortness of breath or wheezing 5. Lasix refilled as patient has been out for his prior to admission  Home Health: None  Equipment/Devices: None  Discharge Condition:Stable  CODE STATUS: Full  Diet recommendation: Heart Healthy  Brief/Interim Summary: 47Sadie W Coxis a 80 y.o.femalewith medical history significant forHFrEF (LVEF in July 2021-30-35%), ICMs/pICD placement, hypothyroidism, hyperlipidemia, atrial fibrillation and obesity who presents to the emergency department due to 4-day onset of shortness of breath, cough, body aches, malaise and subjective fever.  Patient is admitted with acute hypoxemic respiratory failure secondary to COVID-19 pneumonia as well as acute on chronic systolic CHF exacerbation.  She was initially noted to be on 2 L nasal cannula oxygen and had done well overnight after her initial admission.  She diuresed well with IV Lasix, but was noted to have some hypotension for which her blood pressure medications were held.  This allowed her blood pressure to improve over the course of another day and she subsequently felt much better.  She has diuresed slightly over a liter of fluid and was noted to have a combination of symptoms from not only her heart failure, but also for Covid pneumonia.  She is  currently off any further oxygen and it appears to be euvolemic.  She is to continue her usual home doses of medications as noted below with Lasix refills given.  Metoprolol dose has also been reduced per cardiology.  2D echocardiogram performed with findings of LVEF 25-30% which is near her usual baseline of 30-35%.  No further orthopnea or chest pain or cough noted.  She will need close follow-up with her cardiologist in the outpatient setting.  Discharge Diagnoses:  Principal Problem:   Acute respiratory failure with hypercapnia (HCC) Active Problems:   Acute on chronic systolic CHF (congestive heart failure) (HCC)   Ischemic cardiomyopathy   CAD (coronary artery disease)   CKD (chronic kidney disease) stage 3, GFR 30-59 ml/min (HCC)   COVID-19 virus infection   Atrial fibrillation, chronic (HCC)  Principal discharge diagnosis: Acute hypoxemic respiratory failure multifactorial in the setting of COVID-19 pneumonia as well as acute on chronic systolic CHF exacerbation.  Discharge Instructions  Discharge Instructions    Diet - low sodium heart healthy   Complete by: As directed    Increase activity slowly   Complete by: As directed      Allergies as of 03/21/2020      Reactions   Codeine Hives   Shrimp [shellfish Allergy] Other (See Comments)   Broke out   Penicillins Itching, Rash   Did it involve swelling of the face/tongue/throat, SOB, or low BP? Yes Did it involve sudden or severe rash/hives, skin peeling, or any reaction on the inside of your mouth or nose? Yes Did you need to seek medical attention at a hospital or doctor's office? Yes When did it last happen? Over  10 years If all above answers are "NO", may proceed with cephalosporin use.   Sulfa Antibiotics Itching      Medication List    TAKE these medications   ascorbic acid 500 MG tablet Commonly known as: VITAMIN C Take 1 tablet (500 mg total) by mouth daily for 15 days. Start taking on: March 22, 2020    clopidogrel 75 MG tablet Commonly known as: PLAVIX Take 75 mg by mouth daily before breakfast.   dexamethasone 6 MG tablet Commonly known as: DECADRON Take 1 tablet (6 mg total) by mouth daily for 7 days.   digoxin 0.125 MG tablet Commonly known as: LANOXIN Take 1 tablet (0.125 mg total) by mouth 2 (two) times a week. What changed: when to take this   Eliquis 5 MG Tabs tablet Generic drug: apixaban TAKE 1 TABLET BY MOUTH TWICE DAILY What changed: how much to take   Entresto 24-26 MG Generic drug: sacubitril-valsartan Take 1 tablet by mouth 2 (two) times daily.   furosemide 20 MG tablet Commonly known as: Lasix Take 1 tablet (20 mg total) by mouth daily. May take a whole pill for 2-3 pound weight gain over night, shortness of breath or edema. What changed: medication strength   guaiFENesin-dextromethorphan 100-10 MG/5ML syrup Commonly known as: ROBITUSSIN DM Take 5 mLs by mouth every 4 (four) hours as needed for cough.   levalbuterol 45 MCG/ACT inhaler Commonly known as: XOPENEX HFA Inhale 1 puff into the lungs every 4 (four) hours as needed for wheezing.   levothyroxine 25 MCG tablet Commonly known as: SYNTHROID Take 1 tablet (25 mcg total) by mouth daily before breakfast.   metoprolol succinate 25 MG 24 hr tablet Commonly known as: TOPROL-XL Take 1 tablet (25 mg total) by mouth daily. Start taking on: March 22, 2020 What changed:   medication strength  how much to take   nitroGLYCERIN 0.4 MG SL tablet Commonly known as: Nitrostat Place 1 tablet (0.4 mg total) under the tongue every 5 (five) minutes as needed. What changed:   when to take this  reasons to take this   nystatin powder Commonly known as: MYCOSTATIN/NYSTOP Apply topically 2 (two) times daily.   potassium chloride 10 MEQ tablet Commonly known as: KLOR-CON Take 1 tablet (10 mEq total) by mouth daily. May take 20 mg for 2-3 pound weight gain overnight, shortness of breath, edema    rosuvastatin 40 MG tablet Commonly known as: CRESTOR Take one tablet by mouth on Monday  And Friday What changed:   how much to take  how to take this  when to take this   spironolactone 25 MG tablet Commonly known as: ALDACTONE Take 0.5 tablets (12.5 mg total) by mouth every Monday, Wednesday, and Friday.   vitamin B-12 1000 MCG tablet Commonly known as: CYANOCOBALAMIN Take 1,000 mcg by mouth daily.   zinc sulfate 220 (50 Zn) MG capsule Take 1 capsule (220 mg total) by mouth daily for 15 days. Start taking on: March 22, 2020       Follow-up Information    [name], MD. Schedule an appointment as soon as possible for a visit in 2 week(s).   Specialty: Internal Medicine Contact information: [address] [place] [address] [phone]        Lyn Records, MD. Schedule an appointment as soon as possible for a visit in 2 week(s).   Specialty: Cardiology Contact information: [address] [place] [address] [phone]  Allergies  Allergen Reactions  . Codeine Hives  . Shrimp [Shellfish Allergy] Other (See Comments)    Broke out  . Penicillins Itching and Rash    Did it involve swelling of the face/tongue/throat, SOB, or low BP? Yes Did it involve sudden or severe rash/hives, skin peeling, or any reaction on the inside of your mouth or nose? Yes Did you need to seek medical attention at a hospital or doctor's office? Yes When did it last happen? Over 10 years If all above answers are "NO", may proceed with cephalosporin use.   . Sulfa Antibiotics Itching    Consultations:  Cardiology   Procedures/Studies: DG Chest Portable 1 View  Result Date: 03/19/2020 CLINICAL DATA:  Shortness of breath EXAM: PORTABLE CHEST 1 VIEW COMPARISON:  12/14/2019 FINDINGS: Left-sided ICD device as before. Enlarged cardiomediastinal silhouette with mild central congestion. Possible tiny pleural effusions. No  overt edema. No consolidation or pneumothorax. IMPRESSION: Cardiomegaly with mild central congestion and possible tiny pleural effusions. Electronically Signed   By: Jasmine Pang M.D.   On: 03/19/2020 19:30   CUP PACEART INCLINIC DEVICE CHECK  Result Date: 03/16/2020 ICD check in clinic. Normal device function. Thresholds and sensing consistent with previous device measurements. Impedance trends stable over time. 1 VT-1 Zone event logged, with max VR of 185 bpm, duration 2 minutes, possibly AF w/ RVR. Histogram distribution appropriate for patient and level of activity. RV output decreased to 2.5 V for chronic lead settings, allowing 2:1 safety margin. Device programmed at appropriate safety margins. Estimated longevity 8.8 years. Pt enrolled in remote follow-up 06/13/20. Patient education completed. ROV w/ GT/R.Sharia Reeve, BSN, RN  CUP PACEART REMOTE DEVICE CHECK  Result Date: 03/14/2020 Scheduled remote reviewed. 1 NSVT event appears a fib w/ rvr, duration 2 seconds.  Normal device function.  R. Powers, LPN, CVRS Next remote 91 days.  ECHOCARDIOGRAM LIMITED  Result Date: 03/20/2020    ECHOCARDIOGRAM LIMITED REPORT   Patient Name:   Terry Hanson Soloway Date of Exam: 03/20/2020 Medical Rec #:  [id number]   Height:       62.0 in Accession #:    [id number]  Weight:       190.0 lb Date of Birth:  March 01, 1941    BSA:          1.870 m Patient Age:    79 years    BP:           102/70 mmHg Patient Gender: F           HR:           59 bpm. [facility] Procedure: Limited Echo, Cardiac Doppler, Color Doppler and Intracardiac            Opacification Agent Indications:    CHF-Acute Systolic I50.21  History:        Patient has prior history of Echocardiogram examinations, most                 recent 09/23/2019. CHF and Cardiomyopathy, Arrythmias:STEMI; Risk                 Factors:Diabetes, Dyslipidemia and Non-Smoker.  Sonographer:    Renella Cunas RDCS Referring Phys: [id number] OLADAPO ADEFESO  Sonographer  Comments: Covid positive. IMPRESSIONS  1. Akinesis of the anterior, septal and apical walls with overall severe LV dysfunction; no apical thrombus noted using definity.  2. Left ventricular ejection fraction, by estimation, is 25 to 30%. The left ventricle has severely decreased function. The  left ventricle demonstrates regional wall motion abnormalities (see scoring diagram/findings for description). Left ventricular diastolic function could not be evaluated.  3. Right ventricular systolic function is normal. The right ventricular size is normal. There is normal pulmonary artery systolic pressure.  4. The mitral valve is normal in structure. Mild mitral valve regurgitation. No evidence of mitral stenosis.  5. The aortic valve has an indeterminant number of cusps. Aortic valve regurgitation is not visualized. No aortic stenosis is present.  6. The inferior vena cava is normal in size with greater than 50% respiratory variability, suggesting right atrial pressure of 3 mmHg. FINDINGS  Left Ventricle: Left ventricular ejection fraction, by estimation, is 25 to 30%. The left ventricle has severely decreased function. The left ventricle demonstrates regional wall motion abnormalities. Definity contrast agent was given IV to delineate the left ventricular endocardial borders. The left ventricular internal cavity size was normal in size. There is no left ventricular hypertrophy. Left ventricular diastolic function could not be evaluated. Left ventricular diastolic function could not be  evaluated due to atrial fibrillation. Right Ventricle: The right ventricular size is normal. Right ventricular systolic function is normal. There is normal pulmonary artery systolic pressure. The tricuspid regurgitant velocity is 1.47 m/s, and with an assumed right atrial pressure of 3 mmHg,  the estimated right ventricular systolic pressure is 11.6 mmHg. Left Atrium: Left atrial size was normal in size. Right Atrium: Right atrial size was  normal in size. Pericardium: There is no evidence of pericardial effusion. Mitral Valve: The mitral valve is normal in structure. Mild mitral valve regurgitation. No evidence of mitral valve stenosis. Tricuspid Valve: The tricuspid valve is normal in structure. Tricuspid valve regurgitation is mild . No evidence of tricuspid stenosis. Aortic Valve: The aortic valve has an indeterminant number of cusps. Aortic valve regurgitation is not visualized. No aortic stenosis is present. Pulmonic Valve: The pulmonic valve was not well visualized. Pulmonic valve regurgitation is trivial. No evidence of pulmonic stenosis. Aorta: The aortic root is normal in size and structure. Venous: The inferior vena cava is normal in size with greater than 50% respiratory variability, suggesting right atrial pressure of 3 mmHg.  Additional Comments: Akinesis of the anterior, septal and apical walls with overall severe LV dysfunction; no apical thrombus noted using definity. A pacer wire is visualized. LEFT VENTRICLE PLAX 2D LVIDd:         4.61 cm LVIDs:         4.00 cm LV PW:         0.75 cm LV IVS:        0.69 cm LVOT diam:     2.00 cm LV SV:         47 LV SV Index:   25 LVOT Area:     3.14 cm  LV Volumes (MOD) LV vol d, MOD A2C: 177.0 ml LV vol d, MOD A4C: 142.0 ml LV vol s, MOD A2C: 104.0 ml LV vol s, MOD A4C: 99.9 ml LV SV MOD A2C:     73.0 ml LV SV MOD A4C:     142.0 ml LV SV MOD BP:      52.8 ml RIGHT VENTRICLE TAPSE (M-mode): 1.3 cm LEFT ATRIUM         Index LA diam:    3.30 cm 1.76 cm/m  AORTIC VALVE LVOT Vmax:   80.70 cm/s LVOT Vmean:  49.700 cm/s LVOT VTI:    0.151 m  AORTA Ao Root diam: 3.10 cm TRICUSPID VALVE TR Peak  grad:   8.6 mmHg TR Vmax:        147.00 cm/s  SHUNTS Systemic VTI:  0.15 m Systemic Diam: 2.00 cm Kirk Ruths MD Electronically signed by Kirk Ruths MD Signature Date/Time: 03/20/2020/9:08:29 AM    Final       Discharge Exam: Vitals:   03/21/20 0900 03/21/20 0909  BP: (!) 116/54   Pulse: (!) 121 95   Resp:    Temp:    SpO2: 98%    Vitals:   03/21/20 0829 03/21/20 0837 03/21/20 0900 03/21/20 0909  BP: (!) 108/55  (!) 116/54   Pulse: 75  (!) 121 95  Resp: 18     Temp: 97.7 F (36.5 C)     TempSrc: Oral     SpO2: 100% 94% 98%   Weight:      Height:        General: Pt is alert, awake, not in acute distress, obese Cardiovascular: RRR, S1/S2 +, no rubs, no gallops Respiratory: CTA bilaterally, no wheezing, no rhonchi Abdominal: Soft, NT, ND, bowel sounds + Extremities: no edema, no cyanosis    The results of significant diagnostics from this hospitalization (including imaging, microbiology, ancillary and laboratory) are listed below for reference.     Microbiology: Recent Results (from the past 240 hour(s))  Resp Panel by RT-PCR (Flu A&B, Covid) Nasopharyngeal Swab     Status: Abnormal   Collection Time: 03/19/20  6:09 PM   Specimen: Nasopharyngeal Swab; Nasopharyngeal(NP) swabs in vial transport medium  Result Value Ref Range Status   SARS Coronavirus 2 by RT PCR POSITIVE (A) NEGATIVE Final    Comment: RESULT CALLED TO, READ BACK BY AND VERIFIED WITH: Trilby Leaver @1901  03/19/20 BY JONES,TAYLOR (NOTE) SARS-CoV-2 target nucleic acids are DETECTED.  The SARS-CoV-2 RNA is generally detectable in upper respiratory specimens during the acute phase of infection. Positive results are indicative of the presence of the identified virus, but do not rule out bacterial infection or co-infection with other pathogens not detected by the test. Clinical correlation with patient history and other diagnostic information is necessary to determine patient infection status. The expected result is Negative.  Fact Sheet for Patients: EntrepreneurPulse.com.au  Fact Sheet for Healthcare Providers: IncredibleEmployment.be  This test is not yet approved or cleared by the [place] FDA and  has been authorized for detection and/or diagnosis of  SARS-CoV-2 by FDA under an Emergency Use Authorization (EUA).  This EUA will remain in effect (meaning this test  can be used) for the duration of  the COVID-19 declaration under Section 564(b)(1) of the Act, 21 U.S.C. section 360bbb-3(b)(1), unless the authorization is terminated or revoked sooner.     Influenza A by PCR NEGATIVE NEGATIVE Final   Influenza B by PCR NEGATIVE NEGATIVE Final    Comment: (NOTE) The Xpert Xpress SARS-CoV-2/FLU/RSV plus assay is intended as an aid in the diagnosis of influenza from Nasopharyngeal swab specimens and should not be used as a sole basis for treatment. Nasal washings and aspirates are unacceptable for Xpert Xpress SARS-CoV-2/FLU/RSV testing.  Fact Sheet for Patients: EntrepreneurPulse.com.au  Fact Sheet for Healthcare Providers: IncredibleEmployment.be  This test is not yet approved or cleared by the [place] FDA and has been authorized for detection and/or diagnosis of SARS-CoV-2 by FDA under an Emergency Use Authorization (EUA). This EUA will remain in effect (meaning this test can be used) for the duration of the COVID-19 declaration under Section 564(b)(1) of the Act, 21 U.S.C. section 360bbb-3(b)(1), unless the authorization is terminated  or revoked.  Performed at [facility], [address] [place], [place] [address]   Blood Culture (routine x 2)     Status: None (Preliminary result)   Collection Time: 03/19/20 10:20 PM   Specimen: BLOOD LEFT FOREARM  Result Value Ref Range Status   Specimen Description BLOOD LEFT FOREARM  Final   Special Requests   Final    BOTTLES DRAWN AEROBIC AND ANAEROBIC Blood Culture adequate volume   Culture   Final    NO GROWTH 2 DAYS Performed at [facility], [address] [place], [place] [address]    Report Status PENDING  Incomplete  Blood Culture (routine x 2)     Status: None (Preliminary result)   Collection Time: 03/20/20  1:35 AM   Specimen: Right  Antecubital; Blood  Result Value Ref Range Status   Specimen Description RIGHT ANTECUBITAL  Final   Special Requests   Final    BOTTLES DRAWN AEROBIC AND ANAEROBIC Blood Culture results may not be optimal due to an excessive volume of blood received in culture bottles   Culture   Final    NO GROWTH 1 DAY Performed at [facility], [address] [place], [place] [address]    Report Status PENDING  Incomplete     Labs: BNP (last 3 results) Recent Labs    07/08/19 0204 03/16/20 1227 03/19/20 2103  BNP 1,020.0* 790.0* 1,686.0*   Basic Metabolic Panel: Recent Labs  Lab 03/16/20 1227 03/19/20 2103 03/19/20 2220 03/20/20 0824 03/21/20 0332  NA 137 135 135 133* 135  K 4.2 4.4 4.0 4.8 4.4  CL 103 103 104 102 99  CO2 23 23 21* 26 24  GLUCOSE 163* 136* 136* 182* 155*  BUN 20 22 22  26* 43*  CREATININE 1.63* 1.67* 1.49* 1.63* 1.74*  CALCIUM 9.2 8.9 8.8* 8.6* 8.8*  MG  --   --   --  2.1 2.1  PHOS  --   --   --  4.4 3.8   Liver Function Tests: Recent Labs  Lab 03/19/20 2220 03/20/20 0824 03/21/20 0332  AST 16 13* 15  ALT 12 10 11   ALKPHOS 18* 19* 18*  BILITOT 1.4* 1.3* 0.7  PROT 6.9 6.5 6.3*  ALBUMIN 3.5 3.0* 3.1*   No results for input(s): LIPASE, AMYLASE in the last 168 hours. No results for input(s): AMMONIA in the last 168 hours. CBC: Recent Labs  Lab 03/16/20 1227 03/19/20 2103 03/20/20 0824 03/21/20 0332  WBC 8.9 7.3 4.2 5.1  NEUTROABS 6.3 4.8 3.0 3.8  HGB 10.7* 10.1* 9.4* 9.7*  HCT 34.1* 32.0* 30.4* 30.6*  MCV 100.0 99.7 99.7 98.1  PLT 221 166 153 164   Cardiac Enzymes: No results for input(s): CKTOTAL, CKMB, CKMBINDEX, TROPONINI in the last 168 hours. BNP: Invalid input(s): POCBNP CBG: No results for input(s): GLUCAP in the last 168 hours. D-Dimer Recent Labs    03/20/20 0824 03/21/20 0332  DDIMER 0.96* 1.02*   Hgb A1c No results for input(s): HGBA1C in the last 72 hours. Lipid Profile Recent Labs    03/19/20 2220  TRIG 136   Thyroid  function studies No results for input(s): TSH, T4TOTAL, T3FREE, THYROIDAB in the last 72 hours.  Invalid input(s): FREET3 Anemia work up Recent Labs    03/20/20 0824 03/21/20 0332  FERRITIN 221 240   Urinalysis    Component Value Date/Time   COLORURINE AMBER (A) 07/08/2019 0737   APPEARANCEUR CLOUDY (A) 07/08/2019 0737   LABSPEC 1.019 07/08/2019 [id number]  PHURINE 5.0 07/08/2019 0737   GLUCOSEU NEGATIVE 07/08/2019 0737   HGBUR MODERATE (A) 07/08/2019 0737   BILIRUBINUR NEGATIVE 07/08/2019 0737   KETONESUR NEGATIVE 07/08/2019 0737   PROTEINUR 100 (A) 07/08/2019 0737   NITRITE NEGATIVE 07/08/2019 0737   LEUKOCYTESUR NEGATIVE 07/08/2019 0737   Sepsis Labs Invalid input(s): PROCALCITONIN,  WBC,  LACTICIDVEN Microbiology Recent Results (from the past 240 hour(s))  Resp Panel by RT-PCR (Flu A&B, Covid) Nasopharyngeal Swab     Status: Abnormal   Collection Time: 03/19/20  6:09 PM   Specimen: Nasopharyngeal Swab; Nasopharyngeal(NP) swabs in vial transport medium  Result Value Ref Range Status   SARS Coronavirus 2 by RT PCR POSITIVE (A) NEGATIVE Final    Comment: RESULT CALLED TO, READ BACK BY AND VERIFIED WITH: WALKER,TONI @1901  03/19/20 BY JONES,TAYLOR (NOTE) SARS-CoV-2 target nucleic acids are DETECTED.  The SARS-CoV-2 RNA is generally detectable in upper respiratory specimens during the acute phase of infection. Positive results are indicative of the presence of the identified virus, but do not rule out bacterial infection or co-infection with other pathogens not detected by the test. Clinical correlation with patient history and other diagnostic information is necessary to determine patient infection status. The expected result is Negative.  Fact Sheet for Patients: 05/17/20  Fact Sheet for Healthcare Providers: BloggerCourse.com  This test is not yet approved or cleared by the [place] FDA and  has been  authorized for detection and/or diagnosis of SARS-CoV-2 by FDA under an Emergency Use Authorization (EUA).  This EUA will remain in effect (meaning this test  can be used) for the duration of  the COVID-19 declaration under Section 564(b)(1) of the Act, 21 U.S.C. section 360bbb-3(b)(1), unless the authorization is terminated or revoked sooner.     Influenza A by PCR NEGATIVE NEGATIVE Final   Influenza B by PCR NEGATIVE NEGATIVE Final    Comment: (NOTE) The Xpert Xpress SARS-CoV-2/FLU/RSV plus assay is intended as an aid in the diagnosis of influenza from Nasopharyngeal swab specimens and should not be used as a sole basis for treatment. Nasal washings and aspirates are unacceptable for Xpert Xpress SARS-CoV-2/FLU/RSV testing.  Fact Sheet for Patients: [place]  Fact Sheet for Healthcare Providers: BloggerCourse.com  This test is not yet approved or cleared by the [place] FDA and has been authorized for detection and/or diagnosis of SARS-CoV-2 by FDA under an Emergency Use Authorization (EUA). This EUA will remain in effect (meaning this test can be used) for the duration of the COVID-19 declaration under Section 564(b)(1) of the Act, 21 U.S.C. section 360bbb-3(b)(1), unless the authorization is terminated or revoked.  Performed at [facility], [address] [place], [place]   Blood Culture (routine x 2)     Status: None (Preliminary result)   Collection Time: 03/19/20 10:20 PM   Specimen: BLOOD LEFT FOREARM  Result Value Ref Range Status   Specimen Description BLOOD LEFT FOREARM  Final   Special Requests   Final    BOTTLES DRAWN AEROBIC AND ANAEROBIC Blood Culture adequate volume   Culture   Final    NO GROWTH 2 DAYS Performed at [facility], [address], [place], [place]    Report Status PENDING  Incomplete  Blood Culture (routine x 2)     Status: None (Preliminary result)    Collection Time: 03/20/20  1:35 AM   Specimen: Right Antecubital; Blood  Result Value Ref Range Status   Specimen Description RIGHT ANTECUBITAL  Final   Special Requests  Final    BOTTLES DRAWN AEROBIC AND ANAEROBIC Blood Culture results may not be optimal due to an excessive volume of blood received in culture bottles   Culture   Final    NO GROWTH 1 DAY Performed at [facility], [address], [place], [place] [address]    Report Status PENDING  Incomplete     Time coordinating discharge: 35 minutes  SIGNED:   Erick Blinks, DO [facility] 03/21/2020, 10:40 AM  If 7PM-7AM, please contact night-coverage [email or web address]

## 2020-03-21 NOTE — Consult Note (Signed)
CARDIOLOGY CONSULT NOTE       Patient ID: Terry Hanson MRN: [id number] DOB/AGE: [id number] y.o.  Admit date: 03/19/2020 Referring Physician: Manuella Ghazi Primary Physician: Lucia Gaskins, MD Primary Cardiologist: Harrington Challenger Reason for Consultation: CHF  Principal Problem:   Acute respiratory failure with hypercapnia [facility]) Active Problems:   Acute on chronic systolic CHF (congestive heart failure) (HCC)   Ischemic cardiomyopathy   CAD (coronary artery disease)   CKD (chronic kidney disease) stage 3, GFR 30-59 ml/min ([place])   COVID-19 virus infection   Atrial fibrillation, chronic (HCC)   HPI:  80 y.o. with history of ischemic DCM post anterior MI with stent to LAD and residual EF 30-35% April 2021 Now post AICD with history of PAF. Started on entresto July 2021 She is on Eliquis for anticoagulation. Admitted with COVID typical symptoms fever malaise, dyspnea and myalgias. She ran out of her lasix 4 days prior to admission with BNP over 1600. Has diuresed and feels back to baseline Does not think she had COVID. Currently no dyspnea, chest pain palpitations or edema.   ROS All other systems reviewed and negative except as noted above  Past Medical History:  Diagnosis Date  . Arthritis   . Asthma   . CHF (congestive heart failure) ([place])   . Diabetes mellitus ([place])   . Diabetes mellitus without complication ([place])   . Enlarged heart   . Hypercholesterolemia   . Hyperlipidemia   . Ischemic cardiomyopathy    a. EF 30-35% by echo in 06/2019  . STEMI (ST elevation myocardial infarction) ([place])    a. 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    Family History  Problem Relation Age of Onset  . Heart disease Mother     Social History   Socioeconomic History  . Marital status: Married    Spouse name: Not on file  . Number of children: Not on file  . Years of education: Not on file  . Highest education level: Not on file  Occupational History  . Not on file  Tobacco Use  .  Smoking status: Never Smoker  . Smokeless tobacco: Former Systems developer    Types: Secondary school teacher  . Vaping Use: Never used  Substance and Sexual Activity  . Alcohol use: Not Currently  . Drug use: Never  . Sexual activity: Not on file  Other Topics Concern  . Not on file  Social History Narrative   ** Merged History Encounter **       Social Determinants of Health   Financial Resource Strain: Not on file  Food Insecurity: Not on file  Transportation Needs: Not on file  Physical Activity: Not on file  Stress: Not on file  Social Connections: Not on file  Intimate Partner Violence: Not on file    Past Surgical History:  Procedure Laterality Date  . ABDOMINAL HYSTERECTOMY    . CORONARY/GRAFT ACUTE MI REVASCULARIZATION N/A 06/29/2019   Procedure: Coronary/Graft Acute MI Revascularization;  Surgeon: Belva Crome, MD;  Location: [facility];  Service: Cardiovascular;  Laterality: N/A;  . ICD IMPLANT N/A 12/14/2019   Procedure: ICD IMPLANT;  Surgeon: Evans Lance, MD;  Location: [place] [facility];  Service: Cardiovascular;  Laterality: N/A;  . LEFT HEART CATH AND CORONARY ANGIOGRAPHY N/A 06/29/2019   Procedure: LEFT HEART CATH AND CORONARY ANGIOGRAPHY;  Surgeon: Belva Crome, MD;  Location: [place] [facility];  Service: Cardiovascular;  Laterality: N/A;      Current  Facility-Administered Medications:  .  acetaminophen (TYLENOL) tablet 650 mg, 650 mg, Oral, Q6H PRN, Adefeso, Oladapo, DO .  albuterol (VENTOLIN HFA) 108 (90 Base) MCG/ACT inhaler 2 puff, 2 puff, Inhalation, TID, Sherryll Burger, Pratik D, DO, 2 puff at 03/21/20 0836 .  ascorbic acid (VITAMIN C) tablet 500 mg, 500 mg, Oral, Daily, Adefeso, Oladapo, DO, 500 mg at 03/20/20 1022 .  dexamethasone (DECADRON) injection 6 mg, 6 mg, Intravenous, Q24H, Adefeso, Oladapo, DO, 6 mg at 03/20/20 1024 .  dextromethorphan-guaiFENesin (MUCINEX DM) 30-600 MG per 12 hr tablet 1 tablet, 1 tablet, Oral, BID, Adefeso, Oladapo, DO, 1 tablet at  03/20/20 2103 .  guaiFENesin-dextromethorphan (ROBITUSSIN DM) 100-10 MG/5ML syrup 5 mL, 5 mL, Oral, Q4H PRN, Adefeso, Oladapo, DO .  nystatin (MYCOSTATIN/NYSTOP) topical powder, , Topical, BID, Adefeso, Oladapo, DO, Given at 03/20/20 2103 .  [COMPLETED] remdesivir 100 mg in sodium chloride 0.9 % 100 mL IVPB, 100 mg, Intravenous, Q30 min, Last Rate: 200 mL/hr at 03/20/20 0430, 100 mg at 03/20/20 0430 **FOLLOWED BY** remdesivir 100 mg in sodium chloride 0.9 % 100 mL IVPB, 100 mg, Intravenous, Daily, Adefeso, Oladapo, DO .  zinc sulfate capsule 220 mg, 220 mg, Oral, Daily, Adefeso, Oladapo, DO, 220 mg at 03/20/20 1021 . albuterol  2 puff Inhalation TID  . vitamin C  500 mg Oral Daily  . dexamethasone (DECADRON) injection  6 mg Intravenous Q24H  . dextromethorphan-guaiFENesin  1 tablet Oral BID  . nystatin   Topical BID  . zinc sulfate  220 mg Oral Daily   . remdesivir 100 mg in NS 100 mL      Physical Exam: Blood pressure (!) 108/55, pulse 75, temperature 97.7 F (36.5 C), temperature source Oral, resp. rate 18, height 5\' 2"  (1.575 m), weight 86.2 kg, SpO2 94 %.    Affect appropriate Elderly female  HEENT: normal Neck supple with no adenopathy JVP normal no bruits no thyromegaly Lungs clear with no wheezing and good diaphragmatic motion Heart:  S1/S2 SEM  murmur, no rub, gallop or click PMI enlarged AICD under left clavicle  Abdomen: benighn, BS positve, no tenderness, no AAA no bruit.  No HSM or HJR Distal pulses intact with no bruits No edema Neuro non-focal Skin warm and dry No muscular weakness   Labs:   Lab Results  Component Value Date   WBC 5.1 03/21/2020   HGB 9.7 (L) 03/21/2020   HCT 30.6 (L) 03/21/2020   MCV 98.1 03/21/2020   PLT 164 03/21/2020    Recent Labs  Lab 03/21/20 0332  NA 135  K 4.4  CL 99  CO2 24  BUN 43*  CREATININE 1.74*  CALCIUM 8.8*  PROT 6.3*  BILITOT 0.7  ALKPHOS 18*  ALT 11  AST 15  GLUCOSE 155*   Lab Results  Component Value  Date   TROPONINI <0.03 05/27/2018    Lab Results  Component Value Date   CHOL 106 12/02/2019   CHOL 266 (H) 06/29/2019   CHOL 262 (H) 06/29/2019   Lab Results  Component Value Date   HDL 31 (L) 12/02/2019   HDL 51 06/29/2019   HDL 44 06/29/2019   Lab Results  Component Value Date   LDLCALC 50 12/02/2019   LDLCALC 192 (H) 06/29/2019   LDLCALC 184 (H) 06/29/2019   Lab Results  Component Value Date   TRIG 136 03/19/2020   TRIG 140 12/02/2019   TRIG 117 06/29/2019   Lab Results  Component Value Date   CHOLHDL 5.2 06/29/2019  CHOLHDL 6.0 06/29/2019   No results found for: LDLDIRECT    Radiology: DG Chest Portable 1 View  Result Date: 03/19/2020 CLINICAL DATA:  Shortness of breath EXAM: PORTABLE CHEST 1 VIEW COMPARISON:  12/14/2019 FINDINGS: Left-sided ICD device as before. Enlarged cardiomediastinal silhouette with mild central congestion. Possible tiny pleural effusions. No overt edema. No consolidation or pneumothorax. IMPRESSION: Cardiomegaly with mild central congestion and possible tiny pleural effusions. Electronically Signed   By: Jasmine Pang M.D.   On: 03/19/2020 19:30   CUP PACEART INCLINIC DEVICE CHECK  Result Date: 03/16/2020 ICD check in clinic. Normal device function. Thresholds and sensing consistent with previous device measurements. Impedance trends stable over time. 1 VT-1 Zone event logged, with max VR of 185 bpm, duration 2 minutes, possibly AF w/ RVR. Histogram distribution appropriate for patient and level of activity. RV output decreased to 2.5 V for chronic lead settings, allowing 2:1 safety margin. Device programmed at appropriate safety margins. Estimated longevity 8.8 years. Pt enrolled in remote follow-up 06/13/20. Patient education completed. ROV w/ GT/R.Sharia Reeve, BSN, RN  CUP PACEART REMOTE DEVICE CHECK  Result Date: 03/14/2020 Scheduled remote reviewed. 1 NSVT event appears a fib w/ rvr, duration 2 seconds.  Normal device function.  R.  Powers, LPN, CVRS Next remote 91 days.  ECHOCARDIOGRAM LIMITED  Result Date: 03/20/2020    ECHOCARDIOGRAM LIMITED REPORT   Patient Name:   Terry Hanson Date of Exam: 03/20/2020 Medical Rec #:  [id number]   Height:       62.0 in Accession #:    [id number]  Weight:       190.0 lb Date of Birth:  11-28-1940    BSA:          1.870 m Patient Age:    79 years    BP:           102/70 mmHg Patient Gender: F           HR:           59 bpm. [facility] Procedure: Limited Echo, Cardiac Doppler, Color Doppler and Intracardiac            Opacification Agent Indications:    CHF-Acute Systolic I50.21  History:        Patient has prior history of Echocardiogram examinations, most                 recent 09/23/2019. CHF and Cardiomyopathy, Arrythmias:STEMI; Risk                 Factors:Diabetes, Dyslipidemia and Non-Smoker.  Sonographer:    Renella Cunas RDCS Referring Phys: [id number] OLADAPO ADEFESO  Sonographer Comments: Covid positive. IMPRESSIONS  1. Akinesis of the anterior, septal and apical walls with overall severe LV dysfunction; no apical thrombus noted using definity.  2. Left ventricular ejection fraction, by estimation, is 25 to 30%. The left ventricle has severely decreased function. The left ventricle demonstrates regional wall motion abnormalities (see scoring diagram/findings for description). Left ventricular diastolic function could not be evaluated.  3. Right ventricular systolic function is normal. The right ventricular size is normal. There is normal pulmonary artery systolic pressure.  4. The mitral valve is normal in structure. Mild mitral valve regurgitation. No evidence of mitral stenosis.  5. The aortic valve has an indeterminant number of cusps. Aortic valve regurgitation is not visualized. No aortic stenosis is present.  6. The inferior vena cava is normal in size with greater than 50% respiratory variability, suggesting  right atrial pressure of 3 mmHg. FINDINGS  Left Ventricle: Left ventricular  ejection fraction, by estimation, is 25 to 30%. The left ventricle has severely decreased function. The left ventricle demonstrates regional wall motion abnormalities. Definity contrast agent was given IV to delineate the left ventricular endocardial borders. The left ventricular internal cavity size was normal in size. There is no left ventricular hypertrophy. Left ventricular diastolic function could not be evaluated. Left ventricular diastolic function could not be  evaluated due to atrial fibrillation. Right Ventricle: The right ventricular size is normal. Right ventricular systolic function is normal. There is normal pulmonary artery systolic pressure. The tricuspid regurgitant velocity is 1.47 m/s, and with an assumed right atrial pressure of 3 mmHg,  the estimated right ventricular systolic pressure is 11.6 mmHg. Left Atrium: Left atrial size was normal in size. Right Atrium: Right atrial size was normal in size. Pericardium: There is no evidence of pericardial effusion. Mitral Valve: The mitral valve is normal in structure. Mild mitral valve regurgitation. No evidence of mitral valve stenosis. Tricuspid Valve: The tricuspid valve is normal in structure. Tricuspid valve regurgitation is mild . No evidence of tricuspid stenosis. Aortic Valve: The aortic valve has an indeterminant number of cusps. Aortic valve regurgitation is not visualized. No aortic stenosis is present. Pulmonic Valve: The pulmonic valve was not well visualized. Pulmonic valve regurgitation is trivial. No evidence of pulmonic stenosis. Aorta: The aortic root is normal in size and structure. Venous: The inferior vena cava is normal in size with greater than 50% respiratory variability, suggesting right atrial pressure of 3 mmHg.  Additional Comments: Akinesis of the anterior, septal and apical walls with overall severe LV dysfunction; no apical thrombus noted using definity. A pacer wire is visualized. LEFT VENTRICLE PLAX 2D LVIDd:          4.61 cm LVIDs:         4.00 cm LV PW:         0.75 cm LV IVS:        0.69 cm LVOT diam:     2.00 cm LV SV:         47 LV SV Index:   25 LVOT Area:     3.14 cm  LV Volumes (MOD) LV vol d, MOD A2C: 177.0 ml LV vol d, MOD A4C: 142.0 ml LV vol s, MOD A2C: 104.0 ml LV vol s, MOD A4C: 99.9 ml LV SV MOD A2C:     73.0 ml LV SV MOD A4C:     142.0 ml LV SV MOD BP:      52.8 ml RIGHT VENTRICLE TAPSE (M-mode): 1.3 cm LEFT ATRIUM         Index LA diam:    3.30 cm 1.76 cm/m  AORTIC VALVE LVOT Vmax:   80.70 cm/s LVOT Vmean:  49.700 cm/s LVOT VTI:    0.151 m  AORTA Ao Root diam: 3.10 cm TRICUSPID VALVE TR Peak grad:   8.6 mmHg TR Vmax:        147.00 cm/s  SHUNTS Systemic VTI:  0.15 m Systemic Diam: 2.00 cm Olga Millers MD Electronically signed by Olga Millers MD Signature Date/Time: 03/20/2020/9:08:29 AM    Final     EKG: afib rate 90 low voltage    ASSESSMENT AND PLAN:   1. Ischemic DCM:  Post anterior MI 06/29/19 with EF 30-35% no improvement July with AICD Normal function device on appropriate medical RX Ran out of lasix prior to admission. Prior to admission was on  eliquis, laisx 20 mg Toprol 100 mg and entresto 24 / 26 mg as well as aldactone 25 mg with digoxin 0.125 mg  Not given any of these since admission !! Will resume lasix and toprol for starters and entresto in am if BP systolic over 100 mmHg  2. AFib:  Resume eliquis see above   3. COVID has been vaccinated on steroids and remddesivir   Signed: Charlton [facility] 03/21/2020, 9:06 AM

## 2020-03-21 NOTE — Progress Notes (Signed)
Telemetry tech called to notify me of patient's HR in the 150's, noted to spike up to 170's. Went in patient's room, patient sitting up on side of bed eating breakfast. VS checked again, B/P 116/54 Radial pulse 95 98% on room air. Patient stated "it's happening because of that breathing treatment I just did." Cardiologist just put in orders for metoprolol and entresto.

## 2020-03-24 LAB — CULTURE, BLOOD (ROUTINE X 2)
Culture: NO GROWTH
Special Requests: ADEQUATE

## 2020-03-25 LAB — CULTURE, BLOOD (ROUTINE X 2): Culture: NO GROWTH

## 2020-03-28 NOTE — Progress Notes (Signed)
Remote ICD transmission.   

## 2020-04-28 DIAGNOSIS — H25813 Combined forms of age-related cataract, bilateral: Secondary | ICD-10-CM | POA: Diagnosis not present

## 2020-04-28 DIAGNOSIS — H01002 Unspecified blepharitis right lower eyelid: Secondary | ICD-10-CM | POA: Diagnosis not present

## 2020-04-28 DIAGNOSIS — H01004 Unspecified blepharitis left upper eyelid: Secondary | ICD-10-CM | POA: Diagnosis not present

## 2020-04-28 DIAGNOSIS — H01001 Unspecified blepharitis right upper eyelid: Secondary | ICD-10-CM | POA: Diagnosis not present

## 2020-06-20 ENCOUNTER — Inpatient Hospital Stay (HOSPITAL_COMMUNITY): Admission: RE | Admit: 2020-06-20 | Payer: Medicare Other | Source: Ambulatory Visit

## 2020-06-20 ENCOUNTER — Encounter (HOSPITAL_COMMUNITY)
Admission: RE | Admit: 2020-06-20 | Discharge: 2020-06-20 | Disposition: A | Discharge status: Home / Self Care | Payer: Medicare Other | Source: Ambulatory Visit | Attending: Ophthalmology | Admitting: Ophthalmology

## 2020-06-20 ENCOUNTER — Other Ambulatory Visit: Payer: Self-pay

## 2020-06-20 ENCOUNTER — Ambulatory Visit (INDEPENDENT_AMBULATORY_CARE_PROVIDER_SITE_OTHER): Payer: Medicare Other

## 2020-06-20 DIAGNOSIS — H25811 Combined forms of age-related cataract, right eye: Secondary | ICD-10-CM | POA: Diagnosis not present

## 2020-06-20 DIAGNOSIS — I5023 Acute on chronic systolic (congestive) heart failure: Secondary | ICD-10-CM | POA: Diagnosis not present

## 2020-06-20 NOTE — Progress Notes (Signed)
Spoke with patient, she said her Dr.'s office told her to come here as soon as she could, so she did. I apologized to patient for the inconvenience. Then reminded her of her appointment time Friday 06/24/20 at 1:15. Patient wrote it down and said she would be here.

## 2020-06-20 NOTE — Progress Notes (Addendum)
Patient came in for her Covid test today. It's not scheduled till 4/8 and her surgery is 4/11. Pt. Went to registration and was arrived at 9:28. I didn't see her 4/8 appointment until patient had come and gone. I swabbed her,created a Covid order, printed requisition, released order, and wrote this note. Patient may have to repeat test if Dr.  Jerl Mina want test completed until 4/8. I'll create an appt. For today so registration can arrive patient for Covid test. (Pt. Was arrived at 9:28 with what they thought was a PAT visit instead of a ph. Call.. That's the only reason I swabbed the patient.) Per Britta Mccreedy and this note patient will have to come on 4/8 for Covid test. (It's too early.) Specimen had not yet gone to lab, threw it away. (per Britta Mccreedy) Cancelled today's appt. For Covid test. I'll call patient this afternoon and let her know she'll have to come in 4/8.

## 2020-06-21 NOTE — H&P (Signed)
Surgical History & Physical  Patient Name: Terry Hanson DOB: September 20, 1940  Surgery: Cataract extraction with intraocular lens implant phacoemulsification; Right Eye  Surgeon: Fabio Pierce MD Surgery Date:  06/27/2020 Pre-Op Date:  06/21/2020  HPI: A 75 Yr. old female patient 1. 1. The patient complains of nighttime light - car headlights, street lamps etc. glare causing poor vision, which began many years ago. Both eyes are affected. The episode is gradual. The condition's severity increased since last visit. Symptoms occur when the patient is driving and outside. This is negatively affecting the patient's quality of life. HPI was performed by Fabio Pierce .  Medical History: Cataracts Heart Problem High Blood Pressure LDL  Review of Systems Negative Allergic/Immunologic Negative Cardiovascular Negative Constitutional Negative Ear, Nose, Mouth & Throat Negative Endocrine Negative Eyes Negative Gastrointestinal Negative Genitourinary Negative Hemotologic/Lymphatic Negative Integumentary Negative Musculoskeletal Negative Neurological Negative Psychiatry Negative Respiratory  Social   Never Smoked  Medication Ascorbic acid, Dexamethasone, Guaifenesin-dextromethorphan, Nystatin, Zinc sulfate, Metoprolol, Furosemide, Clopidogrel, Digoxin, Eliquis, Entresto, Levalbuterol Inhaler, Levothyroxine Sodium, Metoprolol, Nitroglycerin, Potassium chloride, Rosuvastatin, Spironolactone, Spironolactone, Vitamin B-12, Zinc,   Sx/Procedures Defibrilator implant,   Drug Allergies  Codine, Penicillins,   History & Physical: Heent:  Cataract, Right eye NECK: supple without bruits LUNGS: lungs clear to auscultation CV: regular rate and rhythm Abdomen: soft and non-tender   Impression & Plan: Assessment: 1.  COMBINED FORMS AGE RELATED CATARACT; Both Eyes (H25.813) 2.  BLEPHARITIS; Right Upper Lid, Right Lower Lid, Left Upper Lid, Left Lower Lid (H01.001, H01.002,H01.004,H01.005) 3.   DERMATOCHALASIS; Right Upper Lid, Left Upper Lid (H02.831, O97.353)  Plan: 1.  Cataract accounts for the patient's decreased vision. This visual impairment is not correctable with a tolerable change in glasses or contact lenses. Cataract surgery with an implantation of a new lens should significantly improve the visual and functional status of the patient. Discussed all risks, benefits, alternatives, and potential complications. Discussed the procedures and recovery. Patient desires to have surgery. A-scan ordered and performed today for intra-ocular lens calculations. The surgery will be performed in order to improve vision for driving, reading, and for eye examinations. Recommend phacoemulsification with intra-ocular lens. Recommend Dextenza for post-operative pain and inflammation. Right Eye worse - first. Dilates well - shugarcaine by protocol. 2.  Regular lid cleaning. 3.  Asymptomatic, recommend observation for now. Findings, prognosis and treatment options reviewed.

## 2020-06-22 LAB — CUP PACEART REMOTE DEVICE CHECK
Battery Remaining Longevity: 114 mo
Battery Remaining Percentage: 94 %
Battery Voltage: 3.02 V
Brady Statistic RV Percent Paced: 1 %
Date Time Interrogation Session: 20220403125135
HighPow Impedance: 68 Ohm
Implantable Lead Implant Date: 20210927
Implantable Lead Location: 753860
Implantable Pulse Generator Implant Date: 20210927
Lead Channel Impedance Value: 490 Ohm
Lead Channel Pacing Threshold Amplitude: 0.75 V
Lead Channel Pacing Threshold Pulse Width: 0.5 ms
Lead Channel Sensing Intrinsic Amplitude: 11.6 mV
Lead Channel Setting Pacing Amplitude: 2.5 V
Lead Channel Setting Pacing Pulse Width: 0.5 ms
Lead Channel Setting Sensing Sensitivity: 0.5 mV
Pulse Gen Serial Number: 810006603

## 2020-06-24 ENCOUNTER — Encounter (HOSPITAL_COMMUNITY): Payer: Self-pay

## 2020-06-24 ENCOUNTER — Other Ambulatory Visit (HOSPITAL_COMMUNITY)
Admission: RE | Admit: 2020-06-24 | Discharge: 2020-06-24 | Disposition: A | Discharge status: Home / Self Care | Payer: Medicare Other | Source: Ambulatory Visit | Attending: Ophthalmology | Admitting: Ophthalmology

## 2020-06-24 ENCOUNTER — Other Ambulatory Visit: Payer: Self-pay

## 2020-06-24 DIAGNOSIS — Z20822 Contact with and (suspected) exposure to covid-19: Secondary | ICD-10-CM | POA: Insufficient documentation

## 2020-06-24 DIAGNOSIS — Z01812 Encounter for preprocedural laboratory examination: Secondary | ICD-10-CM | POA: Diagnosis not present

## 2020-06-24 MED ORDER — CYCLOPENTOLATE-PHENYLEPHRINE 0.2-1 % OP SOLN: 1.0000 [drp] | OPHTHALMIC | Status: DC | PRN

## 2020-06-24 NOTE — Patient Instructions (Signed)
    Terry Hanson  06/24/2020     @PREFPERIOPPHARMACY @   Your procedure is scheduled on  06/27/2020    Report to [facility] at  [facility]  A.M.   Call this number if you have problems the morning of surgery:  [phone]   Remember:  Do not eat or drink after midnight.                      Take these medicines the morning of surgery with A SIP OF WATER  Digoxin, levothyroxine, metoprolol.  Use your inhaler before you come.  DO NOT take any medications for diabetes the morning of your procedure.  If your glucose is 70 or below the morning of your procedure, drink 1/2 cup of clear juice and recheck your glucose in 15 minutes. If your glucose is still 70 or below, call [phone] for instructions.   If your glucose is 300 or above the morning of your procedure, call [phone] for instructions.     Please brush your teeth  Do not wear jewelry, make-up or nail polish.  Do not wear lotions, powders, or perfumes, or deodorant.  Do not shave 48 hours prior to surgery.  Men may shave face and neck.  Do not bring valuables to the hospital.  [facility] is not responsible for any belongings or valuables.  Contacts, dentures or bridgework may not be worn into surgery.  Leave your suitcase in the car.  After surgery it may be brought to your room.  For patients admitted to the hospital, discharge time will be determined by your treatment team.  Patients discharged the day of surgery will not be allowed to drive home and must have someone with them for 24 hours.   Special instructions:  DO NOT smoke tobacco or vape for 24 hours before your procedure.   Please read over the following fact sheets that you were given. Anesthesia Post-op Instructions and Care and Recovery After Surgery

## 2020-06-25 LAB — SARS CORONAVIRUS 2 (TAT 6-24 HRS): SARS Coronavirus 2: NEGATIVE

## 2020-06-27 ENCOUNTER — Other Ambulatory Visit: Payer: Self-pay

## 2020-06-27 ENCOUNTER — Ambulatory Visit (HOSPITAL_COMMUNITY): Payer: Medicare Other | Admitting: Anesthesiology

## 2020-06-27 ENCOUNTER — Ambulatory Visit (HOSPITAL_COMMUNITY)
Admission: RE | Admit: 2020-06-27 | Discharge: 2020-06-27 | Disposition: A | Discharge status: Home / Self Care | Payer: Medicare Other | Attending: Ophthalmology | Admitting: Ophthalmology

## 2020-06-27 ENCOUNTER — Encounter (HOSPITAL_COMMUNITY)
Admission: RE | Disposition: A | Discharge status: Home / Self Care | Payer: Self-pay | Source: Home / Self Care | Attending: Ophthalmology

## 2020-06-27 ENCOUNTER — Encounter (HOSPITAL_COMMUNITY): Payer: Self-pay | Admitting: Ophthalmology

## 2020-06-27 DIAGNOSIS — E1136 Type 2 diabetes mellitus with diabetic cataract: Secondary | ICD-10-CM | POA: Diagnosis not present

## 2020-06-27 DIAGNOSIS — H5711 Ocular pain, right eye: Secondary | ICD-10-CM | POA: Insufficient documentation

## 2020-06-27 DIAGNOSIS — Z7901 Long term (current) use of anticoagulants: Secondary | ICD-10-CM | POA: Diagnosis not present

## 2020-06-27 DIAGNOSIS — Z880 Allergy status to penicillin: Secondary | ICD-10-CM | POA: Insufficient documentation

## 2020-06-27 DIAGNOSIS — Z79899 Other long term (current) drug therapy: Secondary | ICD-10-CM | POA: Diagnosis not present

## 2020-06-27 DIAGNOSIS — H25811 Combined forms of age-related cataract, right eye: Secondary | ICD-10-CM | POA: Insufficient documentation

## 2020-06-27 DIAGNOSIS — Z79890 Hormone replacement therapy (postmenopausal): Secondary | ICD-10-CM | POA: Diagnosis not present

## 2020-06-27 DIAGNOSIS — Z885 Allergy status to narcotic agent status: Secondary | ICD-10-CM | POA: Insufficient documentation

## 2020-06-27 HISTORY — PX: CATARACT EXTRACTION W/PHACO: SHX586

## 2020-06-27 LAB — GLUCOSE, CAPILLARY: Glucose-Capillary: 158 mg/dL — ABNORMAL HIGH (ref 70–99)

## 2020-06-27 SURGERY — PHACOEMULSIFICATION, CATARACT, WITH IOL INSERTION
Anesthesia: Monitor Anesthesia Care | Site: Eye | Laterality: Right

## 2020-06-27 MED ORDER — TETRACAINE HCL 0.5 % OP SOLN
1.0000 [drp] | OPHTHALMIC | Status: AC | PRN
  Administered 2020-06-27 (×3): 1 [drp] via OPHTHALMIC

## 2020-06-27 MED ORDER — MIDAZOLAM HCL 2 MG/2ML IJ SOLN
INTRAMUSCULAR | Status: DC | PRN
  Administered 2020-06-27: 1 mg via INTRAVENOUS

## 2020-06-27 MED ORDER — SODIUM CHLORIDE 0.9 % IV BOLUS
250.0000 mL | Freq: Once | INTRAVENOUS | Status: AC
  Administered 2020-06-27: 250 mL via INTRAVENOUS

## 2020-06-27 MED ORDER — EPINEPHRINE PF 1 MG/ML IJ SOLN
INTRAMUSCULAR | Status: AC
  Filled 2020-06-27: qty 2

## 2020-06-27 MED ORDER — STERILE WATER FOR IRRIGATION IR SOLN
Status: DC | PRN
  Administered 2020-06-27: 250 mL

## 2020-06-27 MED ORDER — LIDOCAINE HCL (PF) 1 % IJ SOLN
INTRAOCULAR | Status: DC | PRN
  Administered 2020-06-27: 1 mL via OPHTHALMIC

## 2020-06-27 MED ORDER — DIGOXIN 125 MCG PO TABS: 0.1250 mg | ORAL_TABLET | ORAL | 2 refills | Status: DC

## 2020-06-27 MED ORDER — TROPICAMIDE 1 % OP SOLN
1.0000 [drp] | OPHTHALMIC | Status: AC
  Administered 2020-06-27 (×3): 1 [drp] via OPHTHALMIC

## 2020-06-27 MED ORDER — EPINEPHRINE PF 1 MG/ML IJ SOLN
INTRAOCULAR | Status: DC | PRN
  Administered 2020-06-27: 500 mL

## 2020-06-27 MED ORDER — BSS IO SOLN
INTRAOCULAR | Status: DC | PRN
  Administered 2020-06-27: 15 mL

## 2020-06-27 MED ORDER — DEXAMETHASONE 0.4 MG OP INST
VAGINAL_INSERT | OPHTHALMIC | Status: AC
  Filled 2020-06-27: qty 1

## 2020-06-27 MED ORDER — SODIUM CHLORIDE 0.9 % IV SOLN: INTRAVENOUS | Status: DC | PRN

## 2020-06-27 MED ORDER — LIDOCAINE HCL 3.5 % OP GEL
1.0000 "application " | Freq: Once | OPHTHALMIC | Status: AC
  Administered 2020-06-27: 1 via OPHTHALMIC

## 2020-06-27 MED ORDER — MIDAZOLAM HCL 2 MG/2ML IJ SOLN
INTRAMUSCULAR | Status: AC
  Filled 2020-06-27: qty 2

## 2020-06-27 MED ORDER — SODIUM HYALURONATE 23 MG/ML IO SOLN
INTRAOCULAR | Status: DC | PRN
  Administered 2020-06-27: 0.6 mL via INTRAOCULAR

## 2020-06-27 MED ORDER — DEXAMETHASONE 0.4 MG OP INST
VAGINAL_INSERT | OPHTHALMIC | Status: DC | PRN
  Administered 2020-06-27: 0.4 mg via OPHTHALMIC

## 2020-06-27 MED ORDER — PROVISC 10 MG/ML IO SOLN
INTRAOCULAR | Status: DC | PRN
  Administered 2020-06-27: 0.85 mL via INTRAOCULAR

## 2020-06-27 MED ORDER — POVIDONE-IODINE 5 % OP SOLN
OPHTHALMIC | Status: DC | PRN
  Administered 2020-06-27: 1 via OPHTHALMIC

## 2020-06-27 MED ORDER — SODIUM CHLORIDE 0.9% FLUSH: INTRAVENOUS | Status: DC | PRN

## 2020-06-27 MED ORDER — PHENYLEPHRINE HCL 2.5 % OP SOLN
1.0000 [drp] | OPHTHALMIC | Status: AC | PRN
  Administered 2020-06-27 (×3): 1 [drp] via OPHTHALMIC

## 2020-06-27 SURGICAL SUPPLY — 14 items
CLOTH BEACON ORANGE TIMEOUT ST (SAFETY) ×1 IMPLANT
EYE SHIELD UNIVERSAL CLEAR (GAUZE/BANDAGES/DRESSINGS) ×1 IMPLANT
GLOVE SURG UNDER POLY LF SZ6.5 (GLOVE) ×2 IMPLANT
GLOVE SURG UNDER POLY LF SZ7 (GLOVE) ×1 IMPLANT
GOWN STRL REUS W/TWL LRG LVL3 (GOWN DISPOSABLE) ×1 IMPLANT
NDL HYPO 18GX1.5 BLUNT FILL (NEEDLE) IMPLANT
NEEDLE HYPO 18GX1.5 BLUNT FILL (NEEDLE) ×2 IMPLANT
PAD ARMBOARD 7.5X6 YLW CONV (MISCELLANEOUS) ×1 IMPLANT
SYR TB 1ML LL NO SAFETY (SYRINGE) ×1 IMPLANT
TAPE SURG TRANSPORE 1 IN (GAUZE/BANDAGES/DRESSINGS) IMPLANT
TAPE SURGICAL TRANSPORE 1 IN (GAUZE/BANDAGES/DRESSINGS) ×2
TECNIS 1 PIECE IOL (Intraocular Lens) ×1 IMPLANT
VISCOELASTIC ADDITIONAL (OPHTHALMIC RELATED) IMPLANT
WATER STERILE IRR 250ML POUR (IV SOLUTION) ×1 IMPLANT

## 2020-06-27 NOTE — Anesthesia Preprocedure Evaluation (Addendum)
Anesthesia Evaluation  Patient identified by MRN, date of birth, ID band Patient awake    Reviewed: Allergy & Precautions, NPO status , Patient's Chart, lab work & pertinent test results  Airway Mallampati: II  TM Distance: >3 FB Neck ROM: Full    Dental  (+) Upper Dentures, Lower Dentures   Pulmonary shortness of breath and with exertion, asthma ,    Pulmonary exam normal breath sounds clear to auscultation       Cardiovascular Exercise Tolerance: Poor + CAD, + Past MI, +CHF (EF 25 to 30%) and + DOE  Normal cardiovascular exam+ Cardiac Defibrillator  Rhythm:Regular Rate:Normal  1. Akinesis of the anterior, septal and apical walls with overall severe  LV dysfunction; no apical thrombus noted using definity.  2. Left ventricular ejection fraction, by estimation, is 25 to 30%. The  left ventricle has severely decreased function. The left ventricle  demonstrates regional wall motion abnormalities (see scoring  diagram/findings for description). Left ventricular  diastolic function could not be evaluated.  3. Right ventricular systolic function is normal. The right ventricular  size is normal. There is normal pulmonary artery systolic pressure.  4. The mitral valve is normal in structure. Mild mitral valve  regurgitation. No evidence of mitral stenosis.  5. The aortic valve has an indeterminant number of cusps. Aortic valve  regurgitation is not visualized. No aortic stenosis is present.  6. The inferior vena cava is normal in size with greater than 50%  respiratory variability, suggesting right atrial pressure of 3 mmHg.    Neuro/Psych negative neurological ROS     GI/Hepatic negative GI ROS, Neg liver ROS,   Endo/Other  diabetes, Well Controlled, Type 2Hypothyroidism   Renal/GU Renal InsufficiencyRenal disease     Musculoskeletal  (+) Arthritis ,   Abdominal   Peds  Hematology   Anesthesia Other Findings    Reproductive/Obstetrics                            Anesthesia Physical Anesthesia Plan  ASA: IV  Anesthesia Plan: MAC   Post-op Pain Management:    Induction:   PONV Risk Score and Plan:   Airway Management Planned: Nasal Cannula and Natural Airway  Additional Equipment:   Intra-op Plan:   Post-operative Plan:   Informed Consent: I have reviewed the patients History and Physical, chart, labs and discussed the procedure including the risks, benefits and alternatives for the proposed anesthesia with the patient or authorized representative who has indicated his/her understanding and acceptance.     Dental advisory given  Plan Discussed with: CRNA and Surgeon  Anesthesia Plan Comments:         Anesthesia Quick Evaluation

## 2020-06-27 NOTE — Discharge Instructions (Addendum)
Please discharge patient when stable, will follow up today with Dr. Wrzosek at the [facility] [place] office immediately following discharge.  Leave shield in place until visit.  All paperwork with discharge instructions will be given at the office.   [facility] Address:  [address]  , [place] [address]   PATIENT INSTRUCTIONS POST-ANESTHESIA  IMMEDIATELY FOLLOWING SURGERY:  Do not drive or operate machinery for the first twenty four hours after surgery.  Do not make any important decisions for twenty four hours after surgery or while taking narcotic pain medications or sedatives.  If you develop intractable nausea and vomiting or a severe headache please notify your doctor immediately.  FOLLOW-UP:  Please make an appointment with your surgeon as instructed. You do not need to follow up with anesthesia unless specifically instructed to do so.  WOUND CARE INSTRUCTIONS (if applicable):  Keep a dry clean dressing on the anesthesia/puncture wound site if there is drainage.  Once the wound has quit draining you may leave it open to air.  Generally you should leave the bandage intact for twenty four hours unless there is drainage.  If the epidural site drains for more than 36-48 hours please call the anesthesia department.  QUESTIONS?:  Please feel free to call your physician or the hospital operator if you have any questions, and they will be happy to assist you.       

## 2020-06-27 NOTE — Anesthesia Postprocedure Evaluation (Signed)
Anesthesia Post Note  Patient: Terry Hanson  Procedure(s) Performed: CATARACT EXTRACTION PHACO AND INTRAOCULAR LENS PLACEMENT RIGHT EYE AND PLACEMENT OF CORTICOSTEROID (Right Eye)  Patient location during evaluation: Phase II Anesthesia Type: MAC Level of consciousness: awake and alert and oriented Pain management: pain level controlled Vital Signs Assessment: post-procedure vital signs reviewed and stable Respiratory status: spontaneous breathing and respiratory function stable Cardiovascular status: blood pressure returned to baseline and stable Postop Assessment: no apparent nausea or vomiting Anesthetic complications: no   No complications documented.   Last Vitals:  Vitals:   06/27/20 0821 06/27/20 0931  BP: (!) 115/55   Pulse:  66  Resp: (!) 75 16  Temp: (!) 36.4 C 36.6 C  SpO2: 97% 100%    Last Pain:  Vitals:   06/27/20 0931  TempSrc: Oral  PainSc: 0-No pain                 Agapita Savarino C Karelyn Brisby

## 2020-06-27 NOTE — Interval H&P Note (Signed)
History and Physical Interval Note:  06/27/2020 8:56 AM  Terry Hanson  has presented today for surgery, with the diagnosis of Nuclear sclerotic cataract - Right eye.  The various methods of treatment have been discussed with the patient and family. After consideration of risks, benefits and other options for treatment, the patient has consented to  Procedure(s) with comments: CATARACT EXTRACTION PHACO AND INTRAOCULAR LENS PLACEMENT (IOC) (Right) - right as a surgical intervention.  The patient's history has been reviewed, patient examined, no change in status, stable for surgery.  I have reviewed the patient's chart and labs.  Questions were answered to the patient's satisfaction.     Fabio Pierce

## 2020-06-27 NOTE — Anesthesia Procedure Notes (Signed)
Procedure Name: MAC Date/Time: 06/27/2020 9:06 AM Performed by: Orlie Dakin, CRNA Pre-anesthesia Checklist: Patient identified, Emergency Drugs available, Suction available and Patient being monitored Patient Re-evaluated:Patient Re-evaluated prior to induction Oxygen Delivery Method: Nasal cannula Placement Confirmation: positive ETCO2

## 2020-06-27 NOTE — Op Note (Signed)
Date of procedure: 06/27/20  Pre-operative diagnosis:  Visually significant combined form age-related cataract, Right Eye (H25.811)  Post-operative diagnosis:   1. Visually significant combined form age-related cataract, Right Eye (H25.811) 2. Pain and inflammation following cataract surgery Right Eye (H57.11)  Procedure:  1. Removal of cataract via phacoemulsification and insertion of intra-ocular lens [employer]  +23.0D into the capsular bag of the Right Eye 2. Placement of Dextenza insert, Right Eye  Attending surgeon: Gerda Diss. Zehra Rucci, MD, MA  Anesthesia: MAC, Topical Akten  Complications: None  Estimated Blood Loss: <39m (minimal)  Specimens: None  Implants: As above  Indications:  Visually significant age-related cataract, Right Eye  Procedure:  The patient was seen and identified in the pre-operative area. The operative eye was identified and dilated.  The operative eye was marked.  Topical anesthesia was administered to the operative eye.     The patient was then to the operative suite and placed in the supine position.  A timeout was performed confirming the patient, procedure to be performed, and all other relevant information.   The patient's face was prepped and draped in the usual fashion for intra-ocular surgery.  A lid speculum was placed into the operative eye and the surgical microscope moved into place and focused.  A superotemporal paracentesis was created using a 20 gauge paracentesis blade.  Shugarcaine was injected into the anterior chamber.  Viscoelastic was injected into the anterior chamber.  A temporal clear-corneal main wound incision was created using a 2.448mmicrokeratome.  A continuous curvilinear capsulorrhexis was initiated using an irrigating cystitome and completed using capsulorrhexis forceps.  Hydrodissection and hydrodeliniation were performed.  Viscoelastic was injected into the anterior chamber.  A phacoemulsification handpiece  and a chopper as a second instrument were used to remove the nucleus and epinucleus. The irrigation/aspiration handpiece was used to remove any remaining cortical material.   The capsular bag was reinflated with viscoelastic, checked, and found to be intact.  The intraocular lens was inserted into the capsular bag.  The irrigation/aspiration handpiece was used to remove any remaining viscoelastic.  The clear corneal wound and paracentesis wounds were then hydrated and checked with Weck-Cels to be watertight.  The lid-speculum was removed.   A Dextenza implant was placed in the lower canaliculus.   The drape was removed.  The patient's face was cleaned with a wet and dry 4x4. A clear shield was taped over the eye. The patient was taken to the post-operative care unit in good condition, having tolerated the procedure well.  Post-Op Instructions: [facility] and will receive all other orders and instructions.

## 2020-06-27 NOTE — Transfer of Care (Signed)
Immediate Anesthesia Transfer of Care Note  Patient: Terry Hanson  Procedure(s) Performed: CATARACT EXTRACTION PHACO AND INTRAOCULAR LENS PLACEMENT RIGHT EYE AND PLACEMENT OF CORTICOSTEROID (Right Eye)  Patient Location: Short Stay  Anesthesia Type:MAC  Level of Consciousness: awake, alert  and oriented  Airway & Oxygen Therapy: Patient Spontanous Breathing  Post-op Assessment: Report given to RN, Post -op Vital signs reviewed and stable and Patient moving all extremities X 4  Post vital signs: Reviewed and stable  Last Vitals:  Vitals Value Taken Time  BP    Temp    Pulse    Resp    SpO2      Last Pain:  Vitals:   06/27/20 0821  TempSrc: Oral  PainSc: 0-No pain      Patients Stated Pain Goal: 5 (07/86/75 4492)  Complications: No complications documented.

## 2020-06-28 ENCOUNTER — Encounter (HOSPITAL_COMMUNITY): Payer: Self-pay | Admitting: Ophthalmology

## 2020-06-29 NOTE — Progress Notes (Signed)
Remote ICD transmission.   

## 2020-06-30 NOTE — Patient Instructions (Signed)
Terry Hanson  06/30/2020     @PREFPERIOPPHARMACY @   Your procedure is scheduled on  07/11/2020.   Report to 07/13/2020 at  1230  PM.   Call this number if you have problems the morning of surgery:  [phone]   Remember:  Do not eat or drink after midnight.                       Take these medicines the morning of surgery with A SIP OF WATER  Digoxin, levothyroine, metoprolol, entresto.   DO NOT take any medications for diabetes the morning of your procedure.     Please brush your teeth.  Do not wear jewelry, make-up or nail polish.  Do not wear lotions, powders, or perfumes, or deodorant.  Do not shave 48 hours prior to surgery.  Men may shave face and neck.  Do not bring valuables to the hospital.  [facility] is not responsible for any belongings or valuables.  Contacts, dentures or bridgework may not be worn into surgery.  Leave your suitcase in the car.  After surgery it may be brought to your room.  For patients admitted to the hospital, discharge time will be determined by your treatment team.  Patients discharged the day of surgery will not be allowed to drive home and must have someone with them for 24 hours.     Special instructions:  DO NOT smoke tobacco or vape for 24 hours before your procedure.   Please read over the following fact sheets that you were given. Anesthesia Post-op Instructions and Care and Recovery After Surgery      Cataract Surgery, Care After This sheet gives you information about how to care for yourself after your procedure. Your health care provider may also give you more specific instructions. If you have problems or questions, contact your health care provider. What can I expect after the procedure? After the procedure, it is common to have:  Itching.  Discomfort.  Fluid discharge.  Sensitivity to light and to touch.  Bruising in or around the eye.  Mild blurred vision. Follow these instructions at home: Eye  care  Do not touch or rub your eyes.  Protect your eyes as told by your health care provider. You may be told to wear a protective eye shield or sunglasses.  Do not put a contact lens into the affected eye or eyes until your health care provider approves.  Keep the area around your eye clean and dry: ? Avoid swimming. ? Do not allow water to hit you directly in the face while showering. ? Keep soap and shampoo out of your eyes.  Check your eye every day for signs of infection. Watch for: ? Redness, swelling, or pain. ? Fluid, blood, or pus. ? Warmth. ? A bad smell. ? Vision that is getting worse. ? Sensitivity that is getting worse.   Activity  Do not drive for 24 hours if you were given a sedative during your procedure.  Avoid strenuous activities, such as playing contact sports, for as long as told by your health care provider.  Do not drive or use heavy machinery until your health care provider approves.  Do not bend or lift heavy objects. Bending increases pressure in the eye. You can walk, climb stairs, and do light household chores.  Ask your health care provider when you can return to work. If you work in a dusty environment, you may be  advised to wear protective eyewear for a period of time. General instructions  Take or apply over-the-counter and prescription medicines only as told by your health care provider. This includes eye drops.  Keep all follow-up visits as told by your health care provider. This is important. Contact a health care provider if:  You have increased bruising around your eye.  You have pain that is not helped with medicine.  You have a fever.  You have redness, swelling, or pain in your eye.  You have fluid, blood, or pus coming from your incision.  Your vision gets worse.  Your sensitivity to light gets worse. Get help right away if:  You have sudden loss of vision.  You see flashes of light or spots (floaters).  You have severe  eye pain.  You develop nausea or vomiting. Summary  After your procedure, it is common to have itching, discomfort, bruising, fluid discharge, or sensitivity to light.  Follow instructions from your health care provider about caring for your eye after the procedure.  Do not rub your eye after the procedure. You may need to wear eye protection or sunglasses. Do not wear contact lenses. Keep the area around your eye clean and dry.  Avoid activities that require a lot of effort. These include playing sports and lifting heavy objects.  Contact a health care provider if you have increased bruising, pain that does not go away, or a fever. Get help right away if you suddenly lose your vision, see flashes of light or spots, or have severe pain in the eye. This information is not intended to replace advice given to you by your health care provider. Make sure you discuss any questions you have with your health care provider. Document Revised: 12/30/2018 Document Reviewed: 09/02/2017 Elsevier Patient Education  2021 [employer].

## 2020-07-04 DIAGNOSIS — H25812 Combined forms of age-related cataract, left eye: Secondary | ICD-10-CM | POA: Diagnosis not present

## 2020-07-05 NOTE — H&P (Signed)
Surgical History & Physical  Patient Name: Terry Hanson DOB: October 09, 1940  Surgery: Cataract extraction with intraocular lens implant phacoemulsification; Left Eye  Surgeon: Fabio Pierce MD Surgery Date:  07/11/2020 Pre-Op Date:  07/04/2020  HPI: A 13 Yr. old female patient PO-OD/Pre-Op OS The patient is returning after cataract surgery (w/dextenza). The right eye is affected. Status post cataract surgery, which began 1 week ago: Since the last visit, the affected area is doing well. The patient's vision is improved and stable. Patient is following medication instructions. Combo drops TID OD. Pt denies any floaters. The patient complains of nighttime light - car headlights, street lamps etc. glare causing poor vision, which began many years ago. The left eye is affected. The episode is gradual. The condition's severity increased since last visit. Symptoms occur when the patient is driving and outside. This is negatively affecting the patient's quality of life. HPI was performed by Fabio Pierce .  Medical History: Cataracts Heart Problem High Blood Pressure LDL  Review of Systems Negative Allergic/Immunologic Negative Cardiovascular Negative Constitutional Negative Ear, Nose, Mouth & Throat Negative Endocrine Negative Gastrointestinal Negative Genitourinary Negative Hemotologic/Lymphatic Negative Integumentary Negative Musculoskeletal Negative Neurological Negative Psychiatry Negative Respiratory  Social   Never smoked  Medication Prednisolone-gatiflox-bromfenac,  Ascorbic acid, Dexamethasone, Guaifenesin-dextromethorphan, Nystatin, Zinc sulfate, Metoprolol, Furosemide, Clopidogrel, Digoxin, Eliquis, Entresto, Levalbuterol Inhaler, Levothyroxine Sodium, Metoprolol, Nitroglycerin, Potassium chloride, Rosuvastatin, Spironolactone, Spironolactone, Vitamin B-12, Zinc,   Sx/Procedures Phaco c IOL with Dextenza,  Defibrilator implant,   Drug Allergies  Codine, Penicillins,    History & Physical: Heent:  Cataract, Left eye NECK: supple without bruits LUNGS: lungs clear to auscultation CV: regular rate and rhythm Abdomen: soft and non-tender  Impression & Plan: Assessment: 1.  CATARACT EXTRACTION STATUS; Right Eye (Z98.41) 2.  INTRAOCULAR LENS IOL (Z96.1) 3.  COMBINED FORMS AGE RELATED CATARACT; Left Eye (H25.812)  Plan: 1.  1 week after cataract surgery. Doing well with improved vision and normal eye pressure. Call with any problems or concerns. Stop all drops. Call with any concerning symptoms. - Dextenza. 2.  Doing well since surgery Continue Post-op medications 3.  Dilates well - shugarcaine by protocol. Cataract accounts for the patient's decreased vision. This visual impairment is not correctable with a tolerable change in glasses or contact lenses. Cataract surgery with an implantation of a new lens should significantly improve the visual and functional status of the patient. Discussed all risks, benefits, alternatives, and potential complications. Discussed the procedures and recovery. Patient desires to have surgery. A-scan ordered and performed today for intra-ocular lens calculations. The surgery will be performed in order to improve vision for driving, reading, and for eye examinations. Recommend phacoemulsification with intra-ocular lens. Recommend Dextenza for post-operative pain and inflammation. Left Eye. Surgery required to correct imbalance of vision.

## 2020-07-05 NOTE — Progress Notes (Signed)
Cardiology Office Note:    Date:  07/06/2020   ID:  Terry Terry Hanson Due, DOB 04/19/1940, MRN [id number]  PCP:  Oval Linseyondiego, Richard, MD  Cardiologist:  Lesleigh NoeHenry Terry Hanson Muzamil Harker III, MD   Referring MD: Oval Linseyondiego, Richard, MD   Chief Complaint  Patient presents with  . Congestive Heart Failure    History of Present Illness:    Terry Terry Hanson Force is a 80 y.o. female with a hx of anterior myocardial infarction April 2021 with PCI of the proximal LAD- on Plavix solely, ischemic cardiomyopathy EF 25% 03/2020 (discharged with LifeVest), chronic stage III kidney disease, diabetes mellitusII, hyperlipidemia,recently placed on amiodarone and apixaban for atrial fibrillationand ventricular tachycardia noted on LifeVest admitted5/8310from the officewith progressive weakness, nausea/vomiting and diarrheathat resolved after amiodarone stopped. COVID - 19 with respiratory involvement 03/19/2020.   Unclear who is responsible for her. [place] [facility] [facility] [place] [place] consult from Dr. Eden EmmsNishan documenting ? Terry Hanson as primary cardiologist.  The patient now tells me that [place] prefers to come [place] and be seen by me [facility] [address].  Had COVID/respiratory distress leading to admission in January 2022.  It was felt that there was a component of heart failure present.  Still having difficulty with exertional fatigue and dyspnea.  She does not sleep well.  We have never done a sleep study.  She cannot tolerate amiodarone.  She is therefore in atrial fibrillation continuously.  Rate control has required both digoxin and relatively high dose Toprol-XL.  No peripheral edema.  No angina.  No episodes of syncope.  Already feels she urinates too much because of furosemide 20 mg.  Past Medical History:  Diagnosis Date  . Arthritis   . Asthma   . CHF (congestive heart failure) (HCC)   . Diabetes mellitus (HCC)   . Diabetes mellitus without  complication (HCC)   . Enlarged heart   . Hypercholesterolemia   . Hyperlipidemia   . Ischemic cardiomyopathy    a. EF 30-35% by echo in 06/2019  . STEMI (ST elevation myocardial infarction) (HCC)    a. 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    Past Surgical History:  Procedure Laterality Date  . ABDOMINAL HYSTERECTOMY    . CATARACT EXTRACTION Terry Hanson/PHACO Right 06/27/2020   Procedure: CATARACT EXTRACTION PHACO AND INTRAOCULAR LENS PLACEMENT RIGHT EYE AND PLACEMENT OF CORTICOSTEROID;  Surgeon: Fabio PierceWrzosek, James, MD;  Location: [facility];  Service: Ophthalmology;  Laterality: Right;  right CDE=25.47  . CORONARY/GRAFT ACUTE MI REVASCULARIZATION N/A 06/29/2019   Procedure: Coronary/Graft Acute MI Revascularization;  Surgeon: Lyn RecordsSmith, Clemon Devaul W, MD;  [facility];  Service: Cardiovascular;  Laterality: N/A;  . ICD IMPLANT N/A 12/14/2019   Procedure: ICD IMPLANT;  Surgeon: Marinus Mawaylor, Gregg W, MD;  [facility];  Service: Cardiovascular;  Laterality: N/A;  . LEFT HEART CATH AND CORONARY ANGIOGRAPHY N/A 06/29/2019   Procedure: LEFT HEART CATH AND CORONARY ANGIOGRAPHY;  Surgeon: Lyn RecordsSmith, Rasheema Truluck W, MD;  Location: [facility] INVASIVE CV LAB;  Service: Cardiovascular;  Laterality: N/A;    Current Medications: Current Meds  Medication Sig  . dapagliflozin propanediol (FARXIGA) 10 MG TABS tablet Take 1 tablet (10 mg total) by mouth daily before breakfast.  . digoxin (LANOXIN) 0.125 MG tablet Take by mouth 3 (three) times a week. Taking Monday, Wednesday, and Friday.  Marland Kitchen. ELIQUIS 5 MG TABS tablet TAKE 1 TABLET BY MOUTH TWICE DAILY  .  levothyroxine (SYNTHROID) 25 MCG tablet Take 1 tablet (25 mcg total) by mouth daily before breakfast.  . metoprolol succinate (TOPROL-XL) 100 MG 24 hr tablet Take 100 mg by mouth daily.  . nitroGLYCERIN (NITROSTAT) 0.4 MG SL tablet Place 1 tablet (0.4 mg total) under the tongue every 5 (five) minutes as needed.  . nystatin (MYCOSTATIN/NYSTOP) powder Apply  topically 2 (two) times daily.  . sacubitril-valsartan (ENTRESTO) 24-26 MG Take 1 tablet by mouth 2 (two) times daily.  . vitamin B-12 (CYANOCOBALAMIN) 1000 MCG tablet Take 1,000 mcg by mouth daily.  . [DISCONTINUED] clopidogrel (PLAVIX) 75 MG tablet Take 75 mg by mouth daily before breakfast.      Allergies:   Codeine, Shrimp [shellfish allergy], Penicillins, and Sulfa antibiotics   Social History   Socioeconomic History  . Marital status: Married    Spouse name: Not on file  . Number of children: Not on file  . Years of education: Not on file  . Highest education level: Not on file  Occupational History  . Not on file  Tobacco Use  . Smoking status: Never Smoker  . Smokeless tobacco: Former Neurosurgeon    Types: Engineer, drilling  . Vaping Use: Never used  Substance and Sexual Activity  . Alcohol use: Not Currently  . Drug use: Never  . Sexual activity: Not on file  Other Topics Concern  . Not on file  Social History Narrative   ** Merged History Encounter **       Social Determinants of Health   Financial Resource Strain: Not on file  Food Insecurity: Not on file  Transportation Needs: Not on file  Physical Activity: Not on file  Stress: Not on file  Social Connections: Not on file     Family History: The patient's family history includes Heart disease in her mother.  ROS:   Please see the history of present illness.    Appetite has been great.  No neurological symptoms.  No nausea or vomiting since stopping amiodarone.  She takes digoxin 3 times per week, Monday Wednesday and Friday.  She is on apixaban and clopidogrel.  STEMI was a year ago.  Without a point where consider ration of Plavix discontinuation is possible.  All other systems reviewed and are negative.  EKGs/Labs/Other Studies Reviewed:    The following studies were reviewed today:  ECHOCARDIOGRAM 03/20/2020: IMPRESSIONS    1. Akinesis of the anterior, septal and apical walls with overall severe  LV  dysfunction; no apical thrombus noted using definity.  2. Left ventricular ejection fraction, by estimation, is 25 to 30%. The  left ventricle has severely decreased function. The left ventricle  demonstrates regional wall motion abnormalities (see scoring  diagram/findings for description). Left ventricular  diastolic function could not be evaluated.  3. Right ventricular systolic function is normal. The right ventricular  size is normal. There is normal pulmonary artery systolic pressure.  4. The mitral valve is normal in structure. Mild mitral valve  regurgitation. No evidence of mitral stenosis.  5. The aortic valve has an indeterminant number of cusps. Aortic valve  regurgitation is not visualized. No aortic stenosis is present.  6. The inferior vena cava is normal in size with greater than 50%  respiratory variability, suggesting right atrial pressure of 3 mmHg.   EKG:  EKG most recent electrocardiogram done in January revealed low voltage, atrial fib with controlled rate below 100 bpm.  Nonspecific ST-T abnormality is noted.  Recent Labs: 08/04/2019: NT-Pro BNP 19,944  09/02/2019: TSH 4.930 03/19/2020: B Natriuretic Peptide 1,686.0 03/21/2020: ALT 11; BUN 43; Creatinine, Ser 1.74; Hemoglobin 9.7; Magnesium 2.1; Platelets 164; Potassium 4.4; Sodium 135  Recent Lipid Panel    Component Value Date/Time   CHOL 106 12/02/2019 1134   TRIG 136 03/19/2020 2220   HDL 31 (L) 12/02/2019 1134   CHOLHDL 5.2 06/29/2019 2218   VLDL 23 06/29/2019 2218   LDLCALC 50 12/02/2019 1134    Physical Exam:    VS:  BP 106/60   Pulse 78   Ht  (1.575 m)   Wt 191 lb 6.4 oz (86.8 kg)   SpO2 98%   BMI 35.01 kg/m     Wt Readings from Last 3 Encounters:  07/06/20 191 lb 6.4 oz (86.8 kg)  06/24/20 195 lb (88.5 kg)  03/20/20 190 lb (86.2 kg)     GEN: Obese. No acute distress HEENT: Normal NECK: No JVD. LYMPHATICS: No lymphadenopathy CARDIAC: No murmur. IIRR no gallop, or edema. VASCULAR:   Normal Pulses. No bruits. RESPIRATORY:  Clear to auscultation without rales, wheezing or rhonchi  ABDOMEN: Soft, non-tender, non-distended, No pulsatile mass, MUSCULOSKELETAL: No deformity  SKIN: Warm and dry NEUROLOGIC:  Alert and oriented x 3 PSYCHIATRIC:  Normal affect   ASSESSMENT:    1. Acute on chronic systolic CHF (congestive heart failure) (HCC)   2. Persistent atrial fibrillation (HCC)   3. Coronary artery disease involving native coronary artery of native heart without angina pectoris   4. Ischemic cardiomyopathy   5. ICD (implantable cardioverter-defibrillator) in place   6. Stage 3 chronic kidney disease, unspecified whether stage 3a or 3b CKD (HCC)   7. Hyperlipidemia, unspecified hyperlipidemia type   8. 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 Tobacco abuse   10. Chronic anticoagulation   11. High risk medication use    PLAN:    In order of problems listed above:  1. Continues with low EF.  Current therapy is beta-blocker/low-dose Entresto/and for symptom control furosemide 20 mg/day.  Add dapagliflozin 10 mg/day.  Basic metabolic panel today and in 10 days.  We will also check a dig level today.  Referral to advanced heart failure clinic for advice relative to therapy and whether there are other options.  I have stayed away from MRA therapy with current renal function (creatinine 1.74 in January 2021).  If kidney function is better, consider adding MRA therapy. 2. Check dig level. 3. Secondary prevention discussed.  Continue high intensity rosuvastatin 2 days/week.  Consider PCSK 9 therapy. 4. Heart failure clinic referral 5. Followed on device clinic 6. Basic metabolic panel today. 7. Had difficulty with high intensity statin therapy on a daily basis.  Now, supposedly on 40 mg Monday and Thursday.  When asked about it, she cannot recall if she is taking it so she will call  us back to let us know.  May need to progress to PCSK9. 8. Add Farxiga 10 mg/day 9. Abstaining from smoking. 10. Continue apixaban. 11. Unable to tolerate amiodarone   Overall education and awareness concerning primary/secondary risk prevention was discussed in detail: LDL less than 70, hemoglobin A1c less than 7, blood pressure target less than 130/80 mmHg, >150 minutes of moderate aerobic activity per week, avoidance of smoking, weight control (via diet and exercise), and continued surveillance/management of/for obstructive sleep apnea.  Guideline directed therapy for left ventricular systolic dysfunction: Angiotensin receptor-neprilysin inhibitor (ARNI)-Entresto; beta-blocker therapy - carvedilol,  metoprolol succinate, or bisoprolol; mineralocorticoid receptor antagonist (MRA) therapy -spironolactone or eplerenone.  SGLT-2 agents -  Dapagliflozin [name]) or Empagliflozin (Jardiance).These therapies have been shown to improve clinical outcomes including reduction of rehospitalization, survival, and acute heart failure.  Advanced HF clinic to evaluate and recommend if there are gaps or other strategies that should be considered.   Medication Adjustments/Labs and Tests Ordered: Current medicines are reviewed at length with the patient today.  Concerns regarding medicines are outlined above.  Orders Placed This Encounter  Procedures  . Digoxin level  . Basic metabolic panel  . Basic metabolic panel  . AMB referral to CHF clinic   Meds ordered this encounter  Medications  . clopidogrel (PLAVIX) 75 MG tablet    Sig: Take 1 tablet (75 mg total) by mouth daily before breakfast.    Dispense:  90 tablet    Refill:  3  . furosemide (LASIX) 20 MG tablet    Sig: Take 1 tablet (20 mg total) by mouth daily. May take a whole pill for 2-3 pound weight gain over night, shortness of breath or edema.    Dispense:  30 tablet    Refill:  3  . dapagliflozin propanediol (FARXIGA) 10 MG TABS tablet     Sig: Take 1 tablet (10 mg total) by mouth daily before breakfast.    Dispense:  30 tablet    Refill:  11    Patient Instructions  Medication Instructions:  1) START Farxiga 10mg  once daily  *If you need a refill on your cardiac medications before your next appointment, please call your pharmacy*   Lab Work: BMET and Digoxin level today  BMET in 7-10 days.  Please take attached lab order to your local [facility] to have drawn.   If you have labs (blood work) drawn today and your tests are completely normal, you will receive your results only by: 9-10 MyChart Message (if you have MyChart) OR . A paper copy in the mail If you have any lab test that is abnormal or we need to change your treatment, we will call you to review the results.   Testing/Procedures: None   Follow-Up: At [facility], you and your health needs are our priority.  As part of our continuing mission to provide you with exceptional heart care, we have created designated Provider Care Teams.  These Care Teams include your primary Cardiologist (physician) and Advanced Practice Providers (APPs -  Physician Assistants and Nurse Practitioners) who all work together to provide you with the care you need, when you need it.  We recommend signing up for the patient portal called "MyChart".  Sign up information is provided on this After Visit Summary.  MyChart is used to connect with patients for Virtual Visits (Telemedicine).  Patients are able to view lab/test results, encounter notes, upcoming appointments, etc.  Non-urgent messages can be sent to your provider as well.   To learn more about what you can do with MyChart, go to [facility].    Your next appointment:   3 month(s)  The format for your next appointment:   In Person  Provider:   You may see [email or web address], MD or one of the following Advanced Practice Providers on your designated Care Team:    Lesleigh Noe, NP    Other Instructions  You  have been referred to our Advanced Heart Failure Clinic.  That office will contact you to schedule an appointment.     Signed, Georgie Chard III,  MD  07/06/2020 9:38 AM    [facility]

## 2020-07-06 ENCOUNTER — Other Ambulatory Visit: Payer: Self-pay

## 2020-07-06 ENCOUNTER — Encounter (HOSPITAL_COMMUNITY)
Admit: 2020-07-06 | Discharge: 2020-07-06 | Disposition: A | Discharge status: Home / Self Care | Payer: Medicare Other | Attending: Ophthalmology | Admitting: Ophthalmology

## 2020-07-06 ENCOUNTER — Ambulatory Visit (INDEPENDENT_AMBULATORY_CARE_PROVIDER_SITE_OTHER): Payer: Medicare Other | Admitting: Interventional Cardiology

## 2020-07-06 ENCOUNTER — Encounter: Payer: Self-pay | Admitting: Interventional Cardiology

## 2020-07-06 ENCOUNTER — Encounter (HOSPITAL_COMMUNITY): Payer: Self-pay

## 2020-07-06 VITALS — BP 106/60 | HR 78 | Ht 62.0 in | Wt 191.4 lb

## 2020-07-06 DIAGNOSIS — N183 Chronic kidney disease, stage 3 unspecified: Secondary | ICD-10-CM | POA: Diagnosis not present

## 2020-07-06 DIAGNOSIS — I255 Ischemic cardiomyopathy: Secondary | ICD-10-CM | POA: Diagnosis not present

## 2020-07-06 DIAGNOSIS — Z79899 Other long term (current) drug therapy: Secondary | ICD-10-CM

## 2020-07-06 DIAGNOSIS — Z720 Tobacco use: Secondary | ICD-10-CM | POA: Diagnosis not present

## 2020-07-06 DIAGNOSIS — E1152 Type 2 diabetes mellitus with diabetic peripheral angiopathy with gangrene: Secondary | ICD-10-CM | POA: Diagnosis not present

## 2020-07-06 DIAGNOSIS — I4819 Other persistent atrial fibrillation: Secondary | ICD-10-CM

## 2020-07-06 DIAGNOSIS — E785 Hyperlipidemia, unspecified: Secondary | ICD-10-CM | POA: Diagnosis not present

## 2020-07-06 DIAGNOSIS — Z7901 Long term (current) use of anticoagulants: Secondary | ICD-10-CM | POA: Diagnosis not present

## 2020-07-06 DIAGNOSIS — Z95810 Presence of automatic (implantable) cardiac defibrillator: Secondary | ICD-10-CM

## 2020-07-06 DIAGNOSIS — I5023 Acute on chronic systolic (congestive) heart failure: Secondary | ICD-10-CM | POA: Diagnosis not present

## 2020-07-06 DIAGNOSIS — Z794 Long term (current) use of insulin: Secondary | ICD-10-CM

## 2020-07-06 DIAGNOSIS — I2510 Atherosclerotic heart disease of native coronary artery without angina pectoris: Secondary | ICD-10-CM

## 2020-07-06 MED ORDER — FUROSEMIDE 20 MG PO TABS: 20.0000 mg | ORAL_TABLET | Freq: Every day | ORAL | 3 refills | Status: DC

## 2020-07-06 MED ORDER — CLOPIDOGREL BISULFATE 75 MG PO TABS: 75.0000 mg | ORAL_TABLET | Freq: Every day | ORAL | 3 refills | Status: DC

## 2020-07-06 MED ORDER — DAPAGLIFLOZIN PROPANEDIOL 10 MG PO TABS: 10.0000 mg | ORAL_TABLET | Freq: Every day | ORAL | 11 refills | Status: DC

## 2020-07-06 NOTE — Patient Instructions (Signed)
Medication Instructions:  1) START Farxiga 10mg  once daily  *If you need a refill on your cardiac medications before your next appointment, please call your pharmacy*   Lab Work: BMET and Digoxin level today  BMET in 7-10 days.  Please take attached lab order to your local [facility] to have drawn.   If you have labs (blood work) drawn today and your tests are completely normal, you will receive your results only by: 9-10 MyChart Message (if you have MyChart) OR . A paper copy in the mail If you have any lab test that is abnormal or we need to change your treatment, we will call you to review the results.   Testing/Procedures: None   Follow-Up: At [facility], you and your health needs are our priority.  As part of our continuing mission to provide you with exceptional heart care, we have created designated Provider Care Teams.  These Care Teams include your primary Cardiologist (physician) and Advanced Practice Providers (APPs -  Physician Assistants and Nurse Practitioners) who all work together to provide you with the care you need, when you need it.  We recommend signing up for the patient portal called "MyChart".  Sign up information is provided on this After Visit Summary.  MyChart is used to connect with patients for Virtual Visits (Telemedicine).  Patients are able to view lab/test results, encounter notes, upcoming appointments, etc.  Non-urgent messages can be sent to your provider as well.   To learn more about what you can do with MyChart, go to [facility].    Your next appointment:   3 month(s)  The format for your next appointment:   In Person  Provider:   You may see [email or web address], MD or one of the following Advanced Practice Providers on your designated Care Team:    Lesleigh Noe, NP    Other Instructions  You have been referred to our Advanced Heart Failure Clinic.  That office will contact you to schedule an appointment.

## 2020-07-07 LAB — BASIC METABOLIC PANEL
BUN/Creatinine Ratio: 13 (ref 12–28)
BUN: 21 mg/dL (ref 8–27)
CO2: 24 mmol/L (ref 20–29)
Calcium: 10 mg/dL (ref 8.7–10.3)
Chloride: 99 mmol/L (ref 96–106)
Creatinine, Ser: 1.64 mg/dL — ABNORMAL HIGH (ref 0.57–1.00)
Glucose: 156 mg/dL — ABNORMAL HIGH (ref 65–99)
Potassium: 4.5 mmol/L (ref 3.5–5.2)
Sodium: 142 mmol/L (ref 134–144)
eGFR: 32 mL/min/{1.73_m2} — ABNORMAL LOW (ref >59)

## 2020-07-07 LAB — DIGOXIN LEVEL: Digoxin, Serum: 0.6 ng/mL (ref 0.5–0.9)

## 2020-07-08 ENCOUNTER — Other Ambulatory Visit: Payer: Self-pay

## 2020-07-08 ENCOUNTER — Telehealth: Payer: Self-pay

## 2020-07-08 ENCOUNTER — Other Ambulatory Visit (HOSPITAL_COMMUNITY)
Admission: RE | Admit: 2020-07-08 | Discharge: 2020-07-08 | Disposition: A | Discharge status: Home / Self Care | Payer: Medicare Other | Source: Ambulatory Visit | Attending: Ophthalmology | Admitting: Ophthalmology

## 2020-07-08 DIAGNOSIS — Z01812 Encounter for preprocedural laboratory examination: Secondary | ICD-10-CM | POA: Diagnosis not present

## 2020-07-08 DIAGNOSIS — Z20822 Contact with and (suspected) exposure to covid-19: Secondary | ICD-10-CM | POA: Insufficient documentation

## 2020-07-08 NOTE — Telephone Encounter (Addendum)
**Note De-identified Cresencia Asmus Obfuscation** -----  **Note De-Identified Dick Hark Obfuscation** Message from [name], RN sent at 07/06/2020  9:35 AM EDT -----   Just wanted to let you know, I sent this pt home with several phone numbers today because of the cost of her medications.  We started her on Farxiga, so that was one I sent her home with but she also has samples.  I also sent her with the number for Entresto and Eliquis.  Both are costing over $100 for a 1 month supply.

## 2020-07-08 NOTE — Telephone Encounter (Signed)
**Note De-Identified Rayjon Wery Obfuscation** I called the pt to f/u with her concerning her applying for pt asst with [employer] for her [place] [employer] for [employer], and [place] and [place] for [place].  No answer so I left a detailed message on her VM (ok per DPR) advising her to call each foundations [employer] [phone], [employer] [phone], and [place] and [place] [phone]) and to request that each of them mail her an application to her home address and that once she receives them to complete her parts on each application, obtain required documents per each Foundations, and to bring all to Dr Lonn Georgia office at [employer] at [employer] in [place] to drop off and that we will handle the provider page of each application and will fax all to appropriate Foundations.  I did advise in the VM message that she call Larita Fife at Dr Michaelle Copas office at [facility] at [phone] if she has any questions.

## 2020-07-09 LAB — SARS CORONAVIRUS 2 (TAT 6-24 HRS): SARS Coronavirus 2: NEGATIVE

## 2020-07-11 ENCOUNTER — Ambulatory Visit (HOSPITAL_COMMUNITY)
Admission: RE | Admit: 2020-07-11 | Discharge: 2020-07-11 | Disposition: A | Discharge status: Home / Self Care | Payer: Medicare Other | Attending: Ophthalmology | Admitting: Ophthalmology

## 2020-07-11 ENCOUNTER — Encounter (HOSPITAL_COMMUNITY): Payer: Self-pay | Admitting: Ophthalmology

## 2020-07-11 ENCOUNTER — Ambulatory Visit (HOSPITAL_COMMUNITY): Payer: Medicare Other | Admitting: Anesthesiology

## 2020-07-11 ENCOUNTER — Encounter (HOSPITAL_COMMUNITY)
Admission: RE | Disposition: A | Discharge status: Home / Self Care | Payer: Self-pay | Source: Home / Self Care | Attending: Ophthalmology

## 2020-07-11 DIAGNOSIS — H25812 Combined forms of age-related cataract, left eye: Secondary | ICD-10-CM | POA: Diagnosis not present

## 2020-07-11 DIAGNOSIS — Z961 Presence of intraocular lens: Secondary | ICD-10-CM | POA: Insufficient documentation

## 2020-07-11 DIAGNOSIS — H5712 Ocular pain, left eye: Secondary | ICD-10-CM | POA: Diagnosis not present

## 2020-07-11 DIAGNOSIS — Z7902 Long term (current) use of antithrombotics/antiplatelets: Secondary | ICD-10-CM | POA: Insufficient documentation

## 2020-07-11 DIAGNOSIS — Z79899 Other long term (current) drug therapy: Secondary | ICD-10-CM | POA: Diagnosis not present

## 2020-07-11 DIAGNOSIS — Z79890 Hormone replacement therapy (postmenopausal): Secondary | ICD-10-CM | POA: Diagnosis not present

## 2020-07-11 DIAGNOSIS — Z9841 Cataract extraction status, right eye: Secondary | ICD-10-CM | POA: Diagnosis not present

## 2020-07-11 DIAGNOSIS — I2510 Atherosclerotic heart disease of native coronary artery without angina pectoris: Secondary | ICD-10-CM | POA: Diagnosis not present

## 2020-07-11 DIAGNOSIS — Z7901 Long term (current) use of anticoagulants: Secondary | ICD-10-CM | POA: Insufficient documentation

## 2020-07-11 DIAGNOSIS — Z885 Allergy status to narcotic agent status: Secondary | ICD-10-CM | POA: Insufficient documentation

## 2020-07-11 DIAGNOSIS — Z880 Allergy status to penicillin: Secondary | ICD-10-CM | POA: Diagnosis not present

## 2020-07-11 DIAGNOSIS — Z7952 Long term (current) use of systemic steroids: Secondary | ICD-10-CM | POA: Insufficient documentation

## 2020-07-11 DIAGNOSIS — E1136 Type 2 diabetes mellitus with diabetic cataract: Secondary | ICD-10-CM | POA: Diagnosis not present

## 2020-07-11 LAB — GLUCOSE, CAPILLARY: Glucose-Capillary: 149 mg/dL — ABNORMAL HIGH (ref 70–99)

## 2020-07-11 SURGERY — CATARACT EXTRACTION PHACO AND INTRAOCULAR LENS PLACEMENT (IOC) with placement of Corticosteroid
Anesthesia: Monitor Anesthesia Care | Site: Eye | Laterality: Left

## 2020-07-11 MED ORDER — PHENYLEPHRINE HCL 2.5 % OP SOLN
1.0000 [drp] | OPHTHALMIC | Status: AC | PRN
  Administered 2020-07-11 (×3): 1 [drp] via OPHTHALMIC

## 2020-07-11 MED ORDER — EPINEPHRINE PF 1 MG/ML IJ SOLN
INTRAOCULAR | Status: DC | PRN
  Administered 2020-07-11: 500 mL

## 2020-07-11 MED ORDER — SODIUM HYALURONATE 10 MG/ML IO SOLUTION
PREFILLED_SYRINGE | INTRAOCULAR | Status: DC | PRN
  Administered 2020-07-11: 0.85 mL via INTRAOCULAR

## 2020-07-11 MED ORDER — EPINEPHRINE PF 1 MG/ML IJ SOLN
INTRAMUSCULAR | Status: AC
  Filled 2020-07-11: qty 2

## 2020-07-11 MED ORDER — LIDOCAINE HCL 3.5 % OP GEL
1.0000 "application " | Freq: Once | OPHTHALMIC | Status: AC
  Administered 2020-07-11: 1 via OPHTHALMIC

## 2020-07-11 MED ORDER — ORAL CARE MOUTH RINSE: 15.0000 mL | Freq: Once | OROMUCOSAL | Status: DC

## 2020-07-11 MED ORDER — BSS IO SOLN
INTRAOCULAR | Status: DC | PRN
  Administered 2020-07-11: 15 mL

## 2020-07-11 MED ORDER — SODIUM CHLORIDE 0.9% FLUSH
INTRAVENOUS | Status: DC | PRN
  Administered 2020-07-11: 10 mL via INTRAVENOUS

## 2020-07-11 MED ORDER — MIDAZOLAM HCL 5 MG/5ML IJ SOLN
INTRAMUSCULAR | Status: DC | PRN
  Administered 2020-07-11: 1 mg via INTRAVENOUS

## 2020-07-11 MED ORDER — SODIUM HYALURONATE 23MG/ML IO SOSY
PREFILLED_SYRINGE | INTRAOCULAR | Status: DC | PRN
  Administered 2020-07-11: 0.6 mL via INTRAOCULAR

## 2020-07-11 MED ORDER — MIDAZOLAM HCL 2 MG/2ML IJ SOLN
INTRAMUSCULAR | Status: AC
  Filled 2020-07-11: qty 2

## 2020-07-11 MED ORDER — POVIDONE-IODINE 5 % OP SOLN
OPHTHALMIC | Status: DC | PRN
  Administered 2020-07-11: 1 via OPHTHALMIC

## 2020-07-11 MED ORDER — DEXAMETHASONE 0.4 MG OP INST
VAGINAL_INSERT | OPHTHALMIC | Status: DC | PRN
  Administered 2020-07-11: 0.4 mg via OPHTHALMIC

## 2020-07-11 MED ORDER — STERILE WATER FOR IRRIGATION IR SOLN
Status: DC | PRN
  Administered 2020-07-11: 250 mL

## 2020-07-11 MED ORDER — LIDOCAINE HCL (PF) 1 % IJ SOLN
INTRAOCULAR | Status: DC | PRN
  Administered 2020-07-11: 1 mL via OPHTHALMIC

## 2020-07-11 MED ORDER — TROPICAMIDE 1 % OP SOLN
1.0000 [drp] | OPHTHALMIC | Status: AC
  Administered 2020-07-11 (×3): 1 [drp] via OPHTHALMIC

## 2020-07-11 MED ORDER — TETRACAINE HCL 0.5 % OP SOLN
1.0000 [drp] | OPHTHALMIC | Status: AC | PRN
  Administered 2020-07-11 (×3): 1 [drp] via OPHTHALMIC

## 2020-07-11 MED ORDER — CHLORHEXIDINE GLUCONATE 0.12 % MT SOLN: 15.0000 mL | Freq: Once | OROMUCOSAL | Status: DC

## 2020-07-11 SURGICAL SUPPLY — 12 items
CLOTH BEACON ORANGE TIMEOUT ST (SAFETY) ×1 IMPLANT
EYE SHIELD UNIVERSAL CLEAR (GAUZE/BANDAGES/DRESSINGS) ×1 IMPLANT
GLOVE SURG UNDER POLY LF SZ7 (GLOVE) ×2 IMPLANT
NDL HYPO 18GX1.5 BLUNT FILL (NEEDLE) IMPLANT
NEEDLE HYPO 18GX1.5 BLUNT FILL (NEEDLE) ×2 IMPLANT
PAD ARMBOARD 7.5X6 YLW CONV (MISCELLANEOUS) ×1 IMPLANT
SYR TB 1ML LL NO SAFETY (SYRINGE) ×1 IMPLANT
TAPE SURG TRANSPORE 1 IN (GAUZE/BANDAGES/DRESSINGS) IMPLANT
TAPE SURGICAL TRANSPORE 1 IN (GAUZE/BANDAGES/DRESSINGS) ×2
TECNIS 1 PIECE IOL (Intraocular Lens) ×1 IMPLANT
VISCOELASTIC ADDITIONAL (OPHTHALMIC RELATED) IMPLANT
WATER STERILE IRR 250ML POUR (IV SOLUTION) ×1 IMPLANT

## 2020-07-11 NOTE — Anesthesia Preprocedure Evaluation (Signed)
Anesthesia Evaluation  Patient identified by MRN, date of birth, ID band Patient awake    Reviewed: Allergy & Precautions, NPO status , Patient's Chart, lab work & pertinent test results  Airway Mallampati: II  TM Distance: >3 FB Neck ROM: Full    Dental  (+) Upper Dentures, Lower Dentures   Pulmonary shortness of breath and with exertion, asthma ,    Pulmonary exam normal breath sounds clear to auscultation       Cardiovascular Exercise Tolerance: Poor + CAD, + Past MI, +CHF (EF 25 to 30%) and + DOE  Normal cardiovascular exam+ dysrhythmias Atrial Fibrillation + Cardiac Defibrillator  Rhythm:Regular Rate:Normal  1. Akinesis of the anterior, septal and apical walls with overall severe  LV dysfunction; no apical thrombus noted using definity.  2. Left ventricular ejection fraction, by estimation, is 25 to 30%. The  left ventricle has severely decreased function. The left ventricle  demonstrates regional wall motion abnormalities (see scoring  diagram/findings for description). Left ventricular  diastolic function could not be evaluated.  3. Right ventricular systolic function is normal. The right ventricular  size is normal. There is normal pulmonary artery systolic pressure.  4. The mitral valve is normal in structure. Mild mitral valve  regurgitation. No evidence of mitral stenosis.  5. The aortic valve has an indeterminant number of cusps. Aortic valve  regurgitation is not visualized. No aortic stenosis is present.  6. The inferior vena cava is normal in size with greater than 50%  respiratory variability, suggesting right atrial pressure of 3 mmHg.    Neuro/Psych negative neurological ROS  negative psych ROS   GI/Hepatic negative GI ROS, Neg liver ROS,   Endo/Other  diabetes, Well Controlled, Type 2Hypothyroidism   Renal/GU Renal InsufficiencyRenal disease     Musculoskeletal  (+) Arthritis ,   Abdominal    Peds  Hematology   Anesthesia Other Findings   Reproductive/Obstetrics                            Anesthesia Physical  Anesthesia Plan  ASA: IV  Anesthesia Plan: MAC   Post-op Pain Management:    Induction:   PONV Risk Score and Plan:   Airway Management Planned: Nasal Cannula and Natural Airway  Additional Equipment:   Intra-op Plan:   Post-operative Plan:   Informed Consent: I have reviewed the patients History and Physical, chart, labs and discussed the procedure including the risks, benefits and alternatives for the proposed anesthesia with the patient or authorized representative who has indicated his/her understanding and acceptance.     Dental advisory given  Plan Discussed with: CRNA and Surgeon  Anesthesia Plan Comments:         Anesthesia Quick Evaluation

## 2020-07-11 NOTE — Interval H&P Note (Signed)
History and Physical Interval Note:  07/11/2020 1:51 PM  [name]  has presented today for surgery, with the diagnosis of Nuclear sclerotic cataract - Left eye.  The various methods of treatment have been discussed with the patient and family. After consideration of risks, benefits and other options for treatment, the patient has consented to  Procedure(s) with comments: CATARACT EXTRACTION PHACO AND INTRAOCULAR LENS PLACEMENT LEFT EYE (Left) - left as a surgical intervention.  The patient's history has been reviewed, patient examined, no change in status, stable for surgery.  I have reviewed the patient's chart and labs.  Questions were answered to the patient's satisfaction.     Terry Hanson

## 2020-07-11 NOTE — Op Note (Signed)
Date of procedure: 07/11/20  Pre-operative diagnosis: Visually significant age-related combined cataract, Left Eye (H25.812)  Post-operative diagnosis:  1. Visually significant age-related combined cataract, Left Eye (H25.812) 2.   Pain and inflammation following cataract surgery, Left Eye (H57.12)  Procedure:  1. Removal of cataract via phacoemulsification and insertion of intra-ocular lens [place]  +24.0D into the capsular bag of the Left Eye 2. Placement of Dextenza Implant, Left Lower Lid  Attending surgeon: Gerda Diss. Jeremaine Maraj, MD, MA  Anesthesia: MAC, Topical Akten  Complications: None  Estimated Blood Loss: <26m (minimal)  Specimens: None  Implants: As above  Indications:  Visually significant age-related cataract, Left Eye  Procedure:  The patient was seen and identified in the pre-operative area. The operative eye was identified and dilated.  The operative eye was marked.  Topical anesthesia was administered to the operative eye.     The patient was then to the operative suite and placed in the supine position.  A timeout was performed confirming the patient, procedure to be performed, and all other relevant information.   The patient's face was prepped and draped in the usual fashion for intra-ocular surgery.  A lid speculum was placed into the operative eye and the surgical microscope moved into place and focused.  An inferotemporal paracentesis was created using a 20 gauge paracentesis blade.  Shugarcaine was injected into the anterior chamber.  Viscoelastic was injected into the anterior chamber.  A temporal clear-corneal main wound incision was created using a 2.459mmicrokeratome.  A continuous curvilinear capsulorrhexis was initiated using an irrigating cystitome and completed using capsulorrhexis forceps.  Hydrodissection and hydrodeliniation were performed.  Viscoelastic was injected into the anterior chamber.  A phacoemulsification handpiece and a  chopper as a second instrument were used to remove the nucleus and epinucleus. The irrigation/aspiration handpiece was used to remove any remaining cortical material.   The capsular bag was reinflated with viscoelastic, checked, and found to be intact.  The intraocular lens was inserted into the capsular bag.  The irrigation/aspiration handpiece was used to remove any remaining viscoelastic.  The clear corneal wound and paracentesis wounds were then hydrated and checked with Weck-Cels to be watertight.  The lid-speculum was removed.    A Dextenza implant was placed in the lower canaliculus.   The drape was removed.  The patient's face was cleaned with a wet and dry 4x4.   A clear shield was taped over the eye. The patient was taken to the post-operative care unit in good condition, having tolerated the procedure well.  Post-Op Instructions: The patient will follow up at [facility] a same day post-operative evaluation and will receive all other orders and instructions.

## 2020-07-11 NOTE — Anesthesia Postprocedure Evaluation (Signed)
Anesthesia Post Note  Patient: Stephonie W Gwynn  Procedure(s) Performed: CATARACT EXTRACTION PHACO AND INTRAOCULAR LENS PLACEMENT LEFT EYE WITH PLACEMENT OF CORTICOSTEROID (Left Eye)  Patient location during evaluation: PACU Anesthesia Type: MAC Level of consciousness: awake and alert Pain management: pain level controlled Vital Signs Assessment: post-procedure vital signs reviewed and stable Respiratory status: spontaneous breathing Cardiovascular status: blood pressure returned to baseline and stable Postop Assessment: no apparent nausea or vomiting Anesthetic complications: no   No complications documented.   Last Vitals:  Vitals:   07/11/20 1222 07/11/20 1225  BP:  (!) 106/37  Pulse: 73   Resp: 18   Temp: 36.6 C   SpO2: 100%     Last Pain:  Vitals:   07/11/20 1222  TempSrc: Oral  PainSc: 0-No pain                 Jyl Chico

## 2020-07-11 NOTE — Transfer of Care (Signed)
Immediate Anesthesia Transfer of Care Note  Patient: Terry Hanson  Procedure(s) Performed: CATARACT EXTRACTION PHACO AND INTRAOCULAR LENS PLACEMENT LEFT EYE WITH PLACEMENT OF CORTICOSTEROID (Left Eye)  Patient Location: Short Stay  Anesthesia Type:MAC  Level of Consciousness: awake  Airway & Oxygen Therapy: Patient Spontanous Breathing  Post-op Assessment: Report given to RN  Post vital signs: Reviewed and stable  Last Vitals:  Vitals Value Taken Time  BP    Temp    Pulse    Resp    SpO2      Last Pain:  Vitals:   07/11/20 1222  TempSrc: Oral  PainSc: 0-No pain      Patients Stated Pain Goal: 5 (51/89/84 2103)  Complications: No complications documented.

## 2020-07-11 NOTE — Discharge Instructions (Addendum)
Please discharge patient when stable, will follow up today with Dr. Wrzosek at the [facility] office immediately following discharge.  Leave shield in place until visit.  All paperwork with discharge instructions will be given at the office.  [facility] Address:  [address]  [place], [place] [address]             Monitored Anesthesia Care, Care After This sheet gives you information about how to care for yourself after your procedure. Your health care provider may also give you more specific instructions. If you have problems or questions, contact your health care provider. What can I expect after the procedure? After the procedure, it is common to have:  Tiredness.  Forgetfulness about what happened after the procedure.  Impaired judgment for important decisions.  Nausea or vomiting.  Some difficulty with balance. Follow these instructions at home: For the time period you were told by your health care provider:  Rest as needed.  Do not participate in activities where you could fall or become injured.  Do not drive or use machinery.  Do not drink alcohol.  Do not take sleeping pills or medicines that cause drowsiness.  Do not make important decisions or sign legal documents.  Do not take care of children on your own.      Eating and drinking  Follow the diet that is recommended by your health care provider.  Drink enough fluid to keep your urine pale yellow.  If you vomit: ? Drink water, juice, or soup when you can drink without vomiting. ? Make sure you have little or no nausea before eating solid foods. General instructions  Have a responsible adult stay with you for the time you are told. It is important to have someone help care for you until you are awake and alert.  Take over-the-counter and prescription medicines only as told by your health care provider.  If you have sleep apnea, surgery and certain  medicines can increase your risk for breathing problems. Follow instructions from your health care provider about wearing your sleep device: ? Anytime you are sleeping, including during daytime naps. ? While taking prescription pain medicines, sleeping medicines, or medicines that make you drowsy.  Avoid smoking.  Keep all follow-up visits as told by your health care provider. This is important. Contact a health care provider if:  You keep feeling nauseous or you keep vomiting.  You feel light-headed.  You are still sleepy or having trouble with balance after 24 hours.  You develop a rash.  You have a fever.  You have redness or swelling around the IV site. Get help right away if:  You have trouble breathing.  You have new-onset confusion at home. Summary  For several hours after your procedure, you may feel tired. You may also be forgetful and have poor judgment.  Have a responsible adult stay with you for the time you are told. It is important to have someone help care for you until you are awake and alert.  Rest as told. Do not drive or operate machinery. Do not drink alcohol or take sleeping pills.  Get help right away if you have trouble breathing, or if you suddenly become confused. This information is not intended to replace advice given to you by your health care provider. Make sure you discuss any questions you have with your health care provider. Document Revised: 11/19/2019 Document Reviewed: 02/05/2019 Elsevier Patient Education  2021 Elsevier Inc.  

## 2020-07-18 DIAGNOSIS — I5023 Acute on chronic systolic (congestive) heart failure: Secondary | ICD-10-CM | POA: Diagnosis not present

## 2020-07-19 LAB — BASIC METABOLIC PANEL
BUN/Creatinine Ratio: 15 (ref 12–28)
BUN: 22 mg/dL (ref 8–27)
CO2: 19 mmol/L — ABNORMAL LOW (ref 20–29)
Calcium: 9.6 mg/dL (ref 8.7–10.3)
Chloride: 104 mmol/L (ref 96–106)
Creatinine, Ser: 1.47 mg/dL — ABNORMAL HIGH (ref 0.57–1.00)
Glucose: 146 mg/dL — ABNORMAL HIGH (ref 65–99)
Potassium: 4.4 mmol/L (ref 3.5–5.2)
Sodium: 142 mmol/L (ref 134–144)
eGFR: 36 mL/min/{1.73_m2} — ABNORMAL LOW (ref >59)

## 2020-08-03 ENCOUNTER — Other Ambulatory Visit: Payer: Self-pay | Admitting: *Deleted

## 2020-08-03 ENCOUNTER — Telehealth: Payer: Self-pay | Admitting: *Deleted

## 2020-08-03 MED ORDER — METOPROLOL SUCCINATE ER 100 MG PO TB24: 100.0000 mg | ORAL_TABLET | Freq: Every day | ORAL | 1 refills | Status: DC

## 2020-08-03 MED ORDER — ENTRESTO 24-26 MG PO TABS: 1.0000 | ORAL_TABLET | Freq: Two times a day (BID) | ORAL | 3 refills | Status: DC

## 2020-08-03 MED ORDER — DAPAGLIFLOZIN PROPANEDIOL 10 MG PO TABS: 10.0000 mg | ORAL_TABLET | Freq: Every day | ORAL | 3 refills | Status: DC

## 2020-08-03 MED ORDER — POTASSIUM CHLORIDE ER 10 MEQ PO TBCR: 10.0000 meq | EXTENDED_RELEASE_TABLET | Freq: Every day | ORAL | 1 refills | Status: DC

## 2020-08-03 MED ORDER — FUROSEMIDE 20 MG PO TABS: 20.0000 mg | ORAL_TABLET | Freq: Every day | ORAL | 3 refills | Status: DC

## 2020-08-03 MED ORDER — ELIQUIS 5 MG PO TABS: 1.0000 | ORAL_TABLET | Freq: Two times a day (BID) | ORAL | 6 refills | Status: DC

## 2020-08-03 MED ORDER — CLOPIDOGREL BISULFATE 75 MG PO TABS: 75.0000 mg | ORAL_TABLET | Freq: Every day | ORAL | 1 refills | Status: DC

## 2020-08-03 MED ORDER — LEVOTHYROXINE SODIUM 25 MCG PO TABS: 25.0000 ug | ORAL_TABLET | Freq: Every day | ORAL | 1 refills | Status: DC

## 2020-08-03 MED ORDER — POTASSIUM CHLORIDE ER 10 MEQ PO TBCR: 10.0000 meq | EXTENDED_RELEASE_TABLET | Freq: Every day | ORAL | 3 refills | Status: DC

## 2020-08-03 MED ORDER — LEVOTHYROXINE SODIUM 25 MCG PO TABS: 25.0000 ug | ORAL_TABLET | Freq: Every day | ORAL | 3 refills | Status: DC

## 2020-08-03 MED ORDER — CLOPIDOGREL BISULFATE 75 MG PO TABS: 75.0000 mg | ORAL_TABLET | Freq: Every day | ORAL | 2 refills | Status: DC

## 2020-08-03 MED ORDER — ENTRESTO 24-26 MG PO TABS: 1.0000 | ORAL_TABLET | Freq: Two times a day (BID) | ORAL | 1 refills | Status: DC

## 2020-08-03 NOTE — Telephone Encounter (Signed)
Prescription refill request for Eliquis received. Indication: Atrial Fib Last office visit: 07/06/20 Dr Mendel Ryder Scr: 1.47 on 07/18/20 Age: 80 Weight: 86.8kg  Based on above findings Eliquis 5mg  twice daily is the appropriate dose.  Refill approved.

## 2020-08-03 NOTE — Telephone Encounter (Signed)
Patient's pharmacy is requesting refill on Digoxin tablets. Per Dr. Michaelle Copas note AFib clinic to prescribe. Please verify. Thank you.

## 2020-08-03 NOTE — Telephone Encounter (Signed)
Ok to fill 

## 2020-08-04 ENCOUNTER — Other Ambulatory Visit: Payer: Self-pay | Admitting: *Deleted

## 2020-08-04 MED ORDER — FUROSEMIDE 20 MG PO TABS: 20.0000 mg | ORAL_TABLET | Freq: Every day | ORAL | 3 refills | Status: DC

## 2020-08-04 MED ORDER — DIGOXIN 125 MCG PO TABS: 0.1250 mg | ORAL_TABLET | ORAL | 3 refills | Status: DC

## 2020-08-04 NOTE — Telephone Encounter (Signed)
Pt's medication was sent to pt's pharmacy as requested. Confirmation received.  °

## 2020-08-04 NOTE — Addendum Note (Signed)
Addended by: Margaret Pyle D on: 08/04/2020 08:08 AM   Modules accepted: Orders

## 2020-08-05 ENCOUNTER — Other Ambulatory Visit: Payer: Self-pay

## 2020-08-05 MED ORDER — ELIQUIS 5 MG PO TABS: 1.0000 | ORAL_TABLET | Freq: Two times a day (BID) | ORAL | 1 refills | Status: DC

## 2020-08-05 NOTE — Telephone Encounter (Signed)
Prescription refill request for Eliquis received from [facility] on 08/05/20.   Indication: Atrial Fib Last office visit: 07/06/20 Dr Mendel Ryder Scr: 1.47 on 07/18/20 Age: 80 Weight: 86.8kg  Based on above findings Eliquis 5mg  twice daily is the appropriate dose.  Refill approved. Refill was sent to [facility] in [place] on 08/03/20.  Will send refill to mail order pharmacy.

## 2020-08-10 ENCOUNTER — Other Ambulatory Visit: Payer: Self-pay

## 2020-08-10 MED ORDER — LEVOTHYROXINE SODIUM 25 MCG PO TABS: 25.0000 ug | ORAL_TABLET | Freq: Every day | ORAL | 3 refills | Status: DC

## 2020-08-10 MED ORDER — DIGOXIN 125 MCG PO TABS: 0.1250 mg | ORAL_TABLET | ORAL | 3 refills | Status: DC

## 2020-08-10 NOTE — Telephone Encounter (Signed)
[facility] pharmacy is requesting a refill on levothyroxine. Would Dr Katrinka Blazing like to refill this medication? Please address

## 2020-08-10 NOTE — Telephone Encounter (Signed)
Ok to fill per previous refill request.  Looks like it was sent in on 5/18.

## 2020-10-04 NOTE — Progress Notes (Signed)
Cardiology Office Note:    Date:  10/05/2020   ID:  Terry Hanson, DOB Nov 22, 1940, MRN [id number]  PCP:  [name], MD  Cardiologist:  Lesleigh Noe, MD   Referring MD: [name], MD   Chief Complaint  Patient presents with   Coronary Artery Disease   Atrial Fibrillation   Congestive Heart Failure    History of Present Illness:    Terry Hanson is a 80 y.o. female with a hx of anterior myocardial infarction April 2021 with PCI of the proximal LAD - on Plavix solely, ischemic cardiomyopathy EF 25% 03/2020 (discharged with LifeVest), chronic stage III kidney disease, diabetes mellitus II, hyperlipidemia, recently placed on amiodarone and apixaban for atrial fibrillation and ventricular tachycardia noted on LifeVest admitted 5/10 from the office with progressive weakness, nausea/vomiting and diarrhea that resolved after amiodarone stopped. COVID - 19 with respiratory involvement 03/19/2020.   She gets short of breath if she moves around too fast.  She denies PND and lower extremity swelling.  She does correlate not taking Lasix with having shortness of breath.  She was hospitalized earlier this year with acute fluid overload when Lasix therapy was not refilled properly by the pharmacy.  Does not sleep well.  She feels that one of the medications she takes in the evening keeps her up.  This could be Toprol-XL.  We discussed switching over to carvedilol but she did not want to rock the boat.  Her difficulty sleeping has not a major problem according to her.  Past Medical History:  Diagnosis Date   Arthritis    Asthma    CHF (congestive heart failure) (HCC)    Diabetes mellitus (HCC)    Diabetes mellitus without complication (HCC)    Enlarged heart    Hypercholesterolemia    Hyperlipidemia    Ischemic cardiomyopathy    a. EF 30-35% by echo in 06/2019   STEMI (ST elevation myocardial infarction) (HCC)    a. 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    Past  Surgical History:  Procedure Laterality Date   ABDOMINAL HYSTERECTOMY     CATARACT EXTRACTION W/PHACO Right 06/27/2020   Procedure: CATARACT EXTRACTION PHACO AND INTRAOCULAR LENS PLACEMENT RIGHT EYE AND PLACEMENT OF CORTICOSTEROID;  Surgeon: Fabio Pierce, MD;  Location: [facility];  Service: Ophthalmology;  Laterality: Right;  right CDE=25.47   CORONARY/GRAFT ACUTE MI REVASCULARIZATION N/A 06/29/2019   Procedure: Coronary/Graft Acute MI Revascularization;  Surgeon: Lyn Records, MD;  Location: [facility] INVASIVE CV LAB;  Service: Cardiovascular;  Laterality: N/A;   ICD IMPLANT N/A 12/14/2019   Procedure: ICD IMPLANT;  Surgeon: Marinus Maw, MD;  Location: [facility] INVASIVE CV LAB;  Service: Cardiovascular;  Laterality: N/A;   LEFT HEART CATH AND CORONARY ANGIOGRAPHY N/A 06/29/2019   Procedure: LEFT HEART CATH AND CORONARY ANGIOGRAPHY;  Surgeon: Lyn Records, MD;  Location: [facility] INVASIVE CV LAB;  Service: Cardiovascular;  Laterality: N/A;    Current Medications: Current Meds  Medication Sig   clopidogrel (PLAVIX) 75 MG tablet Take 1 tablet (75 mg total) by mouth daily before breakfast.   dapagliflozin propanediol (FARXIGA) 10 MG TABS tablet Take 1 tablet (10 mg total) by mouth daily before breakfast.   digoxin (LANOXIN) 0.125 MG tablet Take 1 tablet (0.125 mg total) by mouth 3 (three) times a week. Taking Monday, Wednesday, and Friday.   furosemide (LASIX) 20 MG tablet Take 1 tablet (20 mg total) by mouth daily. May take an additional 20mg  once daily  for 2-3 pound weight gain over night, shortness of breath or edema.   levothyroxine (SYNTHROID) 25 MCG tablet Take 1 tablet (25 mcg total) by mouth daily before breakfast.   metoprolol succinate (TOPROL-XL) 100 MG 24 hr tablet Take 1 tablet (100 mg total) by mouth daily.   nitroGLYCERIN (NITROSTAT) 0.4 MG SL tablet Place 1 tablet (0.4 mg total) under the tongue every 5 (five) minutes as needed.   nystatin (MYCOSTATIN/NYSTOP) powder Apply topically 2 (two) times  daily.   potassium chloride (KLOR-CON) 10 MEQ tablet Take 1 tablet (10 mEq total) by mouth daily. May take 20 mg for 2-3 pound weight gain overnight, shortness of breath, edema   sacubitril-valsartan (ENTRESTO) 24-26 MG Take 1 tablet by mouth 2 (two) times daily.   vitamin B-12 (CYANOCOBALAMIN) 1000 MCG tablet Take 1,000 mcg by mouth daily.     Allergies:   Codeine, Shrimp [shellfish allergy], Penicillins, and Sulfa antibiotics   Social History   Socioeconomic History   Marital status: Married    Spouse name: Not on file   Number of children: Not on file   Years of education: Not on file   Highest education level: Not on file  Occupational History   Not on file  Tobacco Use   Smoking status: Never   Smokeless tobacco: Former    Types: Associate Professor Use: Never used  Substance and Sexual Activity   Alcohol use: Not Currently   Drug use: Never   Sexual activity: Not on file  Other Topics Concern   Not on file  Social History Narrative   ** Merged History Encounter **       Social Determinants of Health   Financial Resource Strain: Not on file  Food Insecurity: Not on file  Transportation Needs: Not on file  Physical Activity: Not on file  Stress: Not on file  Social Connections: Not on file     Family History: The patient's family history includes Heart disease in her mother.  ROS:   Please see the history of present illness.    Appetite is stable.  She understands her medication regimen is taking the medicine as prescribed.  Her most recent hemoglobin and creatinine were 9.7 and 1.47.  All other systems reviewed and are negative.  EKGs/Labs/Other Studies Reviewed:    The following studies were reviewed today:  Laboratory data: Creatinine 1.47 May 2022 Hemoglobin 9.25 March 2020 LDL cholesterol 50 September 2021 Potassium 4.20 Jul 2020  2D Doppler echocardiogram January 2022 IMPRESSIONS     1. Akinesis of the anterior, septal and apical walls  with overall severe  LV dysfunction; no apical thrombus noted using definity.   2. Left ventricular ejection fraction, by estimation, is 25 to 30%. The  left ventricle has severely decreased function. The left ventricle  demonstrates regional wall motion abnormalities (see scoring  diagram/findings for description). Left ventricular  diastolic function could not be evaluated.   3. Right ventricular systolic function is normal. The right ventricular  size is normal. There is normal pulmonary artery systolic pressure.   4. The mitral valve is normal in structure. Mild mitral valve  regurgitation. No evidence of mitral stenosis.   5. The aortic valve has an indeterminant number of cusps. Aortic valve  regurgitation is not visualized. No aortic stenosis is present.   6. The inferior vena cava is normal in size with greater than 50%  respiratory variability, suggesting right atrial pressure of 3 mmHg.   EKG:  EKG atrial fibrillation with rate control was noted.  Poor R wave progression was noted.  Recent Labs: 03/19/2020: B Natriuretic Peptide 1,686.0 03/21/2020: ALT 11; Hemoglobin 9.7; Magnesium 2.1; Platelets 164 07/18/2020: BUN 22; Creatinine, Ser 1.47; Potassium 4.4; Sodium 142  Recent Lipid Panel    Component Value Date/Time   CHOL 106 12/02/2019 1134   TRIG 136 03/19/2020 2220   HDL 31 (L) 12/02/2019 1134   CHOLHDL 5.2 06/29/2019 2218   VLDL 23 06/29/2019 2218   LDLCALC 50 12/02/2019 1134    Physical Exam:    VS:  BP 112/70   Pulse 90   Ht 5\' 2"  (1.575 m)   Wt 189 lb (85.7 kg)   SpO2 97%   BMI 34.57 kg/m     Wt Readings from Last 3 Encounters:  10/05/20 189 lb (85.7 kg)  07/11/20 191 lb 6.4 oz (86.8 kg)  07/06/20 191 lb 6.4 oz (86.8 kg)     GEN: Obesity. No acute distress HEENT: Normal NECK: No JVD. LYMPHATICS: No lymphadenopathy CARDIAC: No murmur.  Irregularly irregular RR no gallop, or edema. VASCULAR:  Normal Pulses. No bruits. RESPIRATORY:  Clear to auscultation  without rales, wheezing or rhonchi  ABDOMEN: Soft, non-tender, non-distended, No pulsatile mass, MUSCULOSKELETAL: No deformity  SKIN: Warm and dry NEUROLOGIC:  Alert and oriented x 3 PSYCHIATRIC:  Normal affect   ASSESSMENT:    1. Acute ST elevation myocardial infarction (STEMI) due to occlusion of left anterior descending (LAD) coronary artery (HCC)   2. Coronary artery disease involving native coronary artery of native heart without angina pectoris   3. Acute systolic heart failure (HCC)   4. Hypercholesterolemia   5. Ischemic cardiomyopathy   6. Atrial fibrillation with RVR (HCC)    PLAN:    In order of problems listed above:  Resolved Continue Plavix.  We are now 1 year beyond stent implantation.  Not have any bleeding issues and therefore we will continue Plavix therapy along with Eliquis. Acute heart failure is resolved.  Echo will be done prior to the next office visit in 6 months. Continue to monitor lipids.  Not currently on statin therapy.  Lipid panel will be done on next office visit. Ischemic.  Follow-up echo done will be done in 6 months. Persistent with rate control on relatively high-dose metoprolol and Lanoxin.  We will check a dig level on return.  Also needs a lipid panel on return.  Guideline directed therapy for left ventricular systolic dysfunction: Angiotensin receptor-neprilysin inhibitor (ARNI)-Entresto; beta-blocker therapy - carvedilol, metoprolol succinate, or bisoprolol; mineralocorticoid receptor antagonist (MRA) therapy -spironolactone or eplerenone.  SGLT-2 agents -  Dapagliflozin 07/08/20) or Empagliflozin (Jardiance).These therapies have been shown to improve clinical outcomes including reduction of rehospitalization, survival, and acute heart failure.    Medication Adjustments/Labs and Tests Ordered: Current medicines are reviewed at length with the patient today.  Concerns regarding medicines are outlined above.  No orders of the defined types were  placed in this encounter.  No orders of the defined types were placed in this encounter.   There are no Patient Instructions on file for this visit.   Signed, [name], MD  10/05/2020 2:21 PM    [facility]

## 2020-10-05 ENCOUNTER — Ambulatory Visit (INDEPENDENT_AMBULATORY_CARE_PROVIDER_SITE_OTHER): Payer: Medicare Other | Admitting: Interventional Cardiology

## 2020-10-05 ENCOUNTER — Other Ambulatory Visit: Payer: Self-pay

## 2020-10-05 ENCOUNTER — Encounter: Payer: Self-pay | Admitting: Interventional Cardiology

## 2020-10-05 VITALS — BP 112/70 | HR 90 | Ht 62.0 in | Wt 189.0 lb

## 2020-10-05 DIAGNOSIS — I5021 Acute systolic (congestive) heart failure: Secondary | ICD-10-CM

## 2020-10-05 DIAGNOSIS — I2510 Atherosclerotic heart disease of native coronary artery without angina pectoris: Secondary | ICD-10-CM | POA: Diagnosis not present

## 2020-10-05 DIAGNOSIS — I2102 ST elevation (STEMI) myocardial infarction involving left anterior descending coronary artery: Secondary | ICD-10-CM | POA: Diagnosis not present

## 2020-10-05 DIAGNOSIS — I255 Ischemic cardiomyopathy: Secondary | ICD-10-CM

## 2020-10-05 DIAGNOSIS — I4891 Unspecified atrial fibrillation: Secondary | ICD-10-CM | POA: Diagnosis not present

## 2020-10-05 DIAGNOSIS — E7800 Pure hypercholesterolemia, unspecified: Secondary | ICD-10-CM

## 2020-10-05 NOTE — Patient Instructions (Addendum)
Medication Instructions:  Your physician recommends that you continue on your current medications as directed. Please refer to the Current Medication list given to you today.  *If you need a refill on your cardiac medications before your next appointment, please call your pharmacy*   Lab Work: BMET, Lipid, Liver, CBC, Digoxin and Pro BNP same day as echo  If you have labs (blood work) drawn today and your tests are completely normal, you will receive your results only by: MyChart Message (if you have MyChart) OR A paper copy in the mail If you have any lab test that is abnormal or we need to change your treatment, we will call you to review the results.   Testing/Procedures: Your physician has requested that you have an echocardiogram 1 week prior to seeing Dr. Katrinka Blazing back in 6 months. Echocardiography is a painless test that uses sound waves to create images of your heart. It provides your doctor with information about the size and shape of your heart and how well your heart's chambers and valves are working. This procedure takes approximately one hour. There are no restrictions for this procedure.    Follow-Up: At [facility], you and your health needs are our priority.  As part of our continuing mission to provide you with exceptional heart care, we have created designated Provider Care Teams.  These Care Teams include your primary Cardiologist (physician) and Advanced Practice Providers (APPs -  Physician Assistants and Nurse Practitioners) who all work together to provide you with the care you need, when you need it.  We recommend signing up for the patient portal called "MyChart".  Sign up information is provided on this After Visit Summary.  MyChart is used to connect with patients for Virtual Visits (Telemedicine).  Patients are able to view lab/test results, encounter notes, upcoming appointments, etc.  Non-urgent messages can be sent to your provider as well.   To learn more about  what you can do with MyChart, go to [email or web address].    Your next appointment:   6 month(s)  The format for your next appointment:   In Person  Provider:   You may see Lesleigh Noe, MD or one of the following Advanced Practice Providers on your designated Care Team:   Nada Boozer, NP    Other Instructions

## 2020-12-19 ENCOUNTER — Ambulatory Visit (INDEPENDENT_AMBULATORY_CARE_PROVIDER_SITE_OTHER): Payer: Medicare Other

## 2020-12-19 DIAGNOSIS — I255 Ischemic cardiomyopathy: Secondary | ICD-10-CM | POA: Diagnosis not present

## 2020-12-20 LAB — CUP PACEART REMOTE DEVICE CHECK
Battery Remaining Longevity: 109 mo
Battery Remaining Percentage: 89 %
Battery Voltage: 3.01 V
Brady Statistic RV Percent Paced: 1 %
Date Time Interrogation Session: 20221004020054
HighPow Impedance: 68 Ohm
Implantable Lead Implant Date: 20210927
Implantable Lead Location: 753860
Implantable Pulse Generator Implant Date: 20210927
Lead Channel Impedance Value: 410 Ohm
Lead Channel Pacing Threshold Amplitude: 0.75 V
Lead Channel Pacing Threshold Pulse Width: 0.5 ms
Lead Channel Sensing Intrinsic Amplitude: 11.6 mV
Lead Channel Setting Pacing Amplitude: 2.5 V
Lead Channel Setting Pacing Pulse Width: 0.5 ms
Lead Channel Setting Sensing Sensitivity: 0.5 mV
Pulse Gen Serial Number: 810006603

## 2020-12-22 ENCOUNTER — Ambulatory Visit (HOSPITAL_COMMUNITY)
Admission: RE | Admit: 2020-12-22 | Discharge: 2020-12-22 | Disposition: A | Discharge status: Home / Self Care | Payer: Medicare Other | Source: Ambulatory Visit | Attending: Cardiology | Admitting: Cardiology

## 2020-12-22 ENCOUNTER — Other Ambulatory Visit: Payer: Self-pay

## 2020-12-22 VITALS — BP 92/50 | HR 64 | Wt 190.8 lb

## 2020-12-22 DIAGNOSIS — I255 Ischemic cardiomyopathy: Secondary | ICD-10-CM

## 2020-12-22 DIAGNOSIS — I252 Old myocardial infarction: Secondary | ICD-10-CM | POA: Diagnosis not present

## 2020-12-22 DIAGNOSIS — Z7901 Long term (current) use of anticoagulants: Secondary | ICD-10-CM | POA: Diagnosis not present

## 2020-12-22 DIAGNOSIS — I4819 Other persistent atrial fibrillation: Secondary | ICD-10-CM | POA: Diagnosis not present

## 2020-12-22 DIAGNOSIS — I2510 Atherosclerotic heart disease of native coronary artery without angina pectoris: Secondary | ICD-10-CM | POA: Insufficient documentation

## 2020-12-22 DIAGNOSIS — Z8249 Family history of ischemic heart disease and other diseases of the circulatory system: Secondary | ICD-10-CM | POA: Diagnosis not present

## 2020-12-22 DIAGNOSIS — N183 Chronic kidney disease, stage 3 unspecified: Secondary | ICD-10-CM | POA: Insufficient documentation

## 2020-12-22 DIAGNOSIS — Z7902 Long term (current) use of antithrombotics/antiplatelets: Secondary | ICD-10-CM | POA: Diagnosis not present

## 2020-12-22 DIAGNOSIS — Z7984 Long term (current) use of oral hypoglycemic drugs: Secondary | ICD-10-CM | POA: Insufficient documentation

## 2020-12-22 DIAGNOSIS — Z8616 Personal history of COVID-19: Secondary | ICD-10-CM | POA: Diagnosis not present

## 2020-12-22 DIAGNOSIS — I482 Chronic atrial fibrillation, unspecified: Secondary | ICD-10-CM | POA: Diagnosis not present

## 2020-12-22 DIAGNOSIS — I5022 Chronic systolic (congestive) heart failure: Secondary | ICD-10-CM | POA: Diagnosis not present

## 2020-12-22 LAB — BASIC METABOLIC PANEL
Anion gap: 6 (ref 5–15)
BUN: 24 mg/dL — ABNORMAL HIGH (ref 8–23)
CO2: 28 mmol/L (ref 22–32)
Calcium: 9.6 mg/dL (ref 8.9–10.3)
Chloride: 105 mmol/L (ref 98–111)
Creatinine, Ser: 1.93 mg/dL — ABNORMAL HIGH (ref 0.44–1.00)
GFR, Estimated: 26 mL/min — ABNORMAL LOW (ref >60)
Glucose, Bld: 135 mg/dL — ABNORMAL HIGH (ref 70–99)
Potassium: 4.6 mmol/L (ref 3.5–5.1)
Sodium: 139 mmol/L (ref 135–145)

## 2020-12-22 LAB — DIGOXIN LEVEL: Digoxin Level: 0.2 ng/mL — ABNORMAL LOW (ref 0.8–2.0)

## 2020-12-22 LAB — BRAIN NATRIURETIC PEPTIDE: B Natriuretic Peptide: 803.8 pg/mL — ABNORMAL HIGH (ref 0.0–100.0)

## 2020-12-22 MED ORDER — SIMVASTATIN 20 MG PO TABS: 20.0000 mg | ORAL_TABLET | Freq: Every evening | ORAL | 3 refills | Status: DC

## 2020-12-22 MED ORDER — FUROSEMIDE 20 MG PO TABS: 20.0000 mg | ORAL_TABLET | Freq: Every day | ORAL | 3 refills | Status: DC

## 2020-12-22 MED ORDER — FUROSEMIDE 20 MG PO TABS: 20.0000 mg | ORAL_TABLET | Freq: Two times a day (BID) | ORAL | 3 refills | Status: DC

## 2020-12-22 MED ORDER — METOPROLOL SUCCINATE ER 50 MG PO TB24: 50.0000 mg | ORAL_TABLET | Freq: Every day | ORAL | 3 refills | Status: DC

## 2020-12-22 MED ORDER — SPIRONOLACTONE 25 MG PO TABS: 12.5000 mg | ORAL_TABLET | Freq: Every day | ORAL | 3 refills | Status: DC

## 2020-12-22 NOTE — Patient Instructions (Addendum)
DECREASE Toprol XL to 50 mg, one tab nightly at bedtime START Spironolactone 12.5 mg, one half tab nightly at bedtime START ZOCOR 20 MG, one tab daily CONTINUE Lasix 20 mg, one tab daily STOP Aspirin   Labs today We will only contact you if something comes back abnormal or we need to make some changes. Otherwise no news is good news!  Your physician recommends that you schedule a follow-up appointment in: 3-4 weeks with Dr Shirlee Latch  Do the following things EVERYDAY: Weigh yourself in the morning before breakfast. Write it down and keep it in a log. Take your medicines as prescribed Eat low salt foods--Limit salt (sodium) to 2000 mg per day.  Stay as active as you can everyday Limit all fluids for the day to less than 2 liters  At [facility], you and your health needs are our priority. As part of our continuing mission to provide you with exceptional heart care, we have created designated Provider Care Teams. These Care Teams include your primary Cardiologist (physician) and Advanced Practice Providers (APPs- Physician Assistants and Nurse Practitioners) who all work together to provide you with the care you need, when you need it.   You may see any of the following providers on your designated Care Team at your next follow up: Dr Arvilla Meres Dr Marca Ancona Dr Brandon Melnick, NP Robbie Lis, [place] Mikki Santee Karle Plumber, PharmD   Please be sure to bring in all your medications bottles to every appointment.       You are scheduled for a Cardiac Catheterization on Friday, October 14 with Dr. Marca Ancona.  1. Please arrive at the [facility] (Main Entrance A) at [facility]: [address] [place], [place] [address] at 10:00 AM (This time is two hours before your procedure to ensure your preparation). Free valet parking service is available.   Special note: Every effort is made to have your procedure done on time. Please  understand that emergencies sometimes delay scheduled procedures.  2. Diet: Do not eat solid foods after midnight.  The patient may have clear liquids until 5am upon the day of the procedure.  3. Labs: pre procedure labs done 12/22/20  4. Medication instructions in preparation for your procedure:   Contrast Allergy: No   Stop taking Eliquis (Apixiban) on Thursday, October 13. And Friday, October 14.  Stop taking, Lasix (Furosemide)  Friday, October 14,    On the morning of your procedure, take your Plavix/Clopidogrel and any morning medicines NOT listed above.  You may use sips of water.  5. Plan for one night stay--bring personal belongings. 6. Bring a current list of your medications and current insurance cards. 7. You MUST have a responsible person to drive you home. 8. Someone MUST be with you the first 24 hours after you arrive home or your discharge will be delayed. 9. Please wear clothes that are easy to get on and off and wear slip-on shoes.  Thank you for allowing [place] to care for you!   -- [facility]

## 2020-12-25 NOTE — H&P (View-Only) (Signed)
PCP: [name], MD Cardiology: Dr. Katrinka Blazing HF Cardiology: Dr. Shirlee Latch  80 y.o. with history of CAD, chronic systolic CHF, CKD stage 3 and atrial fibrillation was referred by Dr. Katrinka Blazing for evaluation of CHF.  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  She developed ischemic cardiomyopathy, last echo in 1/22 showed EF 25-30%.  She now has a Secondary school teacher ICD. Patient also has persistent atrial fibrillation.  She was unable to take amiodarone in the past due to nausea/vomiting.    Patient reports poor energy/fatigue after taking her medications in the morning.  BP mildly low, 92/50, in the office today.  She says that her BP runs in the 120 range when she checks at home but lower in doctors' offices.  She has has never brought her cuff in to be calibrated.  She does report some lightheadedness with standing.  She is short of breath after walking about 200 feet or walking up stairs.  No chest pain.  She has chronic 3-pillow orthopnea.  She remains in atrial fibrillation today. She is not on a statin, says she has had myalgias with atorvastatin and Crestor but can tolerate simvastatin.   Labs (5/22): K 4.4, creatinine 1.47  REDS clip 30%  ECG: atrial fibrillation, poor RWP, QTc 422 msec  PMH: 1. CAD: Anterior MI in 4/21.  LHC with 99% pLAD, 70% pRCA, 80%, OM1, 60% mLCx.  Patient had PCI to pLAD.   2. Chronic systolic CHF: Ischemic cardiomyopathy.  St Jude ICD.  - Echo (1/22): EF 25-30%, RV normal.  3. CKD stage 3 4. Type 2 diabetes 5. Hyperlipidemia: Myalgias with Crestor and Lipitor.  6. Atrial fibrillation: Persistent.  - Nausea/vomiting with amiodarone.  7. H/o VT 8. COVID-19 in 1/22  Social History   Socioeconomic History   Marital status: Married    Spouse name: Not on file   Number of children: Not on file   Years of education: Not on file   Highest education level: Not on file  Occupational History   Not on file  Tobacco Use   Smoking status: Never    Smokeless tobacco: Former    Types: Associate Professor Use: Never used  Substance and Sexual Activity   Alcohol use: Not Currently   Drug use: Never   Sexual activity: Not on file  Other Topics Concern   Not on file  Social History Narrative   ** Merged History Encounter **       Social Determinants of Health   Financial Resource Strain: Not on file  Food Insecurity: Not on file  Transportation Needs: Not on file  Physical Activity: Not on file  Stress: Not on file  Social Connections: Not on file  Intimate Partner Violence: Not on file   Family History  Problem Relation Age of Onset   Heart disease Mother    ROS: All systems reviewed and negative except as per HPI.   Current Outpatient Medications  Medication Sig Dispense Refill   apixaban (ELIQUIS) 5 MG TABS tablet Take 5 mg by mouth 2 (two) times daily.     clopidogrel (PLAVIX) 75 MG tablet Take 1 tablet (75 mg total) by mouth daily before breakfast. 90 tablet 1   dapagliflozin propanediol (FARXIGA) 10 MG TABS tablet Take 1 tablet (10 mg total) by mouth daily before breakfast. 30 tablet 3   digoxin (LANOXIN) 0.125 MG tablet Take 1 tablet (0.125 mg total) by mouth 3 (three) times a  week. Taking Monday, Wednesday, and Friday. 45 tablet 3   levothyroxine (SYNTHROID) 25 MCG tablet Take 1 tablet (25 mcg total) by mouth daily before breakfast. 90 tablet 3   nitroGLYCERIN (NITROSTAT) 0.4 MG SL tablet Place 1 tablet (0.4 mg total) under the tongue every 5 (five) minutes as needed. 25 tablet 2   nystatin (MYCOSTATIN/NYSTOP) powder Apply topically 2 (two) times daily. 15 g 0   potassium chloride (KLOR-CON) 10 MEQ tablet Take 1 tablet (10 mEq total) by mouth daily. May take 20 mg for 2-3 pound weight gain overnight, shortness of breath, edema 120 tablet 1   sacubitril-valsartan (ENTRESTO) 24-26 MG Take 1 tablet by mouth 2 (two) times daily. 90 tablet 1   simvastatin (ZOCOR) 20 MG tablet Take 1 tablet (20 mg total) by mouth  every evening. 90 tablet 3   spironolactone (ALDACTONE) 25 MG tablet Take 0.5 tablets (12.5 mg total) by mouth at bedtime. 45 tablet 3   furosemide (LASIX) 20 MG tablet Take 1 tablet (20 mg total) by mouth daily. 90 tablet 3   metoprolol succinate (TOPROL-XL) 50 MG 24 hr tablet Take 1 tablet (50 mg total) by mouth at bedtime. 90 tablet 3   No current facility-administered medications for this encounter.   BP (!) 92/50   Pulse 64   Wt 86.5 kg (190 lb 12.8 oz)   SpO2 98%   BMI 34.90 kg/m  General: NAD Neck: JVP 8-9 cm, no thyromegaly or thyroid nodule.  Lungs: Clear to auscultation bilaterally with normal respiratory effort. CV: Nondisplaced PMI.  Heart irregular S1/S2, no S3/S4, no murmur.  No peripheral edema.  No carotid bruit.  Normal pedal pulses.  Abdomen: Soft, nontender, no hepatosplenomegaly, no distention.  Skin: Intact without lesions or rashes.  Neurologic: Alert and oriented x 3.  Psych: Normal affect. Extremities: No clubbing or cyanosis.  HEENT: Normal.   1. CAD: Patient has history of anterior MI in 4/21 with PCI to the proximal LAD.  No chest pain.  - She is on apixaban, Plavix, and ASA.  Dr. Katrinka Blazing has wanted her to continue Plavix.  I asked her to stop ASA.  - She needs a statin, cannot take atorvastatin or rosuvastatin due to myalgias but says that she does fine with simvastatin.  I will have her start simvastatin 20 mg daily with lipids/LFTs in 2 months.  2. Chronic systolic CHF: Ischemic cardiomyopathy with St Jude ICD.  Last echo in 1/22 with EF 25-30%.  Patient is quite symptomatic, NYHA class III with fatigue and dyspnea.  However, she is not significantly volume overloaded on exam and her REDS clip reading is normal at 30%.  Symptoms are certainly out of proportion to exam.  - I recommended that we do a RHC to formally assess filling pressures and cardiac output.  I discussed risks/benefits and she agreed to procedure.  - It is possible that low BP contributes to  her symptoms.  I asked her to decrease Toprol XL to 50 mg daily, take at bedtime.  - Continue Entresto 24/26 bid.   - Continue digoxin, check level today.  - Add spironolactone 12.5 mg qhs with BMET/BNP today and BMET in 10 days.  This should not affect her BP much.  - Continue dapagliflozin.   - Narrow QRS, not CRT candidate.  3. Atrial fibrillation: Persistent.  She did not tolerate amiodarone.  AF in the setting of chronic systolic CHF may certainly be contributing to her symptoms.  I think that she merits an  attempt at return to NSR.  QTc is not prolonged.  - I will refer to atrial fibrillation clinic for dofetilide initiation.  Would also consider atrial fibrillation ablation in the long-term based on CASTLE-HF data.  - Continue Eliquis.  4. CKD: Stage 3.  Follow creatinine closely.   Marca Ancona 12/25/2020

## 2020-12-25 NOTE — Progress Notes (Signed)
PCP: Dondiego, Richard, MD Cardiology: Dr. Smith HF Cardiology: Dr. Chrishonda Hesch  80 y.o. with history of CAD, chronic systolic CHF, CKD stage 3 and atrial fibrillation was referred by Dr. Smith for evaluation of CHF.  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  She developed ischemic cardiomyopathy, last echo in 1/22 showed EF 25-30%.  She now has a St Jude ICD. Patient also has persistent atrial fibrillation.  She was unable to take amiodarone in the past due to nausea/vomiting.    Patient reports poor energy/fatigue after taking her medications in the morning.  BP mildly low, 92/50, in the office today.  She says that her BP runs in the 120 range when she checks at home but lower in doctors' offices.  She has has never brought her cuff in to be calibrated.  She does report some lightheadedness with standing.  She is short of breath after walking about 200 feet or walking up stairs.  No chest pain.  She has chronic 3-pillow orthopnea.  She remains in atrial fibrillation today. She is not on a statin, says she has had myalgias with atorvastatin and Crestor but can tolerate simvastatin.   Labs (5/22): K 4.4, creatinine 1.47  REDS clip 30%  ECG: atrial fibrillation, poor RWP, QTc 422 msec  PMH: 1. CAD: Anterior MI in 4/21.  LHC with 99% pLAD, 70% pRCA, 80%, OM1, 60% mLCx.  Patient had PCI to pLAD.   2. Chronic systolic CHF: Ischemic cardiomyopathy.  St Jude ICD.  - Echo (1/22): EF 25-30%, RV normal.  3. CKD stage 3 4. Type 2 diabetes 5. Hyperlipidemia: Myalgias with Crestor and Lipitor.  6. Atrial fibrillation: Persistent.  - Nausea/vomiting with amiodarone.  7. H/o VT 8. COVID-19 in 1/22  Social History   Socioeconomic History   Marital status: Married    Spouse name: Not on file   Number of children: Not on file   Years of education: Not on file   Highest education level: Not on file  Occupational History   Not on file  Tobacco Use   Smoking status: Never    Smokeless tobacco: Former    Types: Chew  Vaping Use   Vaping Use: Never used  Substance and Sexual Activity   Alcohol use: Not Currently   Drug use: Never   Sexual activity: Not on file  Other Topics Concern   Not on file  Social History Narrative   ** Merged History Encounter **       Social Determinants of Health   Financial Resource Strain: Not on file  Food Insecurity: Not on file  Transportation Needs: Not on file  Physical Activity: Not on file  Stress: Not on file  Social Connections: Not on file  Intimate Partner Violence: Not on file   Family History  Problem Relation Age of Onset   Heart disease Mother    ROS: All systems reviewed and negative except as per HPI.   Current Outpatient Medications  Medication Sig Dispense Refill   apixaban (ELIQUIS) 5 MG TABS tablet Take 5 mg by mouth 2 (two) times daily.     clopidogrel (PLAVIX) 75 MG tablet Take 1 tablet (75 mg total) by mouth daily before breakfast. 90 tablet 1   dapagliflozin propanediol (FARXIGA) 10 MG TABS tablet Take 1 tablet (10 mg total) by mouth daily before breakfast. 30 tablet 3   digoxin (LANOXIN) 0.125 MG tablet Take 1 tablet (0.125 mg total) by mouth 3 (three) times a   week. Taking Monday, Wednesday, and Friday. 45 tablet 3   levothyroxine (SYNTHROID) 25 MCG tablet Take 1 tablet (25 mcg total) by mouth daily before breakfast. 90 tablet 3   nitroGLYCERIN (NITROSTAT) 0.4 MG SL tablet Place 1 tablet (0.4 mg total) under the tongue every 5 (five) minutes as needed. 25 tablet 2   nystatin (MYCOSTATIN/NYSTOP) powder Apply topically 2 (two) times daily. 15 g 0   potassium chloride (KLOR-CON) 10 MEQ tablet Take 1 tablet (10 mEq total) by mouth daily. May take 20 mg for 2-3 pound weight gain overnight, shortness of breath, edema 120 tablet 1   sacubitril-valsartan (ENTRESTO) 24-26 MG Take 1 tablet by mouth 2 (two) times daily. 90 tablet 1   simvastatin (ZOCOR) 20 MG tablet Take 1 tablet (20 mg total) by mouth  every evening. 90 tablet 3   spironolactone (ALDACTONE) 25 MG tablet Take 0.5 tablets (12.5 mg total) by mouth at bedtime. 45 tablet 3   furosemide (LASIX) 20 MG tablet Take 1 tablet (20 mg total) by mouth daily. 90 tablet 3   metoprolol succinate (TOPROL-XL) 50 MG 24 hr tablet Take 1 tablet (50 mg total) by mouth at bedtime. 90 tablet 3   No current facility-administered medications for this encounter.   BP (!) 92/50   Pulse 64   Wt 86.5 kg (190 lb 12.8 oz)   SpO2 98%   BMI 34.90 kg/m  General: NAD Neck: JVP 8-9 cm, no thyromegaly or thyroid nodule.  Lungs: Clear to auscultation bilaterally with normal respiratory effort. CV: Nondisplaced PMI.  Heart irregular S1/S2, no S3/S4, no murmur.  No peripheral edema.  No carotid bruit.  Normal pedal pulses.  Abdomen: Soft, nontender, no hepatosplenomegaly, no distention.  Skin: Intact without lesions or rashes.  Neurologic: Alert and oriented x 3.  Psych: Normal affect. Extremities: No clubbing or cyanosis.  HEENT: Normal.   1. CAD: Patient has history of anterior MI in 4/21 with PCI to the proximal LAD.  No chest pain.  - She is on apixaban, Plavix, and ASA.  Dr. Smith has wanted her to continue Plavix.  I asked her to stop ASA.  - She needs a statin, cannot take atorvastatin or rosuvastatin due to myalgias but says that she does fine with simvastatin.  I will have her start simvastatin 20 mg daily with lipids/LFTs in 2 months.  2. Chronic systolic CHF: Ischemic cardiomyopathy with St Jude ICD.  Last echo in 1/22 with EF 25-30%.  Patient is quite symptomatic, NYHA class III with fatigue and dyspnea.  However, she is not significantly volume overloaded on exam and her REDS clip reading is normal at 30%.  Symptoms are certainly out of proportion to exam.  - I recommended that we do a RHC to formally assess filling pressures and cardiac output.  I discussed risks/benefits and she agreed to procedure.  - It is possible that low BP contributes to  her symptoms.  I asked her to decrease Toprol XL to 50 mg daily, take at bedtime.  - Continue Entresto 24/26 bid.   - Continue digoxin, check level today.  - Add spironolactone 12.5 mg qhs with BMET/BNP today and BMET in 10 days.  This should not affect her BP much.  - Continue dapagliflozin.   - Narrow QRS, not CRT candidate.  3. Atrial fibrillation: Persistent.  She did not tolerate amiodarone.  AF in the setting of chronic systolic CHF may certainly be contributing to her symptoms.  I think that she merits an   attempt at return to NSR.  QTc is not prolonged.  - I will refer to atrial fibrillation clinic for dofetilide initiation.  Would also consider atrial fibrillation ablation in the long-term based on CASTLE-HF data.  - Continue Eliquis.  4. CKD: Stage 3.  Follow creatinine closely.   Marca Ancona 12/25/2020

## 2020-12-26 NOTE — Progress Notes (Signed)
Remote ICD transmission.   

## 2020-12-29 ENCOUNTER — Other Ambulatory Visit (HOSPITAL_COMMUNITY): Payer: Self-pay | Admitting: *Deleted

## 2020-12-29 DIAGNOSIS — I5022 Chronic systolic (congestive) heart failure: Secondary | ICD-10-CM

## 2020-12-30 ENCOUNTER — Other Ambulatory Visit: Payer: Self-pay

## 2020-12-30 ENCOUNTER — Encounter (HOSPITAL_COMMUNITY)
Admission: RE | Disposition: A | Discharge status: Home / Self Care | Payer: Self-pay | Source: Home / Self Care | Attending: Cardiology

## 2020-12-30 ENCOUNTER — Ambulatory Visit (HOSPITAL_COMMUNITY)
Admission: RE | Admit: 2020-12-30 | Discharge: 2020-12-30 | Disposition: A | Discharge status: Home / Self Care | Payer: Medicare Other | Attending: Cardiology | Admitting: Cardiology

## 2020-12-30 ENCOUNTER — Telehealth (HOSPITAL_COMMUNITY): Payer: Self-pay | Admitting: *Deleted

## 2020-12-30 DIAGNOSIS — Z87891 Personal history of nicotine dependence: Secondary | ICD-10-CM | POA: Insufficient documentation

## 2020-12-30 DIAGNOSIS — N183 Chronic kidney disease, stage 3 unspecified: Secondary | ICD-10-CM | POA: Insufficient documentation

## 2020-12-30 DIAGNOSIS — Z955 Presence of coronary angioplasty implant and graft: Secondary | ICD-10-CM | POA: Diagnosis not present

## 2020-12-30 DIAGNOSIS — E1122 Type 2 diabetes mellitus with diabetic chronic kidney disease: Secondary | ICD-10-CM | POA: Diagnosis not present

## 2020-12-30 DIAGNOSIS — I4819 Other persistent atrial fibrillation: Secondary | ICD-10-CM | POA: Diagnosis not present

## 2020-12-30 DIAGNOSIS — I509 Heart failure, unspecified: Secondary | ICD-10-CM | POA: Diagnosis not present

## 2020-12-30 DIAGNOSIS — I255 Ischemic cardiomyopathy: Secondary | ICD-10-CM | POA: Insufficient documentation

## 2020-12-30 DIAGNOSIS — I2510 Atherosclerotic heart disease of native coronary artery without angina pectoris: Secondary | ICD-10-CM | POA: Insufficient documentation

## 2020-12-30 DIAGNOSIS — I5022 Chronic systolic (congestive) heart failure: Secondary | ICD-10-CM | POA: Diagnosis not present

## 2020-12-30 DIAGNOSIS — Z8616 Personal history of COVID-19: Secondary | ICD-10-CM | POA: Diagnosis not present

## 2020-12-30 HISTORY — PX: RIGHT HEART CATH: CATH118263

## 2020-12-30 LAB — CBC
HCT: 38.1 % (ref 36.0–46.0)
Hemoglobin: 11.9 g/dL — ABNORMAL LOW (ref 12.0–15.0)
MCH: 30 pg (ref 26.0–34.0)
MCHC: 31.2 g/dL (ref 30.0–36.0)
MCV: 96 fL (ref 80.0–100.0)
Platelets: 216 10*3/uL (ref 150–400)
RBC: 3.97 MIL/uL (ref 3.87–5.11)
RDW: 13.8 % (ref 11.5–15.5)
WBC: 9.2 10*3/uL (ref 4.0–10.5)
nRBC: 0 % (ref 0.0–0.2)

## 2020-12-30 LAB — POCT I-STAT EG7
Acid-Base Excess: 0 mmol/L (ref 0.0–2.0)
Acid-base deficit: 1 mmol/L (ref 0.0–2.0)
Bicarbonate: 24.5 mmol/L (ref 20.0–28.0)
Bicarbonate: 24.8 mmol/L (ref 20.0–28.0)
Calcium, Ion: 1.2 mmol/L (ref 1.15–1.40)
Calcium, Ion: 1.25 mmol/L (ref 1.15–1.40)
HCT: 34 % — ABNORMAL LOW (ref 36.0–46.0)
HCT: 34 % — ABNORMAL LOW (ref 36.0–46.0)
Hemoglobin: 11.6 g/dL — ABNORMAL LOW (ref 12.0–15.0)
Hemoglobin: 11.6 g/dL — ABNORMAL LOW (ref 12.0–15.0)
O2 Saturation: 56 %
O2 Saturation: 61 %
Potassium: 4.2 mmol/L (ref 3.5–5.1)
Potassium: 4.4 mmol/L (ref 3.5–5.1)
Sodium: 138 mmol/L (ref 135–145)
Sodium: 138 mmol/L (ref 135–145)
TCO2: 26 mmol/L (ref 22–32)
TCO2: 26 mmol/L (ref 22–32)
pCO2, Ven: 41 mmHg — ABNORMAL LOW (ref 44.0–60.0)
pCO2, Ven: 41.2 mmHg — ABNORMAL LOW (ref 44.0–60.0)
pH, Ven: 7.385 (ref 7.250–7.430)
pH, Ven: 7.387 (ref 7.250–7.430)
pO2, Ven: 30 mmHg — CL (ref 32.0–45.0)
pO2, Ven: 32 mmHg (ref 32.0–45.0)

## 2020-12-30 LAB — GLUCOSE, CAPILLARY
Glucose-Capillary: 166 mg/dL — ABNORMAL HIGH (ref 70–99)
Glucose-Capillary: 138 mg/dL — ABNORMAL HIGH (ref 70–99)

## 2020-12-30 SURGERY — RIGHT HEART CATH
Anesthesia: LOCAL

## 2020-12-30 MED ORDER — SODIUM CHLORIDE 0.9% FLUSH: 3.0000 mL | INTRAVENOUS | Status: DC | PRN

## 2020-12-30 MED ORDER — ASPIRIN 81 MG PO CHEW: 81.0000 mg | CHEWABLE_TABLET | ORAL | Status: DC

## 2020-12-30 MED ORDER — LIDOCAINE HCL (PF) 1 % IJ SOLN
INTRAMUSCULAR | Status: DC | PRN
  Administered 2020-12-30: 2 mL

## 2020-12-30 MED ORDER — SODIUM CHLORIDE 0.9% FLUSH: 3.0000 mL | Freq: Two times a day (BID) | INTRAVENOUS | Status: DC

## 2020-12-30 MED ORDER — HEPARIN (PORCINE) IN NACL 1000-0.9 UT/500ML-% IV SOLN
INTRAVENOUS | Status: AC
  Filled 2020-12-30: qty 500

## 2020-12-30 MED ORDER — FUROSEMIDE 20 MG PO TABS: 40.0000 mg | ORAL_TABLET | Freq: Every day | ORAL | 3 refills | Status: DC

## 2020-12-30 MED ORDER — SODIUM CHLORIDE 0.9 % IV SOLN: INTRAVENOUS | Status: DC

## 2020-12-30 MED ORDER — HYDRALAZINE HCL 20 MG/ML IJ SOLN: 10.0000 mg | INTRAMUSCULAR | Status: DC | PRN

## 2020-12-30 MED ORDER — LIDOCAINE HCL (PF) 1 % IJ SOLN
INTRAMUSCULAR | Status: AC
  Filled 2020-12-30: qty 30

## 2020-12-30 MED ORDER — ACETAMINOPHEN 325 MG PO TABS: 650.0000 mg | ORAL_TABLET | ORAL | Status: DC | PRN

## 2020-12-30 MED ORDER — ASPIRIN 81 MG PO CHEW
81.0000 mg | CHEWABLE_TABLET | ORAL | Status: AC
  Administered 2020-12-30: 81 mg via ORAL

## 2020-12-30 MED ORDER — HEPARIN (PORCINE) IN NACL 1000-0.9 UT/500ML-% IV SOLN
INTRAVENOUS | Status: DC | PRN
  Administered 2020-12-30 (×2): 500 mL

## 2020-12-30 MED ORDER — ONDANSETRON HCL 4 MG/2ML IJ SOLN: 4.0000 mg | Freq: Four times a day (QID) | INTRAMUSCULAR | Status: DC | PRN

## 2020-12-30 MED ORDER — SODIUM CHLORIDE 0.9 % IV SOLN: 250.0000 mL | INTRAVENOUS | Status: DC | PRN

## 2020-12-30 MED ORDER — LABETALOL HCL 5 MG/ML IV SOLN: 10.0000 mg | INTRAVENOUS | Status: DC | PRN

## 2020-12-30 MED ORDER — ASPIRIN 81 MG PO CHEW
CHEWABLE_TABLET | ORAL | Status: AC
  Filled 2020-12-30: qty 1

## 2020-12-30 SURGICAL SUPPLY — 6 items
CATH BALLN WEDGE 5F 110CM (CATHETERS) ×1 IMPLANT
GUIDEWIRE .025 260CM (WIRE) ×1 IMPLANT
KIT HEART LEFT (KITS) ×2 IMPLANT
PACK CARDIAC CATHETERIZATION (CUSTOM PROCEDURE TRAY) ×2 IMPLANT
SHEATH GLIDE SLENDER 4/5FR (SHEATH) ×1 IMPLANT
TRANSDUCER W/STOPCOCK (MISCELLANEOUS) ×2 IMPLANT

## 2020-12-30 NOTE — Telephone Encounter (Signed)
-----   Message from Laurey Morale, MD sent at 12/30/2020 11:16 AM EDT ----- Cathed today.  Make sure she increases Lasix to 40 mg daily and gets BMET in 1 week.  Make sure she has followup with me.

## 2020-12-30 NOTE — Telephone Encounter (Signed)
Pt will have bmet 10/20 with afib. Pt scheduled with Dr.McLean 11/3.  Spoke with pt she is aware of med increase (lasix increased to 40mg  daily) and scheduled appointments.

## 2020-12-30 NOTE — Discharge Instructions (Signed)
Remove bandage in 24 hours. Then you may shower. Watch for signs of infection. Keep site clean and dry. Notify your physician if you notice any signs of infection.

## 2020-12-30 NOTE — Interval H&P Note (Signed)
History and Physical Interval Note:  12/30/2020 10:44 AM  Terry Hanson  has presented today for surgery, with the diagnosis of hf.  The various methods of treatment have been discussed with the patient and family. After consideration of risks, benefits and other options for treatment, the patient has consented to  Procedure(s): RIGHT HEART CATH (N/A) as a surgical intervention.  The patient's history has been reviewed, patient examined, no change in status, stable for surgery.  I have reviewed the patient's chart and labs.  Questions were answered to the patient's satisfaction.     Bowe Sidor [employer]

## 2021-01-02 ENCOUNTER — Encounter (HOSPITAL_COMMUNITY): Payer: Self-pay | Admitting: Cardiology

## 2021-01-05 ENCOUNTER — Ambulatory Visit (HOSPITAL_COMMUNITY)
Admission: RE | Admit: 2021-01-05 | Discharge: 2021-01-05 | Disposition: A | Discharge status: Home / Self Care | Payer: Medicare Other | Source: Ambulatory Visit | Attending: Nurse Practitioner | Admitting: Nurse Practitioner

## 2021-01-05 ENCOUNTER — Other Ambulatory Visit: Payer: Self-pay

## 2021-01-05 ENCOUNTER — Telehealth (HOSPITAL_COMMUNITY): Payer: Self-pay | Admitting: *Deleted

## 2021-01-05 VITALS — BP 120/58 | HR 78 | Ht 62.0 in | Wt 189.2 lb

## 2021-01-05 DIAGNOSIS — I5022 Chronic systolic (congestive) heart failure: Secondary | ICD-10-CM | POA: Insufficient documentation

## 2021-01-05 DIAGNOSIS — Z7901 Long term (current) use of anticoagulants: Secondary | ICD-10-CM | POA: Insufficient documentation

## 2021-01-05 DIAGNOSIS — N183 Chronic kidney disease, stage 3 unspecified: Secondary | ICD-10-CM | POA: Diagnosis not present

## 2021-01-05 DIAGNOSIS — Z7902 Long term (current) use of antithrombotics/antiplatelets: Secondary | ICD-10-CM | POA: Diagnosis not present

## 2021-01-05 DIAGNOSIS — Z79899 Other long term (current) drug therapy: Secondary | ICD-10-CM | POA: Insufficient documentation

## 2021-01-05 DIAGNOSIS — I4819 Other persistent atrial fibrillation: Secondary | ICD-10-CM | POA: Insufficient documentation

## 2021-01-05 DIAGNOSIS — I252 Old myocardial infarction: Secondary | ICD-10-CM | POA: Insufficient documentation

## 2021-01-05 DIAGNOSIS — D6869 Other thrombophilia: Secondary | ICD-10-CM

## 2021-01-05 DIAGNOSIS — Z87891 Personal history of nicotine dependence: Secondary | ICD-10-CM | POA: Diagnosis not present

## 2021-01-05 DIAGNOSIS — I2510 Atherosclerotic heart disease of native coronary artery without angina pectoris: Secondary | ICD-10-CM | POA: Insufficient documentation

## 2021-01-05 LAB — BASIC METABOLIC PANEL
Anion gap: 9 (ref 5–15)
BUN: 24 mg/dL — ABNORMAL HIGH (ref 8–23)
CO2: 27 mmol/L (ref 22–32)
Calcium: 9.4 mg/dL (ref 8.9–10.3)
Chloride: 103 mmol/L (ref 98–111)
Creatinine, Ser: 1.83 mg/dL — ABNORMAL HIGH (ref 0.44–1.00)
GFR, Estimated: 28 mL/min — ABNORMAL LOW (ref >60)
Glucose, Bld: 162 mg/dL — ABNORMAL HIGH (ref 70–99)
Potassium: 4.1 mmol/L (ref 3.5–5.1)
Sodium: 139 mmol/L (ref 135–145)

## 2021-01-05 LAB — TSH: TSH: 2.086 u[IU]/mL (ref 0.350–4.500)

## 2021-01-05 LAB — MAGNESIUM: Magnesium: 2.4 mg/dL (ref 1.7–2.4)

## 2021-01-05 MED ORDER — POTASSIUM CHLORIDE ER 10 MEQ PO TBCR: 10.0000 meq | EXTENDED_RELEASE_TABLET | Freq: Two times a day (BID) | ORAL | Status: DC

## 2021-01-05 MED ORDER — SPIRONOLACTONE 25 MG PO TABS: 12.5000 mg | ORAL_TABLET | Freq: Every day | ORAL | Status: DC

## 2021-01-05 NOTE — Telephone Encounter (Signed)
Pt left vm asking if she had an appt with our clinic today. I called pt back to notify her that her appt is with afib today at 1:30 no answer and vm full.

## 2021-01-05 NOTE — Progress Notes (Addendum)
Primary Care Physician: [name], MD Referring Physician: Dr. Jamesetta So Terry Hanson is a 80 y.o. female with a h/o CAD,   s/p MI, DM, systolic HF, VT, s/p ICD, EF of 25-30% , CKD stage lll, that is here to discuss restoring SR, referred by Dr. Shirlee Latch.   I has seen in the past (07/2019) for pt not tolerating amiodarone with extreme GI complaints. Dr. Johney Frame saw in the hospital at that time ( has just had an anterior MI) and thought rate control was best option at that time and possibly tikosyn could be considered down the line.   With her heart failure issues, Dr. Shirlee Latch wanted pt to  have option of restoring SR. I discussed with pt her options of Tikosyn or ablation. With her CKD, it appears that she would only be able to take 125 mcg bid. I fear if dose has to be reduced while loading  that she would be off drug, or if kidney function declines further that would also  be an issue. She might be a better ablation candidate and therefore will send to EP to further discuss.   Today, she denies symptoms of palpitations, chest pain, shortness of breath, orthopnea, PND, lower extremity edema, dizziness, presyncope, syncope, or neurologic sequela. The patient is tolerating medications without difficulties and is otherwise without complaint today.   Past Medical History:  Diagnosis Date   Arthritis    Asthma    CHF (congestive heart failure) (HCC)    Diabetes mellitus (HCC)    Diabetes mellitus without complication (HCC)    Enlarged heart    Hypercholesterolemia    Hyperlipidemia    Ischemic cardiomyopathy    a. EF 30-35% by echo in 06/2019   STEMI (ST elevation myocardial infarction) (HCC)    a. 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   Past Surgical History:  Procedure Laterality Date   ABDOMINAL HYSTERECTOMY     CATARACT EXTRACTION W/PHACO Right 06/27/2020   Procedure: CATARACT EXTRACTION PHACO AND INTRAOCULAR LENS PLACEMENT RIGHT EYE AND PLACEMENT OF CORTICOSTEROID;   Surgeon: Fabio Pierce, MD;  Location: [facility];  Service: Ophthalmology;  Laterality: Right;  right CDE=25.47   CORONARY/GRAFT ACUTE MI REVASCULARIZATION N/A 06/29/2019   Procedure: Coronary/Graft Acute MI Revascularization;  Surgeon: Lyn Records, MD;  Location: [facility] INVASIVE CV LAB;  Service: Cardiovascular;  Laterality: N/A;   ICD IMPLANT N/A 12/14/2019   Procedure: ICD IMPLANT;  Surgeon: Marinus Maw, MD;  Location: [facility] INVASIVE CV LAB;  Service: Cardiovascular;  Laterality: N/A;   LEFT HEART CATH AND CORONARY ANGIOGRAPHY N/A 06/29/2019   Procedure: LEFT HEART CATH AND CORONARY ANGIOGRAPHY;  Surgeon: Lyn Records, MD;  Location: [facility] INVASIVE CV LAB;  Service: Cardiovascular;  Laterality: N/A;   RIGHT HEART CATH N/A 12/30/2020   Procedure: RIGHT HEART CATH;  Surgeon: Laurey Morale, MD;  Location: [facility] INVASIVE CV LAB;  Service: Cardiovascular;  Laterality: N/A;    Current Outpatient Medications  Medication Sig Dispense Refill   apixaban (ELIQUIS) 5 MG TABS tablet Take 5 mg by mouth daily.     clopidogrel (PLAVIX) 75 MG tablet Take 1 tablet (75 mg total) by mouth daily before breakfast. 90 tablet 1   dapagliflozin propanediol (FARXIGA) 10 MG TABS tablet Take 1 tablet (10 mg total) by mouth daily before breakfast. 30 tablet 3   digoxin (LANOXIN) 0.125 MG tablet Take 1 tablet (0.125 mg total) by mouth 3 (three) times a week. Taking Monday, Wednesday,  and Friday. 45 tablet 3   furosemide (LASIX) 20 MG tablet Take 2 tablets (40 mg total) by mouth daily. 90 tablet 3   levothyroxine (SYNTHROID) 25 MCG tablet Take 1 tablet (25 mcg total) by mouth daily before breakfast. 90 tablet 3   metoprolol succinate (TOPROL-XL) 50 MG 24 hr tablet Take 1 tablet (50 mg total) by mouth at bedtime. 90 tablet 3   nitroGLYCERIN (NITROSTAT) 0.4 MG SL tablet Place 1 tablet (0.4 mg total) under the tongue every 5 (five) minutes as needed. 25 tablet 2   sacubitril-valsartan (ENTRESTO) 24-26 MG Take 1 tablet by mouth 2  (two) times daily. 90 tablet 1   simvastatin (ZOCOR) 20 MG tablet Take 1 tablet (20 mg total) by mouth every evening. 90 tablet 3   nystatin (MYCOSTATIN/NYSTOP) powder Apply topically 2 (two) times daily. (Patient not taking: No sig reported) 15 g 0   potassium chloride (KLOR-CON) 10 MEQ tablet Take 1 tablet (10 mEq total) by mouth 2 (two) times daily. May take 1 extra tablet by mouth for 2-3 pound weight gain overnight, shortness of breath, edema     spironolactone (ALDACTONE) 25 MG tablet Take 0.5 tablets (12.5 mg total) by mouth at bedtime.     No current facility-administered medications for this encounter.    Allergies  Allergen Reactions   Codeine Hives   Shrimp [Shellfish Allergy] Other (See Comments)    Broke out   Amiodarone Nausea And Vomiting   Penicillins Itching and Rash    Did it involve swelling of the face/tongue/throat, SOB, or low BP? Yes Did it involve sudden or severe rash/hives, skin peeling, or any reaction on the inside of your mouth or nose? Yes Did you need to seek medical attention at a hospital or doctor's office? Yes When did it last happen? Over 10 years       If all above answers are "NO", may proceed with cephalosporin use.    Sulfa Antibiotics Itching    Social History   Socioeconomic History   Marital status: Married    Spouse name: Not on file   Number of children: Not on file   Years of education: Not on file   Highest education level: Not on file  Occupational History   Not on file  Tobacco Use   Smoking status: Never   Smokeless tobacco: Former    Types: Associate Professor Use: Never used  Substance and Sexual Activity   Alcohol use: Not Currently   Drug use: Never   Sexual activity: Not on file  Other Topics Concern   Not on file  Social History Narrative   ** Merged History Encounter **       Social Determinants of Health   Financial Resource Strain: Not on file  Food Insecurity: Not on file  Transportation Needs:  Not on file  Physical Activity: Not on file  Stress: Not on file  Social Connections: Not on file  Intimate Partner Violence: Not on file    Family History  Problem Relation Age of Onset   Heart disease Mother     ROS- All systems are reviewed and negative except as per the HPI above  Physical Exam: Vitals:   01/05/21 1250  BP: (!) 120/58  Pulse: 78  Weight: 85.8 kg  Height: 5\' 2"  (1.575 m)   Wt Readings from Last 3 Encounters:  01/05/21 85.8 kg  12/30/20 90.3 kg  12/22/20 86.5 kg    Labs: Lab Results  Component Value Date   NA 139 01/05/2021   K 4.1 01/05/2021   CL 103 01/05/2021   CO2 27 01/05/2021   GLUCOSE 162 (H) 01/05/2021   BUN 24 (H) 01/05/2021   CREATININE 1.83 (H) 01/05/2021   CALCIUM 9.4 01/05/2021   PHOS 3.8 03/21/2020   MG 2.4 01/05/2021   Lab Results  Component Value Date   INR 2.2 (H) 07/27/2019   Lab Results  Component Value Date   CHOL 106 12/02/2019   HDL 31 (L) 12/02/2019   LDLCALC 50 12/02/2019   TRIG 136 03/19/2020     GEN- The patient is well appearing, alert and oriented x 3 today.   Head- normocephalic, atraumatic Eyes-  Sclera clear, conjunctiva pink Ears- hearing intact Oropharynx- clear Neck- supple, no JVP Lymph- no cervical lymphadenopathy Lungs- Clear to ausculation bilaterally, normal work of breathing Heart- irregular rate and rhythm, no murmurs, rubs or gallops, PMI not laterally displaced GI- soft, NT, ND, + BS Extremities- no clubbing, cyanosis, or edema MS- no significant deformity or atrophy Skin- no rash or lesion Psych- euthymic mood, full affect Neuro- strength and sensation are intact  EKG-afib at 78 bpm, with PVC's qrs int 94 ms, qtc 399 ms  Epic records reviewed    Assessment and Plan:  1. Persistent afib Has been in persistent afib for over a year She is rate controlled Amiodarone used in past short term Made pt very sick and does not want to go back to using amiodarone  I have concerns with  tiksoyn and CKD  Will only be able to start on lowest dose of drug  May be a better ablation candidate  Will refer to EP to further discuss    2. ICD Followed by Dr. Ladona Ridgel   3. HF Weight is stable  Per Dr. Shirlee Latch   4. CAD/s/p MI  Per Dr. Katrinka Blazing   5. CHA2DS2VASc  score of at least 6 Continue eliquis 5 mg bid   Terry Hanson. [facility] [address] [place], [place] [address] [phone]

## 2021-01-16 ENCOUNTER — Encounter: Payer: Self-pay | Admitting: Nurse Practitioner

## 2021-01-18 ENCOUNTER — Telehealth (HOSPITAL_COMMUNITY): Payer: Self-pay | Admitting: *Deleted

## 2021-01-18 NOTE — Telephone Encounter (Signed)
Referral closed as unable to reach patient to schedule EP consult.

## 2021-01-18 NOTE — Telephone Encounter (Signed)
-----   Message from Elyse Hsu sent at 01/13/2021 12:58 PM EDT ----- Regarding: RE: appt w afib ep Hey!   I've called several times. Phones goes to VM. Called emergency contact and her daughter said she will turn her phone on and get the message... ----- Message ----- From: Shona Simpson, RN Sent: 01/13/2021  11:30 AM EDT To: Elyse Hsu Subject: FW: appt w afib ep                              ----- Message ----- From: Shona Simpson, RN Sent: 01/05/2021   2:39 PM EDT To: Elyse Hsu Subject: appt w afib ep                                 Pt needs appt whoever is first avail to discuss afib ablation per donna Thanks Kennyth Arnold

## 2021-01-19 ENCOUNTER — Telehealth (HOSPITAL_COMMUNITY): Payer: Self-pay | Admitting: *Deleted

## 2021-01-19 ENCOUNTER — Other Ambulatory Visit: Payer: Self-pay

## 2021-01-19 ENCOUNTER — Telehealth (HOSPITAL_COMMUNITY): Payer: Self-pay | Admitting: Pharmacy Technician

## 2021-01-19 ENCOUNTER — Other Ambulatory Visit (HOSPITAL_COMMUNITY): Payer: Self-pay

## 2021-01-19 ENCOUNTER — Ambulatory Visit (HOSPITAL_COMMUNITY)
Admission: RE | Admit: 2021-01-19 | Discharge: 2021-01-19 | Disposition: A | Discharge status: Home / Self Care | Payer: Medicare Other | Source: Ambulatory Visit | Attending: Cardiology | Admitting: Cardiology

## 2021-01-19 VITALS — BP 114/54 | HR 75 | Ht 62.0 in | Wt 190.4 lb

## 2021-01-19 DIAGNOSIS — Z8249 Family history of ischemic heart disease and other diseases of the circulatory system: Secondary | ICD-10-CM | POA: Insufficient documentation

## 2021-01-19 DIAGNOSIS — I252 Old myocardial infarction: Secondary | ICD-10-CM | POA: Diagnosis not present

## 2021-01-19 DIAGNOSIS — N183 Chronic kidney disease, stage 3 unspecified: Secondary | ICD-10-CM | POA: Insufficient documentation

## 2021-01-19 DIAGNOSIS — I2510 Atherosclerotic heart disease of native coronary artery without angina pectoris: Secondary | ICD-10-CM | POA: Diagnosis not present

## 2021-01-19 DIAGNOSIS — I255 Ischemic cardiomyopathy: Secondary | ICD-10-CM | POA: Insufficient documentation

## 2021-01-19 DIAGNOSIS — Z7901 Long term (current) use of anticoagulants: Secondary | ICD-10-CM | POA: Diagnosis not present

## 2021-01-19 DIAGNOSIS — Z7902 Long term (current) use of antithrombotics/antiplatelets: Secondary | ICD-10-CM | POA: Diagnosis not present

## 2021-01-19 DIAGNOSIS — Z95810 Presence of automatic (implantable) cardiac defibrillator: Secondary | ICD-10-CM | POA: Diagnosis not present

## 2021-01-19 DIAGNOSIS — I5022 Chronic systolic (congestive) heart failure: Secondary | ICD-10-CM | POA: Insufficient documentation

## 2021-01-19 DIAGNOSIS — I4819 Other persistent atrial fibrillation: Secondary | ICD-10-CM | POA: Diagnosis not present

## 2021-01-19 DIAGNOSIS — I509 Heart failure, unspecified: Secondary | ICD-10-CM | POA: Diagnosis not present

## 2021-01-19 DIAGNOSIS — Z8616 Personal history of COVID-19: Secondary | ICD-10-CM | POA: Diagnosis not present

## 2021-01-19 LAB — BASIC METABOLIC PANEL
Anion gap: 9 (ref 5–15)
BUN: 36 mg/dL — ABNORMAL HIGH (ref 8–23)
CO2: 26 mmol/L (ref 22–32)
Calcium: 9.4 mg/dL (ref 8.9–10.3)
Chloride: 99 mmol/L (ref 98–111)
Creatinine, Ser: 2.17 mg/dL — ABNORMAL HIGH (ref 0.44–1.00)
GFR, Estimated: 23 mL/min — ABNORMAL LOW (ref >60)
Glucose, Bld: 179 mg/dL — ABNORMAL HIGH (ref 70–99)
Potassium: 4.6 mmol/L (ref 3.5–5.1)
Sodium: 134 mmol/L — ABNORMAL LOW (ref 135–145)

## 2021-01-19 LAB — BRAIN NATRIURETIC PEPTIDE: B Natriuretic Peptide: 565.8 pg/mL — ABNORMAL HIGH (ref 0.0–100.0)

## 2021-01-19 MED ORDER — SPIRONOLACTONE 25 MG PO TABS: 12.5000 mg | ORAL_TABLET | Freq: Every day | ORAL | 6 refills | Status: DC

## 2021-01-19 MED ORDER — SPIRONOLACTONE 25 MG PO TABS: 25.0000 mg | ORAL_TABLET | Freq: Every day | ORAL | 2 refills | Status: DC

## 2021-01-19 MED ORDER — FUROSEMIDE 20 MG PO TABS: ORAL_TABLET | ORAL | 3 refills | Status: DC

## 2021-01-19 MED ORDER — FUROSEMIDE 20 MG PO TABS: 40.0000 mg | ORAL_TABLET | Freq: Every day | ORAL | 3 refills | Status: DC

## 2021-01-19 NOTE — Patient Instructions (Signed)
Continue Lasix at 40 mg daily  Increase Spironolactone to 25 mg daily  Lab work done today we will call you with any abnormal results  Lab work for 10 days   You have been referred to Electrophysiology for Afib Ablation  Your physician recommends that you schedule a follow-up appointment in: 1 month with the app clinic.  Your physician has recommended that you have a cardiopulmonary stress test (CPX). CPX testing is a non-invasive measurement of heart and lung function. It replaces a traditional treadmill stress test. This type of test provides a tremendous amount of information that relates not only to your present condition but also for future outcomes. This test combines measurements of you ventilation, respiratory gas exchange in the lungs, electrocardiogram (EKG), blood pressure and physical response before, during, and following an exercise protocol.  If you have any questions or concerns before your next appointment please send [place] a message through [place] or call our office at [phone].    TO LEAVE A MESSAGE FOR THE NURSE SELECT OPTION 2, PLEASE LEAVE A MESSAGE INCLUDING: YOUR NAME DATE OF BIRTH CALL BACK NUMBER REASON FOR CALL**this is important as we prioritize the call backs  YOU WILL RECEIVE A CALL BACK THE SAME DAY AS LONG AS YOU CALL BEFORE 4:00 PM At the [facility], you and your health needs are our priority. As part of our continuing mission to provide you with exceptional heart care, we have created designated Provider Care Teams. These Care Teams include your primary Cardiologist (physician) and Advanced Practice Providers (APPs- Physician Assistants and Nurse Practitioners) who all work together to provide you with the care you need, when you need it.   You may see any of the following providers on your designated Care Team at your next follow up: Dr Arvilla Meres Dr Carron Curie, NP Robbie Lis, [place] [facility] [place], [place] Karle Plumber, PharmD   Please be sure to bring in all your medications bottles to every appointment.

## 2021-01-19 NOTE — Telephone Encounter (Signed)
Advanced Heart Failure Patient Advocate Encounter  Patient was seen in clinic today and expressed concern affording Entresto and [place]. The patient is currently in the donut hole. Started applications for [employer] and [employer].  Will fax in once signatures are obtained.

## 2021-01-19 NOTE — Telephone Encounter (Signed)
-----   Message from Laurey Morale, MD sent at 01/19/2021 12:32 PM EDT ----- With elevated creatinine, let's have her keep spironolactone at 12.5 mg daily (do not increase).  Would repeat BMET 10 days to make sure creatinine does not continue to rise.

## 2021-01-19 NOTE — Telephone Encounter (Signed)
Spoke w/pt, she is aware, agreeable, and verbalized understanding, will have labs rechecked next week at Dr Elberta Fortis office, order in

## 2021-01-19 NOTE — Progress Notes (Signed)
Medication Samples have been provided to the patient.  Drug name: Eliquis       Strength: 5 mg        Qty: 4  LOT: [id number]  Exp.Date: 11/2022  Dosing instructions: Take 1 tablet Twice daily   The patient has been instructed regarding the correct time, dose, and frequency of taking this medication, including desired effects and most common side effects.   Terry Hanson 11:17 AM 01/19/2021

## 2021-01-19 NOTE — Progress Notes (Signed)
PCP: [name], MD Cardiology: Dr. Katrinka Blazing HF Cardiology: Dr. Shirlee Latch  80 y.o. with history of CAD, chronic systolic CHF, CKD stage 3 and atrial fibrillation was referred by Dr. Katrinka Blazing for evaluation of CHF.  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  She developed ischemic cardiomyopathy, last echo in 1/22 showed EF 25-30%.  She now has a Secondary school teacher ICD. Patient also has persistent atrial fibrillation.  She was unable to take amiodarone in the past due to nausea/vomiting.    RHC was done in 10/22, showing mildly elevated right and left heart filling pressures and CI 1.96.  Lasix was increased to 40 mg daily.    She was seen in atrial fibrillation clinic.  Because of CKD, would only be able to take low dose Tikosyn.  Therefore, she has been referred to EP for evaluation for atrial fibrillation ablation.   She returns for followup of CHF.  She feels better on higher Lasix dose.  She has been able to clean her house, including mopping the floor.  No dyspnea walking on flat ground.  No chest pain.  She remains dyspneic with heavy exertion.  She chronically sleeps on 2 pillows.  No lightheadedness.  She does not feel palpitations.    Labs (5/22): K 4.4, creatinine 1.47 Labs (11/22): K 4.1, creatinine 1.83 => 2.17  St Jude device interrogation: thoracic impedance trending up.   PMH: 1. CAD: Anterior MI in 4/21.  LHC with 99% pLAD, 70% pRCA, 80%, OM1, 60% mLCx.  Patient had PCI to pLAD.   2. Chronic systolic CHF: Ischemic cardiomyopathy.  St Jude ICD.  - Echo (1/22): EF 25-30%, RV normal.  - RHC (10/22): mean RA 10, PA 37/16 mean 26, mean PCWP 23, CI 1.96 3. CKD stage 3 4. Type 2 diabetes 5. Hyperlipidemia: Myalgias with Crestor and Lipitor.  6. Atrial fibrillation: Persistent.  - Nausea/vomiting with amiodarone.  7. H/o VT 8. COVID-19 in 1/22  Social History   Socioeconomic History   Marital status: Married    Spouse name: Not on file   Number of children: Not on  file   Years of education: Not on file   Highest education level: Not on file  Occupational History   Not on file  Tobacco Use   Smoking status: Never   Smokeless tobacco: Former    Types: Associate Professor Use: Never used  Substance and Sexual Activity   Alcohol use: Not Currently   Drug use: Never   Sexual activity: Not on file  Other Topics Concern   Not on file  Social History Narrative   ** Merged History Encounter **       Social Determinants of Health   Financial Resource Strain: Not on file  Food Insecurity: Not on file  Transportation Needs: Not on file  Physical Activity: Not on file  Stress: Not on file  Social Connections: Not on file  Intimate Partner Violence: Not on file   Family History  Problem Relation Age of Onset   Heart disease Mother    ROS: All systems reviewed and negative except as per HPI.   Current Outpatient Medications  Medication Sig Dispense Refill   apixaban (ELIQUIS) 5 MG TABS tablet Take 5 mg by mouth daily.     clopidogrel (PLAVIX) 75 MG tablet Take 1 tablet (75 mg total) by mouth daily before breakfast. 90 tablet 1   dapagliflozin propanediol (FARXIGA) 10 MG TABS tablet Take 1  tablet (10 mg total) by mouth daily before breakfast. 30 tablet 3   digoxin (LANOXIN) 0.125 MG tablet Take 1 tablet (0.125 mg total) by mouth 3 (three) times a week. Taking Monday, Wednesday, and Friday. 45 tablet 3   levothyroxine (SYNTHROID) 25 MCG tablet Take 1 tablet (25 mcg total) by mouth daily before breakfast. 90 tablet 3   metoprolol succinate (TOPROL-XL) 50 MG 24 hr tablet Take 1 tablet (50 mg total) by mouth at bedtime. 90 tablet 3   nitroGLYCERIN (NITROSTAT) 0.4 MG SL tablet Place 1 tablet (0.4 mg total) under the tongue every 5 (five) minutes as needed. 25 tablet 2   potassium chloride (KLOR-CON) 10 MEQ tablet Take 1 tablet (10 mEq total) by mouth 2 (two) times daily. May take 1 extra tablet by mouth for 2-3 pound weight gain overnight,  shortness of breath, edema     sacubitril-valsartan (ENTRESTO) 24-26 MG Take 1 tablet by mouth 2 (two) times daily. 90 tablet 1   simvastatin (ZOCOR) 20 MG tablet Take 1 tablet (20 mg total) by mouth every evening. 90 tablet 3   furosemide (LASIX) 20 MG tablet Take 2 tablets (40 mg total) by mouth daily. 120 tablet 3   spironolactone (ALDACTONE) 25 MG tablet Take 0.5 tablets (12.5 mg total) by mouth at bedtime. 15 tablet 6   No current facility-administered medications for this encounter.   BP (!) 114/54   Pulse 75   Ht 5\' 2"  (1.575 m)   Wt 86.4 kg (190 lb 6.4 oz)   SpO2 98%   BMI 34.82 kg/m  General: NAD Neck: JVP 8 cm, no thyromegaly or thyroid nodule.  Lungs: Clear to auscultation bilaterally with normal respiratory effort. CV: Nondisplaced PMI.  Heart regular S1/S2, no S3/S4, no murmur.  No peripheral edema.  No carotid bruit.  Normal pedal pulses.  Abdomen: Soft, nontender, no hepatosplenomegaly, no distention.  Skin: Intact without lesions or rashes.  Neurologic: Alert and oriented x 3.  Psych: Normal affect. Extremities: No clubbing or cyanosis.  HEENT: Normal.   1. CAD: Patient has history of anterior MI in 4/21 with PCI to the proximal LAD.  No chest pain.  - She is on apixaban and Plavix.  Dr. Tamala Julian has wanted her to continue Plavix.    - Continue simvastatin 20 mg daily (has had myalgias with other statins).  Needs lipids/LFTs at followup appt.  2. Chronic systolic CHF: Ischemic cardiomyopathy with St Jude ICD.  Last echo in 1/22 with EF 25-30%.  RHC in 10/22 showed low cardiac index at 1.96 and mildly increased filling pressures.  She has felt better since increasing Lasix, NYHA class II.  By exam and [place], she is not significantly volume overloaded.  Creatinine is up some to 2.17.  - Continue Toprol XL to 50 mg daily, take at bedtime.  - Continue Entresto 24/26 bid.   - Continue digoxin, check level today.  - Can increase spironolactone to 25 mg daily, check BMET in 10  days.  - Continue dapagliflozin.   - Keep Lasix at 40 mg daily.  - Narrow QRS, not CRT candidate.  3. Atrial fibrillation: Persistent.  She did not tolerate amiodarone.  AF in the setting of chronic systolic CHF may certainly be contributing to her symptoms.  I think that she merits an attempt at return to NSR.  Poor candidate for Tikosyn with CKD.  - She has been referred to EP for consideration for atrial fibrillation ablation.  No antiarrhythmic options as above.   -  Continue Eliquis.  4. CKD: Stage 3.  Creatinine up to 2.17 today.  Follow closely.   Followup 1 month with APP.   Loralie Champagne 01/19/2021

## 2021-01-20 NOTE — Addendum Note (Signed)
Encounter addended by: Suezanne Cheshire, RN on: 01/20/2021 2:22 PM  Actions taken: Order list changed, Diagnosis association updated

## 2021-01-23 ENCOUNTER — Ambulatory Visit (INDEPENDENT_AMBULATORY_CARE_PROVIDER_SITE_OTHER): Payer: Medicare Other | Admitting: Cardiology

## 2021-01-23 ENCOUNTER — Other Ambulatory Visit: Payer: Self-pay

## 2021-01-23 ENCOUNTER — Encounter: Payer: Self-pay | Admitting: Cardiology

## 2021-01-23 ENCOUNTER — Other Ambulatory Visit: Payer: Medicare Other | Admitting: *Deleted

## 2021-01-23 VITALS — BP 112/66 | HR 71 | Ht 62.0 in | Wt 181.0 lb

## 2021-01-23 DIAGNOSIS — I509 Heart failure, unspecified: Secondary | ICD-10-CM | POA: Diagnosis not present

## 2021-01-23 DIAGNOSIS — I255 Ischemic cardiomyopathy: Secondary | ICD-10-CM | POA: Diagnosis not present

## 2021-01-23 DIAGNOSIS — I4819 Other persistent atrial fibrillation: Secondary | ICD-10-CM

## 2021-01-23 LAB — BASIC METABOLIC PANEL
BUN/Creatinine Ratio: 17 (ref 12–28)
BUN: 39 mg/dL — ABNORMAL HIGH (ref 8–27)
CO2: 24 mmol/L (ref 20–29)
Calcium: 10.1 mg/dL (ref 8.7–10.3)
Chloride: 101 mmol/L (ref 96–106)
Creatinine, Ser: 2.33 mg/dL — ABNORMAL HIGH (ref 0.57–1.00)
Glucose: 147 mg/dL — ABNORMAL HIGH (ref 70–99)
Potassium: 4.5 mmol/L (ref 3.5–5.2)
Sodium: 138 mmol/L (ref 134–144)
eGFR: 21 mL/min/{1.73_m2} — ABNORMAL LOW (ref >59)

## 2021-01-23 NOTE — Progress Notes (Signed)
Electrophysiology Office Note   Date:  01/23/2021   ID:  Terry Hanson, DOB 12-03-1940, MRN [id number]  PCP:  Pcp, No  Cardiologist:  Shirlee Latch Primary Electrophysiologist:  Deairra Halleck Jorja Loa, MD    Chief Complaint: AF   History of Present Illness: Terry Hanson is a 80 y.o. female who is being seen today for the evaluation of AF at the request of Laurey Morale, MD. Presenting today for electrophysiology evaluation.  She has a history significant for coronary artery disease, chronic systolic heart failure, CKD stage III, atrial fibrillation.  She had an anterior MI April 2021 with drug-eluting stent to the LAD.  She developed an ischemic cardiomyopathy with an ejection fraction of 25 to 30%.  She is status post [facility] Jude ICD.  She also has persistent atrial fibrillation.  She has been unable to take amiodarone in the past due to nausea and vomiting.  Today, she denies symptoms of palpitations, chest pain, shortness of breath, orthopnea, PND, lower extremity edema, claudication, dizziness, presyncope, syncope, bleeding, or neurologic sequela. The patient is tolerating medications without difficulties.  Her main complaint today is weakness and fatigue.  She states that she feels weak and fatigued during most days.   Past Medical History:  Diagnosis Date   Arthritis    Asthma    CHF (congestive heart failure) (HCC)    Diabetes mellitus (HCC)    Diabetes mellitus without complication (HCC)    Enlarged heart    Hypercholesterolemia    Hyperlipidemia    Ischemic cardiomyopathy    a. EF 30-35% by echo in 06/2019   STEMI (ST elevation myocardial infarction) (HCC)    a. 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   Past Surgical History:  Procedure Laterality Date   ABDOMINAL HYSTERECTOMY     CATARACT EXTRACTION W/PHACO Right 06/27/2020   Procedure: CATARACT EXTRACTION PHACO AND INTRAOCULAR LENS PLACEMENT RIGHT EYE AND PLACEMENT OF CORTICOSTEROID;  Surgeon: Fabio Pierce, MD;   Location: [facility];  Service: Ophthalmology;  Laterality: Right;  right CDE=25.47   CORONARY/GRAFT ACUTE MI REVASCULARIZATION N/A 06/29/2019   Procedure: Coronary/Graft Acute MI Revascularization;  Surgeon: Lyn Records, MD;  Location: [facility] INVASIVE CV LAB;  Service: Cardiovascular;  Laterality: N/A;   ICD IMPLANT N/A 12/14/2019   Procedure: ICD IMPLANT;  Surgeon: Marinus Maw, MD;  Location: [facility] INVASIVE CV LAB;  Service: Cardiovascular;  Laterality: N/A;   LEFT HEART CATH AND CORONARY ANGIOGRAPHY N/A 06/29/2019   Procedure: LEFT HEART CATH AND CORONARY ANGIOGRAPHY;  Surgeon: Lyn Records, MD;  Location: [facility] INVASIVE CV LAB;  Service: Cardiovascular;  Laterality: N/A;   RIGHT HEART CATH N/A 12/30/2020   Procedure: RIGHT HEART CATH;  Surgeon: Laurey Morale, MD;  Location: [facility] INVASIVE CV LAB;  Service: Cardiovascular;  Laterality: N/A;     Current Outpatient Medications  Medication Sig Dispense Refill   apixaban (ELIQUIS) 5 MG TABS tablet Take 5 mg by mouth daily.     clopidogrel (PLAVIX) 75 MG tablet Take 1 tablet (75 mg total) by mouth daily before breakfast. 90 tablet 1   dapagliflozin propanediol (FARXIGA) 10 MG TABS tablet Take 1 tablet (10 mg total) by mouth daily before breakfast. 30 tablet 3   digoxin (LANOXIN) 0.125 MG tablet Take 1 tablet (0.125 mg total) by mouth 3 (three) times a week. Taking Monday, Wednesday, and Friday. 45 tablet 3   furosemide (LASIX) 20 MG tablet Take 2 tablets (40 mg total) by mouth daily. 120 tablet  3   levothyroxine (SYNTHROID) 25 MCG tablet Take 1 tablet (25 mcg total) by mouth daily before breakfast. 90 tablet 3   metoprolol succinate (TOPROL-XL) 50 MG 24 hr tablet Take 1 tablet (50 mg total) by mouth at bedtime. 90 tablet 3   nitroGLYCERIN (NITROSTAT) 0.4 MG SL tablet Place 1 tablet (0.4 mg total) under the tongue every 5 (five) minutes as needed. 25 tablet 2   potassium chloride (KLOR-CON) 10 MEQ tablet Take 1 tablet (10 mEq total) by mouth 2 (two)  times daily. May take 1 extra tablet by mouth for 2-3 pound weight gain overnight, shortness of breath, edema     sacubitril-valsartan (ENTRESTO) 24-26 MG Take 1 tablet by mouth 2 (two) times daily. 90 tablet 1   simvastatin (ZOCOR) 20 MG tablet Take 1 tablet (20 mg total) by mouth every evening. 90 tablet 3   spironolactone (ALDACTONE) 25 MG tablet Take 0.5 tablets (12.5 mg total) by mouth at bedtime. 15 tablet 6   No current facility-administered medications for this visit.    Allergies:   Codeine, Shrimp [shellfish allergy], Amiodarone, Penicillins, and Sulfa antibiotics   Social History:  The patient  reports that she has never smoked. She has quit using smokeless tobacco.  Her smokeless tobacco use included chew. She reports that she does not currently use alcohol. She reports that she does not use drugs.   Family History:  The patient's family history includes Heart disease in her mother.    ROS:  Please see the history of present illness.   Otherwise, review of systems is positive for none.   All other systems are reviewed and negative.    PHYSICAL EXAM: VS:  BP 112/66   Pulse 71   Ht 5\' 2"  (1.575 m)   Wt 181 lb (82.1 kg)   SpO2 98%   BMI 33.11 kg/m  , BMI Body mass index is 33.11 kg/m. GEN: Well nourished, well developed, in no acute distress  HEENT: normal  Neck: no JVD, carotid bruits, or masses Cardiac: irregular; no murmurs, rubs, or gallops,no edema  Respiratory:  clear to auscultation bilaterally, normal work of breathing GI: soft, nontender, nondistended, + BS MS: no deformity or atrophy  Skin: warm and dry, device pocket is well healed Neuro:  Strength and sensation are intact Psych: euthymic mood, full affect  EKG:  EKG is ordered today. Personal review of the ekg ordered shows atrial fibrillation  Device interrogation is reviewed today in detail.  See PaceArt for details.   Recent Labs: 03/21/2020: ALT 11 12/30/2020: Hemoglobin 11.6; Hemoglobin 11.6;  Platelets 216 01/05/2021: Magnesium 2.4; TSH 2.086 01/19/2021: B Natriuretic Peptide 565.8; BUN 36; Creatinine, Ser 2.17; Potassium 4.6; Sodium 134    Lipid Panel     Component Value Date/Time   CHOL 106 12/02/2019 1134   TRIG 136 03/19/2020 2220   HDL 31 (L) 12/02/2019 1134   CHOLHDL 5.2 06/29/2019 2218   VLDL 23 06/29/2019 2218   LDLCALC 50 12/02/2019 1134     Wt Readings from Last 3 Encounters:  01/23/21 181 lb (82.1 kg)  01/19/21 190 lb 6.4 oz (86.4 kg)  01/05/21 189 lb 3.2 oz (85.8 kg)      Other studies Reviewed: Additional studies/ records that were reviewed today include: TTE 03/20/20  Review of the above records today demonstrates:   1. Akinesis of the anterior, septal and apical walls with overall severe  LV dysfunction; no apical thrombus noted using definity.   2. Left ventricular ejection fraction,  by estimation, is 25 to 30%. The  left ventricle has severely decreased function. The left ventricle  demonstrates regional wall motion abnormalities (see scoring  diagram/findings for description). Left ventricular  diastolic function could not be evaluated.   3. Right ventricular systolic function is normal. The right ventricular  size is normal. There is normal pulmonary artery systolic pressure.   4. The mitral valve is normal in structure. Mild mitral valve  regurgitation. No evidence of mitral stenosis.   5. The aortic valve has an indeterminant number of cusps. Aortic valve  regurgitation is not visualized. No aortic stenosis is present.   6. The inferior vena cava is normal in size with greater than 50%  respiratory variability, suggesting right atrial pressure of 3 mmHg.    ASSESSMENT AND PLAN:  1.  Chronic systolic heart failure due to ischemic cardiomyopathy: Status post [place] ICD.  Ejection fraction 25 to 30%.  Currently off medical therapy per heart failure cardiology.  2.  Persistent atrial fibrillation: Has not been able to tolerate amiodarone.   It is thought that atrial fibrillation has been contributing to her symptoms.  Currently on Eliquis 5 mg twice daily.  CHA2DS2-VASc of at least 5.  Unfortunately she does not have any other antiarrhythmic options due to CKD stage III.  At this point she would like to get back into normal rhythm.  Due to that, we Kayann Maj plan for ablation.  Risk, benefits, and alternatives to EP study and radiofrequency ablation for afib were also discussed in detail today. These risks include but are not limited to stroke, bleeding, vascular damage, tamponade, perforation, damage to the esophagus, lungs, and other structures, pulmonary vein stenosis, worsening renal function, and death. The patient understands these risk and wishes to proceed.  We Mervyn Pflaum therefore proceed with catheter ablation at the next available time.  Carto, ICE, anesthesia are requested for the procedure.  Boneta Standre also obtain TEE prior to procedure to rule out left atrial appendage thrombus.  3.  Coronary artery disease: Status post LAD stent.  Currently on Plavix and Eliquis.  Discussed with primary cardiology  Current medicines are reviewed at length with the patient today.   The patient does not have concerns regarding her medicines.  The following changes were made today:  none  Labs/ tests ordered today include:  Orders Placed This Encounter  Procedures   EKG 12-Lead      Disposition:   FU with Solace Manwarren 3 months  Signed, Enyla Lisbon Meredith Leeds, MD  01/23/2021 11:23 AM     [address] [place] [address] [phone] (office) [phone] (fax)

## 2021-01-23 NOTE — Patient Instructions (Signed)
Medication Instructions:  Your physician recommends that you continue on your current medications as directed. Please refer to the Current Medication list given to you today.  *If you need a refill on your cardiac medications before your next appointment, please call your pharmacy*   Lab Work: Pre procedure labs 03/24/2021  If you have labs (blood work) drawn today and your tests are completely normal, you will receive your results only by: MyChart Message (if you have MyChart) OR A paper copy in the mail If you have any lab test that is abnormal or we need to change your treatment, we will call you to review the results.   Testing/Procedures: Your physician has requested that you have a TEE. During a TEE, sound waves are used to create images of your heart. It provides your doctor with information about the size and shape of your heart and how well your heart's chambers and valves are working. In this test, a transducer is attached to the end of a flexible tube that's guided down your throat and into your esophagus (the tube leading from you mouth to your stomach) to get a more detailed image of your heart. You are not awake for the procedure.  Please follow instruction below located under "other instructions".  Your physician has recommended that you have an ablation. Catheter ablation is a medical procedure used to treat some cardiac arrhythmias (irregular heartbeats). During catheter ablation, a long, thin, flexible tube is put into a blood vessel in your groin (upper thigh), or neck. This tube is called an ablation catheter. It is then guided to your heart through the blood vessel. Radio frequency waves destroy small areas of heart tissue where abnormal heartbeats may cause an arrhythmia to start. Please follow instruction below located under "other instructions".   Follow-Up: At [facility], you and your health needs are our priority.  As part of our continuing mission to provide you  with exceptional heart care, we have created designated Provider Care Teams.  These Care Teams include your primary Cardiologist (physician) and Advanced Practice Providers (APPs -  Physician Assistants and Nurse Practitioners) who all work together to provide you with the care you need, when you need it.  We recommend signing up for the patient portal called "MyChart".  Sign up information is provided on this After Visit Summary.  MyChart is used to connect with patients for Virtual Visits (Telemedicine).  Patients are able to view lab/test results, encounter notes, upcoming appointments, etc.  Non-urgent messages can be sent to your provider as well.   To learn more about what you can do with MyChart, go to [email or web address].    Your next appointment:   1 month(s) after your ablation  The format for your next appointment:   In Person  Provider:   AFib clinic   Thank you for choosing [facility]!!   Dory Horn, RN [phone]    Other Instructions  TEE You are scheduled for a TEE on 04/04/2021 with Dr. Anne Fu.  Please arrive at the [facility] (Main Entrance A) at [facility]: [address] [place], [place] [address] at 9:00 am am   DIET: Nothing to eat or drink after midnight except a sip of water with medications (see medication instructions below)  FYI: For your safety, and to allow [place] to monitor your vital signs accurately during the surgery/procedure we request that   if you have artificial nails, gel coating, SNS etc. Please have those removed prior to your  surgery/procedure. Not having the nail coverings /polish removed may result in cancellation or delay of your surgery/procedure.   Medication Instructions: Hold all medications the morning of this procedure  Continue your anticoagulant: Eliquis You will need to continue your anticoagulant after your procedure until you are told by your provider that it is safe to stop   Labs: 03/24/2021  You  must have a responsible person to drive you home and stay in the waiting area during your procedure. Failure to do so could result in cancellation.  Bring your insurance cards.  *Special Note: Every effort is made to have your procedure done on time. Occasionally there are emergencies that occur at the hospital that may cause delays. Please be patient if a delay does occur.       Electrophysiology/Ablation Procedure Instructions   You are scheduled for a(n)  ablation on 04/05/2021 with Dr. Loman Brooklyn.   1.   Pre procedure testing-             A.  LAB WORK --- On 03/24/2021 for your pre procedure blood work.     On the day of your procedure 04/05/2021 you will go to [facility] [phone]) at 6:30 am.  Bonita Quin will go to the main entrance A [employer]) and enter where the [employer] are.  Your driver will drop you off and you will head down the hallway to ADMITTING.  You may have one support person come in to the hospital with you.  They will be asked to wait in the waiting room. It is OK to have someone drop you off and come back when you are ready to be discharged.   3.   Do not eat or drink after midnight prior to your procedure.   4.   On the morning of your procedure do NOT take any medication. Do not miss any doses of your blood thinner prior to the morning of your procedure or your procedure will need to be rescheduled.   5.  Plan for an overnight stay but you may be discharged after your procedure, if you use your phone frequently bring your phone charger. If you are discharged after your procedure you will need someone to drive you home and be with you for 24 hours after your procedure.   6. You will follow up with the AFIB clinic 4 weeks after your procedure.  You will follow up with Dr. Elberta Fortis  3 months after your procedure.  These appointments will be made for you.   7. FYI: For your safety, and to allow [place] to monitor your vital signs accurately during the  surgery/procedure we request that if you have artificial nails, gel coating, SNS etc. Please have those removed prior to your surgery/procedure. Not having the nail coverings /polish removed may result in cancellation or delay of your surgery/procedure.  * If you have ANY questions please call the office [phone] and ask for Patirica Longshore RN or send me a MyChart message   * Occasionally, EP Studies and ablations can become lengthy.  Please make your family aware of this before your procedure starts.  Average time ranges from 2-8 hours for EP studies/ablations.  Your physician will call your family after the procedure with the results.                                    Cardiac  Ablation Cardiac ablation is a procedure to destroy (ablate) some heart tissue that is sending bad signals. These bad signals cause problems in heart rhythm. The heart has many areas that make these signals. If there are problems in these areas, they can make the heart beat in a way that is not normal. Destroying some tissues can help make the heart rhythm normal. Tell your doctor about: Any allergies you have. All medicines you are taking. These include vitamins, herbs, eye drops, creams, and over-the-counter medicines. Any problems you or family members have had with medicines that make you fall asleep (anesthetics). Any blood disorders you have. Any surgeries you have had. Any medical conditions you have, such as kidney failure. Whether you are pregnant or may be pregnant. What are the risks? This is a safe procedure. But problems may occur, including: Infection. Bruising and bleeding. Bleeding into the chest. Stroke or blood clots. Damage to nearby areas of your body. Allergies to medicines or dyes. The need for a pacemaker if the normal system is damaged. Failure of the procedure to treat the problem. What happens before the procedure? Medicines Ask your doctor about: Changing or stopping your normal  medicines. This is important. Taking aspirin and ibuprofen. Do not take these medicines unless your doctor tells you to take them. Taking other medicines, vitamins, herbs, and supplements. General instructions Follow instructions from your doctor about what you cannot eat or drink. Plan to have someone take you home from the hospital or clinic. If you will be going home right after the procedure, plan to have someone with you for 24 hours. Ask your doctor what steps will be taken to prevent infection. What happens during the procedure?  An IV tube will be put into one of your veins. You will be given a medicine to help you relax. The skin on your neck or groin will be numbed. A cut (incision) will be made in your neck or groin. A needle will be put through your cut and into a large vein. A tube (catheter) will be put into the needle. The tube will be moved to your heart. Dye may be put through the tube. This helps your doctor see your heart. Small devices (electrodes) on the tube will send out signals. A type of energy will be used to destroy some heart tissue. The tube will be taken out. Pressure will be held on your cut. This helps stop bleeding. A bandage will be put over your cut. The exact procedure may vary among doctors and hospitals. What happens after the procedure? You will be watched until you leave the hospital or clinic. This includes checking your heart rate, breathing rate, oxygen, and blood pressure. Your cut will be watched for bleeding. You will need to lie still for a few hours. Do not drive for 24 hours or as long as your doctor tells you. Summary Cardiac ablation is a procedure to destroy some heart tissue. This is done to treat heart rhythm problems. Tell your doctor about any medical conditions you may have. Tell him or her about all medicines you are taking to treat them. This is a safe procedure. But problems may occur. These include infection, bruising,  bleeding, and damage to nearby areas of your body. Follow what your doctor tells you about food and drink. You may also be told to change or stop some of your medicines. After the procedure, do not drive for 24 hours or as long as your doctor tells you. This  information is not intended to replace advice given to you by your health care provider. Make sure you discuss any questions you have with your health care provider. Document Revised: 02/05/2019 Document Reviewed: 02/05/2019 Elsevier Patient Education  2022 [employer].   .

## 2021-01-27 ENCOUNTER — Telehealth (HOSPITAL_COMMUNITY): Payer: Self-pay

## 2021-01-27 NOTE — Telephone Encounter (Signed)
-----   Message from Laurey Morale, MD sent at 01/24/2021 10:38 PM EST ----- Creatinine still high, hold Lasix for a day then decrease to 20 mg daily with BMET in 1 week.

## 2021-01-27 NOTE — Telephone Encounter (Signed)
Tried calling patient, no answer,unable to leave message.vm full, letter mailed to address on file

## 2021-01-31 ENCOUNTER — Other Ambulatory Visit (HOSPITAL_COMMUNITY): Payer: Self-pay | Admitting: *Deleted

## 2021-01-31 MED ORDER — ENTRESTO 24-26 MG PO TABS: 1.0000 | ORAL_TABLET | Freq: Two times a day (BID) | ORAL | 3 refills | Status: DC

## 2021-02-02 ENCOUNTER — Other Ambulatory Visit (HOSPITAL_COMMUNITY): Payer: Self-pay | Admitting: *Deleted

## 2021-02-02 ENCOUNTER — Ambulatory Visit (HOSPITAL_COMMUNITY): Payer: Medicare Other | Attending: Internal Medicine

## 2021-02-02 DIAGNOSIS — I5022 Chronic systolic (congestive) heart failure: Secondary | ICD-10-CM | POA: Insufficient documentation

## 2021-02-03 ENCOUNTER — Other Ambulatory Visit (HOSPITAL_COMMUNITY): Payer: Self-pay | Admitting: Cardiology

## 2021-02-03 MED ORDER — APIXABAN 5 MG PO TABS: 5.0000 mg | ORAL_TABLET | Freq: Every day | ORAL | 11 refills | Status: DC

## 2021-02-03 NOTE — Telephone Encounter (Signed)
Advanced Heart Failure Patient Advocate Encounter   Patient was approved to receive Farxiga from [employer]  Patient ID: [id number] Effective dates: 02/02/21 through 03/18/22

## 2021-02-07 NOTE — Telephone Encounter (Signed)
Sent in [employer] application via fax.  Will follow up.  

## 2021-02-20 NOTE — Progress Notes (Signed)
PCP: Pcp, No Cardiology: Dr. Tamala Julian HF Cardiology: Dr. Aundra Dubin  80 y.o. with history of CAD, chronic systolic CHF, CKD stage 3 and atrial fibrillation was referred by Dr. Tamala Julian for evaluation of CHF.  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  She developed ischemic cardiomyopathy, last echo in 1/22 showed EF 25-30%.  She now has a Research officer, political party ICD. Patient also has persistent atrial fibrillation.  She was unable to take amiodarone in the past due to nausea/vomiting.    RHC was done in 10/22, showing mildly elevated right and left heart filling pressures and CI 1.96.  Lasix was increased to 40 mg daily.    She was seen in atrial fibrillation clinic.  Because of CKD, would only be able to take low dose Tikosyn.  Therefore, she has been referred to EP for evaluation for atrial fibrillation ablation.   CPX 11/22 with severe submax effort, but suggests on available data she has moderate to severe functional limitation due to obesity and HF.  Today she returns for HF follow up. She has exertional dyspnea walking on inclines and stairs, but no SOB walking on flat ground. Main issue is fatigue. Denies palpitations, CP, dizziness, edema. Chronically sleeps on 2 pillows at night. Appetite ok. No fever or chills. Weight at home 187 pounds. Taking all medications.   Labs (5/22): K 4.4, creatinine 1.47 Labs (11/22): K 4.1, creatinine 1.83 => 2.17  St Jude device interrogation: unable to interrogate device today as network was down.  PMH: 1. CAD: Anterior MI in 4/21.  LHC with 99% pLAD, 70% pRCA, 80%, OM1, 60% mLCx.  Patient had PCI to pLAD.   2. Chronic systolic CHF: Ischemic cardiomyopathy.  St Jude ICD.  - Echo (1/22): EF 25-30%, RV normal.  - RHC (10/22): mean RA 10, PA 37/16 mean 26, mean PCWP 23, CI 1.96 3. CKD stage 3 4. Type 2 diabetes 5. Hyperlipidemia: Myalgias with Crestor and Lipitor.  6. Atrial fibrillation: Persistent.  - Nausea/vomiting with amiodarone.  7. H/o VT 8.  COVID-19 in 1/22  Social History   Socioeconomic History   Marital status: Married    Spouse name: Not on file   Number of children: Not on file   Years of education: Not on file   Highest education level: Not on file  Occupational History   Not on file  Tobacco Use   Smoking status: Never   Smokeless tobacco: Former    Types: Nurse, children's Use: Never used  Substance and Sexual Activity   Alcohol use: Not Currently   Drug use: Never   Sexual activity: Not on file  Other Topics Concern   Not on file  Social History Narrative   ** Merged History Encounter **       Social Determinants of Health   Financial Resource Strain: Not on file  Food Insecurity: Not on file  Transportation Needs: Not on file  Physical Activity: Not on file  Stress: Not on file  Social Connections: Not on file  Intimate Partner Violence: Not on file   Family History  Problem Relation Age of Onset   Heart disease Mother    ROS: All systems reviewed and negative except as per HPI.   Current Outpatient Medications  Medication Sig Dispense Refill   apixaban (ELIQUIS) 5 MG TABS tablet Take 1 tablet (5 mg total) by mouth daily. 60 tablet 11   clopidogrel (PLAVIX) 75 MG tablet Take 1 tablet (  75 mg total) by mouth daily before breakfast. 90 tablet 1   dapagliflozin propanediol (FARXIGA) 10 MG TABS tablet Take 1 tablet (10 mg total) by mouth daily before breakfast. 30 tablet 3   digoxin (LANOXIN) 0.125 MG tablet Take 1 tablet (0.125 mg total) by mouth 3 (three) times a week. Taking Monday, Wednesday, and Friday. 45 tablet 3   furosemide (LASIX) 20 MG tablet Take 2 tablets (40 mg total) by mouth daily. 120 tablet 3   levothyroxine (SYNTHROID) 25 MCG tablet Take 1 tablet (25 mcg total) by mouth daily before breakfast. 90 tablet 3   metoprolol succinate (TOPROL-XL) 50 MG 24 hr tablet Take 1 tablet (50 mg total) by mouth at bedtime. 90 tablet 3   nitroGLYCERIN (NITROSTAT) 0.4 MG SL tablet Place  1 tablet (0.4 mg total) under the tongue every 5 (five) minutes as needed. 25 tablet 2   potassium chloride (KLOR-CON) 10 MEQ tablet Take 1 tablet (10 mEq total) by mouth 2 (two) times daily. May take 1 extra tablet by mouth for 2-3 pound weight gain overnight, shortness of breath, edema     sacubitril-valsartan (ENTRESTO) 24-26 MG Take 1 tablet by mouth 2 (two) times daily. 180 tablet 3   simvastatin (ZOCOR) 20 MG tablet Take 1 tablet (20 mg total) by mouth every evening. 90 tablet 3   spironolactone (ALDACTONE) 25 MG tablet Take 0.5 tablets (12.5 mg total) by mouth at bedtime. 15 tablet 6   No current facility-administered medications for this encounter.   BP 110/62   Pulse 73   Wt 85.5 kg (188 lb 9.6 oz)   SpO2 98%   BMI 34.50 kg/m   Wt Readings from Last 3 Encounters:  02/21/21 85.5 kg (188 lb 9.6 oz)  01/23/21 82.1 kg (181 lb)  01/19/21 86.4 kg (190 lb 6.4 oz)   PHYSICAL EXAM General:  NAD. No resp difficulty HEENT: Normal Neck: Supple. No JVD. Carotids 2+ bilat; no bruits. No lymphadenopathy or thryomegaly appreciated. Cor: PMI nondisplaced. Irregular rate & rhythm. No rubs, gallops or murmurs. Lungs: Clear Abdomen: Obese, nontender, nondistended. No hepatosplenomegaly. No bruits or masses. Good bowel sounds. Extremities: No cyanosis, clubbing, rash, edema Neuro: Alert & oriented x 3, cranial nerves grossly intact. Moves all 4 extremities w/o difficulty. Affect pleasant.  ASSESSMENT/PLAN 1. CAD: Patient has history of anterior MI in 4/21 with PCI to the proximal LAD.  No chest pain.  - She is on apixaban and Plavix.  Dr. Katrinka Blazing has wanted her to continue Plavix.    - Continue simvastatin 20 mg daily (has had myalgias with other statins).  Needs lipids/LFTs at followup appt.  2. Chronic systolic CHF: Ischemic cardiomyopathy with St Jude ICD.  Last echo in 1/22 with EF 25-30%.  RHC in 10/22 showed low cardiac index at 1.96 and mildly increased filling pressures.  CPX 11/22 with  sub-max effort, but suggests moderate to severe functional limitation due to obesity and HF. She has felt better since increasing Lasix, NYHA class II. She is not significantly volume overloaded on exam.  Creatinine has been trending up 2.33 on last check. - Continue Toprol XL 50 mg qhs. - Continue Entresto 24/26 mg bid.  BMET today. - Continue digoxin, check level today.  - Continue spironolactone 12.5 mg daily. - Continue dapagliflozin 10 mg daily.  No GU symptoms. - Continue Lasix 40 mg daily.  - Narrow QRS, not CRT candidate.  3. Atrial fibrillation: Persistent.  She did not tolerate amiodarone.  AF in the setting  of chronic systolic CHF may certainly be contributing to her symptoms.  Poor candidate for Tikosyn with CKD.  - EP planning for atrial fibrillation ablation 03/2021.  No antiarrhythmic options as above.   - Continue Eliquis. No bleeding issues. CBC today. - CHA2DS2-VASc of at least 5.  4. CKD: Stage 3.  BMET today.   She was given list of PCPs today to establish care with.  Follow up in 2-3 months with Dr. Newman Nip, FNP-BC 02/21/21

## 2021-02-21 ENCOUNTER — Ambulatory Visit (HOSPITAL_COMMUNITY)
Admission: RE | Admit: 2021-02-21 | Discharge: 2021-02-21 | Disposition: A | Discharge status: Home / Self Care | Payer: Medicare Other | Source: Ambulatory Visit | Attending: Family Medicine | Admitting: Family Medicine

## 2021-02-21 ENCOUNTER — Other Ambulatory Visit: Payer: Self-pay

## 2021-02-21 ENCOUNTER — Encounter (HOSPITAL_COMMUNITY): Payer: Self-pay

## 2021-02-21 ENCOUNTER — Other Ambulatory Visit (HOSPITAL_COMMUNITY): Payer: Self-pay | Admitting: Family Medicine

## 2021-02-21 ENCOUNTER — Other Ambulatory Visit (HOSPITAL_COMMUNITY): Payer: Medicare Other

## 2021-02-21 VITALS — BP 110/62 | HR 73 | Wt 188.6 lb

## 2021-02-21 DIAGNOSIS — I5022 Chronic systolic (congestive) heart failure: Secondary | ICD-10-CM

## 2021-02-21 DIAGNOSIS — Z79899 Other long term (current) drug therapy: Secondary | ICD-10-CM | POA: Insufficient documentation

## 2021-02-21 DIAGNOSIS — I255 Ischemic cardiomyopathy: Secondary | ICD-10-CM | POA: Diagnosis not present

## 2021-02-21 DIAGNOSIS — E7800 Pure hypercholesterolemia, unspecified: Secondary | ICD-10-CM | POA: Diagnosis not present

## 2021-02-21 DIAGNOSIS — Z955 Presence of coronary angioplasty implant and graft: Secondary | ICD-10-CM | POA: Insufficient documentation

## 2021-02-21 DIAGNOSIS — I252 Old myocardial infarction: Secondary | ICD-10-CM | POA: Diagnosis not present

## 2021-02-21 DIAGNOSIS — Z8616 Personal history of COVID-19: Secondary | ICD-10-CM | POA: Diagnosis not present

## 2021-02-21 DIAGNOSIS — E785 Hyperlipidemia, unspecified: Secondary | ICD-10-CM

## 2021-02-21 DIAGNOSIS — Z7902 Long term (current) use of antithrombotics/antiplatelets: Secondary | ICD-10-CM | POA: Insufficient documentation

## 2021-02-21 DIAGNOSIS — Z7901 Long term (current) use of anticoagulants: Secondary | ICD-10-CM | POA: Diagnosis not present

## 2021-02-21 DIAGNOSIS — I4819 Other persistent atrial fibrillation: Secondary | ICD-10-CM

## 2021-02-21 DIAGNOSIS — N183 Chronic kidney disease, stage 3 unspecified: Secondary | ICD-10-CM

## 2021-02-21 DIAGNOSIS — Z87891 Personal history of nicotine dependence: Secondary | ICD-10-CM | POA: Diagnosis not present

## 2021-02-21 DIAGNOSIS — I2510 Atherosclerotic heart disease of native coronary artery without angina pectoris: Secondary | ICD-10-CM

## 2021-02-21 LAB — BASIC METABOLIC PANEL
Anion gap: 13 (ref 5–15)
BUN: 29 mg/dL — ABNORMAL HIGH (ref 8–23)
CO2: 25 mmol/L (ref 22–32)
Calcium: 9.7 mg/dL (ref 8.9–10.3)
Chloride: 100 mmol/L (ref 98–111)
Creatinine, Ser: 2.48 mg/dL — ABNORMAL HIGH (ref 0.44–1.00)
GFR, Estimated: 19 mL/min — ABNORMAL LOW (ref >60)
Glucose, Bld: 147 mg/dL — ABNORMAL HIGH (ref 70–99)
Potassium: 4.4 mmol/L (ref 3.5–5.1)
Sodium: 138 mmol/L (ref 135–145)

## 2021-02-21 LAB — CBC
HCT: 39.8 % (ref 36.0–46.0)
Hemoglobin: 12.7 g/dL (ref 12.0–15.0)
MCH: 30.8 pg (ref 26.0–34.0)
MCHC: 31.9 g/dL (ref 30.0–36.0)
MCV: 96.6 fL (ref 80.0–100.0)
Platelets: 203 10*3/uL (ref 150–400)
RBC: 4.12 MIL/uL (ref 3.87–5.11)
RDW: 14.2 % (ref 11.5–15.5)
WBC: 11.9 10*3/uL — ABNORMAL HIGH (ref 4.0–10.5)
nRBC: 0 % (ref 0.0–0.2)

## 2021-02-21 LAB — DIGOXIN LEVEL: Digoxin Level: 1.3 ng/mL (ref 0.8–2.0)

## 2021-02-21 MED ORDER — FUROSEMIDE 20 MG PO TABS: 20.0000 mg | ORAL_TABLET | Freq: Every day | ORAL | 3 refills | Status: DC

## 2021-02-21 MED ORDER — DIGOXIN 125 MCG PO TABS: 0.0625 mg | ORAL_TABLET | ORAL | 3 refills | Status: DC

## 2021-02-21 NOTE — Patient Instructions (Signed)
It was great to see you today! No medication changes are needed at this time.  Labs today We will only contact you if something comes back abnormal or we need to make some changes. Otherwise no news is good news!  Be sure to schedule a follow up with a primary care physician. -see attached papers for details  Your physician recommends that you schedule a follow-up appointment in: 2-3 months with Dr Shirlee Latch  Do the following things EVERYDAY: Weigh yourself in the morning before breakfast. Write it down and keep it in a log. Take your medicines as prescribed Eat low salt foods--Limit salt (sodium) to 2000 mg per day.  Stay as active as you can everyday Limit all fluids for the day to less than 2 liters  At the [facility], you and your health needs are our priority. As part of our continuing mission to provide you with exceptional heart care, we have created designated Provider Care Teams. These Care Teams include your primary Cardiologist (physician) and Advanced Practice Providers (APPs- Physician Assistants and Nurse Practitioners) who all work together to provide you with the care you need, when you need it.   You may see any of the following providers on your designated Care Team at your next follow up: Dr Arvilla Meres Dr Carron Curie, NP Robbie Lis, [facility] [place], [place] Karle Plumber, PharmD   Please be sure to bring in all your medications bottles to every appointment.   If you have any questions or concerns before your next appointment please send [place] a message through [place] or call our office at [phone].    TO LEAVE A MESSAGE FOR THE NURSE SELECT OPTION 2, PLEASE LEAVE A MESSAGE INCLUDING: YOUR NAME DATE OF BIRTH CALL BACK NUMBER REASON FOR CALL**this is important as we prioritize the call backs  YOU WILL RECEIVE A CALL BACK THE SAME DAY AS LONG AS YOU CALL BEFORE 4:00 PM

## 2021-02-28 ENCOUNTER — Other Ambulatory Visit: Payer: Self-pay

## 2021-02-28 ENCOUNTER — Telehealth (HOSPITAL_COMMUNITY): Payer: Self-pay | Admitting: Surgery

## 2021-02-28 ENCOUNTER — Ambulatory Visit (HOSPITAL_COMMUNITY)
Admission: RE | Admit: 2021-02-28 | Discharge: 2021-02-28 | Disposition: A | Discharge status: Home / Self Care | Payer: Medicare Other | Source: Ambulatory Visit | Attending: Internal Medicine | Admitting: Internal Medicine

## 2021-02-28 DIAGNOSIS — I5022 Chronic systolic (congestive) heart failure: Secondary | ICD-10-CM | POA: Insufficient documentation

## 2021-02-28 DIAGNOSIS — E785 Hyperlipidemia, unspecified: Secondary | ICD-10-CM | POA: Diagnosis not present

## 2021-02-28 LAB — LIPID PANEL
Cholesterol: 169 mg/dL (ref 0–200)
HDL: 31 mg/dL — ABNORMAL LOW (ref >40)
LDL Cholesterol: 114 mg/dL — ABNORMAL HIGH (ref 0–99)
Total CHOL/HDL Ratio: 5.5 RATIO
Triglycerides: 120 mg/dL (ref <150)
VLDL: 24 mg/dL (ref 0–40)

## 2021-02-28 LAB — COMPREHENSIVE METABOLIC PANEL
ALT: 12 U/L (ref 0–44)
AST: 15 U/L (ref 15–41)
Albumin: 3.3 g/dL — ABNORMAL LOW (ref 3.5–5.0)
Alkaline Phosphatase: 31 U/L — ABNORMAL LOW (ref 38–126)
Anion gap: 10 (ref 5–15)
BUN: 17 mg/dL (ref 8–23)
CO2: 25 mmol/L (ref 22–32)
Calcium: 9.5 mg/dL (ref 8.9–10.3)
Chloride: 105 mmol/L (ref 98–111)
Creatinine, Ser: 1.95 mg/dL — ABNORMAL HIGH (ref 0.44–1.00)
GFR, Estimated: 26 mL/min — ABNORMAL LOW (ref >60)
Glucose, Bld: 157 mg/dL — ABNORMAL HIGH (ref 70–99)
Potassium: 4.2 mmol/L (ref 3.5–5.1)
Sodium: 140 mmol/L (ref 135–145)
Total Bilirubin: 0.4 mg/dL (ref 0.3–1.2)
Total Protein: 6.7 g/dL (ref 6.5–8.1)

## 2021-02-28 LAB — DIGOXIN LEVEL: Digoxin Level: <0.2 ng/mL — ABNORMAL LOW (ref 0.8–2.0)

## 2021-02-28 NOTE — Telephone Encounter (Signed)
I attempted to reach patient to review results and recommendations per Prince Rome NP.  I was unable to leave a message for a return call.

## 2021-02-28 NOTE — Telephone Encounter (Signed)
-----   Message from Jacklynn Ganong, [place] sent at 02/28/2021  2:39 PM EST ----- Dig level and kidney function improved. OK to continue digoxin 0.0625 mg MWF.   LDL elevated. Has she been consistently taking her Zocor? If so, please increase Zocor to 40 mg daily.

## 2021-03-03 ENCOUNTER — Telehealth (HOSPITAL_COMMUNITY): Payer: Self-pay

## 2021-03-03 NOTE — Telephone Encounter (Signed)
Tried calling patient, no answer,unable to leave message, vm full. Letter mailed to address on file

## 2021-03-03 NOTE — Telephone Encounter (Signed)
Advanced Heart Failure Patient Advocate Encounter  Received communication from [employer] that the patient needs to provide POI in order to continue processing her application. Attempted to call the patient, no way to leave a vm.

## 2021-03-03 NOTE — Telephone Encounter (Signed)
-----   Message from Jacklynn Ganong, [place] sent at 02/28/2021  2:39 PM EST ----- Dig level and kidney function improved. OK to continue digoxin 0.0625 mg MWF.   LDL elevated. Has she been consistently taking her Zocor? If so, please increase Zocor to 40 mg daily.

## 2021-03-13 ENCOUNTER — Other Ambulatory Visit: Payer: Self-pay | Admitting: Internal Medicine

## 2021-03-13 DIAGNOSIS — I4819 Other persistent atrial fibrillation: Secondary | ICD-10-CM

## 2021-03-13 DIAGNOSIS — Z79899 Other long term (current) drug therapy: Secondary | ICD-10-CM

## 2021-03-14 ENCOUNTER — Telehealth: Payer: Self-pay | Admitting: Interventional Cardiology

## 2021-03-14 NOTE — Telephone Encounter (Signed)
Attempted to call Pt.  Call went to voicemail.  Voicemail is full.  Need to advise Pt call PCP or go to urgent care for tx.

## 2021-03-14 NOTE — Telephone Encounter (Signed)
Patient states she has been stopped up, sneezing, and coughing. She states she is afraid the sickness will travel to her lungs and she only has one good lung. She would like to know if Dr. Katrinka Blazing has any recommendations or if medication can be called in for her. Please advise/

## 2021-03-15 NOTE — Telephone Encounter (Signed)
Attempted to call pt.  VM was full.  Unable to leave message.

## 2021-03-21 NOTE — Telephone Encounter (Signed)
Attempted to contact pt.  First time it said call cannot be completed as dialed.  Tried again and received rapid beeping.  Unable to contact pt.

## 2021-03-24 ENCOUNTER — Encounter (HOSPITAL_COMMUNITY): Payer: Self-pay | Admitting: Cardiology

## 2021-03-24 ENCOUNTER — Ambulatory Visit (HOSPITAL_COMMUNITY): Payer: Medicare Other | Attending: Cardiology

## 2021-03-24 ENCOUNTER — Other Ambulatory Visit: Payer: Self-pay

## 2021-03-24 ENCOUNTER — Other Ambulatory Visit: Payer: Medicare Other | Admitting: *Deleted

## 2021-03-24 DIAGNOSIS — I255 Ischemic cardiomyopathy: Secondary | ICD-10-CM | POA: Diagnosis not present

## 2021-03-24 DIAGNOSIS — I2510 Atherosclerotic heart disease of native coronary artery without angina pectoris: Secondary | ICD-10-CM | POA: Diagnosis not present

## 2021-03-24 DIAGNOSIS — E7800 Pure hypercholesterolemia, unspecified: Secondary | ICD-10-CM

## 2021-03-24 DIAGNOSIS — I5021 Acute systolic (congestive) heart failure: Secondary | ICD-10-CM | POA: Insufficient documentation

## 2021-03-24 DIAGNOSIS — I4891 Unspecified atrial fibrillation: Secondary | ICD-10-CM

## 2021-03-24 LAB — ECHOCARDIOGRAM COMPLETE: S' Lateral: 4 cm

## 2021-03-24 MED ORDER — PERFLUTREN LIPID MICROSPHERE
1.0000 mL | INTRAVENOUS | Status: AC | PRN
  Administered 2021-03-24: 2 mL via INTRAVENOUS

## 2021-03-24 NOTE — Progress Notes (Signed)
Attempted to obtain medical history via telephone, unable to reach at this time. Unable to leave voicemail to return pre surgical testing department's phone call,due to mailbox full.  

## 2021-03-25 LAB — HEPATIC FUNCTION PANEL
ALT: 6 IU/L (ref 0–32)
AST: 14 IU/L (ref 0–40)
Albumin: 4.4 g/dL (ref 3.7–4.7)
Alkaline Phosphatase: 35 IU/L — ABNORMAL LOW (ref 44–121)
Bilirubin Total: 0.5 mg/dL (ref 0.0–1.2)
Bilirubin, Direct: 0.16 mg/dL (ref 0.00–0.40)
Total Protein: 7.3 g/dL (ref 6.0–8.5)

## 2021-03-25 LAB — LIPID PANEL
Chol/HDL Ratio: 5.6 ratio — ABNORMAL HIGH (ref 0.0–4.4)
Cholesterol, Total: 201 mg/dL — ABNORMAL HIGH (ref 100–199)
HDL: 36 mg/dL — ABNORMAL LOW (ref >39)
LDL Chol Calc (NIH): 131 mg/dL — ABNORMAL HIGH (ref 0–99)
Triglycerides: 187 mg/dL — ABNORMAL HIGH (ref 0–149)
VLDL Cholesterol Cal: 34 mg/dL (ref 5–40)

## 2021-03-25 LAB — CBC
Hematocrit: 37.1 % (ref 34.0–46.6)
Hemoglobin: 12.2 g/dL (ref 11.1–15.9)
MCH: 30.6 pg (ref 26.6–33.0)
MCHC: 32.9 g/dL (ref 31.5–35.7)
MCV: 93 fL (ref 79–97)
Platelets: 284 10*3/uL (ref 150–450)
RBC: 3.99 x10E6/uL (ref 3.77–5.28)
RDW: 13.5 % (ref 11.7–15.4)
WBC: 12.2 10*3/uL — ABNORMAL HIGH (ref 3.4–10.8)

## 2021-03-25 LAB — BASIC METABOLIC PANEL
BUN/Creatinine Ratio: 9 — ABNORMAL LOW (ref 12–28)
BUN: 15 mg/dL (ref 8–27)
CO2: 23 mmol/L (ref 20–29)
Calcium: 9.8 mg/dL (ref 8.7–10.3)
Chloride: 98 mmol/L (ref 96–106)
Creatinine, Ser: 1.71 mg/dL — ABNORMAL HIGH (ref 0.57–1.00)
Glucose: 176 mg/dL — ABNORMAL HIGH (ref 70–99)
Potassium: 4.4 mmol/L (ref 3.5–5.2)
Sodium: 137 mmol/L (ref 134–144)
eGFR: 30 mL/min/{1.73_m2} — ABNORMAL LOW (ref >59)

## 2021-03-25 LAB — DIGOXIN LEVEL: Digoxin, Serum: <0.4 ng/mL — ABNORMAL LOW (ref 0.5–0.9)

## 2021-03-25 LAB — PRO B NATRIURETIC PEPTIDE: NT-Pro BNP: 3560 pg/mL — ABNORMAL HIGH (ref 0–738)

## 2021-03-29 ENCOUNTER — Encounter (HOSPITAL_COMMUNITY): Payer: Self-pay

## 2021-03-29 NOTE — Progress Notes (Signed)
Letter sent to patient.

## 2021-03-31 ENCOUNTER — Ambulatory Visit (INDEPENDENT_AMBULATORY_CARE_PROVIDER_SITE_OTHER): Payer: Medicare Other | Admitting: Interventional Cardiology

## 2021-03-31 ENCOUNTER — Encounter: Payer: Self-pay | Admitting: Interventional Cardiology

## 2021-03-31 ENCOUNTER — Other Ambulatory Visit: Payer: Self-pay

## 2021-03-31 VITALS — BP 103/55 | HR 100 | Ht 62.0 in | Wt 188.8 lb

## 2021-03-31 DIAGNOSIS — I2510 Atherosclerotic heart disease of native coronary artery without angina pectoris: Secondary | ICD-10-CM | POA: Diagnosis not present

## 2021-03-31 DIAGNOSIS — I5022 Chronic systolic (congestive) heart failure: Secondary | ICD-10-CM | POA: Diagnosis not present

## 2021-03-31 DIAGNOSIS — Z720 Tobacco use: Secondary | ICD-10-CM

## 2021-03-31 DIAGNOSIS — I4819 Other persistent atrial fibrillation: Secondary | ICD-10-CM | POA: Diagnosis not present

## 2021-03-31 DIAGNOSIS — E785 Hyperlipidemia, unspecified: Secondary | ICD-10-CM | POA: Diagnosis not present

## 2021-03-31 DIAGNOSIS — N183 Chronic kidney disease, stage 3 unspecified: Secondary | ICD-10-CM | POA: Diagnosis not present

## 2021-03-31 MED ORDER — METOPROLOL SUCCINATE ER 50 MG PO TB24: 50.0000 mg | ORAL_TABLET | Freq: Every day | ORAL | 3 refills | Status: DC

## 2021-03-31 MED ORDER — DIGOXIN 125 MCG PO TABS: 0.0625 mg | ORAL_TABLET | ORAL | 3 refills | Status: DC

## 2021-03-31 MED ORDER — CLOPIDOGREL BISULFATE 75 MG PO TABS: 75.0000 mg | ORAL_TABLET | Freq: Every day | ORAL | 1 refills | Status: DC

## 2021-03-31 NOTE — H&P (View-Only) (Signed)
Cardiology Office Note:    Date:  03/31/2021   ID:  Terry Hanson, DOB March 24, 1940, MRN [id number]  PCP:  Pcp, No  Cardiologist:  Sinclair Grooms, MD   Referring MD: Lucia Gaskins, MD   Chief Complaint  Patient presents with   Congestive Heart Failure    History of Present Illness:    Terry Hanson is a 81 y.o. female with a hx of CAD, chronic systolic CHF, CKD stage 3 and atrial fibrillation was referred by Dr. Tamala Julian for evaluation of CHF.  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  She developed ischemic cardiomyopathy, last echo in 1/22 showed EF 25-30%.  She now has a Research officer, political party ICD. Patient also has persistent atrial fibrillation.  She was unable to take amiodarone in the past due to nausea/vomiting.     She ran out of digoxin.  We will refill it today.  She is started taking many of her therapies on a once a day at bedtime rather than in the morning.  States this helps her to feel better.  She takes Eliquis twice a day, Farxiga in the morning, digoxin in the morning, and Entresto.  She denies orthopnea PND.  Overall feels better.  Appetite is improving.  Past Medical History:  Diagnosis Date   Arthritis    Asthma    CHF (congestive heart failure) (HCC)    Diabetes mellitus ([place])    Diabetes mellitus without complication ([place])    Enlarged heart    Hypercholesterolemia    Hyperlipidemia    Ischemic cardiomyopathy    a. EF 30-35% by echo in 06/2019   STEMI (ST elevation myocardial infarction) ([place])    a. 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    Past Surgical History:  Procedure Laterality Date   ABDOMINAL HYSTERECTOMY     CATARACT EXTRACTION W/PHACO Right 06/27/2020   Procedure: CATARACT EXTRACTION PHACO AND INTRAOCULAR LENS PLACEMENT RIGHT EYE AND PLACEMENT OF CORTICOSTEROID;  Surgeon: Baruch Goldmann, MD;  Location: [facility];  Service: Ophthalmology;  Laterality: Right;  right CDE=25.47   CORONARY/GRAFT ACUTE MI REVASCULARIZATION N/A 06/29/2019    Procedure: Coronary/Graft Acute MI Revascularization;  Surgeon: Belva Crome, MD;  Location: [place] [facility];  Service: Cardiovascular;  Laterality: N/A;   ICD IMPLANT N/A 12/14/2019   Procedure: ICD IMPLANT;  Surgeon: Evans Lance, MD;  Location: [place] [facility];  Service: Cardiovascular;  Laterality: N/A;   LEFT HEART CATH AND CORONARY ANGIOGRAPHY N/A 06/29/2019   Procedure: LEFT HEART CATH AND CORONARY ANGIOGRAPHY;  Surgeon: Belva Crome, MD;  Location: [place] [facility];  Service: Cardiovascular;  Laterality: N/A;   RIGHT HEART CATH N/A 12/30/2020   Procedure: RIGHT HEART CATH;  Surgeon: Larey Dresser, MD;  Location: [place] [facility];  Service: Cardiovascular;  Laterality: N/A;    Current Medications: Current Meds  Medication Sig   apixaban (ELIQUIS) 5 MG TABS tablet Take 1 tablet (5 mg total) by mouth daily.   dapagliflozin propanediol (FARXIGA) 10 MG TABS tablet Take 1 tablet (10 mg total) by mouth daily before breakfast.   furosemide (LASIX) 20 MG tablet Take 1 tablet (20 mg total) by mouth daily.   levothyroxine (SYNTHROID) 25 MCG tablet Take 1 tablet (25 mcg total) by mouth daily before breakfast.   nitroGLYCERIN (NITROSTAT) 0.4 MG SL tablet Place 1 tablet (0.4 mg total) under the tongue every 5 (five) minutes as needed.   potassium chloride (  KLOR-CON) 10 MEQ tablet Take 1 tablet (10 mEq total) by mouth 2 (two) times daily. May take 1 extra tablet by mouth for 2-3 pound weight gain overnight, shortness of breath, edema   sacubitril-valsartan (ENTRESTO) 24-26 MG Take 1 tablet by mouth 2 (two) times daily.   simvastatin (ZOCOR) 20 MG tablet Take 1 tablet (20 mg total) by mouth every evening.   spironolactone (ALDACTONE) 25 MG tablet Take 0.5 tablets (12.5 mg total) by mouth at bedtime.   [DISCONTINUED] clopidogrel (PLAVIX) 75 MG tablet Take 1 tablet (75 mg total) by mouth daily before breakfast.   [DISCONTINUED] digoxin (LANOXIN) 0.125 MG tablet Take 0.5 tablets  (0.0625 mg total) by mouth 3 (three) times a week. Taking Monday, Wednesday, and Friday.   [DISCONTINUED] metoprolol succinate (TOPROL-XL) 50 MG 24 hr tablet Take 1 tablet (50 mg total) by mouth at bedtime.     Allergies:   Codeine, Shrimp [shellfish allergy], Amiodarone, Penicillins, and Sulfa antibiotics   Social History   Socioeconomic History   Marital status: Married    Spouse name: Not on file   Number of children: Not on file   Years of education: Not on file   Highest education level: Not on file  Occupational History   Not on file  Tobacco Use   Smoking status: Never   Smokeless tobacco: Former    Types: Nurse, children's Use: Never used  Substance and Sexual Activity   Alcohol use: Not Currently   Drug use: Never   Sexual activity: Not on file  Other Topics Concern   Not on file  Social History Narrative   ** Merged History Encounter **       Social Determinants of Health   Financial Resource Strain: Not on file  Food Insecurity: Not on file  Transportation Needs: Not on file  Physical Activity: Not on file  Stress: Not on file  Social Connections: Not on file     Family History: The patient's family history includes Heart disease in her mother.  ROS:   Please see the history of present illness.    She denies syncope.  She does have nocturia.  All other systems reviewed and are negative.  EKGs/Labs/Other Studies Reviewed:    The following studies were reviewed today:  Echocardiogram January 2023 IMPRESSIONS     1. Left ventricular ejection fraction, by estimation, is 25 to 30%. The  left ventricle has severely decreased function. The left ventricle  demonstrates regional wall motion abnormalities (see scoring  diagram/findings for description). All septal and  anterior LV segments and the apex are akinetic. All inferior LV segments  and the apical lateral segment is hypokinetic. Diastolic function  indeterminant due to Afib.   2. Right  ventricular systolic function is normal. The right ventricular  size is normal.   3. The mitral valve is abnormal. Mild mitral valve regurgitation.   4. The aortic valve is tricuspid. There is moderate calcification of the  aortic valve. There is moderate thickening of the aortic valve. Aortic  valve regurgitation is trivial. Aortic valve sclerosis/calcification is  present, without any evidence of  aortic stenosis.   5. The inferior vena cava is normal in size with greater than 50%  respiratory variability, suggesting right atrial pressure of 3 mmHg.   Comparison(s): Compared to prior study on 09/23/2019, EF on current study is 25-30% and was previously 30-35%.   EKG:  EKG no new data  Recent Labs: 01/05/2021: Magnesium 2.4; TSH  2.086 01/19/2021: B Natriuretic Peptide 565.8 03/24/2021: ALT 6; BUN 15; Creatinine, Ser 1.71; Hemoglobin 12.2; NT-Pro BNP 3,560; Platelets 284; Potassium 4.4; Sodium 137  Recent Lipid Panel    Component Value Date/Time   CHOL 201 (H) 03/24/2021 0754   TRIG 187 (H) 03/24/2021 0754   HDL 36 (L) 03/24/2021 0754   CHOLHDL 5.6 (H) 03/24/2021 0754   CHOLHDL 5.5 02/28/2021 1110   VLDL 24 02/28/2021 1110   LDLCALC 131 (H) 03/24/2021 0754    Physical Exam:    VS:  BP (!) 103/55    Pulse 100    Ht 5\' 2"  (1.575 m)    Wt 188 lb 12.8 oz (85.6 kg)    SpO2 98%    BMI 34.53 kg/m     Wt Readings from Last 3 Encounters:  03/31/21 188 lb 12.8 oz (85.6 kg)  02/21/21 188 lb 9.6 oz (85.5 kg)  01/23/21 181 lb (82.1 kg)     GEN: Weight is stable since December.  7 pounds above weight in November.. No acute distress HEENT: Normal NECK: No JVD. LYMPHATICS: No lymphadenopathy CARDIAC: No murmur.  Irregularly irregular RR no gallop, or edema. VASCULAR:  Normal Pulses. No bruits. RESPIRATORY:  Clear to auscultation without rales, wheezing or rhonchi  ABDOMEN: Soft, non-tender, non-distended, No pulsatile mass, MUSCULOSKELETAL: No deformity  SKIN: Warm and  dry NEUROLOGIC:  Alert and oriented x 3 PSYCHIATRIC:  Normal affect   ASSESSMENT:    1. Persistent atrial fibrillation ([place])   2. Hyperlipidemia, unspecified hyperlipidemia type   3. Chronic systolic CHF (congestive heart failure) ([place])   4. Coronary artery disease involving native coronary artery of native heart without angina pectoris   5. Stage 3 chronic kidney disease, unspecified whether stage 3a or 3b CKD ([place])   6. Tobacco abuse    PLAN:    In order of problems listed above:  Rate control is poor.  Has been off Lanoxin for several days.  Will resume therapy.  New prescription sent today.  She will be having A. fib ablation by Dr. Curt Bears soon.  Establishing sinus rhythm will likely help improve LV systolic function is rate controlled has been a problem. Continue Zocor 20 mg/day Continue Lanoxin, Farxiga, Toprol-XL, Aldactone, Entresto. Secondary prevention discussed. Most recent creatinine was 1.71 January 6.  Potassium was 4.4. Smoking cessation is discussed.   Guideline directed therapy for left ventricular systolic dysfunction: Angiotensin receptor-neprilysin inhibitor (ARNI)-Entresto; beta-blocker therapy - carvedilol, metoprolol succinate, or bisoprolol; mineralocorticoid receptor antagonist (MRA) therapy -spironolactone or eplerenone.  SGLT-2 agents -  Dapagliflozin Wilder Glade) or Empagliflozin (Jardiance).These therapies have been shown to improve clinical outcomes including reduction of rehospitalization, survival, and acute heart failure.      Medication Adjustments/Labs and Tests Ordered: Current medicines are reviewed at length with the patient today.  Concerns regarding medicines are outlined above.  No orders of the defined types were placed in this encounter.  Meds ordered this encounter  Medications   clopidogrel (PLAVIX) 75 MG tablet    Sig: Take 1 tablet (75 mg total) by mouth daily before breakfast.    Dispense:  90 tablet    Refill:  1   digoxin  (LANOXIN) 0.125 MG tablet    Sig: Take 0.5 tablets (0.0625 mg total) by mouth 3 (three) times a week. Taking Monday, Wednesday, and Friday.    Dispense:  45 tablet    Refill:  3   metoprolol succinate (TOPROL-XL) 50 MG 24 hr tablet    Sig: Take 1 tablet (  50 mg total) by mouth at bedtime.    Dispense:  90 tablet    Refill:  3    Please cancel all previous orders for current medication. Change in dosage or pill size.    There are no Patient Instructions on file for this visit.   Signed, Sinclair Grooms, MD  03/31/2021 2:49 PM    [place]

## 2021-03-31 NOTE — Progress Notes (Signed)
Cardiology Office Note:    Date:  03/31/2021   ID:  Terry Hanson, DOB 1941/02/19, MRN [id number]  PCP:  Pcp, No  Cardiologist:  Sinclair Grooms, MD   Referring MD: Lucia Gaskins, MD   Chief Complaint  Patient presents with   Congestive Heart Failure    History of Present Illness:    [name] is a 81 y.o. female with a hx of CAD, chronic systolic CHF, CKD stage 3 and atrial fibrillation was referred by Dr. Tamala Julian for evaluation of CHF.  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  She developed ischemic cardiomyopathy, last echo in 1/22 showed EF 25-30%.  She now has a Research officer, political party ICD. Patient also has persistent atrial fibrillation.  She was unable to take amiodarone in the past due to nausea/vomiting.     She ran out of digoxin.  We will refill it today.  She is started taking many of her therapies on a once a day at bedtime rather than in the morning.  States this helps her to feel better.  She takes Eliquis twice a day, Farxiga in the morning, digoxin in the morning, and Entresto.  She denies orthopnea PND.  Overall feels better.  Appetite is improving.  Past Medical History:  Diagnosis Date   Arthritis    Asthma    CHF (congestive heart failure) (HCC)    Diabetes mellitus ([place])    Diabetes mellitus without complication ([place])    Enlarged heart    Hypercholesterolemia    Hyperlipidemia    Ischemic cardiomyopathy    a. EF 30-35% by echo in 06/2019   STEMI (ST elevation myocardial infarction) ([place])    a. 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    Past Surgical History:  Procedure Laterality Date   ABDOMINAL HYSTERECTOMY     CATARACT EXTRACTION W/PHACO Right 06/27/2020   Procedure: CATARACT EXTRACTION PHACO AND INTRAOCULAR LENS PLACEMENT RIGHT EYE AND PLACEMENT OF CORTICOSTEROID;  Surgeon: Baruch Goldmann, MD;  Location: [facility];  Service: Ophthalmology;  Laterality: Right;  right CDE=25.47   CORONARY/GRAFT ACUTE MI REVASCULARIZATION N/A 06/29/2019    Procedure: Coronary/Graft Acute MI Revascularization;  Surgeon: Belva Crome, MD;  Location: [place];  Service: Cardiovascular;  Laterality: N/A;   ICD IMPLANT N/A 12/14/2019   Procedure: ICD IMPLANT;  Surgeon: Evans Lance, MD;  Location: [place];  Service: Cardiovascular;  Laterality: N/A;   LEFT HEART CATH AND CORONARY ANGIOGRAPHY N/A 06/29/2019   Procedure: LEFT HEART CATH AND CORONARY ANGIOGRAPHY;  Surgeon: Belva Crome, MD;  Location: [facility] [facility];  Service: Cardiovascular;  Laterality: N/A;   RIGHT HEART CATH N/A 12/30/2020   Procedure: RIGHT HEART CATH;  Surgeon: Larey Dresser, MD;  Location: [place] [facility];  Service: Cardiovascular;  Laterality: N/A;    Current Medications: Current Meds  Medication Sig   apixaban (ELIQUIS) 5 MG TABS tablet Take 1 tablet (5 mg total) by mouth daily.   dapagliflozin propanediol (FARXIGA) 10 MG TABS tablet Take 1 tablet (10 mg total) by mouth daily before breakfast.   furosemide (LASIX) 20 MG tablet Take 1 tablet (20 mg total) by mouth daily.   levothyroxine (SYNTHROID) 25 MCG tablet Take 1 tablet (25 mcg total) by mouth daily before breakfast.   nitroGLYCERIN (NITROSTAT) 0.4 MG SL tablet Place 1 tablet (0.4 mg total) under the tongue every 5 (five) minutes as needed.   potassium chloride (  KLOR-CON) 10 MEQ tablet Take 1 tablet (10 mEq total) by mouth 2 (two) times daily. May take 1 extra tablet by mouth for 2-3 pound weight gain overnight, shortness of breath, edema  ° sacubitril-valsartan (ENTRESTO) 24-26 MG Take 1 tablet by mouth 2 (two) times daily.  ° simvastatin (ZOCOR) 20 MG tablet Take 1 tablet (20 mg total) by mouth every evening.  ° spironolactone (ALDACTONE) 25 MG tablet Take 0.5 tablets (12.5 mg total) by mouth at bedtime.  ° [DISCONTINUED] clopidogrel (PLAVIX) 75 MG tablet Take 1 tablet (75 mg total) by mouth daily before breakfast.  ° [DISCONTINUED] digoxin (LANOXIN) 0.125 MG tablet Take 0.5 tablets  (0.0625 mg total) by mouth 3 (three) times a week. Taking Monday, Wednesday, and Friday.  ° [DISCONTINUED] metoprolol succinate (TOPROL-XL) 50 MG 24 hr tablet Take 1 tablet (50 mg total) by mouth at bedtime.  °  ° °Allergies:   Codeine, Shrimp [shellfish allergy], Amiodarone, Penicillins, and Sulfa antibiotics  ° °Social History  ° °Socioeconomic History  ° Marital status: Married  °  Spouse name: Not on file  ° Number of children: Not on file  ° Years of education: Not on file  ° Highest education level: Not on file  °Occupational History  ° Not on file  °Tobacco Use  ° Smoking status: Never  ° Smokeless tobacco: Former  °  Types: Chew  °Vaping Use  ° Vaping Use: Never used  °Substance and Sexual Activity  ° Alcohol use: Not Currently  ° Drug use: Never  ° Sexual activity: Not on file  °Other Topics Concern  ° Not on file  °Social History Narrative  ° ** Merged History Encounter **  °    ° °Social Determinants of Health  ° °Financial Resource Strain: Not on file  °Food Insecurity: Not on file  °Transportation Needs: Not on file  °Physical Activity: Not on file  °Stress: Not on file  °Social Connections: Not on file  °  ° °Family History: °The patient's family history includes Heart disease in her mother. ° °ROS:   °Please see the history of present illness.    °She denies syncope.  She does have nocturia.  All other systems reviewed and are negative. ° °EKGs/Labs/Other Studies Reviewed:   ° °The following studies were reviewed today: ° °Echocardiogram January 2023 °IMPRESSIONS  ° ° ° 1. Left ventricular ejection fraction, by estimation, is 25 to 30%. The  °left ventricle has severely decreased function. The left ventricle  °demonstrates regional wall motion abnormalities (see scoring  °diagram/findings for description). All septal and  °anterior LV segments and the apex are akinetic. All inferior LV segments  °and the apical lateral segment is hypokinetic. Diastolic function  °indeterminant due to Afib.  ° 2. Right  ventricular systolic function is normal. The right ventricular  °size is normal.  ° 3. The mitral valve is abnormal. Mild mitral valve regurgitation.  ° 4. The aortic valve is tricuspid. There is moderate calcification of the  °aortic valve. There is moderate thickening of the aortic valve. Aortic  °valve regurgitation is trivial. Aortic valve sclerosis/calcification is  °present, without any evidence of  °aortic stenosis.  ° 5. The inferior vena cava is normal in size with greater than 50%  °respiratory variability, suggesting right atrial pressure of 3 mmHg.  ° °Comparison(s): Compared to prior study on 09/23/2019, EF on current study is 25-30% and was previously 30-35%.  ° °EKG:  EKG no new data ° °Recent Labs: °01/05/2021: Magnesium 2.4; TSH   2.086 01/19/2021: B Natriuretic Peptide 565.8 03/24/2021: ALT 6; BUN 15; Creatinine, Ser 1.71; Hemoglobin 12.2; NT-Pro BNP 3,560; Platelets 284; Potassium 4.4; Sodium 137  Recent Lipid Panel    Component Value Date/Time   CHOL 201 (H) 03/24/2021 0754   TRIG 187 (H) 03/24/2021 0754   HDL 36 (L) 03/24/2021 0754   CHOLHDL 5.6 (H) 03/24/2021 0754   CHOLHDL 5.5 02/28/2021 1110   VLDL 24 02/28/2021 1110   LDLCALC 131 (H) 03/24/2021 0754    Physical Exam:    VS:  BP (!) 103/55    Pulse 100    Ht 5\' 2"  (1.575 m)    Wt 188 lb 12.8 oz (85.6 kg)    SpO2 98%    BMI 34.53 kg/m     Wt Readings from Last 3 Encounters:  03/31/21 188 lb 12.8 oz (85.6 kg)  02/21/21 188 lb 9.6 oz (85.5 kg)  01/23/21 181 lb (82.1 kg)     GEN: Weight is stable since December.  7 pounds above weight in November.. No acute distress HEENT: Normal NECK: No JVD. LYMPHATICS: No lymphadenopathy CARDIAC: No murmur.  Irregularly irregular RR no gallop, or edema. VASCULAR:  Normal Pulses. No bruits. RESPIRATORY:  Clear to auscultation without rales, wheezing or rhonchi  ABDOMEN: Soft, non-tender, non-distended, No pulsatile mass, MUSCULOSKELETAL: No deformity  SKIN: Warm and  dry NEUROLOGIC:  Alert and oriented x 3 PSYCHIATRIC:  Normal affect   ASSESSMENT:    1. Persistent atrial fibrillation ([place])   2. Hyperlipidemia, unspecified hyperlipidemia type   3. Chronic systolic CHF (congestive heart failure) ([place])   4. Coronary artery disease involving native coronary artery of native heart without angina pectoris   5. Stage 3 chronic kidney disease, unspecified whether stage 3a or 3b CKD ([place])   6. Tobacco abuse    PLAN:    In order of problems listed above:  Rate control is poor.  Has been off Lanoxin for several days.  Will resume therapy.  New prescription sent today.  She will be having A. fib ablation by Dr. Curt Bears soon.  Establishing sinus rhythm will likely help improve LV systolic function is rate controlled has been a problem. Continue Zocor 20 mg/day Continue Lanoxin, Farxiga, Toprol-XL, Aldactone, Entresto. Secondary prevention discussed. Most recent creatinine was 1.71 January 6.  Potassium was 4.4. Smoking cessation is discussed.   Guideline directed therapy for left ventricular systolic dysfunction: Angiotensin receptor-neprilysin inhibitor (ARNI)-Entresto; beta-blocker therapy - carvedilol, metoprolol succinate, or bisoprolol; mineralocorticoid receptor antagonist (MRA) therapy -spironolactone or eplerenone.  SGLT-2 agents -  Dapagliflozin Wilder Glade) or Empagliflozin (Jardiance).These therapies have been shown to improve clinical outcomes including reduction of rehospitalization, survival, and acute heart failure.      Medication Adjustments/Labs and Tests Ordered: Current medicines are reviewed at length with the patient today.  Concerns regarding medicines are outlined above.  No orders of the defined types were placed in this encounter.  Meds ordered this encounter  Medications   clopidogrel (PLAVIX) 75 MG tablet    Sig: Take 1 tablet (75 mg total) by mouth daily before breakfast.    Dispense:  90 tablet    Refill:  1   digoxin  (LANOXIN) 0.125 MG tablet    Sig: Take 0.5 tablets (0.0625 mg total) by mouth 3 (three) times a week. Taking Monday, Wednesday, and Friday.    Dispense:  45 tablet    Refill:  3   metoprolol succinate (TOPROL-XL) 50 MG 24 hr tablet    Sig: Take 1 tablet (  50 mg total) by mouth at bedtime.    Dispense:  90 tablet    Refill:  3    Please cancel all previous orders for current medication. Change in dosage or pill size.    There are no Patient Instructions on file for this visit.   Signed, Sinclair Grooms, MD  03/31/2021 2:49 PM    [place]

## 2021-03-31 NOTE — Patient Instructions (Signed)
Medication Instructions:  Your physician recommends that you continue on your current medications as directed. Please refer to the Current Medication list given to you today.  *If you need a refill on your cardiac medications before your next appointment, please call your pharmacy*   Lab Work: None If you have labs (blood work) drawn today and your tests are completely normal, you will receive your results only by: MyChart Message (if you have MyChart) OR A paper copy in the mail If you have any lab test that is abnormal or we need to change your treatment, we will call you to review the results.   Testing/Procedures: None   Follow-Up: At [facility], you and your health needs are our priority.  As part of our continuing mission to provide you with exceptional heart care, we have created designated Provider Care Teams.  These Care Teams include your primary Cardiologist (physician) and Advanced Practice Providers (APPs -  Physician Assistants and Nurse Practitioners) who all work together to provide you with the care you need, when you need it.  We recommend signing up for the patient portal called "MyChart".  Sign up information is provided on this After Visit Summary.  MyChart is used to connect with patients for Virtual Visits (Telemedicine).  Patients are able to view lab/test results, encounter notes, upcoming appointments, etc.  Non-urgent messages can be sent to your provider as well.   To learn more about what you can do with MyChart, go to [email or web address].    Your next appointment:   4-6 month(s)  The format for your next appointment:   In Person  Provider:   Henry W Smith III, MD     Other Instructions   

## 2021-04-04 ENCOUNTER — Ambulatory Visit (HOSPITAL_BASED_OUTPATIENT_CLINIC_OR_DEPARTMENT_OTHER): Payer: Medicare Other

## 2021-04-04 ENCOUNTER — Ambulatory Visit (HOSPITAL_COMMUNITY)
Admission: RE | Admit: 2021-04-04 | Discharge: 2021-04-04 | Disposition: A | Discharge status: Home / Self Care | Payer: Medicare Other | Attending: Cardiology | Admitting: Cardiology

## 2021-04-04 ENCOUNTER — Encounter (HOSPITAL_COMMUNITY)
Admission: RE | Disposition: A | Discharge status: Home / Self Care | Payer: Medicare Other | Source: Home / Self Care | Attending: Cardiology

## 2021-04-04 ENCOUNTER — Ambulatory Visit (HOSPITAL_COMMUNITY): Payer: Medicare Other | Admitting: Certified Registered Nurse Anesthetist

## 2021-04-04 ENCOUNTER — Encounter (HOSPITAL_COMMUNITY): Payer: Self-pay | Admitting: Cardiology

## 2021-04-04 ENCOUNTER — Ambulatory Visit (INDEPENDENT_AMBULATORY_CARE_PROVIDER_SITE_OTHER): Payer: Medicare Other

## 2021-04-04 DIAGNOSIS — Z955 Presence of coronary angioplasty implant and graft: Secondary | ICD-10-CM | POA: Diagnosis not present

## 2021-04-04 DIAGNOSIS — Z95810 Presence of automatic (implantable) cardiac defibrillator: Secondary | ICD-10-CM | POA: Diagnosis not present

## 2021-04-04 DIAGNOSIS — Z79899 Other long term (current) drug therapy: Secondary | ICD-10-CM | POA: Insufficient documentation

## 2021-04-04 DIAGNOSIS — I5022 Chronic systolic (congestive) heart failure: Secondary | ICD-10-CM | POA: Diagnosis not present

## 2021-04-04 DIAGNOSIS — I110 Hypertensive heart disease with heart failure: Secondary | ICD-10-CM | POA: Diagnosis not present

## 2021-04-04 DIAGNOSIS — I255 Ischemic cardiomyopathy: Secondary | ICD-10-CM | POA: Insufficient documentation

## 2021-04-04 DIAGNOSIS — I509 Heart failure, unspecified: Secondary | ICD-10-CM | POA: Diagnosis not present

## 2021-04-04 DIAGNOSIS — E1122 Type 2 diabetes mellitus with diabetic chronic kidney disease: Secondary | ICD-10-CM | POA: Insufficient documentation

## 2021-04-04 DIAGNOSIS — Z7901 Long term (current) use of anticoagulants: Secondary | ICD-10-CM | POA: Insufficient documentation

## 2021-04-04 DIAGNOSIS — Z7984 Long term (current) use of oral hypoglycemic drugs: Secondary | ICD-10-CM | POA: Diagnosis not present

## 2021-04-04 DIAGNOSIS — E785 Hyperlipidemia, unspecified: Secondary | ICD-10-CM | POA: Diagnosis not present

## 2021-04-04 DIAGNOSIS — I4819 Other persistent atrial fibrillation: Secondary | ICD-10-CM | POA: Diagnosis not present

## 2021-04-04 DIAGNOSIS — I081 Rheumatic disorders of both mitral and tricuspid valves: Secondary | ICD-10-CM | POA: Diagnosis not present

## 2021-04-04 DIAGNOSIS — I340 Nonrheumatic mitral (valve) insufficiency: Secondary | ICD-10-CM | POA: Diagnosis not present

## 2021-04-04 DIAGNOSIS — N183 Chronic kidney disease, stage 3 unspecified: Secondary | ICD-10-CM | POA: Diagnosis not present

## 2021-04-04 DIAGNOSIS — I2510 Atherosclerotic heart disease of native coronary artery without angina pectoris: Secondary | ICD-10-CM | POA: Diagnosis not present

## 2021-04-04 DIAGNOSIS — I252 Old myocardial infarction: Secondary | ICD-10-CM | POA: Insufficient documentation

## 2021-04-04 DIAGNOSIS — I4891 Unspecified atrial fibrillation: Secondary | ICD-10-CM

## 2021-04-04 DIAGNOSIS — I083 Combined rheumatic disorders of mitral, aortic and tricuspid valves: Secondary | ICD-10-CM | POA: Diagnosis not present

## 2021-04-04 HISTORY — PX: TEE WITHOUT CARDIOVERSION: SHX5443

## 2021-04-04 LAB — CUP PACEART REMOTE DEVICE CHECK
Battery Remaining Longevity: 106 mo
Battery Remaining Percentage: 87 %
Battery Voltage: 2.99 V
Brady Statistic RV Percent Paced: 1 %
Date Time Interrogation Session: 20230117013523
HighPow Impedance: 78 Ohm
Implantable Lead Implant Date: 20210927
Implantable Lead Location: 753860
Implantable Pulse Generator Implant Date: 20210927
Lead Channel Impedance Value: 410 Ohm
Lead Channel Pacing Threshold Amplitude: 0.75 V
Lead Channel Pacing Threshold Pulse Width: 0.5 ms
Lead Channel Sensing Intrinsic Amplitude: 11.6 mV
Lead Channel Setting Pacing Amplitude: 2.5 V
Lead Channel Setting Pacing Pulse Width: 0.5 ms
Lead Channel Setting Sensing Sensitivity: 0.5 mV
Pulse Gen Serial Number: 810006603

## 2021-04-04 LAB — ECHO TEE
AV Mean grad: 4 mmHg
AV Peak grad: 6.3 mmHg
Ao pk vel: 1.25 m/s

## 2021-04-04 LAB — GLUCOSE, CAPILLARY: Glucose-Capillary: 118 mg/dL — ABNORMAL HIGH (ref 70–99)

## 2021-04-04 SURGERY — ECHOCARDIOGRAM, TRANSESOPHAGEAL
Anesthesia: Monitor Anesthesia Care

## 2021-04-04 MED ORDER — LIDOCAINE 2% (20 MG/ML) 5 ML SYRINGE
INTRAMUSCULAR | Status: DC | PRN
  Administered 2021-04-04: 60 mg via INTRAVENOUS

## 2021-04-04 MED ORDER — SODIUM CHLORIDE 0.9 % IV SOLN
INTRAVENOUS | Status: AC | PRN
  Administered 2021-04-04: 500 mL via INTRAMUSCULAR

## 2021-04-04 MED ORDER — PROPOFOL 10 MG/ML IV BOLUS
INTRAVENOUS | Status: DC | PRN
  Administered 2021-04-04 (×2): 10 mg via INTRAVENOUS

## 2021-04-04 MED ORDER — PROPOFOL 500 MG/50ML IV EMUL
INTRAVENOUS | Status: DC | PRN
  Administered 2021-04-04: 125 ug/kg/min via INTRAVENOUS

## 2021-04-04 MED ORDER — SODIUM CHLORIDE 0.9 % IV SOLN: INTRAVENOUS | Status: DC

## 2021-04-04 MED ORDER — PHENYLEPHRINE 40 MCG/ML (10ML) SYRINGE FOR IV PUSH (FOR BLOOD PRESSURE SUPPORT)
PREFILLED_SYRINGE | INTRAVENOUS | Status: DC | PRN
  Administered 2021-04-04 (×4): 80 ug via INTRAVENOUS

## 2021-04-04 NOTE — Anesthesia Postprocedure Evaluation (Signed)
Anesthesia Post Note  Patient: Terry Hanson  Procedure(s) Performed: TRANSESOPHAGEAL ECHOCARDIOGRAM (TEE)     Patient location during evaluation: PACU Anesthesia Type: MAC Level of consciousness: awake and alert Pain management: pain level controlled Vital Signs Assessment: post-procedure vital signs reviewed and stable Respiratory status: spontaneous breathing, nonlabored ventilation, respiratory function stable and patient connected to nasal cannula oxygen Cardiovascular status: stable and blood pressure returned to baseline Postop Assessment: no apparent nausea or vomiting Anesthetic complications: no   No notable events documented.  Last Vitals:  Vitals:   04/04/21 1033 04/04/21 1045  BP: 98/69 (!) 96/53  Pulse: 99 86  Resp: (!) 23 20  Temp:  36.5 C  SpO2: 100% 100%    Last Pain:  Vitals:   04/04/21 1045  TempSrc:   PainSc: 0-No pain                 Tiajuana Amass

## 2021-04-04 NOTE — Anesthesia Preprocedure Evaluation (Addendum)
Anesthesia Evaluation  Patient identified by MRN, date of birth, ID band Patient awake    Reviewed: Allergy & Precautions, NPO status , Patient's Chart, lab work & pertinent test results, reviewed documented beta blocker date and time   Airway Mallampati: III  TM Distance: >3 FB Neck ROM: Full    Dental  (+) Dental Advisory Given, Lower Dentures, Upper Dentures   Pulmonary asthma ,    Pulmonary exam normal breath sounds clear to auscultation       Cardiovascular hypertension, Pt. on medications and Pt. on home beta blockers (-) angina+ CAD, + Past MI, + Cardiac Stents (DES to LAD) and +CHF  + dysrhythmias Atrial Fibrillation + Cardiac Defibrillator  Rhythm:Irregular Rate:Abnormal  TEE 04/04/21: 1. Left ventricular ejection fraction, by estimation, is 25 to 30%. The left ventricle has severely decreased function. The left ventricle has no regional wall motion abnormalities.  2. Right ventricular systolic function is normal. The right ventricular size is normal.  3. No left atrial/left atrial appendage thrombus was detected.  4. The mitral valve is normal in structure. Mild mitral valve  regurgitation. No evidence of mitral stenosis.  5. Tricuspid valve regurgitation is moderate.  6. The aortic valve is normal in structure. There is moderate calcification of the aortic valve. There is mild thickening of the aortic valve. Aortic valve regurgitation is trivial. Aortic valve sclerosis/calcification is present, without any evidence of aortic stenosis. Aortic valve mean gradient measures 4.0 mmHg. Aortic valve Vmax measures 1.25 m/s.  7. The inferior vena cava is normal in size with greater than 50% respiratory variability, suggesting right atrial pressure of 3 mmHg.    Neuro/Psych negative neurological ROS  negative psych ROS   GI/Hepatic negative GI ROS, Neg liver ROS,   Endo/Other  diabetes, Type 2, Oral Hypoglycemic  AgentsObesity   Renal/GU Renal InsufficiencyRenal disease     Musculoskeletal  (+) Arthritis , Osteoarthritis,    Abdominal   Peds  Hematology  (+) Blood dyscrasia (Eliquis), ,   Anesthesia Other Findings   Reproductive/Obstetrics                           Anesthesia Physical Anesthesia Plan  ASA: 4  Anesthesia Plan: General   Post-op Pain Management: Tylenol PO (pre-op)   Induction: Intravenous  PONV Risk Score and Plan: 3 and Dexamethasone and Ondansetron  Airway Management Planned: Oral ETT  Additional Equipment:   Intra-op Plan:   Post-operative Plan: Extubation in OR  Informed Consent: I have reviewed the patients History and Physical, chart, labs and discussed the procedure including the risks, benefits and alternatives for the proposed anesthesia with the patient or authorized representative who has indicated his/her understanding and acceptance.     Dental advisory given  Plan Discussed with: CRNA  Anesthesia Plan Comments: (CLEAR SIGHT ETOMIDATE)       Anesthesia Quick Evaluation

## 2021-04-04 NOTE — Transfer of Care (Signed)
Immediate Anesthesia Transfer of Care Note  Patient: Terry Hanson  Procedure(s) Performed: TRANSESOPHAGEAL ECHOCARDIOGRAM (TEE)  Patient Location: PACU  Anesthesia Type:MAC  Level of Consciousness: drowsy and patient cooperative  Airway & Oxygen Therapy: Patient Spontanous Breathing and Patient connected to face mask oxygen  Post-op Assessment: Report given to RN and Post -op Vital signs reviewed and stable  Post vital signs: Reviewed and stable  Last Vitals:  Vitals Value Taken Time  BP 104/74 04/04/21 1018  Temp    Pulse 92 04/04/21 1019  Resp 20 04/04/21 1019  SpO2 100 % 04/04/21 1019  Vitals shown include unvalidated device data.  Last Pain:  Vitals:   04/04/21 0758  TempSrc: Temporal  PainSc: 0-No pain         Complications: No notable events documented.

## 2021-04-04 NOTE — CV Procedure (Signed)
° °  Transesophageal Echocardiogram  Indications: Pre-atrial fibrillation ablation  Time out performed  During this procedure the patient was administered propofol under anesthesiology supervision to achieve and maintain moderate sedation.  The patient's heart rate, blood pressure, and oxygen saturation are monitored continuously during the procedure.   Findings:  Left Ventricle: EF 30%  Mitral Valve: Mild mitral regurgitation  Aortic Valve: Aortic sclerosis without stenosis, trileaflet  Tricuspid Valve: Moderate tricuspid regurgitation  Left Atrium: Normal, no left atrial appendage thrombus  Right Atrium: Normal, Saint Jude device lead noted  Intraatrial septum: Normal no Doppler evidence of shunt  Bubble Contrast Study: Not performed  Impression: No evidence of left atrial appendage thrombus  Candee Furbish, MD

## 2021-04-04 NOTE — Progress Notes (Signed)
°  Echocardiogram Echocardiogram Transesophageal has been performed.  Terry Hanson 04/04/2021, 10:42 AM

## 2021-04-04 NOTE — Anesthesia Preprocedure Evaluation (Signed)
Anesthesia Evaluation  Patient identified by MRN, date of birth, ID band Patient awake    Reviewed: Allergy & Precautions, NPO status , Patient's Chart, lab work & pertinent test results  Airway Mallampati: II  TM Distance: >3 FB Neck ROM: Full    Dental   Pulmonary asthma ,    breath sounds clear to auscultation       Cardiovascular hypertension, Pt. on home beta blockers and Pt. on medications + CAD, + Past MI, + Cardiac Stents and +CHF  + Cardiac Defibrillator  Rhythm:Irregular Rate:Normal     Neuro/Psych negative neurological ROS     GI/Hepatic negative GI ROS, Neg liver ROS,   Endo/Other  diabetes  Renal/GU Renal disease     Musculoskeletal   Abdominal   Peds  Hematology negative hematology ROS (+)   Anesthesia Other Findings   Reproductive/Obstetrics                             Anesthesia Physical Anesthesia Plan  ASA: 3  Anesthesia Plan: MAC   Post-op Pain Management:    Induction:   PONV Risk Score and Plan: 2 and Propofol infusion and Treatment may vary due to age or medical condition  Airway Management Planned: Natural Airway and Nasal Cannula  Additional Equipment:   Intra-op Plan:   Post-operative Plan:   Informed Consent: I have reviewed the patients History and Physical, chart, labs and discussed the procedure including the risks, benefits and alternatives for the proposed anesthesia with the patient or authorized representative who has indicated his/her understanding and acceptance.       Plan Discussed with:   Anesthesia Plan Comments:         Anesthesia Quick Evaluation

## 2021-04-04 NOTE — Anesthesia Procedure Notes (Signed)
Procedure Name: MAC Date/Time: 04/04/2021 9:48 AM Performed by: Janene Harvey, CRNA Pre-anesthesia Checklist: Patient identified, Emergency Drugs available, Suction available and Patient being monitored Patient Re-evaluated:Patient Re-evaluated prior to induction Oxygen Delivery Method: Simple face mask Induction Type: IV induction Placement Confirmation: positive ETCO2 Dental Injury: Teeth and Oropharynx as per pre-operative assessment

## 2021-04-04 NOTE — Pre-Procedure Instructions (Signed)
Attempted to call patient regarding procedure instructions for tomorrow.  Voicemail box full. ?

## 2021-04-04 NOTE — Interval H&P Note (Signed)
History and Physical Interval Note:  04/04/2021 9:12 AM  Terry Hanson  has presented today for surgery, with the diagnosis of PRE ABLATION  A-FIB.  The various methods of treatment have been discussed with the patient and family. After consideration of risks, benefits and other options for treatment, the patient has consented to  Procedure(s): TRANSESOPHAGEAL ECHOCARDIOGRAM (TEE) (N/A) as a surgical intervention.  The patient's history has been reviewed, patient examined, no change in status, stable for surgery.  I have reviewed the patient's chart and labs.  Questions were answered to the patient's satisfaction.     [employer]

## 2021-04-05 ENCOUNTER — Encounter (HOSPITAL_COMMUNITY): Payer: Self-pay | Admitting: Cardiology

## 2021-04-05 ENCOUNTER — Ambulatory Visit (HOSPITAL_COMMUNITY): Payer: Medicare Other | Admitting: Certified Registered Nurse Anesthetist

## 2021-04-05 ENCOUNTER — Other Ambulatory Visit: Payer: Self-pay

## 2021-04-05 ENCOUNTER — Ambulatory Visit (HOSPITAL_COMMUNITY)
Admission: RE | Admit: 2021-04-05 | Discharge: 2021-04-05 | Disposition: A | Discharge status: Home / Self Care | Payer: Medicare Other | Attending: Cardiology | Admitting: Cardiology

## 2021-04-05 ENCOUNTER — Encounter (HOSPITAL_COMMUNITY)
Admission: RE | Disposition: A | Discharge status: Home / Self Care | Payer: Medicare Other | Source: Home / Self Care | Attending: Cardiology

## 2021-04-05 DIAGNOSIS — Z7984 Long term (current) use of oral hypoglycemic drugs: Secondary | ICD-10-CM | POA: Diagnosis not present

## 2021-04-05 DIAGNOSIS — N183 Chronic kidney disease, stage 3 unspecified: Secondary | ICD-10-CM | POA: Insufficient documentation

## 2021-04-05 DIAGNOSIS — I252 Old myocardial infarction: Secondary | ICD-10-CM | POA: Diagnosis not present

## 2021-04-05 DIAGNOSIS — I4819 Other persistent atrial fibrillation: Secondary | ICD-10-CM | POA: Diagnosis not present

## 2021-04-05 DIAGNOSIS — I255 Ischemic cardiomyopathy: Secondary | ICD-10-CM | POA: Insufficient documentation

## 2021-04-05 DIAGNOSIS — E119 Type 2 diabetes mellitus without complications: Secondary | ICD-10-CM | POA: Diagnosis not present

## 2021-04-05 DIAGNOSIS — M1990 Unspecified osteoarthritis, unspecified site: Secondary | ICD-10-CM | POA: Diagnosis not present

## 2021-04-05 DIAGNOSIS — Z95810 Presence of automatic (implantable) cardiac defibrillator: Secondary | ICD-10-CM | POA: Diagnosis not present

## 2021-04-05 DIAGNOSIS — I2510 Atherosclerotic heart disease of native coronary artery without angina pectoris: Secondary | ICD-10-CM | POA: Diagnosis not present

## 2021-04-05 DIAGNOSIS — I4891 Unspecified atrial fibrillation: Secondary | ICD-10-CM | POA: Diagnosis present

## 2021-04-05 DIAGNOSIS — Z955 Presence of coronary angioplasty implant and graft: Secondary | ICD-10-CM | POA: Insufficient documentation

## 2021-04-05 DIAGNOSIS — I5022 Chronic systolic (congestive) heart failure: Secondary | ICD-10-CM | POA: Diagnosis not present

## 2021-04-05 DIAGNOSIS — E1122 Type 2 diabetes mellitus with diabetic chronic kidney disease: Secondary | ICD-10-CM | POA: Diagnosis not present

## 2021-04-05 HISTORY — PX: ATRIAL FIBRILLATION ABLATION: EP1191

## 2021-04-05 LAB — POCT ACTIVATED CLOTTING TIME
Activated Clotting Time: 341 seconds
Activated Clotting Time: 317 seconds
Activated Clotting Time: 347 seconds

## 2021-04-05 LAB — GLUCOSE, CAPILLARY
Glucose-Capillary: 99 mg/dL (ref 70–99)
Glucose-Capillary: 149 mg/dL — ABNORMAL HIGH (ref 70–99)

## 2021-04-05 SURGERY — ATRIAL FIBRILLATION ABLATION
Anesthesia: General

## 2021-04-05 MED ORDER — VANCOMYCIN HCL IN DEXTROSE 1-5 GM/200ML-% IV SOLN
INTRAVENOUS | Status: AC
  Filled 2021-04-05: qty 200

## 2021-04-05 MED ORDER — HEPARIN (PORCINE) IN NACL 1000-0.9 UT/500ML-% IV SOLN
INTRAVENOUS | Status: DC | PRN
  Administered 2021-04-05 (×5): 500 mL

## 2021-04-05 MED ORDER — PROTAMINE SULFATE 10 MG/ML IV SOLN
INTRAVENOUS | Status: DC | PRN
Start: 2021-04-05 — End: 2021-04-05
  Administered 2021-04-05: 40 mg via INTRAVENOUS

## 2021-04-05 MED ORDER — PROPOFOL 10 MG/ML IV BOLUS
INTRAVENOUS | Status: DC | PRN
  Administered 2021-04-05: 20 mg via INTRAVENOUS

## 2021-04-05 MED ORDER — SODIUM CHLORIDE 0.9 % IV SOLN: INTRAVENOUS | Status: DC

## 2021-04-05 MED ORDER — SODIUM CHLORIDE 0.9% FLUSH: 3.0000 mL | Freq: Two times a day (BID) | INTRAVENOUS | Status: DC

## 2021-04-05 MED ORDER — ONDANSETRON HCL 4 MG/2ML IJ SOLN: 4.0000 mg | Freq: Four times a day (QID) | INTRAMUSCULAR | Status: DC | PRN

## 2021-04-05 MED ORDER — HEPARIN (PORCINE) IN NACL 1000-0.9 UT/500ML-% IV SOLN
INTRAVENOUS | Status: AC
  Filled 2021-04-05: qty 2500

## 2021-04-05 MED ORDER — HEPARIN SODIUM (PORCINE) 1000 UNIT/ML IJ SOLN
INTRAMUSCULAR | Status: DC | PRN
Start: 2021-04-05 — End: 2021-04-05
  Administered 2021-04-05 (×2): 1000 [IU] via INTRAVENOUS
  Administered 2021-04-05: 14000 [IU] via INTRAVENOUS

## 2021-04-05 MED ORDER — HEPARIN SODIUM (PORCINE) 1000 UNIT/ML IJ SOLN
INTRAMUSCULAR | Status: DC | PRN
  Administered 2021-04-05: 1000 [IU] via INTRAVENOUS

## 2021-04-05 MED ORDER — LIDOCAINE 2% (20 MG/ML) 5 ML SYRINGE
INTRAMUSCULAR | Status: DC | PRN
Start: 2021-04-05 — End: 2021-04-05
  Administered 2021-04-05: 80 mg via INTRAVENOUS

## 2021-04-05 MED ORDER — DOBUTAMINE INFUSION FOR EP/ECHO/NUC (1000 MCG/ML)
INTRAVENOUS | Status: DC | PRN
  Administered 2021-04-05: 20 ug/kg/min via INTRAVENOUS

## 2021-04-05 MED ORDER — DOBUTAMINE INFUSION FOR EP/ECHO/NUC (1000 MCG/ML)
INTRAVENOUS | Status: AC
  Filled 2021-04-05: qty 250

## 2021-04-05 MED ORDER — SODIUM CHLORIDE 0.9 % IV SOLN: 250.0000 mL | INTRAVENOUS | Status: DC | PRN

## 2021-04-05 MED ORDER — ACETAMINOPHEN 500 MG PO TABS
1000.0000 mg | ORAL_TABLET | Freq: Once | ORAL | Status: AC
  Administered 2021-04-05: 1000 mg via ORAL
  Filled 2021-04-05: qty 2

## 2021-04-05 MED ORDER — SODIUM CHLORIDE 0.9% FLUSH: 3.0000 mL | INTRAVENOUS | Status: DC | PRN

## 2021-04-05 MED ORDER — NYSTATIN 100000 UNIT/GM EX POWD: 1.0000 "application " | Freq: Two times a day (BID) | CUTANEOUS | 0 refills | Status: DC

## 2021-04-05 MED ORDER — FENTANYL CITRATE (PF) 250 MCG/5ML IJ SOLN
INTRAMUSCULAR | Status: DC | PRN
  Administered 2021-04-05: 50 ug via INTRAVENOUS

## 2021-04-05 MED ORDER — SUGAMMADEX SODIUM 200 MG/2ML IV SOLN
INTRAVENOUS | Status: DC | PRN
Start: 2021-04-05 — End: 2021-04-05
  Administered 2021-04-05: 50 mg via INTRAVENOUS
  Administered 2021-04-05: 150 mg via INTRAVENOUS

## 2021-04-05 MED ORDER — ACETAMINOPHEN 500 MG PO TABS
ORAL_TABLET | ORAL | Status: AC
  Filled 2021-04-05: qty 2

## 2021-04-05 MED ORDER — DEXAMETHASONE SODIUM PHOSPHATE 10 MG/ML IJ SOLN
INTRAMUSCULAR | Status: DC | PRN
  Administered 2021-04-05: 4 mg via INTRAVENOUS

## 2021-04-05 MED ORDER — VANCOMYCIN HCL 1000 MG IV SOLR
INTRAVENOUS | Status: DC | PRN
  Administered 2021-04-05: 1000 mg via INTRAVENOUS

## 2021-04-05 MED ORDER — ROCURONIUM BROMIDE 10 MG/ML (PF) SYRINGE
PREFILLED_SYRINGE | INTRAVENOUS | Status: DC | PRN
Start: 2021-04-05 — End: 2021-04-05
  Administered 2021-04-05: 60 mg via INTRAVENOUS

## 2021-04-05 MED ORDER — EPHEDRINE SULFATE 50 MG/ML IJ SOLN
INTRAMUSCULAR | Status: DC | PRN
  Administered 2021-04-05: 5 mg via INTRAVENOUS

## 2021-04-05 MED ORDER — ACETAMINOPHEN 325 MG PO TABS: 650.0000 mg | ORAL_TABLET | ORAL | Status: DC | PRN

## 2021-04-05 MED ORDER — ETOMIDATE 2 MG/ML IV SOLN
INTRAVENOUS | Status: DC | PRN
  Administered 2021-04-05: 12 mg via INTRAVENOUS

## 2021-04-05 MED ORDER — PHENYLEPHRINE HCL-NACL 20-0.9 MG/250ML-% IV SOLN
INTRAVENOUS | Status: DC | PRN
  Administered 2021-04-05: 20 ug/min via INTRAVENOUS

## 2021-04-05 MED ORDER — PHENYLEPHRINE 40 MCG/ML (10ML) SYRINGE FOR IV PUSH (FOR BLOOD PRESSURE SUPPORT)
PREFILLED_SYRINGE | INTRAVENOUS | Status: DC | PRN
  Administered 2021-04-05 (×3): 80 ug via INTRAVENOUS

## 2021-04-05 MED ORDER — ONDANSETRON HCL 4 MG/2ML IJ SOLN
INTRAMUSCULAR | Status: DC | PRN
Start: 2021-04-05 — End: 2021-04-05
  Administered 2021-04-05: 4 mg via INTRAVENOUS

## 2021-04-05 SURGICAL SUPPLY — 20 items
BAG SNAP BAND KOVER 36X36 (MISCELLANEOUS) ×1 IMPLANT
CATH 8FR REPROCESSED SOUNDSTAR (CATHETERS) ×2 IMPLANT
CATH 8FR SOUNDSTAR REPROCESSED (CATHETERS) IMPLANT
CATH OCTARAY 2.0 F 3-3-3-3-3 (CATHETERS) ×1 IMPLANT
CATH S CIRCA THERM PROBE 10F (CATHETERS) ×1 IMPLANT
CATH SMTCH THERMOCOOL SF DF (CATHETERS) ×1 IMPLANT
CATH WEB BI DIR CSDF CRV REPRO (CATHETERS) ×1 IMPLANT
CLOSURE PERCLOSE PROSTYLE (VASCULAR PRODUCTS) ×4 IMPLANT
COVER SWIFTLINK CONNECTOR (BAG) ×3 IMPLANT
KIT VERSACROSS STEERABLE D1 (CATHETERS) ×1 IMPLANT
MAT PREVALON FULL STRYKER (MISCELLANEOUS) ×1 IMPLANT
PACK EP LATEX FREE (CUSTOM PROCEDURE TRAY) ×2
PACK EP LF (CUSTOM PROCEDURE TRAY) ×2 IMPLANT
PAD DEFIB RADIO PHYSIO CONN (PAD) ×3 IMPLANT
PATCH CARTO3 (PAD) ×1 IMPLANT
SHEATH CARTO VIZIGO SM CVD (SHEATH) ×1 IMPLANT
SHEATH PINNACLE 7F 10CM (SHEATH) ×1 IMPLANT
SHEATH PINNACLE 8F 10CM (SHEATH) ×2 IMPLANT
SHEATH PINNACLE 9F 10CM (SHEATH) ×1 IMPLANT
SHEATH PROBE COVER 6X72 (BAG) ×1 IMPLANT

## 2021-04-05 NOTE — Transfer of Care (Signed)
Immediate Anesthesia Transfer of Care Note  Patient: Terry Hanson  Procedure(s) Performed: ATRIAL FIBRILLATION ABLATION  Patient Location: PACU  Anesthesia Type:General  Level of Consciousness: drowsy  Airway & Oxygen Therapy: Patient connected to face mask oxygen  Post-op Assessment: Report given to RN and Post -op Vital signs reviewed and stable  Post vital signs: Reviewed and stable  Last Vitals:  Vitals Value Taken Time  BP 103/35 04/05/21 1102  Temp    Pulse 62 04/05/21 1103  Resp 17 04/05/21 1103  SpO2 96 % 04/05/21 1103  Vitals shown include unvalidated device data.  Last Pain:  Vitals:   04/05/21 0832  TempSrc:   PainSc: 0-No pain         Complications: There were no known notable events for this encounter.

## 2021-04-05 NOTE — H&P (Signed)
Electrophysiology Office Note   Date:  04/05/2021   ID:  Terry Hanson, DOB 12/14/1940, MRN [id number]  PCP:  Pcp, No  Cardiologist:  Aundra Dubin Primary Electrophysiologist:  Natallie Ravenscroft Meredith Leeds, MD    Chief Complaint: AF   History of Present Illness: Terry Hanson is a 81 y.o. female who is being seen today for the evaluation of AF at the request of No ref. provider found. Presenting today for electrophysiology evaluation.  She has a history significant for coronary artery disease, chronic systolic heart failure, CKD stage III, atrial fibrillation.  She had an anterior MI April 2021 with drug-eluting stent to the LAD.  She developed an ischemic cardiomyopathy with an ejection fraction of 25 to 30%.  She is status post [place] ICD.  She also has persistent atrial fibrillation.  She has been unable to take amiodarone in the past due to nausea and vomiting.  Today, denies symptoms of palpitations, chest pain, orthopnea, PND, lower extremity edema, claudication, dizziness, presyncope, syncope, bleeding, or neurologic sequela. The patient is tolerating medications without difficulties. She has shortness of breath and fatigue. Terry Hanson plan for AF ablation today.    Past Medical History:  Diagnosis Date   Arthritis    Asthma    CHF (congestive heart failure) (HCC)    Diabetes mellitus ([place])    Diabetes mellitus without complication ([place])    Enlarged heart    Hypercholesterolemia    Hyperlipidemia    Ischemic cardiomyopathy    a. EF 30-35% by echo in 06/2019   STEMI (ST elevation myocardial infarction) ([place])    a. 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   Past Surgical History:  Procedure Laterality Date   ABDOMINAL HYSTERECTOMY     CATARACT EXTRACTION W/PHACO Right 06/27/2020   Procedure: CATARACT EXTRACTION PHACO AND INTRAOCULAR LENS PLACEMENT RIGHT EYE AND PLACEMENT OF CORTICOSTEROID;  Surgeon: Baruch Goldmann, MD;  Location: [facility];  Service: Ophthalmology;  Laterality: Right;   right CDE=25.47   CORONARY/GRAFT ACUTE MI REVASCULARIZATION N/A 06/29/2019   Procedure: Coronary/Graft Acute MI Revascularization;  Surgeon: Belva Crome, MD;  Location: [place];  Service: Cardiovascular;  Laterality: N/A;   ICD IMPLANT N/A 12/14/2019   Procedure: ICD IMPLANT;  Surgeon: Evans Lance, MD;  Location: [place] [facility];  Service: Cardiovascular;  Laterality: N/A;   LEFT HEART CATH AND CORONARY ANGIOGRAPHY N/A 06/29/2019   Procedure: LEFT HEART CATH AND CORONARY ANGIOGRAPHY;  Surgeon: Belva Crome, MD;  Location: [place] [facility];  Service: Cardiovascular;  Laterality: N/A;   RIGHT HEART CATH N/A 12/30/2020   Procedure: RIGHT HEART CATH;  Surgeon: Larey Dresser, MD;  Location: [place] [facility];  Service: Cardiovascular;  Laterality: N/A;     Current Facility-Administered Medications  Medication Dose Route Frequency Provider Last Rate Last Admin   0.9 %  sodium chloride infusion   Intravenous Continuous Constance Haw, MD 50 mL/hr at 04/05/21 0652 New Bag at 04/05/21 [id number]   acetaminophen (TYLENOL) tablet 1,000 mg  1,000 mg Oral Once Santa Lighter, MD        Allergies:   Codeine, Shrimp [shellfish allergy], Amiodarone, Penicillins, and Sulfa antibiotics   Social History:  The patient  reports that she has never smoked. She has quit using smokeless tobacco.  Her smokeless tobacco use included chew. She reports that she does not currently use alcohol. She reports that she does not use drugs.   Family History:  The patient's  family history includes Heart disease in her mother.   ROS:  Please see the history of present illness.   Otherwise, review of systems is positive for none.   All other systems are reviewed and negative.   PHYSICAL EXAM: VS:  BP (!) 128/52    Pulse 82    Temp 98 F (36.7 C) (Oral)    Resp 16    Ht 5\' 2"  (1.575 m)    Wt 85.6 kg    SpO2 96%    BMI 34.53 kg/m  , BMI Body mass index is 34.53 kg/m. GEN: Well nourished, well  developed, in no acute distress  HEENT: normal  Neck: no JVD, carotid bruits, or masses Cardiac: RRR; no murmurs, rubs, or gallops,no edema  Respiratory:  clear to auscultation bilaterally, normal work of breathing GI: soft, nontender, nondistended, + BS MS: no deformity or atrophy  Skin: warm and dry Neuro:  Strength and sensation are intact Psych: euthymic mood, full affect   Recent Labs: 01/05/2021: Magnesium 2.4; TSH 2.086 01/19/2021: B Natriuretic Peptide 565.8 03/24/2021: ALT 6; BUN 15; Creatinine, Ser 1.71; Hemoglobin 12.2; NT-Pro BNP 3,560; Platelets 284; Potassium 4.4; Sodium 137    Lipid Panel     Component Value Date/Time   CHOL 201 (H) 03/24/2021 0754   TRIG 187 (H) 03/24/2021 0754   HDL 36 (L) 03/24/2021 0754   CHOLHDL 5.6 (H) 03/24/2021 0754   CHOLHDL 5.5 02/28/2021 1110   VLDL 24 02/28/2021 1110   LDLCALC 131 (H) 03/24/2021 0754     Wt Readings from Last 3 Encounters:  04/05/21 85.6 kg  04/04/21 85.3 kg  03/31/21 85.6 kg      Other studies Reviewed: Additional studies/ records that were reviewed today include: TTE 03/20/20  Review of the above records today demonstrates:   1. Akinesis of the anterior, septal and apical walls with overall severe  LV dysfunction; no apical thrombus noted using definity.   2. Left ventricular ejection fraction, by estimation, is 25 to 30%. The  left ventricle has severely decreased function. The left ventricle  demonstrates regional wall motion abnormalities (see scoring  diagram/findings for description). Left ventricular  diastolic function could not be evaluated.   3. Right ventricular systolic function is normal. The right ventricular  size is normal. There is normal pulmonary artery systolic pressure.   4. The mitral valve is normal in structure. Mild mitral valve  regurgitation. No evidence of mitral stenosis.   5. The aortic valve has an indeterminant number of cusps. Aortic valve  regurgitation is not visualized. No  aortic stenosis is present.   6. The inferior vena cava is normal in size with greater than 50%  respiratory variability, suggesting right atrial pressure of 3 mmHg.    ASSESSMENT AND PLAN:  1.  Chronic systolic heart failure due to ischemic cardiomyopathy: Status post Ernest ICD.  Ejection fraction 25 to 30%.  Currently off medical therapy per heart failure cardiology.  2.  Persistent atrial fibrillation: Terry Hanson has presented today for surgery, with the diagnosis of atrial fibrillation.  The various methods of treatment have been discussed with the patient and family. After consideration of risks, benefits and other options for treatment, the patient has consented to  Procedure(s): Catheter ablation as a surgical intervention .  Risks include but not limited to complete heart block, stroke, esophageal damage, nerve damage, bleeding, vascular damage, tamponade, perforation, MI, and death. The patient's history has been reviewed, patient examined, no change in status, stable  for surgery.  I have reviewed the patient's chart and labs.  Questions were answered to the patient's satisfaction.    Terry Bills Curt Bears, MD 04/05/2021 7:17 AM

## 2021-04-05 NOTE — Discharge Instructions (Signed)
Post procedure care instructions No driving for 4 days. No lifting over 5 lbs for 1 week. No vigorous or sexual activity for 1 week. You may return to work/your usual activities on 04/13/21. Keep procedure site clean & dry. If you notice increased pain, swelling, bleeding or pus, call/return!  You may shower after 24 hours, but no soaking in baths/hot tubs/pools for 1 week.    You have an appointment set up with the [facility].  Multiple studies have shown that being followed by a dedicated atrial fibrillation clinic in addition to the standard care you receive from your other physicians improves health. We believe that enrollment in the atrial fibrillation clinic will allow [place] to better care for you.   The phone number to the [facility] is [phone]. The clinic is staffed Monday through Friday from 8:30am to 5pm.  Parking Directions: The clinic is located in the Heart and Vascular Building connected to [facility]. 1)From [address] turn on to [address] and go to the 3rd entrance  (Heart and Vascular entrance) on the right. 2)Look to the right for Heart &Vascular Parking Garage. 3)A code for the entrance is required, for Feb is 1204.   4)Take the elevators to the 1st floor. Registration is in the room with the glass walls at the end of the hallway.  If you have any trouble parking or locating the clinic, please dont hesitate to call [phone].

## 2021-04-05 NOTE — Anesthesia Procedure Notes (Addendum)
Procedure Name: Intubation Date/Time: 04/05/2021 8:42 AM Performed by: Harden Mo, CRNA Pre-anesthesia Checklist: Patient identified, Emergency Drugs available, Suction available and Patient being monitored Patient Re-evaluated:Patient Re-evaluated prior to induction Oxygen Delivery Method: Circle system utilized Preoxygenation: Pre-oxygenation with 100% oxygen Induction Type: IV induction Ventilation: Mask ventilation without difficulty Laryngoscope Size: Mac and 3 Grade View: Grade I Tube type: Oral Tube size: 7.0 mm Number of attempts: 1 Airway Equipment and Method: Stylet and Oral airway Placement Confirmation: ETT inserted through vocal cords under direct vision, positive ETCO2 and breath sounds checked- equal and bilateral Secured at: 21 cm Tube secured with: Tape Dental Injury: Teeth and Oropharynx as per pre-operative assessment  Comments: Intubation by Sherre Scarlet

## 2021-04-05 NOTE — Progress Notes (Signed)
Dr Elberta Fortis notified of pt BP at 88/60, manually obtained, ok to move pt to Short Stay, safety maintained

## 2021-04-05 NOTE — Progress Notes (Signed)
Dr Elberta Fortis paged regarding pt decreased BP, Dr. Elberta Fortis to bedside at 1115 along with anesthesia, meds given by anesthesia, safety maintained

## 2021-04-05 NOTE — Anesthesia Postprocedure Evaluation (Signed)
Anesthesia Post Note  Patient: Terry Hanson  Procedure(s) Performed: ATRIAL FIBRILLATION ABLATION     Patient location during evaluation: PACU Anesthesia Type: General Level of consciousness: awake and alert Pain management: pain level controlled Vital Signs Assessment: post-procedure vital signs reviewed and stable Respiratory status: spontaneous breathing, nonlabored ventilation, respiratory function stable and patient connected to nasal cannula oxygen Cardiovascular status: blood pressure returned to baseline and stable Postop Assessment: no apparent nausea or vomiting Anesthetic complications: no   There were no known notable events for this encounter.  Last Vitals:  Vitals:   04/05/21 1415 04/05/21 1425  BP:  105/70  Pulse: 67 66  Resp: 13 15  Temp:    SpO2: 100% 100%    Last Pain:  Vitals:   04/05/21 1300  TempSrc:   PainSc: 0-No pain                 Collene Schlichter

## 2021-04-05 NOTE — Progress Notes (Signed)
Pt has red raised rash to groin that looks like yeast.  Dr Elberta Fortis notified

## 2021-04-06 ENCOUNTER — Encounter (HOSPITAL_COMMUNITY): Payer: Self-pay | Admitting: Cardiology

## 2021-04-11 IMAGING — DX DG CHEST 1V PORT
1 series · 1 of 1 positions shown · non-contrast
Comparison: Radiograph 06/29/2019

CLINICAL DATA: Shortness of breath. Recent myocardial infarction.

EXAM:
PORTABLE CHEST 1 VIEW

[chest ap grid]
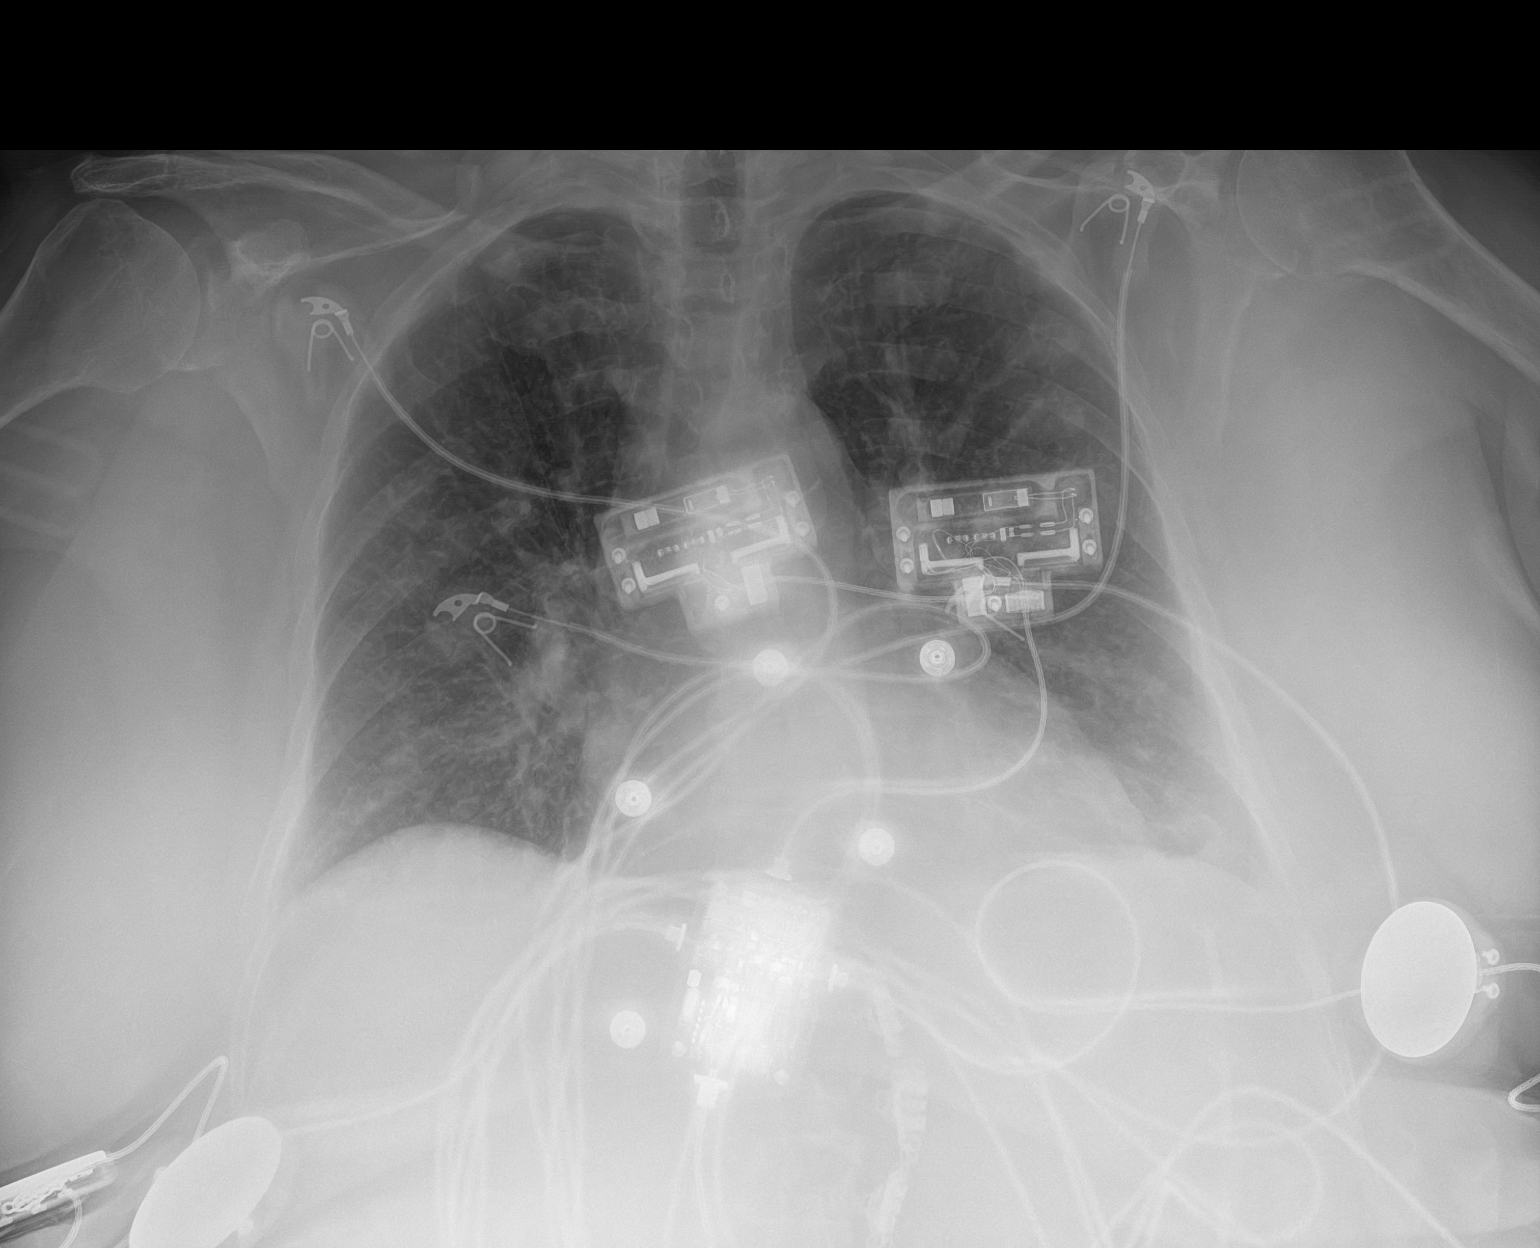

[1 of 1 positions shown; findings below may reference images not displayed]

FINDINGS: Overlying artifact from [place] [employer] [place]/monitoring device. Low lung
volumes. Mild cardiomegaly. Prior perihilar opacities have near
completely resolved likely pulmonary edema. No pleural fluid,
pneumothorax, or confluent airspace disease.
IMPRESSION: Mild cardiomegaly. Resolved pulmonary edema from exam earlier this
month.

## 2021-04-17 NOTE — Progress Notes (Signed)
Remote ICD transmission.   

## 2021-04-27 ENCOUNTER — Other Ambulatory Visit: Payer: Self-pay

## 2021-04-27 ENCOUNTER — Encounter (HOSPITAL_COMMUNITY): Payer: Self-pay | Admitting: Cardiology

## 2021-04-27 ENCOUNTER — Ambulatory Visit (HOSPITAL_COMMUNITY)
Admission: RE | Admit: 2021-04-27 | Discharge: 2021-04-27 | Disposition: A | Discharge status: Home / Self Care | Payer: Medicare Other | Source: Ambulatory Visit | Attending: Cardiology | Admitting: Cardiology

## 2021-04-27 VITALS — BP 132/60 | HR 62 | Wt 187.6 lb

## 2021-04-27 DIAGNOSIS — N183 Chronic kidney disease, stage 3 unspecified: Secondary | ICD-10-CM | POA: Diagnosis not present

## 2021-04-27 DIAGNOSIS — Z7984 Long term (current) use of oral hypoglycemic drugs: Secondary | ICD-10-CM | POA: Insufficient documentation

## 2021-04-27 DIAGNOSIS — E1122 Type 2 diabetes mellitus with diabetic chronic kidney disease: Secondary | ICD-10-CM | POA: Diagnosis not present

## 2021-04-27 DIAGNOSIS — I5022 Chronic systolic (congestive) heart failure: Secondary | ICD-10-CM | POA: Insufficient documentation

## 2021-04-27 DIAGNOSIS — I4819 Other persistent atrial fibrillation: Secondary | ICD-10-CM | POA: Insufficient documentation

## 2021-04-27 DIAGNOSIS — Z87891 Personal history of nicotine dependence: Secondary | ICD-10-CM | POA: Diagnosis not present

## 2021-04-27 DIAGNOSIS — I255 Ischemic cardiomyopathy: Secondary | ICD-10-CM | POA: Diagnosis not present

## 2021-04-27 DIAGNOSIS — E7800 Pure hypercholesterolemia, unspecified: Secondary | ICD-10-CM | POA: Diagnosis not present

## 2021-04-27 DIAGNOSIS — I252 Old myocardial infarction: Secondary | ICD-10-CM | POA: Diagnosis not present

## 2021-04-27 DIAGNOSIS — I2510 Atherosclerotic heart disease of native coronary artery without angina pectoris: Secondary | ICD-10-CM | POA: Diagnosis not present

## 2021-04-27 DIAGNOSIS — Z7901 Long term (current) use of anticoagulants: Secondary | ICD-10-CM | POA: Diagnosis not present

## 2021-04-27 DIAGNOSIS — Z7902 Long term (current) use of antithrombotics/antiplatelets: Secondary | ICD-10-CM | POA: Insufficient documentation

## 2021-04-27 DIAGNOSIS — Z79899 Other long term (current) drug therapy: Secondary | ICD-10-CM | POA: Insufficient documentation

## 2021-04-27 LAB — BASIC METABOLIC PANEL
Anion gap: 11 (ref 5–15)
BUN: 24 mg/dL — ABNORMAL HIGH (ref 8–23)
CO2: 23 mmol/L (ref 22–32)
Calcium: 9.8 mg/dL (ref 8.9–10.3)
Chloride: 106 mmol/L (ref 98–111)
Creatinine, Ser: 1.83 mg/dL — ABNORMAL HIGH (ref 0.44–1.00)
GFR, Estimated: 28 mL/min — ABNORMAL LOW (ref >60)
Glucose, Bld: 127 mg/dL — ABNORMAL HIGH (ref 70–99)
Potassium: 4.4 mmol/L (ref 3.5–5.1)
Sodium: 140 mmol/L (ref 135–145)

## 2021-04-27 LAB — CBC
HCT: 36.6 % (ref 36.0–46.0)
Hemoglobin: 11.8 g/dL — ABNORMAL LOW (ref 12.0–15.0)
MCH: 30.9 pg (ref 26.0–34.0)
MCHC: 32.2 g/dL (ref 30.0–36.0)
MCV: 95.8 fL (ref 80.0–100.0)
Platelets: 216 10*3/uL (ref 150–400)
RBC: 3.82 MIL/uL — ABNORMAL LOW (ref 3.87–5.11)
RDW: 13.6 % (ref 11.5–15.5)
WBC: 8.9 10*3/uL (ref 4.0–10.5)
nRBC: 0 % (ref 0.0–0.2)

## 2021-04-27 LAB — DIGOXIN LEVEL: Digoxin Level: 0.4 ng/mL — ABNORMAL LOW (ref 0.8–2.0)

## 2021-04-27 MED ORDER — SPIRONOLACTONE 25 MG PO TABS: 25.0000 mg | ORAL_TABLET | Freq: Every day | ORAL | 11 refills | Status: DC

## 2021-04-27 NOTE — Progress Notes (Signed)
PCP: Pcp, No Cardiology: Dr. Tamala Julian HF Cardiology: Dr. Aundra Dubin  81 y.o. with history of CAD, chronic systolic CHF, CKD stage 3 and atrial fibrillation was referred by Dr. Tamala Julian for evaluation of CHF.  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  She developed ischemic cardiomyopathy, last echo in 1/22 showed EF 25-30%.  She now has a Research officer, political party ICD. Patient also has persistent atrial fibrillation.  She was unable to take amiodarone in the past due to nausea/vomiting.    RHC was done in 10/22, showing mildly elevated right and left heart filling pressures and CI 1.96.  Lasix was increased to 40 mg daily.    She had atrial fibrillation ablation in 1/23 and is in NSR today.  TEE at AF ablation showed EF 25-30%.   She returns for followup of CHF.  Weight is down 1 lb.  She is short of breath with "heavy exertion."  No chest pain.  Lightheaded if she stands up fast after having been sitting or lying down for a while.  No orthopnea/PND.    Labs (5/22): K 4.4, creatinine 1.47 Labs (11/22): K 4.1, creatinine 1.83 => 2.17 Labs (1/23): digoxin <0.4, LDL 131, K 4.4, creatinine 1.71  St Jude device interrogation: thoracic impedance stable, no VT.   ECG (personally reviewed): NSR, PVC, nonspecific ST changes  PMH: 1. CAD: Anterior MI in 4/21.  LHC with 99% pLAD, 70% pRCA, 80%, OM1, 60% mLCx.  Patient had PCI to pLAD.   2. Chronic systolic CHF: Ischemic cardiomyopathy.  St Jude ICD.  - Echo (1/22): EF 25-30%, RV normal.  - RHC (10/22): mean RA 10, PA 37/16 mean 26, mean PCWP 23, CI 1.96 3. CKD stage 3 4. Type 2 diabetes 5. Hyperlipidemia: Myalgias with Crestor and Lipitor.  6. Atrial fibrillation: Persistent.  - Nausea/vomiting with amiodarone.  - Atrial fibrillation ablation 1/23.  7. H/o VT 8. COVID-19 in 1/22  Social History   Socioeconomic History   Marital status: Married    Spouse name: Not on file   Number of children: Not on file   Years of education: Not on file    Highest education level: Not on file  Occupational History   Not on file  Tobacco Use   Smoking status: Never   Smokeless tobacco: Former    Types: Nurse, children's Use: Never used  Substance and Sexual Activity   Alcohol use: Not Currently   Drug use: Never   Sexual activity: Not on file  Other Topics Concern   Not on file  Social History Narrative   ** Merged History Encounter **       Social Determinants of Health   Financial Resource Strain: Not on file  Food Insecurity: Not on file  Transportation Needs: Not on file  Physical Activity: Not on file  Stress: Not on file  Social Connections: Not on file  Intimate Partner Violence: Not on file   Family History  Problem Relation Age of Onset   Heart disease Mother    ROS: All systems reviewed and negative except as per HPI.   Current Outpatient Medications  Medication Sig Dispense Refill   apixaban (ELIQUIS) 5 MG TABS tablet Take 1 tablet (5 mg total) by mouth daily. 60 tablet 11   clopidogrel (PLAVIX) 75 MG tablet Take 1 tablet (75 mg total) by mouth daily before breakfast. 90 tablet 1   dapagliflozin propanediol (FARXIGA) 10 MG TABS tablet Take 1 tablet (  10 mg total) by mouth daily before breakfast. 30 tablet 3   digoxin (LANOXIN) 0.125 MG tablet Take 0.5 tablets (0.0625 mg total) by mouth 3 (three) times a week. Taking Monday, Wednesday, and Friday. 45 tablet 3   furosemide (LASIX) 20 MG tablet Take 1 tablet (20 mg total) by mouth daily. 120 tablet 3   levothyroxine (SYNTHROID) 25 MCG tablet Take 1 tablet (25 mcg total) by mouth daily before breakfast. 90 tablet 3   metoprolol succinate (TOPROL-XL) 50 MG 24 hr tablet Take 1 tablet (50 mg total) by mouth at bedtime. 90 tablet 3   nitroGLYCERIN (NITROSTAT) 0.4 MG SL tablet Place 1 tablet (0.4 mg total) under the tongue every 5 (five) minutes as needed. 25 tablet 2   nystatin (MYCOSTATIN/NYSTOP) powder Apply 1 application topically 2 (two) times daily. 15 g 0    potassium chloride (KLOR-CON) 10 MEQ tablet Take 1 tablet (10 mEq total) by mouth 2 (two) times daily. May take 1 extra tablet by mouth for 2-3 pound weight gain overnight, shortness of breath, edema     sacubitril-valsartan (ENTRESTO) 24-26 MG Take 1 tablet by mouth 2 (two) times daily. 180 tablet 3   simvastatin (ZOCOR) 20 MG tablet Take 1 tablet (20 mg total) by mouth every evening. 90 tablet 3   spironolactone (ALDACTONE) 25 MG tablet Take 1 tablet (25 mg total) by mouth at bedtime. 30 tablet 11   No current facility-administered medications for this encounter.   BP 132/60    Pulse 62    Wt 85.1 kg (187 lb 9.6 oz)    SpO2 97%    BMI 34.31 kg/m  General: NAD Neck: No JVD, no thyromegaly or thyroid nodule.  Lungs: Clear to auscultation bilaterally with normal respiratory effort. CV: Nondisplaced PMI.  Heart regular S1/S2, no S3/S4, no murmur.  No peripheral edema.  No carotid bruit.  Normal pedal pulses.  Abdomen: Soft, nontender, no hepatosplenomegaly, no distention.  Skin: Intact without lesions or rashes.  Neurologic: Alert and oriented x 3.  Psych: Normal affect. Extremities: No clubbing or cyanosis.  HEENT: Normal.   1. CAD: Patient has history of anterior MI in 4/21 with PCI to the proximal LAD.  No chest pain.  - She is on apixaban and Plavix.  Dr. Katrinka Blazing has wanted her to continue Plavix.    - She is on simvastatin 20 mg daily and cannot take higher dose or other statins due to myalgias.  LDL is too high, will refer to lipid clinic for [place].  2. Chronic systolic CHF: Ischemic cardiomyopathy with St Jude ICD.  Last echo in 1/22 with EF 25-30%.  RHC in 10/22 showed low cardiac index at 1.96 and mildly increased filling pressures.  Currently, NYHA class II.  By exam and [place], she is not significantly volume overloaded.  Creatinine stable recently at 1.71.  - Continue Toprol XL  50 mg daily, take at bedtime.  - Continue Entresto 24/26 bid.   - Continue digoxin, check level today.   - Increase spironolactone to 25 mg daily, BMET today and in 10 days.   - Continue dapagliflozin.   - Keep Lasix at 40 mg daily.  - Narrow QRS, not CRT candidate.  3. Atrial fibrillation: Paroxysmal.  She did not tolerate amiodarone.  Poor candidate for Tikosyn with CKD. She had AF ablation in 1/23 and is in NSR today.  - Continue Eliquis.  4. CKD: Stage 3.  BMET today.   Followup 2 months with APP.  Loralie Champagne 04/27/2021

## 2021-04-27 NOTE — Patient Instructions (Signed)
Medication Changes:  Increase your Spironolactone to 25 mg daily  Lab Work:  Labs done today, your results will be available in MyChart, we will contact you for abnormal readings.   Testing/Procedures:  Repeat blood work in 10 days  Referrals:  You have been referred to lipid clinic. They will call you to schedule your appointment    Special Instructions // Education:  none  Follow-Up in: 2 months  At the [facility], you and your health needs are our priority. We have a designated team specialized in the treatment of Heart Failure. This Care Team includes your primary Heart Failure Specialized Cardiologist (physician), Advanced Practice Providers (APPs- Physician Assistants and Nurse Practitioners), and Pharmacist who all work together to provide you with the care you need, when you need it.   You may see any of the following providers on your designated Care Team at your next follow up:  Dr Arvilla Meres Dr Carron Curie, NP Robbie Lis, [facility] [place], [place] Karle Plumber, PharmD   Please be sure to bring in all your medications bottles to every appointment.   Need to Contact us:  If you have any questions or concerns before your next appointment please send [place] a message through [place] or call our office at [phone].    TO LEAVE A MESSAGE FOR THE NURSE SELECT OPTION 2, PLEASE LEAVE A MESSAGE INCLUDING: YOUR NAME DATE OF BIRTH CALL BACK NUMBER REASON FOR CALL**this is important as we prioritize the call backs  YOU WILL RECEIVE A CALL BACK THE SAME DAY AS LONG AS YOU CALL BEFORE 4:00 PM

## 2021-04-28 ENCOUNTER — Other Ambulatory Visit (HOSPITAL_COMMUNITY): Payer: Self-pay | Admitting: Cardiology

## 2021-05-03 ENCOUNTER — Encounter (HOSPITAL_COMMUNITY): Payer: Self-pay | Admitting: Nurse Practitioner

## 2021-05-03 ENCOUNTER — Other Ambulatory Visit: Payer: Self-pay

## 2021-05-03 ENCOUNTER — Ambulatory Visit (HOSPITAL_COMMUNITY)
Admit: 2021-05-03 | Discharge: 2021-05-03 | Disposition: A | Discharge status: Home / Self Care | Payer: Medicare Other | Attending: Nurse Practitioner | Admitting: Nurse Practitioner

## 2021-05-03 VITALS — BP 150/50 | HR 58 | Ht 62.0 in | Wt 184.8 lb

## 2021-05-03 DIAGNOSIS — I509 Heart failure, unspecified: Secondary | ICD-10-CM | POA: Insufficient documentation

## 2021-05-03 DIAGNOSIS — D6869 Other thrombophilia: Secondary | ICD-10-CM

## 2021-05-03 DIAGNOSIS — I2510 Atherosclerotic heart disease of native coronary artery without angina pectoris: Secondary | ICD-10-CM | POA: Diagnosis not present

## 2021-05-03 DIAGNOSIS — I4891 Unspecified atrial fibrillation: Secondary | ICD-10-CM | POA: Diagnosis present

## 2021-05-03 DIAGNOSIS — I4819 Other persistent atrial fibrillation: Secondary | ICD-10-CM | POA: Diagnosis not present

## 2021-05-03 DIAGNOSIS — E119 Type 2 diabetes mellitus without complications: Secondary | ICD-10-CM | POA: Diagnosis not present

## 2021-05-03 NOTE — Progress Notes (Signed)
Primary Care Physician: Pcp, No Referring Physician:[place]    Terry Hanson is a 81 y.o. female with a h/o DM. CHF, CAD, afib that is here for afib ablation one month ago . She reports that she has not had any swallowing or groin issues. No issues with anticoagulation. She is back to her usual activities.   Today, she denies symptoms of palpitations, chest pain, shortness of breath, orthopnea, PND, lower extremity edema, dizziness, presyncope, syncope, or neurologic sequela. The patient is tolerating medications without difficulties and is otherwise without complaint today.   Past Medical History:  Diagnosis Date   Arthritis    Asthma    CHF (congestive heart failure) (HCC)    Diabetes mellitus ([place])    Diabetes mellitus without complication ([place])    Enlarged heart    Hypercholesterolemia    Hyperlipidemia    Ischemic cardiomyopathy    a. EF 30-35% by echo in 06/2019   STEMI (ST elevation myocardial infarction) [facility])    a. 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   Past Surgical History:  Procedure Laterality Date   ABDOMINAL HYSTERECTOMY     ATRIAL FIBRILLATION ABLATION N/A 04/05/2021   Procedure: [place];  Surgeon: Constance Haw, MD;  Location: [place] [facility];  Service: Cardiovascular;  Laterality: N/A;   CATARACT EXTRACTION W/PHACO Right 06/27/2020   Procedure: CATARACT EXTRACTION PHACO AND INTRAOCULAR LENS PLACEMENT RIGHT EYE AND PLACEMENT OF CORTICOSTEROID;  Surgeon: Baruch Goldmann, MD;  Location: [facility];  Service: Ophthalmology;  Laterality: Right;  right CDE=25.47   CORONARY/GRAFT ACUTE MI REVASCULARIZATION N/A 06/29/2019   Procedure: Coronary/Graft Acute MI Revascularization;  Surgeon: Belva Crome, MD;  Location: [place] [facility];  Service: Cardiovascular;  Laterality: N/A;   ICD IMPLANT N/A 12/14/2019   Procedure: ICD IMPLANT;  Surgeon: Evans Lance, MD;  Location: [place] [facility];  Service: Cardiovascular;  Laterality: N/A;    LEFT HEART CATH AND CORONARY ANGIOGRAPHY N/A 06/29/2019   Procedure: LEFT HEART CATH AND CORONARY ANGIOGRAPHY;  Surgeon: Belva Crome, MD;  Location: [place] [facility];  Service: Cardiovascular;  Laterality: N/A;   RIGHT HEART CATH N/A 12/30/2020   Procedure: RIGHT HEART CATH;  Surgeon: Larey Dresser, MD;  Location: [place] [facility];  Service: Cardiovascular;  Laterality: N/A;   TEE WITHOUT CARDIOVERSION N/A 04/04/2021   Procedure: TRANSESOPHAGEAL ECHOCARDIOGRAM (TEE);  Surgeon: Jerline Pain, MD;  Location: [facility] END[facility];  Service: Cardiovascular;  Laterality: N/A;    Current Outpatient Medications  Medication Sig Dispense Refill   apixaban (ELIQUIS) 5 MG TABS tablet Take 1 tablet (5 mg total) by mouth daily. 60 tablet 11   clopidogrel (PLAVIX) 75 MG tablet Take 1 tablet (75 mg total) by mouth daily before breakfast. 90 tablet 1   dapagliflozin propanediol (FARXIGA) 10 MG TABS tablet Take 1 tablet (10 mg total) by mouth daily before breakfast. 30 tablet 3   digoxin (LANOXIN) 0.125 MG tablet Take 0.5 tablets (0.0625 mg total) by mouth 3 (three) times a week. Taking Monday, Wednesday, and Friday. 45 tablet 3   furosemide (LASIX) 20 MG tablet Take 1 tablet (20 mg total) by mouth daily. 120 tablet 3   levothyroxine (SYNTHROID) 25 MCG tablet Take 1 tablet (25 mcg total) by mouth daily before breakfast. 90 tablet 3   metoprolol succinate (TOPROL-XL) 50 MG 24 hr tablet Take 1 tablet (50 mg total) by mouth at bedtime. 90 tablet 3   nitroGLYCERIN (NITROSTAT) 0.4 MG  SL tablet Place 1 tablet (0.4 mg total) under the tongue every 5 (five) minutes as needed. 25 tablet 2   nystatin (MYCOSTATIN/NYSTOP) powder Apply 1 application topically 2 (two) times daily. 15 g 0   potassium chloride (KLOR-CON) 10 MEQ tablet Take 1 tablet (10 mEq total) by mouth 2 (two) times daily. May take 1 extra tablet by mouth for 2-3 pound weight gain overnight, shortness of breath, edema     sacubitril-valsartan (ENTRESTO)  24-26 MG Take 1 tablet by mouth 2 (two) times daily. 180 tablet 3   simvastatin (ZOCOR) 20 MG tablet TAKE 1 TABLET(20 MG) BY MOUTH EVERY EVENING 90 tablet 3   spironolactone (ALDACTONE) 25 MG tablet Take 1 tablet (25 mg total) by mouth at bedtime. 30 tablet 11   No current facility-administered medications for this encounter.    Allergies  Allergen Reactions   Codeine Hives   Shrimp [Shellfish Allergy] Other (See Comments)    Broke out   Amiodarone Nausea And Vomiting   Penicillins Itching and Rash    Did it involve swelling of the face/tongue/throat, SOB, or low BP? Yes Did it involve sudden or severe rash/hives, skin peeling, or any reaction on the inside of your mouth or nose? Yes Did you need to seek medical attention at a hospital or doctor's office? Yes When did it last happen? Over 10 years       If all above answers are NO, may proceed with cephalosporin use.    Sulfa Antibiotics Itching    Social History   Socioeconomic History   Marital status: Married    Spouse name: Not on file   Number of children: Not on file   Years of education: Not on file   Highest education level: Not on file  Occupational History   Not on file  Tobacco Use   Smoking status: Never   Smokeless tobacco: Former    Types: Chew   Tobacco comments:    Former chew tobacco 05/03/21  Vaping Use   Vaping Use: Never used  Substance and Sexual Activity   Alcohol use: Not Currently   Drug use: Never   Sexual activity: Not on file  Other Topics Concern   Not on file  Social History Narrative   ** Merged History Encounter **       Social Determinants of Health   Financial Resource Strain: Not on file  Food Insecurity: Not on file  Transportation Needs: Not on file  Physical Activity: Not on file  Stress: Not on file  Social Connections: Not on file  Intimate Partner Violence: Not on file    Family History  Problem Relation Age of Onset   Heart disease Mother     ROS- All  systems are reviewed and negative except as per the HPI above  Physical Exam: Vitals:   05/03/21 1158  BP: (!) 150/50  Pulse: (!) 58  Weight: 83.8 kg  Height: 5\' 2"  (1.575 m)   Wt Readings from Last 3 Encounters:  05/03/21 83.8 kg  04/27/21 85.1 kg  04/05/21 85.6 kg    Labs: Lab Results  Component Value Date   NA 140 04/27/2021   K 4.4 04/27/2021   CL 106 04/27/2021   CO2 23 04/27/2021   GLUCOSE 127 (H) 04/27/2021   BUN 24 (H) 04/27/2021   CREATININE 1.83 (H) 04/27/2021   CALCIUM 9.8 04/27/2021   PHOS 3.8 03/21/2020   MG 2.4 01/05/2021   Lab Results  Component Value Date  INR 2.2 (H) 07/27/2019   Lab Results  Component Value Date   CHOL 201 (H) 03/24/2021   HDL 36 (L) 03/24/2021   LDLCALC 131 (H) 03/24/2021   TRIG 187 (H) 03/24/2021     GEN- The patient is well appearing, alert and oriented x 3 today.   Head- normocephalic, atraumatic Eyes-  Sclera clear, conjunctiva pink Ears- hearing intact Oropharynx- clear Neck- supple, no JVP Lymph- no cervical lymphadenopathy Lungs- Clear to ausculation bilaterally, normal work of breathing Heart- Regular rate and rhythm, no murmurs, rubs or gallops, PMI not laterally displaced GI- soft, NT, ND, + BS Extremities- no clubbing, cyanosis, or edema MS- no significant deformity or atrophy Skin- no rash or lesion Psych- euthymic mood, full affect Neuro- strength and sensation are intact  EKG-Vent. rate 58 BPM PR interval 146 ms QRS duration 88 ms QT/QTcB 408/400 ms P-R-T axes 40 118 56 Sinus bradycardia Right axis deviation Low voltage QRS Nonspecific ST and T wave abnormality Abnormal ECG When compared with ECG of 27-Apr-2021 10:47,    Assessment and Plan:  1. Afib Doing  well s/p afib ablation staying in SR Continue digoxin and metoprolol at current doses  2. CHA2DS2VASc  score of at least 5 Continue eliquis 5 mg bid  Reminded not to interrupt for elective procedures for the 3 months s/p abaltion  recovery period   F/u with Dr. Curt Bears 07/20/21  Geroge Baseman. Geniya Fulgham, [facility] [address] [place], [place] [address] [phone]

## 2021-05-16 ENCOUNTER — Telehealth (HOSPITAL_COMMUNITY): Payer: Self-pay | Admitting: *Deleted

## 2021-05-16 NOTE — Telephone Encounter (Signed)
Lab order faxed to [facility] [phone] ?

## 2021-05-22 ENCOUNTER — Ambulatory Visit: Payer: Medicare Other

## 2021-05-22 NOTE — Progress Notes (Deleted)
Patient ID: Terry Hanson                 DOB: 03-May-1940                    MRN: [id number] ? ? ? ? ?HPI: ?Terry Hanson is a 81 y.o. female patient referred to lipid clinic by Dr. Aundra Dubin. PMH is significant for CAD, chronic systolic CHF, CKD stage 3 and atrial fibrillation. 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 She has had myalgias with Crestor and Lipitor. She is currently on simvastatin 20mg  daily.  ? ?On multiple brand name medications ? ? ?Current Medications: simvastatin 20mg  daily ?Intolerances: rosuvastatin 40mg , atorvastatin ?Risk Factors: DM, CKD, CAD ?LDL-C goal: <55 ? ?Diet:  ? ?Exercise:  ? ?Family History:  ? ?Social History:  ? ?Labs:03/24/21 TC 201 TG 187 HDL 36 LDL-C 131 (simvastatin 20mg  daily) ? ?Past Medical History:  ?Diagnosis Date  ? Arthritis   ? Asthma   ? CHF (congestive heart failure) ([place])   ? Diabetes mellitus ([place])   ? Diabetes mellitus without complication ([place])   ? Enlarged heart   ? Hypercholesterolemia   ? Hyperlipidemia   ? Ischemic cardiomyopathy   ? a. EF 30-35% by echo in 06/2019  ? STEMI (ST elevation myocardial infarction) ([place])   ? a. 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  ? ? ?Current Outpatient Medications on File Prior to Visit  ?Medication Sig Dispense Refill  ? apixaban (ELIQUIS) 5 MG TABS tablet Take 1 tablet (5 mg total) by mouth daily. 60 tablet 11  ? clopidogrel (PLAVIX) 75 MG tablet Take 1 tablet (75 mg total) by mouth daily before breakfast. 90 tablet 1  ? dapagliflozin propanediol (FARXIGA) 10 MG TABS tablet Take 1 tablet (10 mg total) by mouth daily before breakfast. 30 tablet 3  ? digoxin (LANOXIN) 0.125 MG tablet Take 0.5 tablets (0.0625 mg total) by mouth 3 (three) times a week. Taking Monday, Wednesday, and Friday. 45 tablet 3  ? furosemide (LASIX) 20 MG tablet Take 1 tablet (20 mg total) by mouth daily. 120 tablet 3  ? levothyroxine (SYNTHROID) 25 MCG tablet Take 1 tablet (25 mcg total) by mouth daily before breakfast. 90 tablet 3   ? metoprolol succinate (TOPROL-XL) 50 MG 24 hr tablet Take 1 tablet (50 mg total) by mouth at bedtime. 90 tablet 3  ? nitroGLYCERIN (NITROSTAT) 0.4 MG SL tablet Place 1 tablet (0.4 mg total) under the tongue every 5 (five) minutes as needed. 25 tablet 2  ? nystatin (MYCOSTATIN/NYSTOP) powder Apply 1 application topically 2 (two) times daily. 15 g 0  ? potassium chloride (KLOR-CON) 10 MEQ tablet Take 1 tablet (10 mEq total) by mouth 2 (two) times daily. May take 1 extra tablet by mouth for 2-3 pound weight gain overnight, shortness of breath, edema    ? sacubitril-valsartan (ENTRESTO) 24-26 MG Take 1 tablet by mouth 2 (two) times daily. 180 tablet 3  ? simvastatin (ZOCOR) 20 MG tablet TAKE 1 TABLET(20 MG) BY MOUTH EVERY EVENING 90 tablet 3  ? spironolactone (ALDACTONE) 25 MG tablet Take 1 tablet (25 mg total) by mouth at bedtime. 30 tablet 11  ? ?No current facility-administered medications on file prior to visit.  ? ? ?Allergies  ?Allergen Reactions  ? Codeine Hives  ? Shrimp [Shellfish Allergy] Other (See Comments)  ?  Broke out  ? Amiodarone Nausea And Vomiting  ? Penicillins Itching and Rash  ?  Did it involve swelling of the face/tongue/throat, SOB, or low BP? Yes ?Did it involve sudden or severe rash/hives, skin peeling, or any reaction on the inside of your mouth or nose? Yes ?Did you need to seek medical attention at a hospital or doctor's office? Yes ?When did it last happen? Over 10 years       ?If all above answers are ?NO?, may proceed with cephalosporin use. ?  ? Sulfa Antibiotics Itching  ? ? ?Assessment/Plan: ? ?1. Hyperlipidemia -  ? ? ?Thank you, ? ? ?Ramond Dial, Pharm.D, BCPS, CPP ?[id number], [place], [place] [address]  ?Phone: [phone]; Fax: [phone]  ? ? ?

## 2021-05-26 ENCOUNTER — Telehealth (HOSPITAL_COMMUNITY): Payer: Self-pay | Admitting: *Deleted

## 2021-05-26 NOTE — Telephone Encounter (Signed)
Pt called stating someone in our office called her about starting a new medication. Will follow up with clinic staff as there are no notes about new med.  ?

## 2021-06-14 ENCOUNTER — Encounter: Payer: Self-pay | Admitting: *Deleted

## 2021-06-20 ENCOUNTER — Telehealth: Payer: Self-pay

## 2021-06-20 DIAGNOSIS — I255 Ischemic cardiomyopathy: Secondary | ICD-10-CM

## 2021-06-20 MED ORDER — METOPROLOL SUCCINATE ER 50 MG PO TB24: 50.0000 mg | ORAL_TABLET | Freq: Two times a day (BID) | ORAL | 3 refills | Status: DC

## 2021-06-20 NOTE — Telephone Encounter (Signed)
[facility] alert for HV therapy ?12 NSVT, 4 SVT 4/3 between 15:27-18:31, EGM's show tachy irregular R-R with periods of regularity. 4/3 @ 18:37 VT-2 also showing irregular R-R, followed by increased regularity, HR 214 falling in to VT-2. ATP delivered x4 followed by HV therapy 30J slowing to irregular VS ~115. Known AF, Eliquis, Digoxin, Metoprolol. ? ?Reviewed with Dr. Lovena Le and reports irregular R-R interval, inappropriate shock. Verbal order obtained to increase Toprol to 50 mg BID.  ? ?Patient called, reports she was taking groceries into her house became tired, sat down then received ICD shock. States she is compliant with medications. Advised per Dr. Lovena Le to increase Toprol to 50 mg BID. Patient voiced understanding. Reminded of upcoming apt 07/20/21 with Dr. Curt Bears. Patient advised to call if further questions or concerns arise. Voiced understanding. ? ? ? ? ? ? ? ? ? ? ? ? ? ? ?

## 2021-06-26 ENCOUNTER — Telehealth (HOSPITAL_COMMUNITY): Payer: Self-pay

## 2021-06-26 NOTE — Telephone Encounter (Signed)
Called and was unable to leave a voice message to confirm/remind patient of their appointment at the [facility] on 06/27/21.  ? ?

## 2021-06-26 NOTE — Progress Notes (Incomplete)
PCP: Pcp, No ?Cardiology: Dr. Tamala Julian ?HF Cardiology: Dr. Aundra Dubin ? ?81 y.o. with history of CAD, chronic systolic CHF, CKD stage 3 and atrial fibrillation was referred by Dr. Tamala Julian for evaluation of CHF.  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  She developed ischemic cardiomyopathy, last echo in 1/22 showed EF 25-30%.  She now has a [employer] officer, [employer] ICD. Patient also has persistent atrial fibrillation.  She was unable to take amiodarone in the past due to nausea/vomiting.   ? ?RHC was done in 10/22, showing mildly elevated right and left heart filling pressures and CI 1.96.  Lasix was increased to 40 mg daily.   ? ?She had atrial fibrillation ablation in 1/23 and is in NSR today.  TEE at AF ablation showed EF 25-30%.  ? ?Follow up 2/23.She returns for followup of CHF.  Weight is down 1 lb.  She is short of breath with "heavy exertion."  No chest pain.  Lightheaded if she stands up fast after having been sitting or lying down for a while.  No orthopnea/PND.   ? ?Labs (5/22): K 4.4, creatinine 1.47 ?Labs (11/22): K 4.1, creatinine 1.83 => 2.17 ?Labs (1/23): digoxin <0.4, LDL 131, K 4.4, creatinine 1.71 ?Labs 92/23): K 4.4, creatinine 1.83 ? ?St Jude device interrogation: thoracic impedance stable, no VT.  ? ?ECG (personally reviewed): NSR, PVC, nonspecific ST changes ? ?PMH: ?1. CAD: Anterior MI in 4/21.  LHC with 99% pLAD, 70% pRCA, 80%, OM1, 60% mLCx.  Patient had PCI to pLAD.   ?2. Chronic systolic CHF: Ischemic cardiomyopathy.  St Jude ICD.  ?- Echo (1/22): EF 25-30%, RV normal.  ?- RHC (10/22): mean RA 10, PA 37/16 mean 26, mean PCWP 23, CI 1.96 ?3. CKD stage 3 ?4. Type 2 diabetes ?5. Hyperlipidemia: Myalgias with Crestor and Lipitor.  ?6. Atrial fibrillation: Persistent.  ?- Nausea/vomiting with amiodarone.  ?- Atrial fibrillation ablation 1/23.  ?7. H/o VT ?8. COVID-19 in 1/22 ? ?Social History  ? ?Socioeconomic History  ? Marital status: Married  ?  Spouse name: Not on file  ? Number of children:  Not on file  ? Years of education: Not on file  ? Highest education level: Not on file  ?Occupational History  ? Not on file  ?Tobacco Use  ? Smoking status: Never  ? Smokeless tobacco: Former  ?  Types: Chew  ? Tobacco comments:  ?  Former chew tobacco 05/03/21  ?Vaping Use  ? Vaping Use: Never used  ?Substance and Sexual Activity  ? Alcohol use: Not Currently  ? Drug use: Never  ? Sexual activity: Not on file  ?Other Topics Concern  ? Not on file  ?Social History Narrative  ? ** Merged History Encounter **  ?    ? ?Social Determinants of Health  ? ?Financial Resource Strain: Not on file  ?Food Insecurity: Not on file  ?Transportation Needs: Not on file  ?Physical Activity: Not on file  ?Stress: Not on file  ?Social Connections: Not on file  ?Intimate Partner Violence: Not on file  ? ?Family History  ?Problem Relation Age of Onset  ? Heart disease Mother   ? ?ROS: All systems reviewed and negative except as per HPI.  ? ?Current Outpatient Medications  ?Medication Sig Dispense Refill  ? apixaban (ELIQUIS) 5 MG TABS tablet Take 1 tablet (5 mg total) by mouth daily. 60 tablet 11  ? clopidogrel (PLAVIX) 75 MG tablet Take 1 tablet (75 mg total) by  mouth daily before breakfast. 90 tablet 1  ? dapagliflozin propanediol (FARXIGA) 10 MG TABS tablet Take 1 tablet (10 mg total) by mouth daily before breakfast. 30 tablet 3  ? digoxin (LANOXIN) 0.125 MG tablet Take 0.5 tablets (0.0625 mg total) by mouth 3 (three) times a week. Taking Monday, Wednesday, and Friday. 45 tablet 3  ? furosemide (LASIX) 20 MG tablet Take 1 tablet (20 mg total) by mouth daily. 120 tablet 3  ? levothyroxine (SYNTHROID) 25 MCG tablet Take 1 tablet (25 mcg total) by mouth daily before breakfast. 90 tablet 3  ? metoprolol succinate (TOPROL-XL) 50 MG 24 hr tablet Take 1 tablet (50 mg total) by mouth in the morning and at bedtime. 90 tablet 3  ? nitroGLYCERIN (NITROSTAT) 0.4 MG SL tablet Place 1 tablet (0.4 mg total) under the tongue every 5 (five) minutes  as needed. 25 tablet 2  ? nystatin (MYCOSTATIN/NYSTOP) powder Apply 1 application topically 2 (two) times daily. 15 g 0  ? potassium chloride (KLOR-CON) 10 MEQ tablet Take 1 tablet (10 mEq total) by mouth 2 (two) times daily. May take 1 extra tablet by mouth for 2-3 pound weight gain overnight, shortness of breath, edema    ? sacubitril-valsartan (ENTRESTO) 24-26 MG Take 1 tablet by mouth 2 (two) times daily. 180 tablet 3  ? simvastatin (ZOCOR) 20 MG tablet TAKE 1 TABLET(20 MG) BY MOUTH EVERY EVENING 90 tablet 3  ? spironolactone (ALDACTONE) 25 MG tablet Take 1 tablet (25 mg total) by mouth at bedtime. 30 tablet 11  ? ?No current facility-administered medications for this visit.  ? ?There were no vitals taken for this visit. ? ?Physical Exam: ?General:  NAD. No resp difficulty ?HEENT: Normal ?Neck: Supple. No JVD. Carotids 2+ bilat; no bruits. No lymphadenopathy or thryomegaly appreciated. ?Cor: PMI nondisplaced. Regular rate & rhythm. No rubs, gallops or murmurs. ?Lungs: Clear ?Abdomen: Soft, nontender, nondistended. No hepatosplenomegaly. No bruits or masses. Good bowel sounds. ?Extremities: No cyanosis, clubbing, rash, edema ?Neuro: Alert & oriented x 3, cranial nerves grossly intact. Moves all 4 extremities w/o difficulty. Affect pleasant. ? ?Assessment/Plan: ?1. CAD: Patient has history of anterior MI in 4/21 with PCI to the proximal LAD.  No chest pain.  ?- She is on apixaban and Plavix.  Dr. Tamala Julian has wanted her to continue Plavix.    ?- She is on simvastatin 20 mg daily and cannot take higher dose or other statins due to myalgias.  LDL is too high, she has been referred to lipid clinic for [place].  ?2. Chronic systolic CHF: Ischemic cardiomyopathy with St Jude ICD.  Last echo in 1/22 with EF 25-30%.  RHC in 10/22 showed low cardiac index at 1.96 and mildly increased filling pressures.  Currently, NYHA class II.  By exam and [place], she is not significantly volume overloaded.   ?- Continue Toprol XL 50 mg  bid. ?- Continue Entresto 24/26 mg bid.   ?- Continue digoxin 0.0625 mg MWF, check level today.  ?- Continue spironolactone 25 mg daily, BMET today. ?- Continue dapagliflozin 10 mg daily.   ?- Continue Lasix 40 mg daily.  ?- Narrow QRS, not CRT candidate.  ?3. Atrial fibrillation: Paroxysmal.  She did not tolerate amiodarone.  Poor candidate for Tikosyn with CKD. She had AF ablation in 1/23 and is in NSR today.  ?- Continue Eliquis 5 mg bid. No bleeding issues.  ?4. CKD: Stage 3.  BMET today.  ? ?Follow up 3 months with Dr. Aundra Dubin. ? ?Maricela Bo [facility]  FNP-BC ?06/26/2021 ? ?

## 2021-06-27 ENCOUNTER — Encounter (HOSPITAL_COMMUNITY): Payer: Medicare Other

## 2021-07-03 ENCOUNTER — Other Ambulatory Visit: Payer: Self-pay | Admitting: *Deleted

## 2021-07-03 MED ORDER — ENTRESTO 24-26 MG PO TABS: 1.0000 | ORAL_TABLET | Freq: Two times a day (BID) | ORAL | 0 refills | Status: DC

## 2021-07-20 ENCOUNTER — Encounter: Payer: No Typology Code available for payment source | Admitting: Cardiology

## 2021-07-23 NOTE — Progress Notes (Deleted)
Cardiology Office Note:    Date:  07/23/2021   ID:  Terry Hanson, DOB May 28, 1940, MRN [id number]  PCP:  Pcp, No  Cardiologist:  Sinclair Grooms, MD   Referring MD: No ref. provider found   No chief complaint on file.   History of Present Illness:    Terry Hanson is a 81 y.o. female with a hx of CAD with DES to proximal LAD during anterior MI, chronic systolic CHF EF [id number], CKD stage 3, DM II,  persistent atrial fibrillation (unable to tolerate amiodarone), and a St Jude ICD.   ***  Past Medical History:  Diagnosis Date   Arthritis    Asthma    CHF (congestive heart failure) ([place])    Diabetes mellitus ([place])    Diabetes mellitus without complication ([place])    Enlarged heart    Hypercholesterolemia    Hyperlipidemia    Ischemic cardiomyopathy    a. EF 30-35% by echo in 06/2019   STEMI (ST elevation myocardial infarction) ([place])    a. 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    Past Surgical History:  Procedure Laterality Date   ABDOMINAL HYSTERECTOMY     ATRIAL FIBRILLATION ABLATION N/A 04/05/2021   Procedure: [place];  Surgeon: Constance Haw, MD;  Location: [facility];  Service: Cardiovascular;  Laterality: N/A;   CATARACT EXTRACTION W/PHACO Right 06/27/2020   Procedure: CATARACT EXTRACTION PHACO AND INTRAOCULAR LENS PLACEMENT RIGHT EYE AND PLACEMENT OF CORTICOSTEROID;  Surgeon: Baruch Goldmann, MD;  Location: [facility];  Service: Ophthalmology;  Laterality: Right;  right CDE=25.47   CORONARY/GRAFT ACUTE MI REVASCULARIZATION N/A 06/29/2019   Procedure: Coronary/Graft Acute MI Revascularization;  Surgeon: Belva Crome, MD;  Location: [facility];  Service: Cardiovascular;  Laterality: N/A;   ICD IMPLANT N/A 12/14/2019   Procedure: ICD IMPLANT;  Surgeon: Evans Lance, MD;  Location: [facility];  Service: Cardiovascular;  Laterality: N/A;   LEFT HEART CATH AND CORONARY ANGIOGRAPHY N/A 06/29/2019   Procedure: LEFT HEART CATH  AND CORONARY ANGIOGRAPHY;  Surgeon: Belva Crome, MD;  Location: [facility];  Service: Cardiovascular;  Laterality: N/A;   RIGHT HEART CATH N/A 12/30/2020   Procedure: RIGHT HEART CATH;  Surgeon: Larey Dresser, MD;  Location: [facility];  Service: Cardiovascular;  Laterality: N/A;   TEE WITHOUT CARDIOVERSION N/A 04/04/2021   Procedure: TRANSESOPHAGEAL ECHOCARDIOGRAM (TEE);  Surgeon: Jerline Pain, MD;  Location: [facility];  Service: Cardiovascular;  Laterality: N/A;    Current Medications: No outpatient medications have been marked as taking for the 07/24/21 encounter (Appointment) with Belva Crome, MD.     Allergies:   Codeine, Shrimp [shellfish allergy], Amiodarone, Penicillins, and Sulfa antibiotics   Social History   Socioeconomic History   Marital status: Married    Spouse name: Not on file   Number of children: Not on file   Years of education: Not on file   Highest education level: Not on file  Occupational History   Not on file  Tobacco Use   Smoking status: Never   Smokeless tobacco: Former    Types: Chew   Tobacco comments:    Former chew tobacco 05/03/21  Vaping Use   Vaping Use: Never used  Substance and Sexual Activity   Alcohol use: Not Currently   Drug use: Never   Sexual activity: Not on file  Other Topics Concern   Not on file  Social  History Narrative   ** Merged History Encounter **       Social Determinants of Health   Financial Resource Strain: Not on file  Food Insecurity: Not on file  Transportation Needs: Not on file  Physical Activity: Not on file  Stress: Not on file  Social Connections: Not on file     Family History: The patient's family history includes Heart disease in her mother.  ROS:   Please see the history of present illness.    *** All other systems reviewed and are negative.  EKGs/Labs/Other Studies Reviewed:    The following studies were reviewed today: ***  EKG:  EKG ***  Recent  Labs: 01/05/2021: Magnesium 2.4; TSH 2.086 01/19/2021: B Natriuretic Peptide 565.8 03/24/2021: ALT 6; NT-Pro BNP 3,560 04/27/2021: BUN 24; Creatinine, Ser 1.83; Hemoglobin 11.8; Platelets 216; Potassium 4.4; Sodium 140  Recent Lipid Panel    Component Value Date/Time   CHOL 201 (H) 03/24/2021 0754   TRIG 187 (H) 03/24/2021 0754   HDL 36 (L) 03/24/2021 0754   CHOLHDL 5.6 (H) 03/24/2021 0754   CHOLHDL 5.5 02/28/2021 1110   VLDL 24 02/28/2021 1110   LDLCALC 131 (H) 03/24/2021 0754    Physical Exam:    VS:  There were no vitals taken for this visit.    Wt Readings from Last 3 Encounters:  05/03/21 184 lb 12.8 oz (83.8 kg)  04/27/21 187 lb 9.6 oz (85.1 kg)  04/05/21 188 lb 12.8 oz (85.6 kg)     GEN: ***. No acute distress HEENT: Normal NECK: No JVD. LYMPHATICS: No lymphadenopathy CARDIAC: *** murmur. RRR *** gallop, or edema. VASCULAR: *** Normal Pulses. No bruits. RESPIRATORY:  Clear to auscultation without rales, wheezing or rhonchi  ABDOMEN: Soft, non-tender, non-distended, No pulsatile mass, MUSCULOSKELETAL: No deformity  SKIN: Warm and dry NEUROLOGIC:  Alert and oriented x 3 PSYCHIATRIC:  Normal affect   ASSESSMENT:    1. Persistent atrial fibrillation ([place])   2. Secondary hypercoagulable state ([place])   3. Chronic systolic CHF (congestive heart failure) ([place])   4. Hypercholesterolemia   5. Tobacco abuse   6. Coronary artery disease involving native coronary artery of native heart without angina pectoris   7. Stage 3 chronic kidney disease, unspecified whether stage 3a or 3b CKD ([place])   8. Hyperlipidemia, unspecified hyperlipidemia type    PLAN:    In order of problems listed above:  ***   Medication Adjustments/Labs and Tests Ordered: Current medicines are reviewed at length with the patient today.  Concerns regarding medicines are outlined above.  No orders of the defined types were placed in this encounter.  No orders of the defined types were placed in this  encounter.   There are no Patient Instructions on file for this visit.   Signed, Sinclair Grooms, MD  07/23/2021 8:42 AM    [place]

## 2021-07-24 ENCOUNTER — Ambulatory Visit: Payer: 59 | Admitting: Interventional Cardiology

## 2021-07-31 ENCOUNTER — Ambulatory Visit (INDEPENDENT_AMBULATORY_CARE_PROVIDER_SITE_OTHER): Payer: No Typology Code available for payment source

## 2021-07-31 DIAGNOSIS — I255 Ischemic cardiomyopathy: Secondary | ICD-10-CM

## 2021-08-02 LAB — CUP PACEART REMOTE DEVICE CHECK
Battery Remaining Longevity: 103 mo
Battery Remaining Percentage: 83 %
Battery Voltage: 2.99 V
Brady Statistic RV Percent Paced: 1 %
Date Time Interrogation Session: 20230515052554
HighPow Impedance: 69 Ohm
Implantable Lead Implant Date: 20210927
Implantable Lead Location: 753860
Implantable Pulse Generator Implant Date: 20210927
Lead Channel Impedance Value: 400 Ohm
Lead Channel Pacing Threshold Amplitude: 0.75 V
Lead Channel Pacing Threshold Pulse Width: 0.5 ms
Lead Channel Sensing Intrinsic Amplitude: 11.6 mV
Lead Channel Setting Pacing Amplitude: 2.5 V
Lead Channel Setting Pacing Pulse Width: 0.5 ms
Lead Channel Setting Sensing Sensitivity: 0.5 mV
Pulse Gen Serial Number: 810006603

## 2021-08-15 ENCOUNTER — Other Ambulatory Visit: Payer: Self-pay | Admitting: Interventional Cardiology

## 2021-08-16 ENCOUNTER — Other Ambulatory Visit: Payer: Self-pay | Admitting: *Deleted

## 2021-08-16 MED ORDER — DIGOXIN 125 MCG PO TABS: 0.0625 mg | ORAL_TABLET | ORAL | 0 refills | Status: DC

## 2021-08-16 MED ORDER — ENTRESTO 24-26 MG PO TABS: 1.0000 | ORAL_TABLET | Freq: Two times a day (BID) | ORAL | 0 refills | Status: DC

## 2021-08-16 NOTE — Addendum Note (Signed)
Addended by: Gaetano Net on: 08/16/2021 06:56 AM   Modules accepted: Orders

## 2021-08-17 NOTE — Progress Notes (Signed)
Remote ICD transmission.   

## 2021-09-04 ENCOUNTER — Other Ambulatory Visit: Payer: Self-pay

## 2021-09-04 MED ORDER — POTASSIUM CHLORIDE ER 10 MEQ PO TBCR: 10.0000 meq | EXTENDED_RELEASE_TABLET | Freq: Two times a day (BID) | ORAL | 0 refills | Status: DC

## 2021-09-05 ENCOUNTER — Other Ambulatory Visit: Payer: Self-pay

## 2021-09-05 MED ORDER — ENTRESTO 24-26 MG PO TABS: 1.0000 | ORAL_TABLET | Freq: Two times a day (BID) | ORAL | 0 refills | Status: DC

## 2021-09-05 MED ORDER — POTASSIUM CHLORIDE ER 10 MEQ PO TBCR: 10.0000 meq | EXTENDED_RELEASE_TABLET | Freq: Two times a day (BID) | ORAL | 5 refills | Status: DC

## 2021-09-05 MED ORDER — POTASSIUM CHLORIDE ER 10 MEQ PO TBCR: 10.0000 meq | EXTENDED_RELEASE_TABLET | Freq: Two times a day (BID) | ORAL | 1 refills | Status: DC

## 2021-09-05 NOTE — Addendum Note (Signed)
Addended by: Margaret Pyle D on: 09/05/2021 02:52 PM   Modules accepted: Orders

## 2021-09-05 NOTE — Addendum Note (Signed)
Addended by: Margaret Pyle D on: 09/05/2021 09:06 AM   Modules accepted: Orders

## 2021-09-17 IMAGING — CR DG CHEST 2V
2 series · 2 of 2 positions shown · non-contrast
Comparison: 07/08/2019

CLINICAL DATA: ICD placement

EXAM:
CHEST - 2 VIEW

[w chest pa]
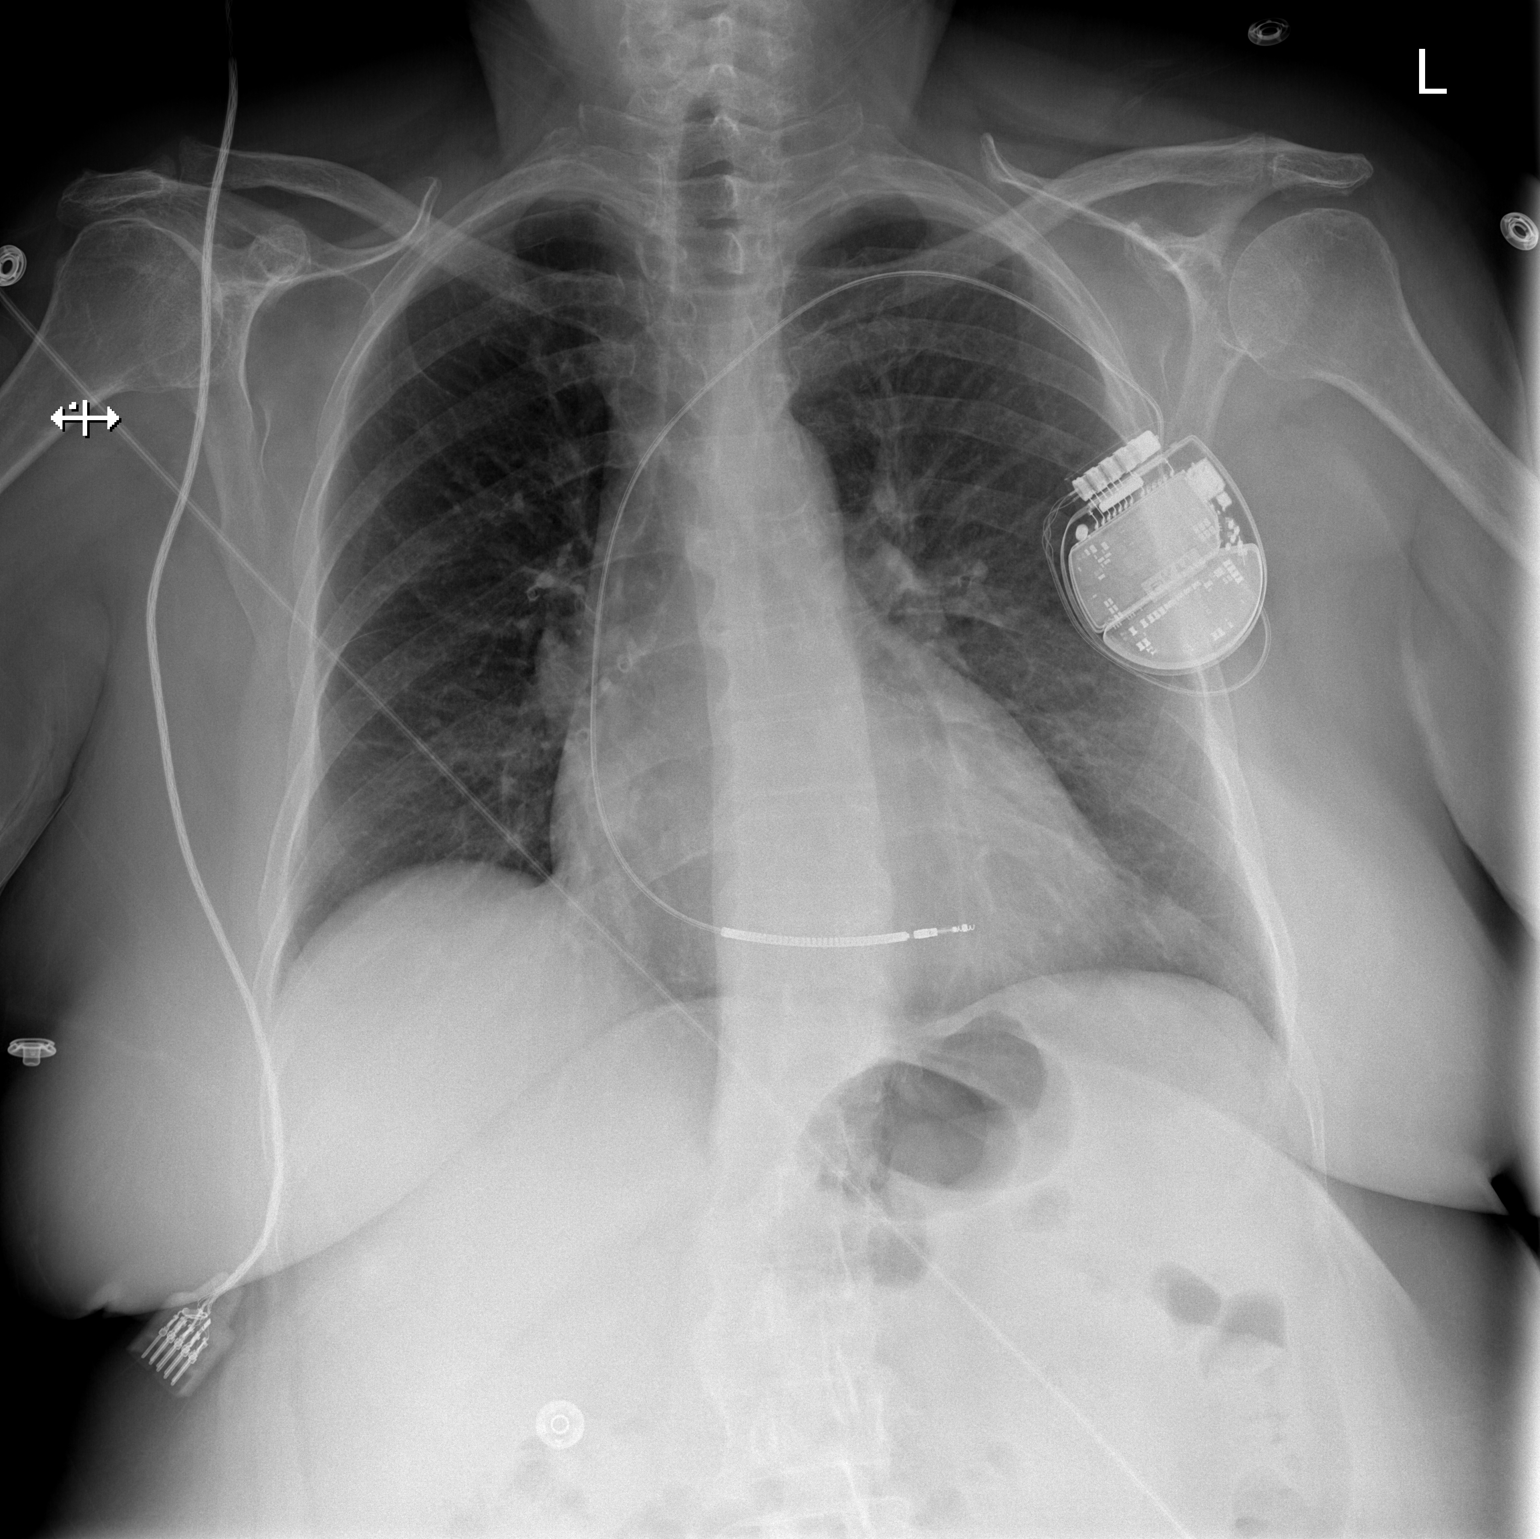

[w chest lat]
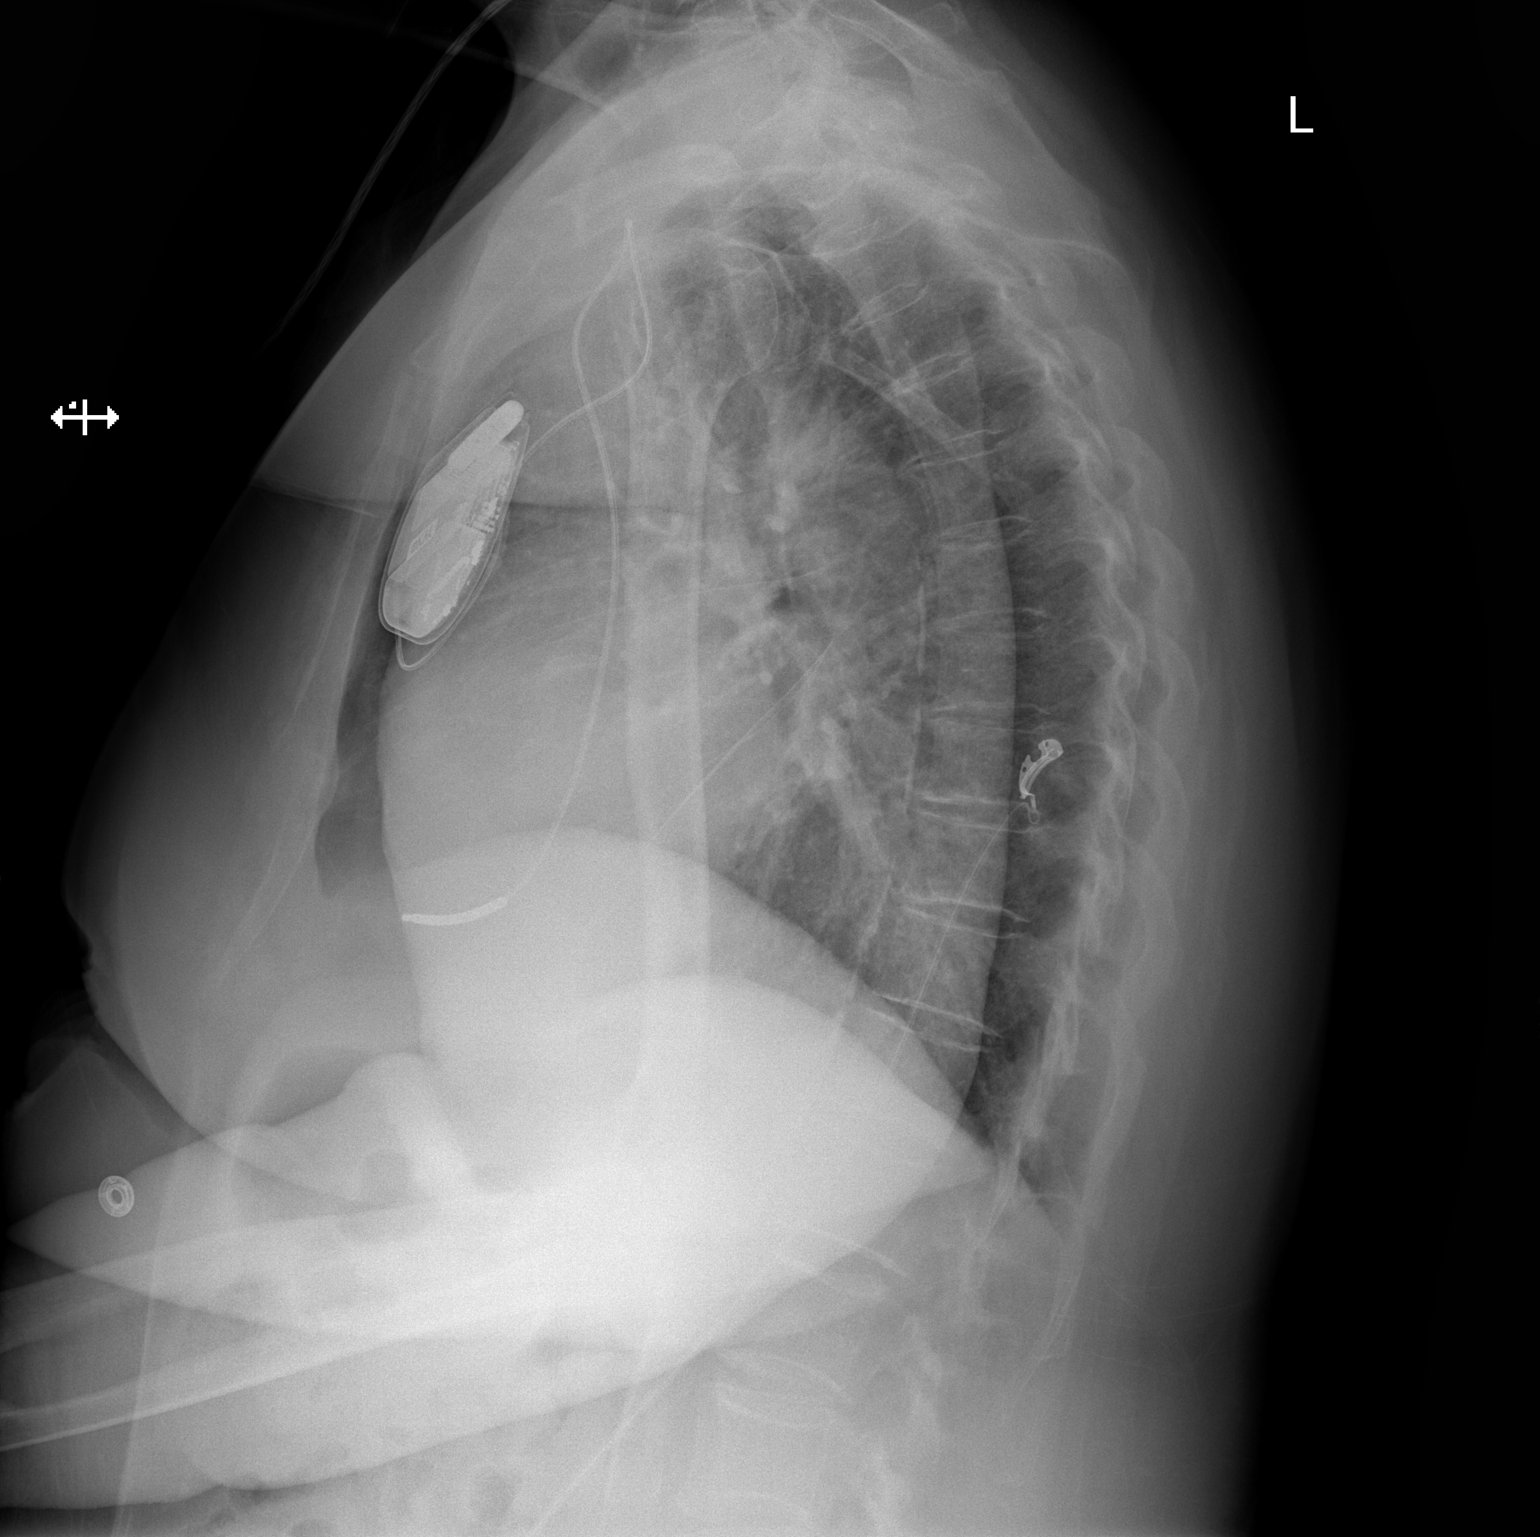

[2 of 2 positions shown; findings below may reference images not displayed]

FINDINGS: Interval placement of left subclavian ICD. Lead in the right
ventricle. No pneumothorax

Lungs are clear. Left coronary stent. Heart size and vascularity
normal.
IMPRESSION: ICD placement into the right ventricle. No pneumothorax. Lungs are
clear.

## 2021-10-07 NOTE — Progress Notes (Unsigned)
Cardiology Office Note:    Date:  10/09/2021   ID:  Terry Hanson, DOB October 10, 1940, MRN [id number]  PCP:  Pcp, No  Cardiologist:  Sinclair Grooms, MD   Referring MD: No ref. provider found   Chief Complaint  Patient presents with   Coronary Artery Disease   Congestive Heart Failure   Atrial Fibrillation    History of Present Illness:    Terry Hanson is a 81 y.o. female with a hx of CAD, chronic systolic CHF, hyperlipidemia, DM II, CKD stage 3, atrial fibrillation with ablation 03/2021 by Dr. 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     Complaining about everything other than any heart complaints.  Since ablation and medication adjustment, shortness of breath and fatigue is improved.  She walked her mailbox.  States she had an AICD discharge although I do not think it was recent.  She follows with Dr. Cristopher Peru.  No recent laboratory data.  My last visit with her was prior to the ablation performed for atrial fibrillation on April 05, 2021  Past Medical History:  Diagnosis Date   Arthritis    Asthma    CHF (congestive heart failure) ([place])    Diabetes mellitus ([place])    Diabetes mellitus without complication ([place])    Enlarged heart    Hypercholesterolemia    Hyperlipidemia    Ischemic cardiomyopathy    a. EF 30-35% by echo in 06/2019   STEMI (ST elevation myocardial infarction) ([place])    a. 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    Past Surgical History:  Procedure Laterality Date   ABDOMINAL HYSTERECTOMY     ATRIAL FIBRILLATION ABLATION N/A 04/05/2021   Procedure: [place];  Surgeon: Constance Haw, MD;  Location: [place] [facility];  Service: Cardiovascular;  Laterality: N/A;   CATARACT EXTRACTION W/PHACO Right 06/27/2020   Procedure: CATARACT EXTRACTION PHACO AND INTRAOCULAR LENS PLACEMENT RIGHT EYE AND PLACEMENT OF CORTICOSTEROID;   Surgeon: Baruch Goldmann, MD;  Location: [facility];  Service: Ophthalmology;  Laterality: Right;  right CDE=25.47   CORONARY/GRAFT ACUTE MI REVASCULARIZATION N/A 06/29/2019   Procedure: Coronary/Graft Acute MI Revascularization;  Surgeon: Belva Crome, MD;  Location: [place] [facility];  Service: Cardiovascular;  Laterality: N/A;   ICD IMPLANT N/A 12/14/2019   Procedure: ICD IMPLANT;  Surgeon: Evans Lance, MD;  Location: [place] [facility];  Service: Cardiovascular;  Laterality: N/A;   LEFT HEART CATH AND CORONARY ANGIOGRAPHY N/A 06/29/2019   Procedure: LEFT HEART CATH AND CORONARY ANGIOGRAPHY;  Surgeon: Belva Crome, MD;  Location: [place] [facility];  Service: Cardiovascular;  Laterality: N/A;   RIGHT HEART CATH N/A 12/30/2020   Procedure: RIGHT HEART CATH;  Surgeon: Larey Dresser, MD;  Location: [place] [facility];  Service: Cardiovascular;  Laterality: N/A;   TEE WITHOUT CARDIOVERSION N/A 04/04/2021   Procedure: TRANSESOPHAGEAL ECHOCARDIOGRAM (TEE);  Surgeon: Jerline Pain, MD;  Location: [facility];  Service: Cardiovascular;  Laterality: N/A;    Current Medications: Current Meds  Medication Sig   apixaban (ELIQUIS) 5 MG TABS tablet Take 1 tablet (5 mg total) by mouth daily.   clopidogrel (PLAVIX) 75 MG tablet Take 1 tablet (75 mg total) by mouth daily before breakfast.   dapagliflozin propanediol (FARXIGA) 10 MG TABS tablet Take 1 tablet (10 mg total) by mouth daily before breakfast.   digoxin (  LANOXIN) 0.125 MG tablet Take 0.5 tablets (0.0625 mg total) by mouth 3 (three) times a week. Taking Monday, Wednesday, and Friday.   furosemide (LASIX) 20 MG tablet Take 1 tablet (20 mg total) by mouth daily.   levothyroxine (SYNTHROID) 25 MCG tablet TAKE 1 TABLET(25 MCG) BY MOUTH DAILY BEFORE AND BREAKFAST   metoprolol succinate (TOPROL-XL) 50 MG 24 hr tablet Take 1 tablet (50 mg total) by mouth in the morning and at bedtime.   nitroGLYCERIN (NITROSTAT) 0.4 MG SL tablet Place 1 tablet (0.4  mg total) under the tongue every 5 (five) minutes as needed.   potassium chloride (KLOR-CON) 10 MEQ tablet Take 1 tablet (10 mEq total) by mouth 2 (two) times daily. May take 1 extra tablet by mouth for 2-3 pound weight gain overnight, shortness of breath, edema.   sacubitril-valsartan (ENTRESTO) 24-26 MG Take 1 tablet by mouth 2 (two) times daily. Please keep upcoming appointment in May for future refills. Thank you   simvastatin (ZOCOR) 20 MG tablet TAKE 1 TABLET(20 MG) BY MOUTH EVERY EVENING   spironolactone (ALDACTONE) 25 MG tablet Take 1 tablet (25 mg total) by mouth at bedtime.   [DISCONTINUED] nystatin (MYCOSTATIN/NYSTOP) powder Apply 1 application topically 2 (two) times daily.     Allergies:   Codeine, Shrimp [shellfish allergy], Amiodarone, Penicillins, and Sulfa antibiotics   Social History   Socioeconomic History   Marital status: Married    Spouse name: Not on file   Number of children: Not on file   Years of education: Not on file   Highest education level: Not on file  Occupational History   Not on file  Tobacco Use   Smoking status: Never   Smokeless tobacco: Former    Types: Chew   Tobacco comments:    Former chew tobacco 05/03/21  Vaping Use   Vaping Use: Never used  Substance and Sexual Activity   Alcohol use: Not Currently   Drug use: Never   Sexual activity: Not on file  Other Topics Concern   Not on file  Social History Narrative   ** Merged History Encounter **       Social Determinants of Health   Financial Resource Strain: Not on file  Food Insecurity: Not on file  Transportation Needs: Not on file  Physical Activity: Not on file  Stress: Not on file  Social Connections: Not on file     Family History: The patient's family history includes Heart disease in her mother.  ROS:   Please see the history of present illness.    There is a chart message from 09/26/2021 with concern about the home monitor not communicating with the office system.  I  cannot tell that this problem has been resolved.  All other systems reviewed and are negative.  EKGs/Labs/Other Studies Reviewed:    The following studies were reviewed today:  ECHOCARDIOGRAPHY 03/24/2021: IMPRESSIONS   1. Left ventricular ejection fraction, by estimation, is 25 to 30%. The  left ventricle has severely decreased function. The left ventricle  demonstrates regional wall motion abnormalities (see scoring  diagram/findings for description). All septal and  anterior LV segments and the apex are akinetic. All inferior LV segments  and the apical lateral segment is hypokinetic. Diastolic function  indeterminant due to Afib.   2. Right ventricular systolic function is normal. The right ventricular  size is normal.   3. The mitral valve is abnormal. Mild mitral valve regurgitation.   4. The aortic valve is tricuspid. There is moderate  calcification of the  aortic valve. There is moderate thickening of the aortic valve. Aortic  valve regurgitation is trivial. Aortic valve sclerosis/calcification is  present, without any evidence of  aortic stenosis.   5. The inferior vena cava is normal in size with greater than 50%  respiratory variability, suggesting right atrial pressure of 3 mmHg.   Comparison(s): Compared to prior study on 09/23/2019, EF on current study  is 25-30% and was previously 30-35%.   Coronary angiography/12/21: Diagnostic Dominance: Right  Intervention      EKG:  EKG 05/03/2021 with SB, RAD, and low voltage.  Recent Labs: 01/05/2021: Magnesium 2.4; TSH 2.086 01/19/2021: B Natriuretic Peptide 565.8 03/24/2021: ALT 6; NT-Pro BNP 3,560 04/27/2021: BUN 24; Creatinine, Ser 1.83; Hemoglobin 11.8; Platelets 216; Potassium 4.4; Sodium 140  Recent Lipid Panel    Component Value Date/Time   CHOL 201 (H) 03/24/2021 0754   TRIG 187 (H) 03/24/2021 0754   HDL 36 (L) 03/24/2021 0754   CHOLHDL 5.6 (H) 03/24/2021 0754   CHOLHDL 5.5 02/28/2021 1110   VLDL 24  02/28/2021 1110   LDLCALC 131 (H) 03/24/2021 0754    Physical Exam:    VS:  BP 102/62   Pulse 68   Ht $R'5\' 2"'Hz$  (1.575 m)   Wt 188 lb (85.3 kg)   SpO2 98%   BMI 34.39 kg/m     Wt Readings from Last 3 Encounters:  10/09/21 188 lb (85.3 kg)  05/03/21 184 lb 12.8 oz (83.8 kg)  04/27/21 187 lb 9.6 oz (85.1 kg)     GEN: Obese with stable weight since February. No acute distress HEENT: Normal NECK: No JVD. LYMPHATICS: No lymphadenopathy CARDIAC: No murmur. RRR no gallop, or edema. VASCULAR:  Normal Pulses. No bruits. RESPIRATORY:  Clear to auscultation without rales, wheezing or rhonchi  ABDOMEN: Soft, non-tender, non-distended, No pulsatile mass, MUSCULOSKELETAL: No deformity  SKIN: Warm and dry NEUROLOGIC:  Alert and oriented x 3 PSYCHIATRIC:  Normal affect   ASSESSMENT:    1. Chronic systolic CHF (congestive heart failure) (HCC)   2. Persistent atrial fibrillation ([place])   3. Hypercholesterolemia   4. Coronary artery disease involving native coronary artery of native heart without angina pectoris   5. Stage 3 chronic kidney disease, unspecified whether stage 3a or 3b CKD ([place])   6. Tobacco abuse   7. ICD (implantable cardioverter-defibrillator) in place   8. Chronic anticoagulation    PLAN:    In order of problems listed above:  Functional class II-III.  She is clinically improved since I last saw her in January 2023 prior to ablation.  Continue to follow-up in the advanced heart failure clinic.  CBC and be met today.  She is now 2 years post implantation of a very long proximal to mid LAD stent.  She is on clopidogrel monotherapy.  She also takes Eliquis for paroxysmal A-fib.  She has had an ablation.  At her age, ongoing Eliquis and Plavix has an increased bleeding risk.  If still stable without bleeding at next visit in 6 months, I will consider de-escalating clopidogrel or discontinuation of Eliquis if appropriate from the standpoint of EP recommendation after successful  ablation. Last EKG performed in February demonstrated sinus rhythm after ablation.  Continue to follow-up in the device clinic Continue simvastatin. Secondary prevention reviewed.  Notify if angina. Basic metabolic panel today. Confirms abstinence from tobacco use. She has had at least 1 device discharge since I last saw her. Continue Plavix and Eliquis.  Overall education and awareness concerning primary/secondary risk prevention was discussed in detail: LDL less than 70, hemoglobin A1c less than 7, blood pressure target less than 130/80 mmHg, >150 minutes of moderate aerobic activity per week, avoidance of smoking, weight control (via diet and exercise), and continued surveillance/management of/for obstructive sleep apnea.  Consider de-escalating anticoagulation/antiplatelet regimen to either Eliquis or Plavix monotherapy.  We will query EP if there is a chance she will be able to discontinue Eliquis.  Medication Adjustments/Labs and Tests Ordered: Current medicines are reviewed at length with the patient today.  Concerns regarding medicines are outlined above.  Orders Placed This Encounter  Procedures   CBC   Basic metabolic panel   Meds ordered this encounter  Medications   nystatin (MYCOSTATIN/NYSTOP) powder    Sig: Apply 1 Application topically 2 (two) times daily.    Dispense:  15 g    Refill:  4    Patient Instructions  Medication Instructions:  Your physician recommends that you continue on your current medications as directed. Please refer to the Current Medication list given to you today.  *If you need a refill on your cardiac medications before your next appointment, please call your pharmacy*  Lab Work: TODAY: BMET, CBC If you have labs (blood work) drawn today and your tests are completely normal, you will receive your results only by: [place] (if you have MyChart) OR A paper copy in the mail If you have any lab test that is abnormal or we need to  change your treatment, we will call you to review the results.  Testing/Procedures: NONE  Follow-Up: At [facility], you and your health needs are our priority.  As part of our continuing mission to provide you with exceptional heart care, we have created designated Provider Care Teams.  These Care Teams include your primary Cardiologist (physician) and Advanced Practice Providers (APPs -  Physician Assistants and Nurse Practitioners) who all work together to provide you with the care you need, when you need it.  Your next appointment:   6 month(s)  The format for your next appointment:   In Person  Provider:   Sinclair Grooms, MD {  Other Instructions Please call call [place] at [phone] to reestablish connection with your pacemaker.  Important Information About [place]         Signed, Sinclair Grooms, MD  10/09/2021 11:21 AM    [place]

## 2021-10-09 ENCOUNTER — Encounter: Payer: Self-pay | Admitting: Interventional Cardiology

## 2021-10-09 ENCOUNTER — Ambulatory Visit (INDEPENDENT_AMBULATORY_CARE_PROVIDER_SITE_OTHER): Payer: No Typology Code available for payment source | Admitting: Interventional Cardiology

## 2021-10-09 VITALS — BP 102/62 | HR 68 | Ht 62.0 in | Wt 188.0 lb

## 2021-10-09 DIAGNOSIS — Z720 Tobacco use: Secondary | ICD-10-CM | POA: Diagnosis not present

## 2021-10-09 DIAGNOSIS — N183 Chronic kidney disease, stage 3 unspecified: Secondary | ICD-10-CM | POA: Diagnosis not present

## 2021-10-09 DIAGNOSIS — Z95810 Presence of automatic (implantable) cardiac defibrillator: Secondary | ICD-10-CM | POA: Diagnosis not present

## 2021-10-09 DIAGNOSIS — I4819 Other persistent atrial fibrillation: Secondary | ICD-10-CM

## 2021-10-09 DIAGNOSIS — E7800 Pure hypercholesterolemia, unspecified: Secondary | ICD-10-CM

## 2021-10-09 DIAGNOSIS — I2510 Atherosclerotic heart disease of native coronary artery without angina pectoris: Secondary | ICD-10-CM | POA: Diagnosis not present

## 2021-10-09 DIAGNOSIS — I5022 Chronic systolic (congestive) heart failure: Secondary | ICD-10-CM

## 2021-10-09 DIAGNOSIS — I4891 Unspecified atrial fibrillation: Secondary | ICD-10-CM

## 2021-10-09 DIAGNOSIS — Z7901 Long term (current) use of anticoagulants: Secondary | ICD-10-CM | POA: Diagnosis not present

## 2021-10-09 LAB — CBC
Hematocrit: 39.6 % (ref 34.0–46.6)
Hemoglobin: 13.2 g/dL (ref 11.1–15.9)
MCH: 29.9 pg (ref 26.6–33.0)
MCHC: 33.3 g/dL (ref 31.5–35.7)
MCV: 90 fL (ref 79–97)
Platelets: 214 10*3/uL (ref 150–450)
RBC: 4.42 x10E6/uL (ref 3.77–5.28)
RDW: 13.7 % (ref 11.7–15.4)
WBC: 7.5 10*3/uL (ref 3.4–10.8)

## 2021-10-09 LAB — BASIC METABOLIC PANEL
BUN/Creatinine Ratio: 18 (ref 12–28)
BUN: 30 mg/dL — ABNORMAL HIGH (ref 8–27)
CO2: 23 mmol/L (ref 20–29)
Calcium: 10.2 mg/dL (ref 8.7–10.3)
Chloride: 102 mmol/L (ref 96–106)
Creatinine, Ser: 1.66 mg/dL — ABNORMAL HIGH (ref 0.57–1.00)
Glucose: 137 mg/dL — ABNORMAL HIGH (ref 70–99)
Potassium: 5.2 mmol/L (ref 3.5–5.2)
Sodium: 138 mmol/L (ref 134–144)
eGFR: 31 mL/min/{1.73_m2} — ABNORMAL LOW (ref >59)

## 2021-10-09 MED ORDER — NYSTATIN 100000 UNIT/GM EX POWD: 1.0000 | Freq: Two times a day (BID) | CUTANEOUS | 4 refills | Status: DC

## 2021-10-09 NOTE — Patient Instructions (Addendum)
Medication Instructions:  Your physician recommends that you continue on your current medications as directed. Please refer to the Current Medication list given to you today.  *If you need a refill on your cardiac medications before your next appointment, please call your pharmacy*  Lab Work: TODAY: BMET, CBC If you have labs (blood work) drawn today and your tests are completely normal, you will receive your results only by: MyChart Message (if you have MyChart) OR A paper copy in the mail If you have any lab test that is abnormal or we need to change your treatment, we will call you to review the results.  Testing/Procedures: NONE  Follow-Up: At [facility], you and your health needs are our priority.  As part of our continuing mission to provide you with exceptional heart care, we have created designated Provider Care Teams.  These Care Teams include your primary Cardiologist (physician) and Advanced Practice Providers (APPs -  Physician Assistants and Nurse Practitioners) who all work together to provide you with the care you need, when you need it.  Your next appointment:   6 month(s)  The format for your next appointment:   In Person  Provider:   Lesleigh Noe, MD {  Other Instructions Please call call [facility] [employer] at [phone] to reestablish connection with your pacemaker.  Important Information About [place]

## 2021-10-18 ENCOUNTER — Encounter (HOSPITAL_COMMUNITY): Payer: Self-pay | Admitting: *Deleted

## 2021-10-18 ENCOUNTER — Other Ambulatory Visit: Payer: Self-pay

## 2021-10-18 ENCOUNTER — Emergency Department (HOSPITAL_COMMUNITY)
Admission: EM | Admit: 2021-10-18 | Discharge: 2021-10-18 | Disposition: A | Discharge status: Home / Self Care | Payer: No Typology Code available for payment source | Attending: Emergency Medicine | Admitting: Emergency Medicine

## 2021-10-18 ENCOUNTER — Emergency Department (HOSPITAL_COMMUNITY): Payer: No Typology Code available for payment source

## 2021-10-18 DIAGNOSIS — E119 Type 2 diabetes mellitus without complications: Secondary | ICD-10-CM | POA: Diagnosis not present

## 2021-10-18 DIAGNOSIS — S81802A Unspecified open wound, left lower leg, initial encounter: Secondary | ICD-10-CM | POA: Insufficient documentation

## 2021-10-18 DIAGNOSIS — M7989 Other specified soft tissue disorders: Secondary | ICD-10-CM | POA: Diagnosis not present

## 2021-10-18 DIAGNOSIS — I2510 Atherosclerotic heart disease of native coronary artery without angina pectoris: Secondary | ICD-10-CM | POA: Diagnosis not present

## 2021-10-18 DIAGNOSIS — S8992XA Unspecified injury of left lower leg, initial encounter: Secondary | ICD-10-CM | POA: Diagnosis present

## 2021-10-18 DIAGNOSIS — S81852A Open bite, left lower leg, initial encounter: Secondary | ICD-10-CM | POA: Diagnosis not present

## 2021-10-18 DIAGNOSIS — W540XXA Bitten by dog, initial encounter: Secondary | ICD-10-CM | POA: Diagnosis not present

## 2021-10-18 DIAGNOSIS — I10 Essential (primary) hypertension: Secondary | ICD-10-CM | POA: Diagnosis not present

## 2021-10-18 MED ORDER — ACETAMINOPHEN 500 MG PO TABS: 1000.0000 mg | ORAL_TABLET | Freq: Once | ORAL | Status: DC

## 2021-10-18 MED ORDER — AMOXICILLIN-POT CLAVULANATE 875-125 MG PO TABS: 1.0000 | ORAL_TABLET | Freq: Two times a day (BID) | ORAL | 0 refills | Status: DC

## 2021-10-18 NOTE — Discharge Instructions (Addendum)
You were seen today for dog bite.  You do not have any fractures of your leg.  However you will need to go on 7 days of Augmentin.  Please follow-up with a primary care provider using the attached resources for reassessment of the wound within 72 hours.  You may use Tylenol for pain. Additionally it is important that you communicate with animal control.  Our nursing staff has reached out to animal control, however if you have not heard from them within 24 hours, please call the following number to get connected and ensure that the animal does not have rabies. If you are unable to continue to observe the dog because the dog gets lost, please call the phone number immediately, proceed to the health department or return for further care and management to prevent rabies which is an untreatable lethal disease.  Animal Control - [facility] ([email or web address])  [phone].

## 2021-10-18 NOTE — ED Notes (Signed)
Wound cleansed and dressed. No active bleeding. No signs of distress. Ambulatory without difficulty

## 2021-10-18 NOTE — ED Notes (Signed)
Patient transported to x-ray. ?

## 2021-10-18 NOTE — ED Provider Notes (Signed)
[facility] EMERGENCY DEPARTMENT Provider Note   CSN: [id number] Arrival date & time: 10/18/21  1544     History Chief Complaint  Patient presents with   Animal Bite    HPI Terry Hanson is a 81 y.o. female presenting for dog bite to her left posterior calf.  Patient is an extensive medical history including diabetes hypertension, CAD.  She has no history of PAD.  She is ambulatory on the leg though she does have some central calf tenderness.  She has taken no medications prior to arrival. The dog is a family pet and is in custody.  Dog is up-to-date on vaccines. Patient denies fevers chills nausea vomiting syncope or shortness of breath.   Patient's recorded medical, surgical, social, medication list and allergies were reviewed in the Snapshot window as part of the initial history.   Review of Systems   Review of Systems  Constitutional:  Negative for chills and fever.  HENT:  Negative for ear pain and sore throat.   Eyes:  Negative for pain and visual disturbance.  Respiratory:  Negative for cough and shortness of breath.   Cardiovascular:  Negative for chest pain and palpitations.  Gastrointestinal:  Negative for abdominal pain and vomiting.  Genitourinary:  Negative for dysuria and hematuria.  Musculoskeletal:  Negative for arthralgias and back pain.  Skin:  Negative for color change and rash.  Neurological:  Negative for seizures and syncope.  All other systems reviewed and are negative.   Physical Exam Updated Vital Signs BP (!) 120/41 (BP Location: Right Arm)   Pulse (!) 56   Temp 98 F (36.7 C) (Oral)   Resp 17   Ht 5\' 2"  (1.575 m)   Wt 85 kg   SpO2 100%   BMI 34.27 kg/m  Physical Exam Vitals and nursing note reviewed.  Constitutional:      General: She is not in acute distress.    Appearance: She is well-developed.  HENT:     Head: Normocephalic and atraumatic.  Eyes:     Conjunctiva/sclera: Conjunctivae normal.  Cardiovascular:     Rate and Rhythm:  Normal rate and regular rhythm.     Heart sounds: No murmur heard. Pulmonary:     Effort: Pulmonary effort is normal. No respiratory distress.     Breath sounds: Normal breath sounds.  Abdominal:     General: There is no distension.     Palpations: Abdomen is soft.     Tenderness: There is no abdominal tenderness. There is no right CVA tenderness or left CVA tenderness.  Musculoskeletal:        General: Signs of injury present. No swelling or tenderness. Normal range of motion.     Cervical back: Neck supple.     Comments: Multiple punctate lesions to the left posterior calf.  No evidence of lacerations or larger abrasions.  Thoroughly irrigated at this time.  Normal distal pulses and neurologic function.  No firmness to the compartment.  Patient with only mild pain on palpation.  Skin:    General: Skin is warm and dry.  Neurological:     General: No focal deficit present.     Mental Status: She is alert and oriented to person, place, and time. Mental status is at baseline.     Cranial Nerves: No cranial nerve deficit.      ED Course/ Medical Decision Making/ A&P    Procedures Procedures   Medications Ordered in ED Medications - No data to display  Medical  Decision Making:    Terry Hanson is a 81 y.o. female who presented to the ED today with dog bite detailed above.     Additional history discussed with patient's family/caregivers.  Patient's presentation is complicated by their history of multiple comorbid medical problems.  Patient placed on continuous vitals and telemetry monitoring while in ED which was reviewed periodically.   Complete initial physical exam performed, notably the patient  was HDS in NAD. Wounds irrigated copiously.       Reviewed and confirmed nursing documentation for past medical history, family history, social history.    Initial Assessment:   This most consistent with an acute complicated illness. Given the presence of a dog bite, full  evaluation of the wound was performed.  It was thoroughly irrigated.  No evidence of fractured teeth on x-ray nor any underlying bony fracture.  Pain controlled with Tylenol and patient is able to ambulate. Discussed rabies prophylaxis versus observation with animal control, family requested outpatient animal observation with plan to return or follow-up with the health department if the animal is not able to be followed with animal control.  They have already received a phone call from animal control. We will start patient on Augmentin and recommended follow-up close with PCP for wound monitoring given her multiple comorbid medical problems.  Patient discharged with no further acute events stable for continued outpatient care and management.  Clinical Impression:  1. Dog bite, initial encounter         Discharge   Final Clinical Impression(s) / ED Diagnoses Final diagnoses:  Dog bite, initial encounter    Rx / DC Orders ED Discharge Orders          Ordered    amoxicillin-clavulanate (AUGMENTIN) 875-125 MG tablet  Every 12 hours        10/18/21 1733              Glyn Ade, MD 10/18/21 2349

## 2021-10-18 NOTE — ED Triage Notes (Signed)
Pt has dog bite to back of left leg; pt states the owner told her dog is up to date on immunizations

## 2021-10-20 ENCOUNTER — Encounter: Payer: Self-pay | Admitting: Interventional Cardiology

## 2021-10-20 ENCOUNTER — Telehealth: Payer: Self-pay | Admitting: Interventional Cardiology

## 2021-10-20 NOTE — Telephone Encounter (Signed)
   Pt's daughter calling to f/u her mychart message. She would like to request for Dr. Katrinka Blazing to call in antibiotics since her pcp left and dont have pcp at the moment. Pt is allergic to penicillin

## 2021-10-20 NOTE — Telephone Encounter (Signed)
Replied to Patient's MyChart message, read by patient.

## 2021-10-21 ENCOUNTER — Ambulatory Visit
Admission: EM | Admit: 2021-10-21 | Discharge: 2021-10-21 | Disposition: A | Discharge status: Home / Self Care | Payer: No Typology Code available for payment source | Attending: Nurse Practitioner | Admitting: Nurse Practitioner

## 2021-10-21 DIAGNOSIS — Z5189 Encounter for other specified aftercare: Secondary | ICD-10-CM | POA: Diagnosis not present

## 2021-10-21 DIAGNOSIS — W540XXA Bitten by dog, initial encounter: Secondary | ICD-10-CM

## 2021-10-21 MED ORDER — DOXYCYCLINE HYCLATE 100 MG PO TABS
100.0000 mg | ORAL_TABLET | Freq: Two times a day (BID) | ORAL | 0 refills | Status: DC
Start: 2021-10-21 — End: 2022-12-01

## 2021-10-21 NOTE — Discharge Instructions (Addendum)
Do not start the Augmentin.  Take doxycycline as prescribed. Continue to cleanse the area twice daily with warm water only.  Do not use hydrogen peroxide.  Have cleansed the area with warm water, apply topical antibiotic ointment. May take over-the-counter Tylenol as needed for pain or discomfort. Cool compresses to the left lower leg to help with pain or swelling. Follow-up in the emergency department immediately if you develop worsening redness or streaking that goes down the left lower leg, if you develop foul-smelling drainage, if you develop fever, chills, abdominal pain, or other concerns. Follow-up as needed.

## 2021-10-21 NOTE — ED Provider Notes (Signed)
[facility]    CSN: [id number] Arrival date & time: 10/21/21  1110      History   Chief Complaint Chief Complaint  Patient presents with   Wound Check    HPI Terry Hanson is a 81 y.o. female.   The history is provided by the patient.   Patient presents for a wound check to the left lower extremity that occurred after a dog bite approximately 3 days ago.  Patient continues to complain of tightness to the left calf.  She has been cleaning the area with peroxide and a topical antibiotic ointment.  She denies fever, chills, chest pain, shortness of breath, foul-smelling drainage, increasing redness, streaking at this time.  Patient states her daughter would like her to come here and get the wound checked.  Patient states she was prescribed Augmentin and the emergency department when she was seen, but states that she is allergic to the medication and has not started it.  Past Medical History:  Diagnosis Date   Arthritis    Asthma    CHF (congestive heart failure) (HCC)    Diabetes mellitus ([place])    Diabetes mellitus without complication ([place])    Enlarged heart    Hypercholesterolemia    Hyperlipidemia    Ischemic cardiomyopathy    a. EF 30-35% by echo in 06/2019   STEMI (ST elevation myocardial infarction) ([place])    a. 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    Patient Active Problem List   Diagnosis Date Noted   Acute respiratory failure with hypercapnia ([place]) 03/20/2020   Atrial fibrillation, chronic ([place]) 03/20/2020   COVID-19 virus infection 03/19/2020   Atrial fibrillation with RVR ([place]) 07/27/2019   CAD (coronary artery disease) 07/27/2019   CKD (chronic kidney disease) stage 3, GFR 30-59 ml/min (HCC) 07/27/2019   Nausea and vomiting 07/27/2019   Ventricular tachycardia ([place]) 07/27/2019   Acute on chronic systolic CHF (congestive heart failure) ([place]) 07/08/2019   Ischemic cardiomyopathy 07/08/2019   Hypercholesterolemia    STEMI (ST elevation  myocardial infarction) ([place])    Asthma    Acute ST elevation myocardial infarction (STEMI) due to occlusion of left anterior descending (LAD) coronary artery ([place]) [id number]   Acute systolic heart failure ([place]) 06/29/2019   DM II (diabetes mellitus, type II), controlled ([place]) 06/29/2019   Acute ST elevation myocardial infarction (STEMI) involving left anterior descending (LAD) coronary artery ([place]) 06/29/2019   Acute ST elevation myocardial infarction (STEMI) [facility])     Past Surgical History:  Procedure Laterality Date   ABDOMINAL HYSTERECTOMY     ATRIAL FIBRILLATION ABLATION N/A 04/05/2021   Procedure: ATRIAL FIBRILLATION ABLATION;  Surgeon: Constance Haw, MD;  Location: [facility];  Service: Cardiovascular;  Laterality: N/A;   CATARACT EXTRACTION W/PHACO Right 06/27/2020   Procedure: CATARACT EXTRACTION PHACO AND INTRAOCULAR LENS PLACEMENT RIGHT EYE AND PLACEMENT OF CORTICOSTEROID;  Surgeon: Baruch Goldmann, MD;  Location: [facility];  Service: Ophthalmology;  Laterality: Right;  right CDE=25.47   CORONARY/GRAFT ACUTE MI REVASCULARIZATION N/A 06/29/2019   Procedure: Coronary/Graft Acute MI Revascularization;  Surgeon: Belva Crome, MD;  Location: [facility];  Service: Cardiovascular;  Laterality: N/A;   ICD IMPLANT N/A 12/14/2019   Procedure: ICD IMPLANT;  Surgeon: Evans Lance, MD;  Location: [place] [facility];  Service: Cardiovascular;  Laterality: N/A;   LEFT HEART CATH AND CORONARY ANGIOGRAPHY N/A 06/29/2019   Procedure: LEFT HEART CATH AND CORONARY ANGIOGRAPHY;  Surgeon: Belva Crome,  MD;  Location: [facility];  Service: Cardiovascular;  Laterality: N/A;   RIGHT HEART CATH N/A 12/30/2020   Procedure: RIGHT HEART CATH;  Surgeon: Laurey Morale, MD;  Location: [facility] INV[facility]IVE [facility] LAB;  Service: Cardiovascular;  Laterality: N/A;   TEE WITHOUT CARDIOVERSION N/A 04/04/2021   Procedure: TRANSESOPHAGEAL ECHOCARDIOGRAM (TEE);  Surgeon: Jake Bathe, MD;  Location:  [facility] ENDOSCOPY;  Service: Cardiovascular;  Laterality: N/A;    OB History   No obstetric history on file.      Home Medications    Prior to Admission medications   Medication Sig Start Date End Date Taking? Authorizing Provider  doxycycline (VIBRA-TABS) 100 MG tablet Take 1 tablet (100 mg total) by mouth 2 (two) times daily. 10/21/21  Yes Elgie Maziarz-Warren, Kimbree Haber, NP  amoxicillin-clavulanate (AUGMENTIN) 875-125 MG tablet Take 1 tablet by mouth every 12 (twelve) hours. 10/18/21   Glyn Ade, MD  apixaban (ELIQUIS) 5 MG TABS tablet Take 1 tablet (5 mg total) by mouth daily. 02/03/21   Laurey Morale, MD  clopidogrel (PLAVIX) 75 MG tablet Take 1 tablet (75 mg total) by mouth daily before breakfast. 03/31/21   Lyn Records, MD  dapagliflozin propanediol (FARXIGA) 10 MG TABS tablet Take 1 tablet (10 mg total) by mouth daily before breakfast. 08/03/20   Lyn Records, MD  digoxin (LANOXIN) 0.125 MG tablet Take 0.5 tablets (0.0625 mg total) by mouth 3 (three) times a week. Taking Monday, Wednesday, and Friday. 08/16/21   Lyn Records, MD  furosemide (LASIX) 20 MG tablet Take 1 tablet (20 mg total) by mouth daily. 02/21/21   Jacklynn Ganong, FNP  levothyroxine (SYNTHROID) 25 MCG tablet TAKE 1 TABLET(25 MCG) BY MOUTH DAILY BEFORE AND BREAKFAST 08/17/21   Lyn Records, MD  metoprolol succinate (TOPROL-XL) 50 MG 24 hr tablet Take 1 tablet (50 mg total) by mouth in the morning and at bedtime. 06/20/21   Marinus Maw, MD  nitroGLYCERIN (NITROSTAT) 0.4 MG SL tablet Place 1 tablet (0.4 mg total) under the tongue every 5 (five) minutes as needed. 07/02/19   Arty Baumgartner, NP  nystatin (MYCOSTATIN/NYSTOP) powder Apply 1 Application topically 2 (two) times daily. 10/09/21   Lyn Records, MD  potassium chloride (KLOR-CON) 10 MEQ tablet Take 1 tablet (10 mEq total) by mouth 2 (two) times daily. May take 1 extra tablet by mouth for 2-3 pound weight gain overnight, shortness of breath, edema. 09/05/21    Lyn Records, MD  sacubitril-valsartan (ENTRESTO) 24-26 MG Take 1 tablet by mouth 2 (two) times daily. Please keep upcoming appointment in May for future refills. Thank you 09/05/21   Lyn Records, MD  simvastatin (ZOCOR) 20 MG tablet TAKE 1 TABLET(20 MG) BY MOUTH EVERY EVENING 04/28/21   Laurey Morale, MD  spironolactone (ALDACTONE) 25 MG tablet Take 1 tablet (25 mg total) by mouth at bedtime. 04/27/21   Laurey Morale, MD    Family History Family History  Problem Relation Age of Onset   Heart disease Mother     Social History Social History   Tobacco Use   Smoking status: Never   Smokeless tobacco: Former    Types: Chew   Tobacco comments:    Former chew tobacco 05/03/21  Vaping Use   Vaping Use: Never used  Substance Use Topics   Alcohol use: Not Currently   Drug use: Never     Allergies   Codeine, Shrimp [shellfish allergy], Amiodarone, Penicillins, and Sulfa antibiotics  Review of Systems Review of Systems Per HPI  Physical Exam Triage Vital Signs ED Triage Vitals  Enc Vitals Group     BP 10/21/21 1257 (!) 115/45     Pulse Rate 10/21/21 1257 60     Resp 10/21/21 1257 18     Temp 10/21/21 1257 97.9 F (36.6 C)     Temp Source 10/21/21 1257 Oral     SpO2 10/21/21 1257 96 %     Weight --      Height --      Head Circumference --      Peak Flow --      Pain Score 10/21/21 1256 0     Pain Loc --      Pain Edu? --      Excl. in [place]? --    No data found.  Updated Vital Signs BP (!) 115/45 (BP Location: Right Arm)   Pulse 60   Temp 97.9 F (36.6 C) (Oral)   Resp 18   SpO2 96%   Visual Acuity Right Eye Distance:   Left Eye Distance:   Bilateral Distance:    Right Eye Near:   Left Eye Near:    Bilateral Near:     Physical Exam Vitals and nursing note reviewed.  Constitutional:      General: She is not in acute distress.    Appearance: Normal appearance.  HENT:     Head: Normocephalic.  Cardiovascular:     Rate and Rhythm: Normal  rate and regular rhythm.     Pulses: Normal pulses.     Heart sounds: Normal heart sounds.  Pulmonary:     Effort: Pulmonary effort is normal.     Breath sounds: Normal breath sounds.  Abdominal:     General: Bowel sounds are normal.     Palpations: Abdomen is soft.  Skin:    General: Skin is warm and dry.     Comments: Multiple healing punctate lesions to the left posterior calf, lesions are in multiple stages of healing. Scabbing developed over lesions with no drainage, oozing, or fluctuance.  Areas remain with localized erythema.  Mild pain with palpation.   Neurological:     General: No focal deficit present.     Mental Status: She is alert and oriented to person, place, and time.  Psychiatric:        Mood and Affect: Mood normal.        Behavior: Behavior normal.      UC Treatments / Results  Labs (all labs ordered are listed, but only abnormal results are displayed) Labs Reviewed - No data to display  EKG   Radiology No results found.  Procedures Procedures (including critical care time)  Medications Ordered in UC Medications - No data to display  Initial Impression / Assessment and Plan / UC Course  I have reviewed the triage vital signs and the nursing notes.  Pertinent labs & imaging results that were available during my care of the patient were reviewed by me and considered in my medical decision making (see chart for details).  Patient presents for wound check after dog bite approximately 3 days ago.  On exam, patient has multiple lesions that have developed scabbing and are in multiple stages of healing.  There is no indication of infection as there is no redness or streaking that extends beyond the wounds or up or down the lower extremity, foul-smelling drainage, fluctuance, nor does patient have any systemic symptoms such as fever, chills,  or abdominal pain.  We will start patient on doxycycline given her allergy to penicillin, as she was previously  prescribed Augmentin.  Patient advised to stop use of hydrogen peroxide and to clean the area with warm water and the antibiotic ointment she is currently using.  Reiterated signs of infection with the patient and when to follow up.  Patient verbalizes understanding.  All questions were answered. Final Clinical Impressions(s) / UC Diagnoses   Final diagnoses:  Encounter for wound re-check  Dog bite, initial encounter     Discharge Instructions      Do not start the Augmentin.  Take doxycycline as prescribed. Continue to cleanse the area twice daily with warm water only.  Do not use hydrogen peroxide.  Have cleansed the area with warm water, apply topical antibiotic ointment. May take over-the-counter Tylenol as needed for pain or discomfort. Cool compresses to the left lower leg to help with pain or swelling. Follow-up in the emergency department immediately if you develop worsening redness or streaking that goes down the left lower leg, if you develop foul-smelling drainage, if you develop fever, chills, abdominal pain, or other concerns. Follow-up as needed.     ED Prescriptions     Medication Sig Dispense Auth. Provider   doxycycline (VIBRA-TABS) 100 MG tablet Take 1 tablet (100 mg total) by mouth 2 (two) times daily. 20 tablet Jonael Paradiso-Warren, Montzerrat Haber, NP      PDMP not reviewed this encounter.   Abran Cantor, NP 10/21/21 1327

## 2021-10-21 NOTE — ED Triage Notes (Signed)
Pt presents for wound check after being bite by her dog 3 days ago. Pt reports area is stiff and swelling. Pt reports she was seen at the ED when happened. Pt has being cleaning the wound with hydrogen peroxide.

## 2021-10-30 ENCOUNTER — Ambulatory Visit: Payer: Medicare Other

## 2021-11-01 LAB — CUP PACEART REMOTE DEVICE CHECK
Battery Remaining Longevity: 101 mo
Battery Remaining Percentage: 82 %
Battery Voltage: 2.99 V
Brady Statistic RV Percent Paced: 1 %
Date Time Interrogation Session: 20230815201315
HighPow Impedance: 71 Ohm
Implantable Lead Implant Date: 20210927
Implantable Lead Location: 753860
Implantable Pulse Generator Implant Date: 20210927
Lead Channel Impedance Value: 390 Ohm
Lead Channel Pacing Threshold Amplitude: 0.75 V
Lead Channel Pacing Threshold Pulse Width: 0.5 ms
Lead Channel Sensing Intrinsic Amplitude: 11.6 mV
Lead Channel Setting Pacing Amplitude: 2.5 V
Lead Channel Setting Pacing Pulse Width: 0.5 ms
Lead Channel Setting Sensing Sensitivity: 0.5 mV
Pulse Gen Serial Number: 810006603

## 2021-11-09 ENCOUNTER — Telehealth: Payer: Self-pay

## 2021-11-09 NOTE — Progress Notes (Signed)
[facility] Customer service manager [facility])  [facility] Quality Pharmacy Team    11/09/2021  GERILYN STARGELL Mar 18, 1941 [id number]  Reason for referral: Medication Assistance with Eliquis  Referral source:  [facility] Pharmacy Technician Current insurance:  [facility]   Outreach:  Successful telephone call with patient.  HIPAA identifiers verified.    Medication Assistance Findings:  Medication assistance needs identified: patient was screened for medication assistance eligibility for Eliquis. She does not qualify for LIS or for the Eliquis patient assistance program due to the 3% out of pocket expenditure requirement.     Plan: Will route note to patient's prescriber.   Thanks,  Harlon Flor, PharmD Clinical Pharmacist  [facility] [employer] [phone]

## 2021-11-10 ENCOUNTER — Other Ambulatory Visit: Payer: Self-pay | Admitting: Interventional Cardiology

## 2021-11-16 NOTE — Progress Notes (Signed)
[facility] Customer service manager [facility])  [facility] Quality Pharmacy Team    11/16/2021  Terry Hanson 10-19-1940 [id number]  Medication Assistance Follow-Up: I reached out to the patient today to follow-up with the patient on her ineligibility for medication assistance for Eliquis. Neither the patient nor I heard back from the prescriber, so I am re-routing this note to Dr. Katrinka Blazing for appropriate follow-up.  Thanks,  Harlon Flor, PharmD Clinical Pharmacist  [facility] [employer] [phone]

## 2021-12-17 ENCOUNTER — Other Ambulatory Visit: Payer: Self-pay | Admitting: Interventional Cardiology

## 2021-12-28 ENCOUNTER — Telehealth (HOSPITAL_COMMUNITY): Payer: Self-pay | Admitting: Pharmacy Technician

## 2021-12-28 NOTE — Telephone Encounter (Signed)
Advanced Heart Failure Patient Advocate Encounter   Patient was automatically approved to receive Farxiga from [employer] through 03/19/23.  Document scanned to chart. Patient will be notified via mail. Patient currently using [employer] assistance through previous enrollment end date. No further action needed at this time.  Arriah Wadle F Delylah Stanczyk, CPhT  

## 2022-01-25 ENCOUNTER — Other Ambulatory Visit (HOSPITAL_COMMUNITY): Payer: Self-pay | Admitting: Cardiology

## 2022-01-29 ENCOUNTER — Ambulatory Visit: Payer: No Typology Code available for payment source

## 2022-01-29 DIAGNOSIS — I5022 Chronic systolic (congestive) heart failure: Secondary | ICD-10-CM | POA: Diagnosis not present

## 2022-01-31 LAB — CUP PACEART REMOTE DEVICE CHECK
Battery Remaining Longevity: 98 mo
Battery Remaining Percentage: 80 %
Battery Voltage: 2.99 V
Brady Statistic RV Percent Paced: 1 %
Date Time Interrogation Session: 20231114234846
HighPow Impedance: 72 Ohm
Implantable Lead Connection Status: 753985
Implantable Lead Implant Date: 20210927
Implantable Lead Location: 753860
Implantable Pulse Generator Implant Date: 20210927
Lead Channel Impedance Value: 390 Ohm
Lead Channel Pacing Threshold Amplitude: 0.75 V
Lead Channel Pacing Threshold Pulse Width: 0.5 ms
Lead Channel Sensing Intrinsic Amplitude: 11.6 mV
Lead Channel Setting Pacing Amplitude: 2.5 V
Lead Channel Setting Pacing Pulse Width: 0.5 ms
Lead Channel Setting Sensing Sensitivity: 0.5 mV
Pulse Gen Serial Number: 810006603
Zone Setting Status: 755011

## 2022-02-11 IMAGING — DX DG CHEST 1V PORT
1 series · 1 of 1 positions shown · non-contrast
Comparison: 05/27/2018

CLINICAL DATA: Chest pain

EXAM:
PORTABLE CHEST 1 VIEW

[chest ap]
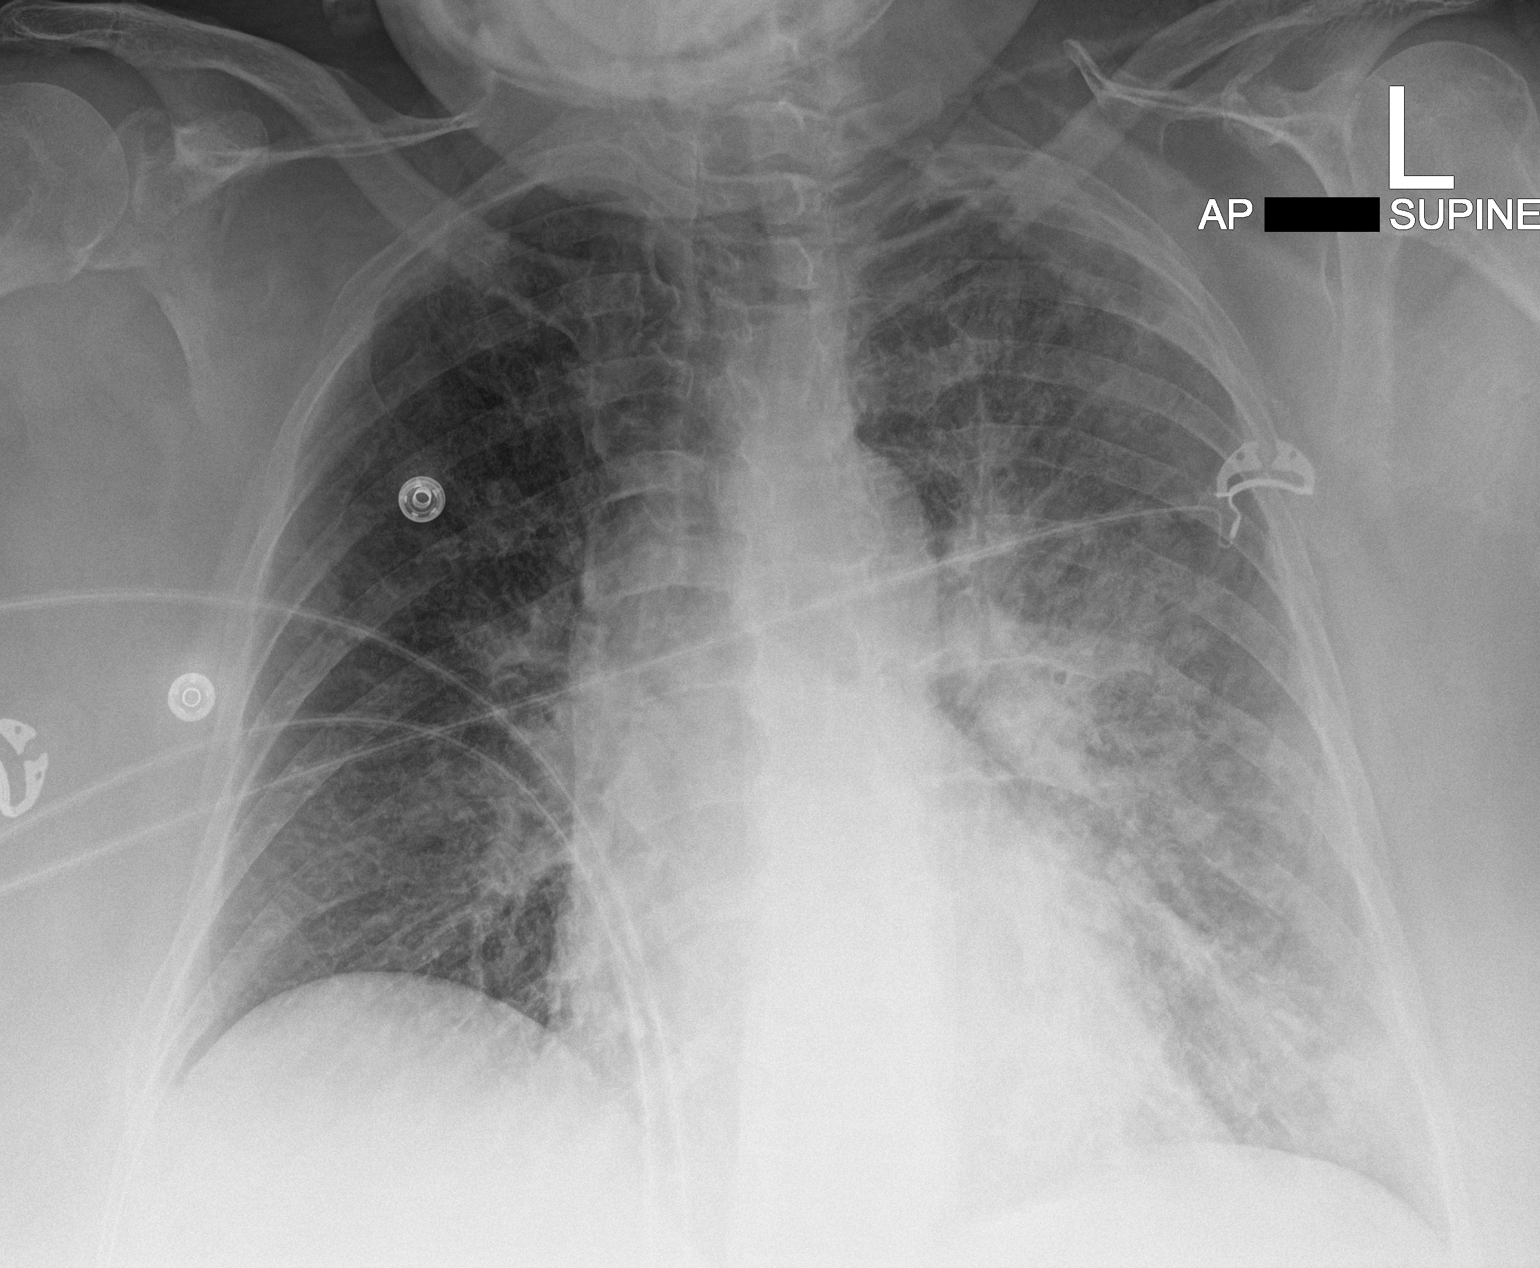

[1 of 1 positions shown; findings below may reference images not displayed]

FINDINGS: Hazy opacities throughout the left lung. Slight parahilar opacity in
the right lung. Cardiomediastinal contours are normal. No pleural
effusion or pneumothorax.
IMPRESSION: Left-greater-than-right pulmonary opacities favored to indicate
pulmonary edema.

## 2022-03-06 NOTE — Progress Notes (Signed)
Remote ICD transmission.   

## 2022-04-16 ENCOUNTER — Telehealth: Payer: Self-pay | Admitting: *Deleted

## 2022-04-16 NOTE — Telephone Encounter (Signed)
Spoke with pt's daughter, DPR on file.  We received a refill for Digoxin, which we haven't filled since 08/16/21 #45.  Called [facility], verified pt hasn' been getting it from anyone else. Pt's daughter will have pt call so we can verify if she's been out and then will need to schedule pt a follow-up appointment.

## 2022-04-17 ENCOUNTER — Other Ambulatory Visit (HOSPITAL_COMMUNITY): Payer: Self-pay

## 2022-04-17 ENCOUNTER — Telehealth: Payer: Self-pay | Admitting: Interventional Cardiology

## 2022-04-17 MED ORDER — DIGOXIN 125 MCG PO TABS: 0.0625 mg | ORAL_TABLET | ORAL | 1 refills | Status: DC

## 2022-04-17 MED ORDER — APIXABAN 5 MG PO TABS: 5.0000 mg | ORAL_TABLET | Freq: Every day | ORAL | 0 refills | Status: DC

## 2022-04-17 NOTE — Telephone Encounter (Signed)
*  STAT* If patient is at the pharmacy, call can be transferred to refill team.   1. Which medications need to be refilled? (please list name of each medication and dose if known) digoxin (LANOXIN) 0.125 MG tablet apixaban (ELIQUIS) 5 MG TABS tablet  2. Which pharmacy/location (including street and city if local pharmacy) is medication to be sent to?  [id number] - [place], [place]    3. Do they need a 30 day or 90 day supply? 90  Pt's been without this med for 5 days.

## 2022-04-17 NOTE — Telephone Encounter (Signed)
Pt's medication was sent to pt's pharmacy as requested. Confirmation received.

## 2022-04-17 NOTE — Telephone Encounter (Signed)
[employer] patient

## 2022-04-26 ENCOUNTER — Encounter (HOSPITAL_COMMUNITY): Payer: Self-pay | Admitting: *Deleted

## 2022-04-27 ENCOUNTER — Encounter: Payer: Self-pay | Admitting: Internal Medicine

## 2022-04-27 ENCOUNTER — Ambulatory Visit: Payer: No Typology Code available for payment source | Attending: Internal Medicine | Admitting: Internal Medicine

## 2022-04-27 VITALS — BP 120/54 | HR 56 | Ht 63.0 in | Wt 189.4 lb

## 2022-04-27 DIAGNOSIS — I5022 Chronic systolic (congestive) heart failure: Secondary | ICD-10-CM

## 2022-04-27 DIAGNOSIS — I255 Ischemic cardiomyopathy: Secondary | ICD-10-CM | POA: Diagnosis not present

## 2022-04-27 DIAGNOSIS — Z95810 Presence of automatic (implantable) cardiac defibrillator: Secondary | ICD-10-CM | POA: Diagnosis not present

## 2022-04-27 MED ORDER — FUROSEMIDE 20 MG PO TABS: 20.0000 mg | ORAL_TABLET | Freq: Every day | ORAL | 3 refills | Status: DC

## 2022-04-27 NOTE — Progress Notes (Addendum)
Cardiology Office Note  Date: 04/27/2022   ID: Terry Hanson, DOB June 08, 1940, MRN [id number]  PCP:  Pcp, No  Cardiologist:  Chalmers Guest, MD Electrophysiologist:  Cristopher Peru, MD   Reason for Office Visit: Follow-up of CAD, ICM   History of Present Illness: Terry Hanson is a 82 y.o. 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  Patient can perform her daily activities at home, cooking, cleaning/mopping/sweeping the house. She reports having SOB sometimes and also was noticing worsening in her breathing recently. Denies any chest pain, dizziness, lightness, syncope, palpitations and leg swelling. Currently on Plavix and Eliquis. No bleeding complications.  Past Medical History:  Diagnosis Date   Arthritis    Asthma    CHF (congestive heart failure) (HCC)    Diabetes mellitus ([place])    Diabetes mellitus without complication ([place])    Enlarged heart    Hypercholesterolemia    Hyperlipidemia    Ischemic cardiomyopathy    a. EF 30-35% by echo in 06/2019   STEMI (ST elevation myocardial infarction) [facility])    a. 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    Past Surgical History:  Procedure Laterality Date   ABDOMINAL HYSTERECTOMY     ATRIAL FIBRILLATION ABLATION N/A 04/05/2021   Procedure: [place];  Surgeon: Constance Haw, MD;  Location: [place] [facility];  Service: Cardiovascular;  Laterality: N/A;   CATARACT EXTRACTION W/PHACO Right 06/27/2020   Procedure: CATARACT EXTRACTION PHACO AND INTRAOCULAR LENS PLACEMENT RIGHT EYE AND PLACEMENT OF CORTICOSTEROID;  Surgeon: Baruch Goldmann, MD;  Location: [facility];  Service: Ophthalmology;  Laterality: Right;  right CDE=25.47   CORONARY/GRAFT ACUTE MI REVASCULARIZATION N/A 06/29/2019    Procedure: Coronary/Graft Acute MI Revascularization;  Surgeon: Belva Crome, MD;  Location: [place] [facility];  Service: Cardiovascular;  Laterality: N/A;   ICD IMPLANT N/A 12/14/2019   Procedure: ICD IMPLANT;  Surgeon: Evans Lance, MD;  Location: [place] [facility];  Service: Cardiovascular;  Laterality: N/A;   LEFT HEART CATH AND CORONARY ANGIOGRAPHY N/A 06/29/2019   Procedure: LEFT HEART CATH AND CORONARY ANGIOGRAPHY;  Surgeon: Belva Crome, MD;  Location: [place] [facility];  Service: Cardiovascular;  Laterality: N/A;   RIGHT HEART CATH N/A 12/30/2020   Procedure: RIGHT HEART CATH;  Surgeon: Larey Dresser, MD;  Location: [place] [facility];  Service: Cardiovascular;  Laterality: N/A;   TEE WITHOUT CARDIOVERSION N/A 04/04/2021   Procedure: TRANSESOPHAGEAL ECHOCARDIOGRAM (TEE);  Surgeon: Jerline Pain, MD;  Location: [facility] ENDOSCOPY;  Service: Cardiovascular;  Laterality: N/A;    Current Outpatient Medications  Medication Sig Dispense Refill   amoxicillin-clavulanate (AUGMENTIN) 875-125 MG tablet Take 1 tablet by mouth every 12 (twelve) hours. 14 tablet 0   apixaban (ELIQUIS) 5 MG TABS tablet Take 1 tablet (5 mg total) by mouth daily. 60 tablet 0   dapagliflozin propanediol (FARXIGA) 10 MG TABS tablet Take 1 tablet (10 mg total) by mouth daily before breakfast. 30 tablet 3   digoxin (LANOXIN) 0.125 MG tablet Take 0.5 tablets (0.0625 mg total) by mouth 3 (three) times a week. Taking Monday, Wednesday, and Friday. 45 tablet 1   doxycycline (VIBRA-TABS) 100 MG tablet Take 1 tablet (100 mg total) by mouth 2 (two) times daily.  20 tablet 0   ENTRESTO 24-26 MG TAKE 1 TABLET BY MOUTH TWICE DAILY 180 tablet 3   levothyroxine (SYNTHROID) 25 MCG tablet TAKE 1 TABLET(25 MCG) BY MOUTH DAILY BEFORE AND BREAKFAST 90 tablet 3   metoprolol succinate (TOPROL-XL) 50 MG 24 hr tablet TAKE 1 TABLET(50 MG) BY MOUTH AT BEDTIME 90 tablet 0   nitroGLYCERIN (NITROSTAT) 0.4 MG SL tablet Place 1 tablet (0.4 mg  total) under the tongue every 5 (five) minutes as needed. 25 tablet 2   nystatin (MYCOSTATIN/NYSTOP) powder Apply 1 Application topically 2 (two) times daily. 15 g 4   potassium chloride (KLOR-CON) 10 MEQ tablet Take 1 tablet (10 mEq total) by mouth 2 (two) times daily. May take 1 extra tablet by mouth for 2-3 pound weight gain overnight, shortness of breath, edema. 225 tablet 1   simvastatin (ZOCOR) 20 MG tablet TAKE 1 TABLET(20 MG) BY MOUTH EVERY EVENING 90 tablet 3   spironolactone (ALDACTONE) 25 MG tablet Take 1 tablet (25 mg total) by mouth at bedtime. 30 tablet 11   furosemide (LASIX) 20 MG tablet Take 1 tablet (20 mg total) by mouth daily. 90 tablet 3   No current facility-administered medications for this visit.   Allergies:  Codeine, Shrimp [shellfish allergy], Amiodarone, Penicillins, and Sulfa antibiotics   Social History: The patient  reports that she has never smoked. She has quit using smokeless tobacco.  Her smokeless tobacco use included chew. She reports that she does not currently use alcohol. She reports that she does not use drugs.   Family History: The patient's family history includes Heart disease in her mother.   ROS:  Please see the history of present illness. Otherwise, complete review of systems is positive for none.  All other systems are reviewed and negative.   Physical Exam: VS:  BP (!) 120/54   Pulse (!) 56   Ht 5' 3"$  (1.6 m)   Wt 189 lb 6.4 oz (85.9 kg)   SpO2 100%   BMI 33.55 kg/m , BMI Body mass index is 33.55 kg/m.  Wt Readings from Last 3 Encounters:  04/27/22 189 lb 6.4 oz (85.9 kg)  10/18/21 187 lb 6.3 oz (85 kg)  10/09/21 188 lb (85.3 kg)    General: Patient appears comfortable at rest. HEENT: Conjunctiva and lids normal, oropharynx clear with moist mucosa. Neck: Supple, no elevated JVP or carotid bruits, no thyromegaly. Lungs: Clear to auscultation, nonlabored breathing at rest. Cardiac: Regular rate and rhythm, no S3 or significant  systolic murmur, no pericardial rub. Abdomen: Soft, nontender, no hepatomegaly, bowel sounds present, no guarding or rebound. Extremities: No pitting edema, distal pulses 2+. Skin: Warm and dry. Musculoskeletal: No kyphosis. Neuropsychiatric: Alert and oriented x3, affect grossly appropriate.  ECG:  An ECG dated 04/27/2022 was personally reviewed today and demonstrated:  NSR, PVC, low voltage QRS in the precordial leads  Recent Labwork: 10/09/2021: BUN 30; Creatinine, Ser 1.66; Hemoglobin 13.2; Platelets 214; Potassium 5.2; Sodium 138     Component Value Date/Time   CHOL 201 (H) 03/24/2021 0754   TRIG 187 (H) 03/24/2021 0754   HDL 36 (L) 03/24/2021 0754   CHOLHDL 5.6 (H) 03/24/2021 0754   CHOLHDL 5.5 02/28/2021 1110   VLDL 24 02/28/2021 1110   LDLCALC 131 (H) 03/24/2021 0754    Other Studies Reviewed Today:   Assessment and Plan: Patient is 82 year old F known to have CAD manifested by anterior STEMI in 06/2019 s/p LAD PCI with ICM LVEF 25% s/p [facility] Jude ICD, atrial  fibrillation s/p PVI in 03/2021, DM 2, CKD stage III, 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  # CAD manifested by anterior STEMI in 06/2019 s/p LAD PCI with ICM LVEF 25%, currently angina free -Not on aspirin due to Eliquis -Continue simvastatin 20 mg nightly -SL NTG 0.4 mg as needed -ER precautions for chest pain  # HLD, currently at goal -Continue simvastatin 20 mg nightly.  No myalgias.  # ICM LVEF 25% s/p [employer] ICD, currently compensated -Patient noticed recent worsening of SOB symptoms but still is able to perform her ADLs.  I did not find dry weight on the chart. Weight today is 189 pounds which increased by 9 pounds from 7/23 visit. I instructed her to take Lasix 20 mg every day and evaluate her symptoms. -Continue metoprolol succinate 50 mg once daily -Continue Entresto 24-26 mg twice daily -Continue Farxiga 10 mg once daily -Continue  spironolactone 25 mg nightly  # s/p [employer] Jude ICD -Follow-up with EP for device checks  # Atrial fibrillation s/p PVI in 03/2021, currently NSR (EKG today showed NSR, PVC, low voltage QRS in precordial leads) -Continue metoprolol succinate 50 mg once daily. She was previously intolerant to amiodarone and had nausea/vomiting. Currently on digoxin per EP recommendations.  She will have to follow-up with EP for continuation of digoxin. -Continue Eliquis 5 mg twice daily (due to CHA2DS2-VASc 2 score more than 3) -Obtain 2D Echocardiogram  I have spent a total of 30 minutes with patient reviewing chart, EKGs, labs and examining patient as well as establishing an assessment and plan that was discussed with the patient.  > 50% of time was spent in direct patient care.     Medication Adjustments/Labs and Tests Ordered: Current medicines are reviewed at length with the patient today.  Concerns regarding medicines are outlined above.   Tests Ordered: Orders Placed This Encounter  Procedures   EKG 12-Lead   ECHOCARDIOGRAM COMPLETE    Medication Changes: Meds ordered this encounter  Medications   furosemide (LASIX) 20 MG tablet    Sig: Take 1 tablet (20 mg total) by mouth daily.    Dispense:  90 tablet    Refill:  3    Disposition:  Follow up  3 months  Signed, Damauri Minion Fidel Levy, MD, 04/27/2022 2:39 PM    [facility] at [facility] [address] [place], [place] [address]

## 2022-04-27 NOTE — Patient Instructions (Signed)
Medication Instructions:  Your physician has recommended you make the following change in your medication:  -Increase Lasix to 20 mg tablets daily    Labwork: None  Testing/Procedures: Your physician has requested that you have an echocardiogram. Echocardiography is a painless test that uses sound waves to create images of your heart. It provides your doctor with information about the size and shape of your heart and how well your heart's chambers and valves are working. This procedure takes approximately one hour. There are no restrictions for this procedure. Please do NOT wear cologne, perfume, aftershave, or lotions (deodorant is allowed). Please arrive 15 minutes prior to your appointment time.\   Follow-Up: Follow up with Dr. Dellia Cloud in 3 months.   Any Other Special Instructions Will Be Listed Below (If Applicable).     If you need a refill on your cardiac medications before your next appointment, please call your pharmacy.

## 2022-05-01 ENCOUNTER — Other Ambulatory Visit: Payer: Self-pay | Admitting: *Deleted

## 2022-05-01 MED ORDER — METOPROLOL SUCCINATE ER 50 MG PO TB24: ORAL_TABLET | ORAL | 3 refills | Status: DC

## 2022-05-01 MED ORDER — FUROSEMIDE 20 MG PO TABS: 20.0000 mg | ORAL_TABLET | Freq: Every day | ORAL | 3 refills | Status: DC

## 2022-05-01 MED ORDER — SIMVASTATIN 20 MG PO TABS: ORAL_TABLET | ORAL | 3 refills | Status: DC

## 2022-05-01 MED ORDER — APIXABAN 5 MG PO TABS: 5.0000 mg | ORAL_TABLET | Freq: Every day | ORAL | 3 refills | Status: DC

## 2022-05-01 MED ORDER — SPIRONOLACTONE 25 MG PO TABS: 25.0000 mg | ORAL_TABLET | Freq: Every day | ORAL | 3 refills | Status: DC

## 2022-05-10 ENCOUNTER — Other Ambulatory Visit (HOSPITAL_COMMUNITY): Payer: Self-pay | Admitting: Cardiology

## 2022-05-14 ENCOUNTER — Ambulatory Visit: Payer: No Typology Code available for payment source

## 2022-05-14 DIAGNOSIS — I255 Ischemic cardiomyopathy: Secondary | ICD-10-CM

## 2022-05-14 DIAGNOSIS — I5022 Chronic systolic (congestive) heart failure: Secondary | ICD-10-CM | POA: Diagnosis not present

## 2022-05-14 LAB — CUP PACEART REMOTE DEVICE CHECK
Battery Remaining Longevity: 95 mo
Battery Remaining Percentage: 78 %
Battery Voltage: 2.99 V
Brady Statistic RV Percent Paced: 1 %
Date Time Interrogation Session: 20240225135722
HighPow Impedance: 72 Ohm
Implantable Lead Connection Status: 753985
Implantable Lead Implant Date: 20210927
Implantable Lead Location: 753860
Implantable Pulse Generator Implant Date: 20210927
Lead Channel Impedance Value: 360 Ohm
Lead Channel Pacing Threshold Amplitude: 0.75 V
Lead Channel Pacing Threshold Pulse Width: 0.5 ms
Lead Channel Sensing Intrinsic Amplitude: 11.6 mV
Lead Channel Setting Pacing Amplitude: 2.5 V
Lead Channel Setting Pacing Pulse Width: 0.5 ms
Lead Channel Setting Sensing Sensitivity: 0.5 mV
Pulse Gen Serial Number: 810006603
Zone Setting Status: 755011

## 2022-05-24 ENCOUNTER — Ambulatory Visit (HOSPITAL_COMMUNITY)
Admission: RE | Admit: 2022-05-24 | Discharge: 2022-05-24 | Disposition: A | Discharge status: Home / Self Care | Payer: No Typology Code available for payment source | Source: Ambulatory Visit | Attending: Internal Medicine | Admitting: Internal Medicine

## 2022-05-24 DIAGNOSIS — I255 Ischemic cardiomyopathy: Secondary | ICD-10-CM | POA: Diagnosis not present

## 2022-05-24 LAB — ECHOCARDIOGRAM COMPLETE
AR max vel: 1.5 cm2
AV Area VTI: 1.36 cm2
AV Area mean vel: 1.33 cm2
AV Mean grad: 7.9 mmHg
AV Peak grad: 12.9 mmHg
Ao pk vel: 1.8 m/s
Area-P 1/2: 4.96 cm2
S' Lateral: 4 cm

## 2022-05-24 MED ORDER — PERFLUTREN LIPID MICROSPHERE
1.0000 mL | INTRAVENOUS | Status: AC | PRN
  Administered 2022-05-24: 8 mL via INTRAVENOUS

## 2022-05-24 NOTE — Progress Notes (Signed)
  Echocardiogram 2D Echocardiogram has been performed.  Terry Hanson 05/24/2022, 2:31 PM

## 2022-05-30 ENCOUNTER — Telehealth: Payer: Self-pay

## 2022-05-30 ENCOUNTER — Other Ambulatory Visit: Payer: Self-pay

## 2022-05-30 MED ORDER — ENTRESTO 24-26 MG PO TABS: 1.0000 | ORAL_TABLET | Freq: Two times a day (BID) | ORAL | 3 refills | Status: DC

## 2022-05-30 MED ORDER — POTASSIUM CHLORIDE ER 10 MEQ PO TBCR: 10.0000 meq | EXTENDED_RELEASE_TABLET | Freq: Two times a day (BID) | ORAL | 1 refills | Status: DC

## 2022-05-30 MED ORDER — DAPAGLIFLOZIN PROPANEDIOL 10 MG PO TABS: 10.0000 mg | ORAL_TABLET | Freq: Every day | ORAL | 3 refills | Status: DC

## 2022-05-30 NOTE — Telephone Encounter (Signed)
This is a [place] pt, Dr. Dellia Cloud pt.

## 2022-05-30 NOTE — Telephone Encounter (Signed)
-----   Message from Chalmers Guest, MD sent at 05/28/2022  2:48 PM EDT ----- Heart pumping function is 20 to 25% (similar to 2023 echocardiogram) and has mild to moderate aortic valve stenosis. Repeat echocardiogram in 1 year to monitor aortic valve stenosis.

## 2022-05-30 NOTE — Telephone Encounter (Signed)
Patient notified and verbalized understanding. Patient had no questions or concerns at this time.  

## 2022-06-21 ENCOUNTER — Telehealth: Payer: Self-pay | Admitting: Internal Medicine

## 2022-06-21 DIAGNOSIS — I4819 Other persistent atrial fibrillation: Secondary | ICD-10-CM

## 2022-06-21 MED ORDER — APIXABAN 5 MG PO TABS: 5.0000 mg | ORAL_TABLET | Freq: Two times a day (BID) | ORAL | 1 refills | Status: DC

## 2022-06-21 NOTE — Telephone Encounter (Signed)
Pt c/o medication issue:  1. Name of Medication:   apixaban (ELIQUIS) 5 MG TABS tablet   2. How are you currently taking this medication (dosage and times per day)?   3. Are you having a reaction (difficulty breathing--STAT)?   4. What is your medication issue?   Caller stated they will need clarification on the patient's dosage for this medication.

## 2022-06-21 NOTE — Telephone Encounter (Signed)
Prescription refill request for Eliquis received. Indication: Afib  Last office visit: 04/27/22 ([place])  Scr: 1.66 (10/09/21)  Age: 82 Weight: 85.9kg  Prescription adjusted to appropriate frequency. New prescription sent to pharmacy.

## 2022-06-21 NOTE — Telephone Encounter (Signed)
Attempted to call patient to verify patient taking Eliquis 5 mg BID.

## 2022-06-21 NOTE — Telephone Encounter (Signed)
Spoke to pharmacy and verbalized that medication instructions should say take one tablet by mouth twice daily.    Will route to anticoagulation team to correct prescription.

## 2022-06-21 NOTE — Telephone Encounter (Signed)
Spoke to patient who stated that she had only been taking medication once daily. Verbalized to patient that she should take Eliquis twice daily. Patient voiced understanding.

## 2022-06-21 NOTE — Telephone Encounter (Signed)
Patients Eliquis says qd, can you clarify why it it not BID?

## 2022-06-22 NOTE — Progress Notes (Signed)
Remote ICD transmission.   

## 2022-06-29 MED ORDER — APIXABAN 2.5 MG PO TABS: 2.5000 mg | ORAL_TABLET | Freq: Two times a day (BID) | ORAL | 1 refills | Status: DC

## 2022-06-29 NOTE — Telephone Encounter (Signed)
Upon further review of safety zone, patient should also be on 2.5mg  BID, not 5mg  BID.

## 2022-06-29 NOTE — Telephone Encounter (Signed)
I will forward to Misty Stanley Reid,RN to make medication dose correction per C.Pavero,RPH

## 2022-06-29 NOTE — Telephone Encounter (Signed)
Per Laural Golden, PharmD, dose should be decreased to Eliquis 2.5mg  BID.   Called pt and made her aware of dose change, no answer. Left detailed message on voicemail and number to call back [phone])    Eliquis 2.5mg  BID refill sent to pharmacy.

## 2022-06-29 NOTE — Addendum Note (Signed)
Addended by: Memory Dance on: 06/29/2022 11:58 AM   Modules accepted: Orders

## 2022-06-29 NOTE — Addendum Note (Signed)
Addended by: Memory Dance on: 06/29/2022 11:57 AM   Modules accepted: Orders

## 2022-07-27 ENCOUNTER — Other Ambulatory Visit: Payer: Self-pay

## 2022-07-27 ENCOUNTER — Telehealth: Payer: Self-pay | Admitting: Internal Medicine

## 2022-07-27 MED ORDER — POTASSIUM CHLORIDE ER 10 MEQ PO TBCR: 10.0000 meq | EXTENDED_RELEASE_TABLET | Freq: Two times a day (BID) | ORAL | 1 refills | Status: DC

## 2022-07-27 NOTE — Telephone Encounter (Signed)
Pharmacist called and said that another doctor put him on clopidigrel while he is also on Eliquis and was making sure that it was ok to be taking medication at the same time.  Also wants to talk about clarification on the medical on the potassium chloride (KLOR-CON) 10 MEQ tablet on the instructions

## 2022-07-27 NOTE — Telephone Encounter (Signed)
Spoke to pharmacist who verbalized understanding

## 2022-08-02 ENCOUNTER — Ambulatory Visit: Payer: No Typology Code available for payment source | Attending: Internal Medicine | Admitting: Internal Medicine

## 2022-08-02 NOTE — Progress Notes (Signed)
Erroneous encounter - please disregard.

## 2022-08-03 ENCOUNTER — Ambulatory Visit: Payer: No Typology Code available for payment source | Admitting: Internal Medicine

## 2022-08-03 ENCOUNTER — Telehealth: Payer: Self-pay

## 2022-08-03 NOTE — Telephone Encounter (Signed)
Following alert received from [employer] received for VT with therapy from 06/19/2021 @ 18:18. There are no EGM's to review for events, ATP and HV therapy noted.  Attempted to contact patient on all numbers, no answer. LMTCB.  Will need another transmission to review EGM's.

## 2022-08-08 NOTE — Telephone Encounter (Signed)
LVM on home phone to call device clinic back. Also spoke with patients daughter she stated she would contact patient and have them call device clinic, direct number to device clinic given.

## 2022-08-09 NOTE — Telephone Encounter (Signed)
Manual transmission received. No EGM available. Attempted to contact patient and family to have patient come into DC to obtain EGM's with programmer. No answer, LMTCB.

## 2022-08-14 ENCOUNTER — Ambulatory Visit: Payer: No Typology Code available for payment source

## 2022-08-29 ENCOUNTER — Other Ambulatory Visit: Payer: Self-pay | Admitting: Internal Medicine

## 2022-09-03 ENCOUNTER — Ambulatory Visit (INDEPENDENT_AMBULATORY_CARE_PROVIDER_SITE_OTHER): Payer: No Typology Code available for payment source

## 2022-09-03 DIAGNOSIS — I5022 Chronic systolic (congestive) heart failure: Secondary | ICD-10-CM

## 2022-09-04 LAB — CUP PACEART REMOTE DEVICE CHECK
Date Time Interrogation Session: 20240617140639
Implantable Lead Connection Status: 753985
Implantable Lead Implant Date: 20210927
Implantable Lead Location: 753860
Implantable Pulse Generator Implant Date: 20210927
Pulse Gen Serial Number: 810006603

## 2022-09-18 ENCOUNTER — Telehealth: Payer: Self-pay | Admitting: Internal Medicine

## 2022-09-18 ENCOUNTER — Other Ambulatory Visit: Payer: Self-pay

## 2022-09-18 NOTE — Telephone Encounter (Signed)
Former Dr.Smith patient w/o a pcp who wants refills of levothyroxine. I don't see any current lab work (TSH) since 12/2020  Do you authorize refill of medication?

## 2022-09-18 NOTE — Telephone Encounter (Signed)
This is a [place] pt, seen by Dr. Jenene Slicker. Please address

## 2022-09-18 NOTE — Telephone Encounter (Signed)
[place] at [facility] notified that thyroid medication would need to be refilled by her pcp.

## 2022-09-18 NOTE — Telephone Encounter (Signed)
*  STAT* If patient is at the pharmacy, call can be transferred to refill team.   1. Which medications need to be refilled? (please list name of each medication and dose if known)   levothyroxine (SYNTHROID) 25 MCG tablet   2. Which pharmacy/location (including street and city if local pharmacy) is medication to be sent to?  [facility] [place] - [place], [place] [address]   3. Do they need a 30 day or 90 day supply?   90 day   Caller stated patient is almost out of this medication.

## 2022-09-21 NOTE — Progress Notes (Signed)
Remote ICD transmission.   

## 2022-10-02 ENCOUNTER — Telehealth: Payer: Self-pay

## 2022-10-02 NOTE — Telephone Encounter (Signed)
Device alert for sustained VT with successful HV therapy Event occurred 7/14 @ 09:11, duration 56sec, HR 214 falling into the VT-2 zone EGM shows likely AF with RVR, similar morphology to presenting, ATP delivered x4 with no change, HV therapy delivered 30J slowing to irregular VS.  There was also an event 6/29 @ 09:17 showing the same with ATP delivered x2 There are 32 NSVT, and 8 SVT episodes.  Recent increase in both day and night HR's per trends, Eliquis per EPIC Route to triage LA, CVRS  Spoke with patient. Pt was getting ready for church when she "got popped and it felt like a bolt of lightening." Increased fatigue and shortness of breath recently and today has increased swelling in BLE. Reviewed medication regimen adherence and pt reports being compliant w/ respect to Eliquis 2.5mg , Metop succ. 50mg , Lasix 20mg , Spiro 25mg . Overdue for EP w/ Ladona Ridgel MD. Advised pt about ED precautions.

## 2022-10-02 NOTE — Telephone Encounter (Signed)
Appointment made in clinic with GT 7/23

## 2022-10-09 ENCOUNTER — Encounter: Payer: Self-pay | Admitting: Internal Medicine

## 2022-10-09 ENCOUNTER — Ambulatory Visit: Payer: No Typology Code available for payment source | Attending: Internal Medicine | Admitting: Internal Medicine

## 2022-10-09 VITALS — BP 104/60 | HR 73 | Ht 62.0 in | Wt 194.0 lb

## 2022-10-09 DIAGNOSIS — I255 Ischemic cardiomyopathy: Secondary | ICD-10-CM | POA: Diagnosis not present

## 2022-10-09 DIAGNOSIS — I4819 Other persistent atrial fibrillation: Secondary | ICD-10-CM | POA: Diagnosis not present

## 2022-10-09 DIAGNOSIS — Z95810 Presence of automatic (implantable) cardiac defibrillator: Secondary | ICD-10-CM

## 2022-10-09 LAB — CUP PACEART INCLINIC DEVICE CHECK
Battery Remaining Longevity: 94 mo
Brady Statistic RV Percent Paced: 0.36 %
Date Time Interrogation Session: 20240723111517
HighPow Impedance: 84 Ohm
Implantable Lead Connection Status: 753985
Implantable Lead Implant Date: 20210927
Implantable Lead Location: 753860
Implantable Pulse Generator Implant Date: 20210927
Lead Channel Impedance Value: 437.5 Ohm
Lead Channel Pacing Threshold Amplitude: 1 V
Lead Channel Pacing Threshold Amplitude: 1 V
Lead Channel Pacing Threshold Pulse Width: 0.5 ms
Lead Channel Pacing Threshold Pulse Width: 0.5 ms
Lead Channel Sensing Intrinsic Amplitude: 11.6 mV
Lead Channel Setting Pacing Amplitude: 2.5 V
Lead Channel Setting Pacing Pulse Width: 0.5 ms
Lead Channel Setting Sensing Sensitivity: 0.5 mV
Pulse Gen Serial Number: 810006603
Zone Setting Status: 755011

## 2022-10-09 MED ORDER — METOPROLOL SUCCINATE ER 25 MG PO TB24: 25.0000 mg | ORAL_TABLET | Freq: Every day | ORAL | 3 refills | Status: DC

## 2022-10-09 NOTE — Patient Instructions (Addendum)
Medication Instructions:  Your physician has recommended you make the following change in your medication:    ADD metoprolol succinate 25 mg-  Take one tablet by mouth in the MORNING.    CONTINUE taking metoprolol succinate 50 mg by mouth at BEDTIME.   *If you need a refill on your cardiac medications before your next appointment, please call your pharmacy*  Lab Work: None ordered.  If you have labs (blood work) drawn today and your tests are completely normal, you will receive your results only by: MyChart Message (if you have MyChart) OR A paper copy in the mail If you have any lab test that is abnormal or we need to change your treatment, we will call you to review the results.  Testing/Procedures: None ordered.  Follow-Up: At [facility], you and your health needs are our priority.  As part of our continuing mission to provide you with exceptional heart care, we have created designated Provider Care Teams.  These Care Teams include your primary Cardiologist (physician) and Advanced Practice Providers (APPs -  Physician Assistants and Nurse Practitioners) who all work together to provide you with the care you need, when you need it.  We recommend signing up for the patient portal called "MyChart".  Sign up information is provided on this After Visit Summary.  MyChart is used to connect with patients for Virtual Visits (Telemedicine).  Patients are able to view lab/test results, encounter notes, upcoming appointments, etc.  Non-urgent messages can be sent to your provider as well.   To learn more about what you can do with MyChart, go to [email or web address].    Your next appointment:   1 year(s)  The format for your next appointment:   In Person  Provider:   Lewayne Bunting, MD{or one of the following Advanced Practice Providers on your designated Care Team:   Francis Dowse, [place] Casimiro Needle "Mardelle Matte" We[place], [place] Earnest Rosier, NP  Remote monitoring is used to monitor your  Pacemaker/ ICD from home. This monitoring reduces the number of office visits required to check your device to one time per year. It allows [place] to keep an eye on the functioning of your device to ensure it is working properly. You are scheduled for a device check from home on 12/03/2022. You may send your transmission at any time that day. If you have a wireless device, the transmission will be sent automatically. After your physician reviews your transmission, you will receive a postcard with your next transmission date.  Important Information About [employer]

## 2022-10-09 NOTE — Progress Notes (Signed)
HPI Terry Hanson returns today for followup. 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 She also has a h/o PVI for atrial fib. She has class 2 symptoms. No chest pain or sob. She received an ICD shock for atrial fib with a RVR. She denies non-compliance with her beta blocker.  Allergies  Allergen Reactions   Codeine Hives   Shrimp [Shellfish Allergy] Other (See Comments)    Broke out   Amiodarone Nausea And Vomiting   Penicillins Itching and Rash    Did it involve swelling of the face/tongue/throat, SOB, or low BP? Yes Did it involve sudden or severe rash/hives, skin peeling, or any reaction on the inside of your mouth or nose? Yes Did you need to seek medical attention at a hospital or doctor's office? Yes When did it last happen? Over 10 years       If all above answers are "NO", may proceed with cephalosporin use.    Sulfa Antibiotics Itching     Current Outpatient Medications  Medication Sig Dispense Refill   amoxicillin-clavulanate (AUGMENTIN) 875-125 MG tablet Take 1 tablet by mouth every 12 (twelve) hours. 14 tablet 0   apixaban (ELIQUIS) 2.5 MG TABS tablet Take 1 tablet (2.5 mg total) by mouth 2 (two) times daily. 180 tablet 1   digoxin (LANOXIN) 0.125 MG tablet Take 0.5 tablets (0.0625 mg total) by mouth 3 (three) times a week. Taking Monday, Wednesday, and Friday. 45 tablet 1   doxycycline (VIBRA-TABS) 100 MG tablet Take 1 tablet (100 mg total) by mouth 2 (two) times daily. 20 tablet 0   FARXIGA 10 MG TABS tablet TAKE ONE TABLET (10 MG TOTAL) BY MOUTH DAILY AT 9 AM BEFORE BREAKFAST 30 tablet 11   furosemide (LASIX) 20 MG tablet Take 1 tablet (20 mg total) by mouth daily. 90 tablet 3   levothyroxine (SYNTHROID) 25 MCG tablet TAKE 1 TABLET(25 MCG) BY MOUTH DAILY BEFORE AND BREAKFAST 90 tablet 3   metoprolol succinate (TOPROL XL) 25 MG 24 hr tablet Take 1 tablet (25 mg total) by mouth daily. IN THE AM. 90 tablet 3    metoprolol succinate (TOPROL-XL) 50 MG 24 hr tablet TAKE 1 TABLET(50 MG) BY MOUTH AT BEDTIME 90 tablet 3   nitroGLYCERIN (NITROSTAT) 0.4 MG SL tablet Place 1 tablet (0.4 mg total) under the tongue every 5 (five) minutes as needed. 25 tablet 2   nystatin (MYCOSTATIN/NYSTOP) powder Apply 1 Application topically 2 (two) times daily. 15 g 4   potassium chloride (KLOR-CON) 10 MEQ tablet Take 1 tablet (10 mEq total) by mouth 2 (two) times daily. 225 tablet 1   sacubitril-valsartan (ENTRESTO) 24-26 MG Take 1 tablet by mouth 2 (two) times daily. 180 tablet 3   simvastatin (ZOCOR) 20 MG tablet TAKE 1 TABLET(20 MG) BY MOUTH EVERY EVENING 90 tablet 3   spironolactone (ALDACTONE) 25 MG tablet Take 1 tablet (25 mg total) by mouth at bedtime. 90 tablet 3   No current facility-administered medications for this visit.     Past Medical History:  Diagnosis Date   Arthritis    Asthma    CHF (congestive heart failure) (HCC)    Diabetes mellitus (HCC)    Diabetes mellitus without complication (HCC)    Enlarged heart    Hypercholesterolemia    Hyperlipidemia    Ischemic cardiomyopathy    a. EF 30-35% by echo in 06/2019   STEMI (  ST elevation myocardial infarction) (HCC)    a. 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    ROS:   All systems reviewed and negative except as noted in the HPI.   Past Surgical History:  Procedure Laterality Date   ABDOMINAL HYSTERECTOMY     ATRIAL FIBRILLATION ABLATION N/A 04/05/2021   Procedure: ATRIAL FIBRILLATION ABLATION;  Surgeon: Regan Lemming, MD;  Location: [facility] IN[facility]ASIVE [facility];  Service: Cardiovascular;  Laterality: N/A;   CATARACT EXTRACTION W/PHACO Right 06/27/2020   Procedure: CATARACT EXTRACTION PHACO AND INTRAOCULAR LENS PLACEMENT RIGHT EYE AND PLACEMENT OF CORTICOSTEROID;  Surgeon: Fabio Pierce, MD;  Location: [facility];  Service: Ophthalmology;  Laterality: Right;  right CDE=25.47   CORONARY/GRAFT ACUTE MI REVASCULARIZATION N/A 06/29/2019    Procedure: Coronary/Graft Acute MI Revascularization;  Surgeon: Lyn Records, MD;  Location: [facility] INVASIVE CV LAB;  Service: Cardiovascular;  Laterality: N/A;   ICD IMPLANT N/A 12/14/2019   Procedure: ICD IMPLANT;  Surgeon: Marinus Maw, MD;  Location: [facility] INVASIVE CV LAB;  Service: Cardiovascular;  Laterality: N/A;   LEFT HEART CATH AND CORONARY ANGIOGRAPHY N/A 06/29/2019   Procedure: LEFT HEART CATH AND CORONARY ANGIOGRAPHY;  Surgeon: Lyn Records, MD;  Location: [facility] CV [facility];  Service: Cardiovascular;  Laterality: N/A;   RIGHT HEART CATH N/A 12/30/2020   Procedure: RIGHT HEART CATH;  Surgeon: Laurey Morale, MD;  Location: [facility] INVASIVE CV LAB;  Service: Cardiovascular;  Laterality: N/A;   TEE WITHOUT CARDIOVERSION N/A 04/04/2021   Procedure: TRANSESOPHAGEAL ECHOCARDIOGRAM (TEE);  Surgeon: Jake Bathe, MD;  Location: [facility] ENDOSCOPY;  Service: Cardiovascular;  Laterality: N/A;     Family History  Problem Relation Age of Onset   Heart disease Mother      Social History   Socioeconomic History   Marital status: Married    Spouse name: Not on file   Number of children: Not on file   Years of education: Not on file   Highest education level: Not on file  Occupational History   Not on file  Tobacco Use   Smoking status: Never   Smokeless tobacco: Former    Types: Chew   Tobacco comments:    Former chew tobacco 05/03/21  Vaping Use   Vaping status: Never Used  Substance and Sexual Activity   Alcohol use: Not Currently   Drug use: Never   Sexual activity: Not on file  Other Topics Concern   Not on file  Social History Narrative   ** Merged History Encounter **       Social Determinants of Health   Financial Resource Strain: Not on file  Food Insecurity: Not on file  Transportation Needs: Not on file  Physical Activity: Not on file  Stress: Not on file  Social Connections: Not on file  Intimate Partner Violence: Not on file     BP 104/60   Pulse 73   Ht  5\' 2"  (1.575 m)   Wt 194 lb (88 kg)   SpO2 97%   BMI 35.48 kg/m   Physical Exam:  Obese appearing NAD HEENT: Unremarkable Neck:  No JVD, no thyromegally Lymphatics:  No adenopathy Back:  No CVA tenderness Lungs:  Clear with no wheezes HEART:  Regular rate rhythm, no murmurs, no rubs, no clicks Abd:  soft, positive bowel sounds, no organomegally, no rebound, no guarding Ext:  2 plus pulses, no edema, no cyanosis, no clubbing Skin:  No rashes no nodules Neuro:  CN II through XII  intact, motor grossly intact  EKG  - nsr with old anterior MI  DEVICE  Normal device function.  See PaceArt for details.   Assess/Plan: Atrial fib with a RVR - she has had her dose of toprol uptitrated. She is not a great candidate for afib ablation due to age and obesity. I could consider dofetilide if she has more rapid atrial fib. ICD shock - I asked her to uptitrate her toprol and we reprogrammed her ICD to minimize ICD therapies. Coags - she is tolerating her systemic anti-coag.  Obesity - she is encouraged to work on weight loss.  Lewayne Bunting, MD

## 2022-11-02 ENCOUNTER — Telehealth: Payer: Self-pay

## 2022-11-02 NOTE — Telephone Encounter (Addendum)
Unscheduled manual transmission received.  Normal device function. 11 NSVT, 3 SVT, EGM's with irregular R-R, likely AF with RVR.  All events 8/15, longest duration 14sec. Eliquis, Metoprolol, Digoxin per EPIC.  Increase in daily HR trends - route to triage Follow up as scheduled [facility], [facility]  Pt sx during epsidodes. Manual transmission sent today to assess presenting. Pt reports between 1-3PM 8/15 sx of dizziness, palpitations, racing heart, ShOB, overall feeling of unwell, and fearful during episodes.  This correlates with the alerted episodes. She was running errands. Feels better today but still "not right." Verified patient is taking medications appropriately; specifically, metoprolol xL 25mg  in AM and 50mg  q night hs. The longest episode lasting . 14 sec. Likely AF RVR. Forced transmission today. Presenting is a irregular w/ ectopy 90's. Was seen in clinic 7/23 and tachy therapy zones adjusted after inappropriate shock in the setting of af rvr. Pt given ED precautions with any new acute changes.

## 2022-11-05 NOTE — Telephone Encounter (Signed)
Have her increase metoprolol to 50 bid

## 2022-11-06 NOTE — Telephone Encounter (Signed)
Spoke w/ patient to advise of medication change.

## 2022-11-12 ENCOUNTER — Ambulatory Visit: Payer: No Typology Code available for payment source

## 2022-12-01 ENCOUNTER — Encounter (HOSPITAL_COMMUNITY): Payer: Self-pay | Admitting: *Deleted

## 2022-12-01 ENCOUNTER — Emergency Department (HOSPITAL_COMMUNITY): Payer: No Typology Code available for payment source

## 2022-12-01 ENCOUNTER — Other Ambulatory Visit: Payer: Self-pay

## 2022-12-01 ENCOUNTER — Inpatient Hospital Stay (HOSPITAL_COMMUNITY)
Admission: EM | Admit: 2022-12-01 | Discharge: 2022-12-09 | DRG: 309 | Disposition: A | Discharge status: Home Health | Payer: No Typology Code available for payment source | Attending: Internal Medicine | Admitting: Internal Medicine

## 2022-12-01 DIAGNOSIS — Z880 Allergy status to penicillin: Secondary | ICD-10-CM

## 2022-12-01 DIAGNOSIS — Z4502 Encounter for adjustment and management of automatic implantable cardiac defibrillator: Secondary | ICD-10-CM

## 2022-12-01 DIAGNOSIS — I482 Chronic atrial fibrillation, unspecified: Secondary | ICD-10-CM

## 2022-12-01 DIAGNOSIS — Z888 Allergy status to other drugs, medicaments and biological substances status: Secondary | ICD-10-CM

## 2022-12-01 DIAGNOSIS — E876 Hypokalemia: Secondary | ICD-10-CM | POA: Diagnosis not present

## 2022-12-01 DIAGNOSIS — R042 Hemoptysis: Secondary | ICD-10-CM | POA: Diagnosis present

## 2022-12-01 DIAGNOSIS — S0990XA Unspecified injury of head, initial encounter: Secondary | ICD-10-CM | POA: Diagnosis not present

## 2022-12-01 DIAGNOSIS — Z882 Allergy status to sulfonamides status: Secondary | ICD-10-CM

## 2022-12-01 DIAGNOSIS — Z6837 Body mass index (BMI) 37.0-37.9, adult: Secondary | ICD-10-CM

## 2022-12-01 DIAGNOSIS — D649 Anemia, unspecified: Secondary | ICD-10-CM | POA: Diagnosis not present

## 2022-12-01 DIAGNOSIS — R4182 Altered mental status, unspecified: Secondary | ICD-10-CM | POA: Diagnosis not present

## 2022-12-01 DIAGNOSIS — K5730 Diverticulosis of large intestine without perforation or abscess without bleeding: Secondary | ICD-10-CM | POA: Diagnosis not present

## 2022-12-01 DIAGNOSIS — I130 Hypertensive heart and chronic kidney disease with heart failure and stage 1 through stage 4 chronic kidney disease, or unspecified chronic kidney disease: Secondary | ICD-10-CM | POA: Diagnosis present

## 2022-12-01 DIAGNOSIS — R053 Chronic cough: Secondary | ICD-10-CM | POA: Diagnosis present

## 2022-12-01 DIAGNOSIS — I5022 Chronic systolic (congestive) heart failure: Secondary | ICD-10-CM | POA: Diagnosis present

## 2022-12-01 DIAGNOSIS — I4729 Other ventricular tachycardia: Secondary | ICD-10-CM | POA: Diagnosis present

## 2022-12-01 DIAGNOSIS — E875 Hyperkalemia: Secondary | ICD-10-CM

## 2022-12-01 DIAGNOSIS — Z7901 Long term (current) use of anticoagulants: Secondary | ICD-10-CM

## 2022-12-01 DIAGNOSIS — N3289 Other specified disorders of bladder: Secondary | ICD-10-CM | POA: Diagnosis not present

## 2022-12-01 DIAGNOSIS — N184 Chronic kidney disease, stage 4 (severe): Secondary | ICD-10-CM | POA: Diagnosis present

## 2022-12-01 DIAGNOSIS — I350 Nonrheumatic aortic (valve) stenosis: Secondary | ICD-10-CM | POA: Diagnosis present

## 2022-12-01 DIAGNOSIS — Z8249 Family history of ischemic heart disease and other diseases of the circulatory system: Secondary | ICD-10-CM

## 2022-12-01 DIAGNOSIS — Y92009 Unspecified place in unspecified non-institutional (private) residence as the place of occurrence of the external cause: Secondary | ICD-10-CM

## 2022-12-01 DIAGNOSIS — J45909 Unspecified asthma, uncomplicated: Secondary | ICD-10-CM | POA: Diagnosis present

## 2022-12-01 DIAGNOSIS — S098XXA Other specified injuries of head, initial encounter: Secondary | ICD-10-CM

## 2022-12-01 DIAGNOSIS — I428 Other cardiomyopathies: Secondary | ICD-10-CM | POA: Diagnosis present

## 2022-12-01 DIAGNOSIS — W19XXXA Unspecified fall, initial encounter: Secondary | ICD-10-CM | POA: Diagnosis not present

## 2022-12-01 DIAGNOSIS — I2510 Atherosclerotic heart disease of native coronary artery without angina pectoris: Secondary | ICD-10-CM | POA: Diagnosis not present

## 2022-12-01 DIAGNOSIS — Z95810 Presence of automatic (implantable) cardiac defibrillator: Secondary | ICD-10-CM | POA: Diagnosis not present

## 2022-12-01 DIAGNOSIS — I4901 Ventricular fibrillation: Secondary | ICD-10-CM | POA: Diagnosis not present

## 2022-12-01 DIAGNOSIS — I255 Ischemic cardiomyopathy: Secondary | ICD-10-CM | POA: Diagnosis present

## 2022-12-01 DIAGNOSIS — D519 Vitamin B12 deficiency anemia, unspecified: Secondary | ICD-10-CM | POA: Diagnosis present

## 2022-12-01 DIAGNOSIS — I493 Ventricular premature depolarization: Secondary | ICD-10-CM | POA: Diagnosis present

## 2022-12-01 DIAGNOSIS — Z91041 Radiographic dye allergy status: Secondary | ICD-10-CM

## 2022-12-01 DIAGNOSIS — I4819 Other persistent atrial fibrillation: Secondary | ICD-10-CM | POA: Diagnosis present

## 2022-12-01 DIAGNOSIS — N1832 Chronic kidney disease, stage 3b: Secondary | ICD-10-CM | POA: Diagnosis present

## 2022-12-01 DIAGNOSIS — Z90710 Acquired absence of both cervix and uterus: Secondary | ICD-10-CM

## 2022-12-01 DIAGNOSIS — Z7984 Long term (current) use of oral hypoglycemic drugs: Secondary | ICD-10-CM

## 2022-12-01 DIAGNOSIS — Z91013 Allergy to seafood: Secondary | ICD-10-CM

## 2022-12-01 DIAGNOSIS — Z955 Presence of coronary angioplasty implant and graft: Secondary | ICD-10-CM

## 2022-12-01 DIAGNOSIS — D696 Thrombocytopenia, unspecified: Secondary | ICD-10-CM | POA: Diagnosis not present

## 2022-12-01 DIAGNOSIS — Z1152 Encounter for screening for COVID-19: Secondary | ICD-10-CM

## 2022-12-01 DIAGNOSIS — R55 Syncope and collapse: Secondary | ICD-10-CM | POA: Diagnosis not present

## 2022-12-01 DIAGNOSIS — E669 Obesity, unspecified: Secondary | ICD-10-CM | POA: Insufficient documentation

## 2022-12-01 DIAGNOSIS — R231 Pallor: Secondary | ICD-10-CM | POA: Diagnosis not present

## 2022-12-01 DIAGNOSIS — R410 Disorientation, unspecified: Secondary | ICD-10-CM | POA: Diagnosis not present

## 2022-12-01 DIAGNOSIS — I959 Hypotension, unspecified: Secondary | ICD-10-CM | POA: Diagnosis not present

## 2022-12-01 DIAGNOSIS — N179 Acute kidney failure, unspecified: Secondary | ICD-10-CM | POA: Diagnosis not present

## 2022-12-01 DIAGNOSIS — I469 Cardiac arrest, cause unspecified: Secondary | ICD-10-CM | POA: Diagnosis not present

## 2022-12-01 DIAGNOSIS — N1831 Chronic kidney disease, stage 3a: Secondary | ICD-10-CM

## 2022-12-01 DIAGNOSIS — D62 Acute posthemorrhagic anemia: Secondary | ICD-10-CM | POA: Diagnosis present

## 2022-12-01 DIAGNOSIS — Z87891 Personal history of nicotine dependence: Secondary | ICD-10-CM

## 2022-12-01 DIAGNOSIS — D539 Nutritional anemia, unspecified: Secondary | ICD-10-CM | POA: Insufficient documentation

## 2022-12-01 DIAGNOSIS — Z79899 Other long term (current) drug therapy: Secondary | ICD-10-CM

## 2022-12-01 DIAGNOSIS — I252 Old myocardial infarction: Secondary | ICD-10-CM

## 2022-12-01 DIAGNOSIS — K828 Other specified diseases of gallbladder: Secondary | ICD-10-CM | POA: Diagnosis not present

## 2022-12-01 DIAGNOSIS — I2489 Other forms of acute ischemic heart disease: Secondary | ICD-10-CM | POA: Diagnosis present

## 2022-12-01 DIAGNOSIS — N183 Chronic kidney disease, stage 3 unspecified: Secondary | ICD-10-CM | POA: Diagnosis present

## 2022-12-01 DIAGNOSIS — W1830XA Fall on same level, unspecified, initial encounter: Secondary | ICD-10-CM | POA: Diagnosis present

## 2022-12-01 DIAGNOSIS — I471 Supraventricular tachycardia, unspecified: Secondary | ICD-10-CM | POA: Diagnosis present

## 2022-12-01 DIAGNOSIS — I5042 Chronic combined systolic (congestive) and diastolic (congestive) heart failure: Secondary | ICD-10-CM | POA: Diagnosis present

## 2022-12-01 DIAGNOSIS — E1122 Type 2 diabetes mellitus with diabetic chronic kidney disease: Secondary | ICD-10-CM | POA: Diagnosis present

## 2022-12-01 DIAGNOSIS — Z885 Allergy status to narcotic agent status: Secondary | ICD-10-CM

## 2022-12-01 DIAGNOSIS — E7800 Pure hypercholesterolemia, unspecified: Secondary | ICD-10-CM | POA: Diagnosis present

## 2022-12-01 HISTORY — DX: Chronic systolic (congestive) heart failure: I50.22

## 2022-12-01 HISTORY — DX: Chronic kidney disease, stage 3b: N18.32

## 2022-12-01 LAB — BASIC METABOLIC PANEL
Anion gap: 6 (ref 5–15)
BUN: 26 mg/dL — ABNORMAL HIGH (ref 8–23)
CO2: 11 mmol/L — ABNORMAL LOW (ref 22–32)
Calcium: 4.1 mg/dL — CL (ref 8.9–10.3)
Chloride: 123 mmol/L — ABNORMAL HIGH (ref 98–111)
Creatinine, Ser: 1.13 mg/dL — ABNORMAL HIGH (ref 0.44–1.00)
GFR, Estimated: 49 mL/min — ABNORMAL LOW (ref >60)
Glucose, Bld: 90 mg/dL (ref 70–99)
Potassium: 2.2 mmol/L — CL (ref 3.5–5.1)
Sodium: 140 mmol/L (ref 135–145)

## 2022-12-01 LAB — CBG MONITORING, ED: Glucose-Capillary: 149 mg/dL — ABNORMAL HIGH (ref 70–99)

## 2022-12-01 LAB — CBC WITH DIFFERENTIAL/PLATELET
Abs Immature Granulocytes: 0.02 10*3/uL (ref 0.00–0.07)
Basophils Absolute: 0 10*3/uL (ref 0.0–0.1)
Basophils Relative: 0 %
Eosinophils Absolute: 0.1 10*3/uL (ref 0.0–0.5)
Eosinophils Relative: 2 %
HCT: 20.5 % — ABNORMAL LOW (ref 36.0–46.0)
Hemoglobin: 6.4 g/dL — CL (ref 12.0–15.0)
Immature Granulocytes: 0 %
Lymphocytes Relative: 30 %
Lymphs Abs: 1.4 10*3/uL (ref 0.7–4.0)
MCH: 31.4 pg (ref 26.0–34.0)
MCHC: 31.2 g/dL (ref 30.0–36.0)
MCV: 100.5 fL — ABNORMAL HIGH (ref 80.0–100.0)
Monocytes Absolute: 0.3 10*3/uL (ref 0.1–1.0)
Monocytes Relative: 7 %
Neutro Abs: 2.8 10*3/uL (ref 1.7–7.7)
Neutrophils Relative %: 61 %
Platelets: 102 10*3/uL — ABNORMAL LOW (ref 150–400)
RBC: 2.04 MIL/uL — ABNORMAL LOW (ref 3.87–5.11)
RDW: 14.3 % (ref 11.5–15.5)
WBC: 4.7 10*3/uL (ref 4.0–10.5)
nRBC: 0 % (ref 0.0–0.2)

## 2022-12-01 LAB — URINALYSIS, ROUTINE W REFLEX MICROSCOPIC
Bilirubin Urine: NEGATIVE
Glucose, UA: >=500 mg/dL — AB
Ketones, ur: NEGATIVE mg/dL
Leukocytes,Ua: NEGATIVE
Nitrite: NEGATIVE
Protein, ur: NEGATIVE mg/dL
Specific Gravity, Urine: 1.013 (ref 1.005–1.030)
pH: 5 (ref 5.0–8.0)

## 2022-12-01 LAB — COMPREHENSIVE METABOLIC PANEL
ALT: 19 U/L (ref 0–44)
AST: 21 U/L (ref 15–41)
Albumin: 3.6 g/dL (ref 3.5–5.0)
Alkaline Phosphatase: 25 U/L — ABNORMAL LOW (ref 38–126)
Anion gap: 7 (ref 5–15)
BUN: 47 mg/dL — ABNORMAL HIGH (ref 8–23)
CO2: 22 mmol/L (ref 22–32)
Calcium: 8.8 mg/dL — ABNORMAL LOW (ref 8.9–10.3)
Chloride: 105 mmol/L (ref 98–111)
Creatinine, Ser: 2.43 mg/dL — ABNORMAL HIGH (ref 0.44–1.00)
GFR, Estimated: 20 mL/min — ABNORMAL LOW (ref >60)
Glucose, Bld: 154 mg/dL — ABNORMAL HIGH (ref 70–99)
Potassium: 6.2 mmol/L — ABNORMAL HIGH (ref 3.5–5.1)
Sodium: 134 mmol/L — ABNORMAL LOW (ref 135–145)
Total Bilirubin: 0.7 mg/dL (ref 0.3–1.2)
Total Protein: 6.6 g/dL (ref 6.5–8.1)

## 2022-12-01 LAB — RESP PANEL BY RT-PCR (RSV, FLU A&B, COVID)  RVPGX2
Influenza A by PCR: NEGATIVE
Influenza B by PCR: NEGATIVE
Resp Syncytial Virus by PCR: NEGATIVE
SARS Coronavirus 2 by RT PCR: NEGATIVE

## 2022-12-01 LAB — MAGNESIUM: Magnesium: 1.1 mg/dL — ABNORMAL LOW (ref 1.7–2.4)

## 2022-12-01 LAB — TROPONIN I (HIGH SENSITIVITY)
Troponin I (High Sensitivity): 7 ng/L (ref <18)
Troponin I (High Sensitivity): 32 ng/L — ABNORMAL HIGH (ref <18)
Troponin I (High Sensitivity): 85 ng/L — ABNORMAL HIGH (ref <18)

## 2022-12-01 LAB — POC OCCULT BLOOD, ED: Fecal Occult Bld: NEGATIVE

## 2022-12-01 LAB — ABO/RH: ABO/RH(D): A NEG

## 2022-12-01 LAB — PREPARE RBC (CROSSMATCH)

## 2022-12-01 LAB — CK: Total CK: 21 U/L — ABNORMAL LOW (ref 38–234)

## 2022-12-01 LAB — TSH: TSH: 2.478 u[IU]/mL (ref 0.350–4.500)

## 2022-12-01 LAB — LACTIC ACID, PLASMA: Lactic Acid, Venous: 1.2 mmol/L (ref 0.5–1.9)

## 2022-12-01 MED ORDER — ONDANSETRON HCL 4 MG PO TABS: 4.0000 mg | ORAL_TABLET | Freq: Four times a day (QID) | ORAL | Status: DC | PRN

## 2022-12-01 MED ORDER — SODIUM CHLORIDE 0.9% IV SOLUTION: Freq: Once | INTRAVENOUS | Status: AC

## 2022-12-01 MED ORDER — SODIUM CHLORIDE 0.9 % IV BOLUS
500.0000 mL | Freq: Once | INTRAVENOUS | Status: AC
  Administered 2022-12-02: 500 mL via INTRAVENOUS

## 2022-12-01 MED ORDER — SODIUM CHLORIDE 0.9 % IV SOLN: Freq: Once | INTRAVENOUS | Status: AC

## 2022-12-01 MED ORDER — APIXABAN 2.5 MG PO TABS
2.5000 mg | ORAL_TABLET | Freq: Two times a day (BID) | ORAL | Status: DC
  Administered 2022-12-01 – 2022-12-02 (×2): 2.5 mg via ORAL
  Filled 2022-12-01 (×2): qty 1

## 2022-12-01 MED ORDER — ONDANSETRON HCL 4 MG/2ML IJ SOLN: 4.0000 mg | Freq: Four times a day (QID) | INTRAMUSCULAR | Status: DC | PRN

## 2022-12-01 MED ORDER — ACETAMINOPHEN 325 MG PO TABS
650.0000 mg | ORAL_TABLET | Freq: Four times a day (QID) | ORAL | Status: DC | PRN
  Administered 2022-12-02 – 2022-12-08 (×6): 650 mg via ORAL
  Filled 2022-12-01 (×6): qty 2

## 2022-12-01 MED ORDER — SIMVASTATIN 20 MG PO TABS
20.0000 mg | ORAL_TABLET | Freq: Every evening | ORAL | Status: DC
  Administered 2022-12-02 – 2022-12-08 (×7): 20 mg via ORAL
  Filled 2022-12-01 (×7): qty 1

## 2022-12-01 MED ORDER — SODIUM ZIRCONIUM CYCLOSILICATE 10 G PO PACK
10.0000 g | PACK | Freq: Once | ORAL | Status: AC
  Administered 2022-12-02: 10 g via ORAL
  Filled 2022-12-01: qty 1

## 2022-12-01 MED ORDER — IOHEXOL 350 MG/ML SOLN
100.0000 mL | Freq: Once | INTRAVENOUS | Status: AC | PRN
  Administered 2022-12-01: 100 mL via INTRAVENOUS

## 2022-12-01 MED ORDER — CALCIUM GLUCONATE-NACL 1-0.675 GM/50ML-% IV SOLN
1.0000 g | Freq: Once | INTRAVENOUS | Status: AC
  Administered 2022-12-01: 1000 mg via INTRAVENOUS
  Filled 2022-12-01: qty 50

## 2022-12-01 MED ORDER — DIGOXIN 125 MCG PO TABS
0.0625 mg | ORAL_TABLET | ORAL | Status: DC
  Administered 2022-12-03 – 2022-12-05 (×2): 0.0625 mg via ORAL
  Filled 2022-12-01 (×3): qty 1

## 2022-12-01 MED ORDER — SACUBITRIL-VALSARTAN 24-26 MG PO TABS
1.0000 | ORAL_TABLET | Freq: Two times a day (BID) | ORAL | Status: DC
  Administered 2022-12-01: 1 via ORAL
  Filled 2022-12-01: qty 1

## 2022-12-01 MED ORDER — MAGNESIUM SULFATE 2 GM/50ML IV SOLN
2.0000 g | Freq: Once | INTRAVENOUS | Status: AC
  Administered 2022-12-01: 2 g via INTRAVENOUS
  Filled 2022-12-01: qty 50

## 2022-12-01 MED ORDER — POTASSIUM CHLORIDE 10 MEQ/100ML IV SOLN
10.0000 meq | INTRAVENOUS | Status: AC
  Administered 2022-12-01 (×3): 10 meq via INTRAVENOUS
  Filled 2022-12-01 (×3): qty 100

## 2022-12-01 MED ORDER — SPIRONOLACTONE 25 MG PO TABS
25.0000 mg | ORAL_TABLET | Freq: Every day | ORAL | Status: DC
  Administered 2022-12-01: 25 mg via ORAL
  Filled 2022-12-01: qty 1

## 2022-12-01 MED ORDER — METOPROLOL SUCCINATE ER 50 MG PO TB24
50.0000 mg | ORAL_TABLET | Freq: Every day | ORAL | Status: DC
  Administered 2022-12-01 – 2022-12-08 (×5): 50 mg via ORAL
  Filled 2022-12-01 (×7): qty 1

## 2022-12-01 MED ORDER — ACETAMINOPHEN 650 MG RE SUPP: 650.0000 mg | Freq: Four times a day (QID) | RECTAL | Status: DC | PRN

## 2022-12-01 NOTE — ED Notes (Signed)
Transported to CT 

## 2022-12-01 NOTE — ED Triage Notes (Signed)
EMS called for CPR that was initiated by pt's daughter, began to do chest compressions and felt a pop to chest, pt began to breathe per report. EMS arrived and found pt disoriented on arrival, en route pt began to be more alert and oriented.  Pt is A&O to age, place and year.  Pt denies any pain currently.  Hypotensive 70/40 per EMS. 20 G placed by ems to right hand.

## 2022-12-01 NOTE — ED Provider Notes (Signed)
[place] EMERGENCY DEPARTMENT AT [facility] Provider Note   CSN: [id number] Arrival date & time: 12/01/22  1601     History {Add pertinent medical, surgical, social history, OB history to HPI:1} Chief Complaint  Patient presents with   Altered Mental Status    Terry Hanson is a 82 y.o. female.  Patient is an 82 year old female with past medical history of asthma, arthritis, hypertension, hyperlipidemia, obesity, diabetes, congestive heart failure with a recent echocardiogram on 05/24/2022 with an EF of 20 to 25% with ICD presenting for syncope.  The patient's daughter states she heard a large thud coming from the patient's bedroom.  When she got to the patient was on the floor making a gurgling sound.  She denies any rhythmic shaking or seizure-like activity.  She states that she was trying to wake her mother out when the patient stopped breathing.  She states she called EMS and was told to start CPR.  The patient states she did several rounds of CPR when she heard a loud popping noise coming from her chest and the patient woke up.  On arrival to the emergency ointment, patient is alert and oriented x 3, neurovascularly intact, and following commands.  She states she has been feeling generalized weakness lately as well as minor cough over the past 3 weeks.  She denies any fevers, chills, nausea, vomiting, abdominal pain, dysuria, increased frequency, urgency, or diarrhea.  She denies any black or bloody stools.  Last pacemaker interrogation was 10/09/2022.  The history is provided by the patient (daughter). No language interpreter was used.  Altered Mental Status Associated symptoms: weakness   Associated symptoms: no abdominal pain, no fever, no palpitations, no rash, no seizures and no vomiting        Home Medications Prior to Admission medications   Medication Sig Start Date End Date Taking? Authorizing Provider  apixaban (ELIQUIS) 2.5 MG TABS tablet Take 1 tablet (2.5 mg  total) by mouth 2 (two) times daily. 06/29/22  Yes Mallipeddi, Vishnu P, MD  digoxin (LANOXIN) 0.125 MG tablet Take 0.5 tablets (0.0625 mg total) by mouth 3 (three) times a week. Taking Monday, Wednesday, and Friday. 04/18/22  Yes Lenze, Michele M, PA-C  FARXIGA 10 MG TABS tablet TAKE ONE TABLET (10 MG TOTAL) BY MOUTH DAILY AT 9 AM BEFORE BREAKFAST Patient taking differently: Take 10 mg by mouth daily. 08/30/22  Yes Mallipeddi, Vishnu P, MD  metoprolol succinate (TOPROL XL) 25 MG 24 hr tablet Take 1 tablet (25 mg total) by mouth daily. IN THE AM. Patient taking differently: Take 25 mg by mouth every morning. 10/09/22  Yes Marinus Maw, MD  metoprolol succinate (TOPROL-XL) 50 MG 24 hr tablet TAKE 1 TABLET(50 MG) BY MOUTH AT BEDTIME Patient taking differently: Take 50 mg by mouth at bedtime. 05/01/22  Yes Laurey Morale, MD  nitroGLYCERIN (NITROSTAT) 0.4 MG SL tablet Place 1 tablet (0.4 mg total) under the tongue every 5 (five) minutes as needed. 07/02/19  Yes Arty Baumgartner, NP  potassium chloride (KLOR-CON) 10 MEQ tablet Take 1 tablet (10 mEq total) by mouth 2 (two) times daily. 07/27/22  Yes Mallipeddi, Vishnu P, MD  sacubitril-valsartan (ENTRESTO) 24-26 MG Take 1 tablet by mouth 2 (two) times daily. 05/30/22  Yes Mallipeddi, Vishnu P, MD  simvastatin (ZOCOR) 20 MG tablet TAKE 1 TABLET(20 MG) BY MOUTH EVERY EVENING Patient taking differently: Take 20 mg by mouth every evening. 05/01/22  Yes Laurey Morale, MD  spironolactone (ALDACTONE) 25  MG tablet Take 1 tablet (25 mg total) by mouth at bedtime. 05/01/22  Yes Laurey Morale, MD  furosemide (LASIX) 20 MG tablet Take 1 tablet (20 mg total) by mouth daily. 05/01/22   Laurey Morale, MD  levothyroxine (SYNTHROID) 25 MCG tablet TAKE 1 TABLET(25 MCG) BY MOUTH DAILY BEFORE AND BREAKFAST 08/17/21   Lyn Records, MD  nystatin (MYCOSTATIN/NYSTOP) powder Apply 1 Application topically 2 (two) times daily. 10/09/21   Lyn Records, MD      Allergies     Codeine, Shrimp [shellfish allergy], Amiodarone, Penicillins, and Sulfa antibiotics    Review of Systems   Review of Systems  Constitutional:  Negative for chills and fever.  HENT:  Negative for ear pain and sore throat.   Eyes:  Negative for pain and visual disturbance.  Respiratory:  Positive for cough. Negative for shortness of breath.   Cardiovascular:  Negative for chest pain and palpitations.  Gastrointestinal:  Negative for abdominal pain and vomiting.  Genitourinary:  Negative for dysuria and hematuria.  Musculoskeletal:  Negative for arthralgias and back pain.  Skin:  Negative for color change and rash.  Neurological:  Positive for syncope and weakness. Negative for seizures.  All other systems reviewed and are negative.   Physical Exam Updated Vital Signs BP (!) 93/49   Pulse 80   Temp 97.9 F (36.6 C) (Oral)   Resp 13   Ht 5\' 2"  (1.575 m)   Wt 88 kg   SpO2 100%   BMI 35.48 kg/m  Physical Exam Vitals and nursing note reviewed.  Constitutional:      General: She is not in acute distress.    Appearance: She is well-developed.  HENT:     Head: Normocephalic and atraumatic.  Eyes:     General: Lids are normal. Vision grossly intact.     Conjunctiva/sclera: Conjunctivae normal.     Pupils: Pupils are equal, round, and reactive to light.  Cardiovascular:     Rate and Rhythm: Normal rate and regular rhythm.     Heart sounds: No murmur heard. Pulmonary:     Effort: Pulmonary effort is normal. No respiratory distress.     Breath sounds: Normal breath sounds.  Abdominal:     Palpations: Abdomen is soft.     Tenderness: There is no abdominal tenderness.  Musculoskeletal:        General: No swelling.     Cervical back: Neck supple. No bony tenderness.     Thoracic back: No bony tenderness.     Lumbar back: No bony tenderness.  Skin:    General: Skin is warm and dry.     Capillary Refill: Capillary refill takes less than 2 seconds.  Neurological:     Mental  Status: She is alert and oriented to person, place, and time.     GCS: GCS eye subscore is 4. GCS verbal subscore is 5. GCS motor subscore is 6.     Cranial Nerves: Cranial nerves 2-12 are intact.     Sensory: Sensation is intact.     Motor: Motor function is intact.     Coordination: Coordination is intact.  Psychiatric:        Mood and Affect: Mood normal.     ED Results / Procedures / Treatments   Labs (all labs ordered are listed, but only abnormal results are displayed) Labs Reviewed  CBC WITH DIFFERENTIAL/PLATELET - Abnormal; Notable for the following components:      Result Value  RBC 2.04 (*)    Hemoglobin 6.4 (*)    HCT 20.5 (*)    MCV 100.5 (*)    Platelets 102 (*)    All other components within normal limits  BASIC METABOLIC PANEL - Abnormal; Notable for the following components:   Potassium 2.2 (*)    Chloride 123 (*)    CO2 11 (*)    BUN 26 (*)    Creatinine, Ser 1.13 (*)    Calcium 4.1 (*)    GFR, Estimated 49 (*)    All other components within normal limits  CK - Abnormal; Notable for the following components:   Total CK 21 (*)    All other components within normal limits  MAGNESIUM - Abnormal; Notable for the following components:   Magnesium 1.1 (*)    All other components within normal limits  CBG MONITORING, ED - Abnormal; Notable for the following components:   Glucose-Capillary 149 (*)    All other components within normal limits  RESP PANEL BY RT-PCR (RSV, FLU A&B, COVID)  RVPGX2  CULTURE, BLOOD (ROUTINE X 2)  CULTURE, BLOOD (ROUTINE X 2)  LACTIC ACID, PLASMA  TSH  URINALYSIS, ROUTINE W REFLEX MICROSCOPIC  POC OCCULT BLOOD, ED  TYPE AND SCREEN  ABO/RH  PREPARE RBC (CROSSMATCH)  TROPONIN I (HIGH SENSITIVITY)  TROPONIN I (HIGH SENSITIVITY)    EKG None  Radiology CT Head Wo Contrast  Result Date: 12/01/2022 CLINICAL DATA:  [facility] called for CPR that was initiated by pt's daughter, began to do chest compressions and felt a pop to chest,  pt began to breathe per report. EMS arrived and found pt disoriented on arrival, en route pt began to be more alert and oriented. Pt is A&O to age, place and year. Pt denies any pain currently. Hypotensive 70/40 per EMS. 20 G placed by ems to right hand. EXAM: CT HEAD WITHOUT CONTRAST CT CERVICAL SPINE WITHOUT CONTRAST TECHNIQUE: Multidetector CT imaging of the head and cervical spine was performed following the standard protocol without intravenous contrast. Multiplanar CT image reconstructions of the cervical spine were also generated. RADIATION DOSE REDUCTION: This exam was performed according to the departmental dose-optimization program which includes automated exposure control, adjustment of the mA and/or kV according to patient size and/or use of iterative reconstruction technique. COMPARISON:  Head CT, 05/27/2018. FINDINGS: CT HEAD FINDINGS Brain: No evidence of acute infarction, hemorrhage, hydrocephalus, extra-axial collection or mass lesion/mass effect. 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  Age-appropriate volume loss. Vascular: No hyperdense vessel or unexpected calcification. Skull: Normal. Negative for fracture or focal lesion. Sinuses/Orbits: Globes and orbits are unremarkable. The visualized sinuses are clear. Other: None. CT CERVICAL SPINE FINDINGS Alignment: Straightened cervical lordosis.  No spondylolisthesis. Skull base and vertebrae: No acute fracture. No primary bone lesion or focal pathologic process. Soft tissues and spinal canal: No prevertebral fluid or swelling. No visible canal hematoma. Disc levels: Minor loss of disc height at C4-C5. Moderate loss of disc height at C5-C6. Mild disc bulging and endplate spurring most evident at C5-C6. No convincing disc herniation. No significant stenosis. Upper chest: No acute or significant abnormality. Other: None. IMPRESSION: HEAD CT 1. No acute intracranial abnormality CERVICAL CT 1. No fracture or acute finding. Electronically Signed   By: Amie Portland M.D.   On: 12/01/2022 18:05   CT Cervical Spine Wo Contrast  Result Date: 12/01/2022 CLINICAL DATA:  [facility] called for CPR that was initiated by pt's daughter, began to do chest compressions and felt  a pop to chest, pt began to breathe per report. EMS arrived and found pt disoriented on arrival, en route pt began to be more alert and oriented. Pt is A&O to age, place and year. Pt denies any pain currently. Hypotensive 70/40 per EMS. 20 G placed by ems to right hand. EXAM: CT HEAD WITHOUT CONTRAST CT CERVICAL SPINE WITHOUT CONTRAST TECHNIQUE: Multidetector CT imaging of the head and cervical spine was performed following the standard protocol without intravenous contrast. Multiplanar CT image reconstructions of the cervical spine were also generated. RADIATION DOSE REDUCTION: This exam was performed according to the departmental dose-optimization program which includes automated exposure control, adjustment of the mA and/or kV according to patient size and/or use of iterative reconstruction technique. COMPARISON:  Head CT, 05/27/2018. FINDINGS: CT HEAD FINDINGS Brain: No evidence of acute infarction, hemorrhage, hydrocephalus, extra-axial collection or mass lesion/mass effect. 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  Age-appropriate volume loss. Vascular: No hyperdense vessel or unexpected calcification. Skull: Normal. Negative for fracture or focal lesion. Sinuses/Orbits: Globes and orbits are unremarkable. The visualized sinuses are clear. Other: None. CT CERVICAL SPINE FINDINGS Alignment: Straightened cervical lordosis.  No spondylolisthesis. Skull base and vertebrae: No acute fracture. No primary bone lesion or focal pathologic process. Soft tissues and spinal canal: No prevertebral fluid or swelling. No visible canal hematoma. Disc levels: Minor loss of disc height at C4-C5. Moderate loss of disc height at C5-C6. Mild disc bulging and endplate spurring most evident at C5-C6. No convincing disc  herniation. No significant stenosis. Upper chest: No acute or significant abnormality. Other: None. IMPRESSION: HEAD CT 1. No acute intracranial abnormality CERVICAL CT 1. No fracture or acute finding. Electronically Signed   By: Amie Portland M.D.   On: 12/01/2022 18:05   DG Chest Portable 1 View  Result Date: 12/01/2022 CLINICAL DATA:  EMS called for CPR initiated by patient's daughter. During chest compressions, felt a pop to the chest. The patient then began breathing. Patient found disoriented on arrival. EXAM: PORTABLE CHEST 1 VIEW COMPARISON:  03/19/2020. FINDINGS: Cardiac silhouette borderline enlarged. Stable left anterior chest wall AICD. No mediastinal or hilar masses. Clear lungs.  No convincing pleural effusion or pneumothorax. Skeletal structures grossly intact. IMPRESSION: No active disease. Electronically Signed   By: Amie Portland M.D.   On: 12/01/2022 17:14    Procedures .Critical Care  Performed by: Franne Forts, DO Authorized by: Franne Forts, DO   Critical care provider statement:    Critical care time (minutes):  76   Critical care was necessary to treat or prevent imminent or life-threatening deterioration of the following conditions: acute anemia hbg 4 with syncope.   Critical care was time spent personally by me on the following activities:  Development of treatment plan with patient or surrogate, discussions with consultants, evaluation of patient's response to treatment, examination of patient, ordering and review of laboratory studies, ordering and review of radiographic studies, ordering and performing treatments and interventions, pulse oximetry, re-evaluation of patient's condition, review of old charts and blood draw for specimens   Care discussed with: admitting provider     {Document cardiac monitor, telemetry assessment procedure when appropriate:1}  Medications Ordered in ED Medications  potassium chloride 10 mEq in 100 mL IVPB (has no administration in  time range)  calcium gluconate 1 g/ 50 mL sodium chloride IVPB (1,000 mg Intravenous New Bag/Given 12/01/22 1815)  0.9 %  sodium chloride infusion (Manually program via Guardrails IV Fluids) (has no administration in time range)  0.9 %  sodium chloride infusion ( Intravenous Infusion Verify 12/01/22 1715)    ED Course/ Medical Decision Making/ A&P   {   Click here for ABCD2, HEART and other calculatorsREFRESH Note before signing :1}                              Medical Decision Making Amount and/or Complexity of Data Reviewed Labs: ordered. Radiology: ordered.  Risk Prescription drug management. Decision regarding hospitalization.   28:23 PM 82 year old female with past medical history of asthma, arthritis, hypertension, hyperlipidemia, obesity, diabetes, congestive heart failure with a recent echocardiogram on 05/24/2022 with an EF of 20 to 25% with ICD presenting for syncope.  Patient is alert oriented x 3, no acute distress, afebrile, stable vital signs.  Patient neurovascularly intact at this time.  No visible wounds or bleeding.  No gross deformities.  No spinal tenderness.  Laboratory studies concerning for anemia of 6.4, hypokalemia at 2.2, and hypocalcemia at 4.1.  10 mEq of potassium IV administered over 1 hour x 4 ordered.  Calcium gluconate 1 g ordered.    I spoke with patient regarding the risk first benefits of blood transfusion and she is agreeable at this time.  2 units of packed red blood cell ordered with transfusion rate of 1 unit/h.  Recommending admission for syncope secondary to hypotension from anemia.  {Document critical care time when appropriate:1} {Document review of labs and clinical decision tools ie heart score, Chads2Vasc2 etc:1}  {Document your independent review of radiology images, and any outside records:1} {Document your discussion with family members, caretakers, and with consultants:1} {Document social determinants of health affecting pt's  care:1} {Document your decision making why or why not admission, treatments were needed:1} Final Clinical Impression(s) / ED Diagnoses Final diagnoses:  Syncope, unspecified syncope type  Blunt head trauma, initial encounter  Anemia, unspecified type  Hypocalcemia  Hypotension, unspecified hypotension type  Hypokalemia    Rx / DC Orders ED Discharge Orders     None

## 2022-12-01 NOTE — ED Notes (Signed)
Dr Thomes Dinning has seen pt and family was present. Ok for pt to eat, family left to go get pt food

## 2022-12-01 NOTE — ED Notes (Signed)
ED TO INPATIENT HANDOFF REPORT  ED Nurse Name and Phone #:   S Name/Age/Gender Zonie W Sevigny 82 y.o. female Room/Bed: APA01/APA01  Code Status   Code Status: Prior  Home/SNF/Other Home Patient oriented to: self, place, time, and situation Is this baseline? Yes   Triage Complete: Triage complete  Chief Complaint Syncope [R55]  Triage Note EMS called for CPR that was initiated by pt's daughter, began to do chest compressions and felt a pop to chest, pt began to breathe per report. EMS arrived and found pt disoriented on arrival, en route pt began to be more alert and oriented.  Pt is A&O to age, place and year.  Pt denies any pain currently.  Hypotensive 70/40 per EMS. 20 G placed by ems to right hand.     Allergies Allergies  Allergen Reactions   Iodinated Contrast Media Shortness Of Breath    Per Patient- The last time she had contrast during a scan she had difficulty breathing   Codeine Hives   Shrimp [Shellfish Allergy] Other (See Comments)    Broke out   Amiodarone Nausea And Vomiting   Penicillins Itching and Rash    Did it involve swelling of the face/tongue/throat, SOB, or low BP? Yes Did it involve sudden or severe rash/hives, skin peeling, or any reaction on the inside of your mouth or nose? Yes Did you need to seek medical attention at a hospital or doctor's office? Yes When did it last happen? Over 10 years       If all above answers are "NO", may proceed with cephalosporin use.    Sulfa Antibiotics Itching    Level of Care/Admitting Diagnosis ED Disposition     ED Disposition  Admit   Condition  --   Comment  Hospital Area: [facility] [[id number]]  Level of Care: Stepdown [14]  Covid Evaluation: Asymptomatic - no recent exposure (last 10 days) testing not required  Diagnosis: Syncope [206001]  Admitting Physician: Frankey Shown [[id number]]  Attending Physician: Frankey Shown [[id number]]          B Medical/Surgery History Past Medical  History:  Diagnosis Date   Arthritis    Asthma    CHF (congestive heart failure) (HCC)    Diabetes mellitus (HCC)    Diabetes mellitus without complication (HCC)    Enlarged heart    Hypercholesterolemia    Hyperlipidemia    Ischemic cardiomyopathy    a. EF 30-35% by echo in 06/2019   STEMI (ST elevation myocardial infarction) (HCC)    a. 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   Past Surgical History:  Procedure Laterality Date   ABDOMINAL HYSTERECTOMY     ATRIAL FIBRILLATION ABLATION N/A 04/05/2021   Procedure: ATRIAL FIBRILLATION ABLATION;  Surgeon: Regan Lemming, MD;  Location: [facility] INVASIVE CV LAB;  Service: Cardiovascular;  Laterality: N/A;   CATARACT EXTRACTION W/PHACO Right 06/27/2020   Procedure: CATARACT EXTRACTION PHACO AND INTRAOCULAR LENS PLACEMENT RIGHT EYE AND PLACEMENT OF CORTICOSTEROID;  Surgeon: Fabio Pierce, MD;  Location: [facility] ORS;  Service: Ophthalmology;  Laterality: Right;  right CDE=25.47   CORONARY/GRAFT ACUTE MI REVASCULARIZATION N/A 06/29/2019   Procedure: Coronary/Graft Acute MI Revascularization;  Surgeon: Lyn Records, MD;  Location: [facility] INVASIVE CV LAB;  Service: Cardiovascular;  Laterality: N/A;   ICD IMPLANT N/A 12/14/2019   Procedure: ICD IMPLANT;  Surgeon: Marinus Maw, MD;  Location: [facility] INVASIVE CV LAB;  Service: Cardiovascular;  Laterality: N/A;   LEFT HEART CATH AND CORONARY ANGIOGRAPHY  N/A 06/29/2019   Procedure: LEFT HEART CATH AND CORONARY ANGIOGRAPHY;  Surgeon: Lyn Records, MD;  Location: [facility] INVASIVE CV LAB;  Service: Cardiovascular;  Laterality: N/A;   RIGHT HEART CATH N/A 12/30/2020   Procedure: RIGHT HEART CATH;  Surgeon: Laurey Morale, MD;  Location: [facility] INVASIVE CV LAB;  Service: Cardiovascular;  Laterality: N/A;   TEE WITHOUT CARDIOVERSION N/A 04/04/2021   Procedure: TRANSESOPHAGEAL ECHOCARDIOGRAM (TEE);  Surgeon: Jake Bathe, MD;  Location: [facility] ENDOSCOPY;  Service: Cardiovascular;  Laterality: N/A;     A IV  Location/Drains/Wounds Patient Lines/Drains/Airways Status     Active Line/Drains/Airways     Name Placement date Placement time Site Days   Peripheral IV 12/01/22 20 G Anterior;Right Hand 12/01/22  1640  Hand  less than 1   Peripheral IV 12/01/22 20 G Right Antecubital 12/01/22  1642  Antecubital  less than 1            Intake/Output Last 24 hours  Intake/Output Summary (Last 24 hours) at 12/01/2022 2212 Last data filed at 12/01/2022 2015 Gross per 24 hour  Intake 339.79 ml  Output --  Net 339.79 ml    Labs/Imaging Results for orders placed or performed during the hospital encounter of 12/01/22 (from the past 48 hour(s))  CBG monitoring, ED     Status: Abnormal   Collection Time: 12/01/22  4:08 PM  Result Value Ref Range   Glucose-Capillary 149 (H) 70 - 99 mg/dL    Comment: Glucose reference range applies only to samples taken after fasting for at least 8 hours.  CBC with Differential     Status: Abnormal   Collection Time: 12/01/22  4:30 PM  Result Value Ref Range   WBC 4.7 4.0 - 10.5 K/uL   RBC 2.04 (L) 3.87 - 5.11 MIL/uL   Hemoglobin 6.4 (LL) 12.0 - 15.0 g/dL    Comment: REPEATED TO VERIFY THIS CRITICAL RESULT HAS VERIFIED AND BEEN CALLED TO Azell Der BY JAMIE WOODIE ON 09 14 2024 AT 1719, AND HAS BEEN READ BACK.     HCT 20.5 (L) 36.0 - 46.0 %   MCV 100.5 (H) 80.0 - 100.0 fL   MCH 31.4 26.0 - 34.0 pg   MCHC 31.2 30.0 - 36.0 g/dL   RDW 66.0 63.0 - 16.0 %   Platelets 102 (L) 150 - 400 K/uL    Comment: SPECIMEN CHECKED FOR CLOTS   nRBC 0.0 0.0 - 0.2 %   Neutrophils Relative % 61 %   Neutro Abs 2.8 1.7 - 7.7 K/uL   Lymphocytes Relative 30 %   Lymphs Abs 1.4 0.7 - 4.0 K/uL   Monocytes Relative 7 %   Monocytes Absolute 0.3 0.1 - 1.0 K/uL   Eosinophils Relative 2 %   Eosinophils Absolute 0.1 0.0 - 0.5 K/uL   Basophils Relative 0 %   Basophils Absolute 0.0 0.0 - 0.1 K/uL   Immature Granulocytes 0 %   Abs Immature Granulocytes 0.02 0.00 - 0.07 K/uL    Comment:  Performed at [facility], [address] [place], [place] [address]  Basic metabolic panel     Status: Abnormal   Collection Time: 12/01/22  4:30 PM  Result Value Ref Range   Sodium 140 135 - 145 mmol/L   Potassium 2.2 (LL) 3.5 - 5.1 mmol/L    Comment: CRITICAL RESULT CALLED TO, READ BACK BY AND VERIFIED WITH MORGAN ENGES 12/01/22 1735 NN   Chloride 123 (H) 98 - 111 mmol/L   CO2 11 (L)  22 - 32 mmol/L   Glucose, Bld 90 70 - 99 mg/dL    Comment: Glucose reference range applies only to samples taken after fasting for at least 8 hours.   BUN 26 (H) 8 - 23 mg/dL   Creatinine, Ser 1.32 (H) 0.44 - 1.00 mg/dL   Calcium 4.1 (LL) 8.9 - 10.3 mg/dL    Comment: CRITICAL RESULT CALLED TO, READ BACK BY AND VERIFIED WITH MORGAN ENGES 12/01/22 1735 NN   GFR, Estimated 49 (L) >60 mL/min    Comment: (NOTE) Calculated using the CKD-EPI Creatinine Equation (2021)    Anion gap 6 5 - 15    Comment: Performed at [facility], [address], [place], [place] [address]  Troponin I (High Sensitivity)     Status: None   Collection Time: 12/01/22  4:30 PM  Result Value Ref Range   Troponin I (High Sensitivity) 7 <18 ng/L    Comment: (NOTE) Elevated high sensitivity troponin I (hsTnI) values and significant  changes across serial measurements may suggest ACS but many other  chronic and acute conditions are known to elevate hsTnI results.  Refer to the "Links" section for chest pain algorithms and additional  guidance. Performed at [facility], [address], [place], [place] [address]   CK     Status: Abnormal   Collection Time: 12/01/22  4:30 PM  Result Value Ref Range   Total CK 21 (L) 38 - 234 U/L    Comment: Performed at [facility], [address] [place], [place] [address]  Magnesium     Status: Abnormal   Collection Time: 12/01/22  4:30 PM  Result Value Ref Range   Magnesium 1.1 (L) 1.7 - 2.4 mg/dL    Comment: Performed at [facility], [address], [place], [place] [address]  Lactic acid,  plasma     Status: None   Collection Time: 12/01/22  4:32 PM  Result Value Ref Range   Lactic Acid, Venous 1.2 0.5 - 1.9 mmol/L    Comment: Performed at [facility], [address] [place], [place] [address]  Blood culture (routine x 2)     Status: None (Preliminary result)   Collection Time: 12/01/22  4:32 PM   Specimen: BLOOD  Result Value Ref Range   Specimen Description BLOOD RIGHT ANTECUBITAL    Special Requests      BOTTLES DRAWN AEROBIC AND ANAEROBIC Blood Culture results may not be optimal due to an excessive volume of blood received in culture bottles Performed at [facility], [address], [place], [place] [address]    Culture PENDING    Report Status PENDING   TSH     Status: None   Collection Time: 12/01/22  4:32 PM  Result Value Ref Range   TSH 2.478 0.350 - 4.500 uIU/mL    Comment: Performed by a 3rd Generation assay with a functional sensitivity of <=0.01 uIU/mL. Performed at [facility], [address], [place], [place] [address]   Resp panel by RT-PCR (RSV, Flu A&B, Covid) Anterior Nasal Swab     Status: None   Collection Time: 12/01/22  4:35 PM   Specimen: Anterior Nasal Swab  Result Value Ref Range   SARS Coronavirus 2 by RT PCR NEGATIVE NEGATIVE    Comment: (NOTE) SARS-CoV-2 target nucleic acids are NOT DETECTED.  The SARS-CoV-2 RNA is generally detectable in upper respiratory specimens during the acute phase of infection. The lowest concentration of SARS-CoV-2 viral copies this assay can detect is 138 copies/mL.  A negative result does not preclude SARS-Cov-2 infection and should not be used as the sole basis for treatment or other patient management decisions. A negative result may occur with  improper specimen collection/handling, submission of specimen other than nasopharyngeal swab, presence of viral mutation(s) within the areas targeted by this assay, and inadequate number of viral copies(<138 copies/mL). A negative result must be combined  with clinical observations, patient history, and epidemiological information. The expected result is Negative.  Fact Sheet for Patients:  BloggerCourse.com  Fact Sheet for Healthcare Providers:  SeriousBroker.it  This test is no t yet approved or cleared by the [place] FDA and  has been authorized for detection and/or diagnosis of SARS-CoV-2 by FDA under an Emergency Use Authorization (EUA). This EUA will remain  in effect (meaning this test can be used) for the duration of the COVID-19 declaration under Section 564(b)(1) of the Act, 21 U.S.C.section 360bbb-3(b)(1), unless the authorization is terminated  or revoked sooner.       Influenza A by PCR NEGATIVE NEGATIVE   Influenza B by PCR NEGATIVE NEGATIVE    Comment: (NOTE) The Xpert Xpress SARS-CoV-2/FLU/RSV plus assay is intended as an aid in the diagnosis of influenza from Nasopharyngeal swab specimens and should not be used as a sole basis for treatment. Nasal washings and aspirates are unacceptable for Xpert Xpress SARS-CoV-2/FLU/RSV testing.  Fact Sheet for Patients: BloggerCourse.com  Fact Sheet for Healthcare Providers: SeriousBroker.it  This test is not yet approved or cleared by the [place] FDA and has been authorized for detection and/or diagnosis of SARS-CoV-2 by FDA under an Emergency Use Authorization (EUA). This EUA will remain in effect (meaning this test can be used) for the duration of the COVID-19 declaration under Section 564(b)(1) of the Act, 21 U.S.C. section 360bbb-3(b)(1), unless the authorization is terminated or revoked.     Resp Syncytial Virus by PCR NEGATIVE NEGATIVE    Comment: (NOTE) Fact Sheet for Patients: BloggerCourse.com  Fact Sheet for Healthcare Providers: SeriousBroker.it  This test is not yet approved or cleared by the  [place] FDA and has been authorized for detection and/or diagnosis of SARS-CoV-2 by FDA under an Emergency Use Authorization (EUA). This EUA will remain in effect (meaning this test can be used) for the duration of the COVID-19 declaration under Section 564(b)(1) of the Act, 21 U.S.C. section 360bbb-3(b)(1), unless the authorization is terminated or revoked.  Performed at [facility], [address], [place], [place] [address]   ABO/Rh     Status: None   Collection Time: 12/01/22  4:45 PM  Result Value Ref Range   ABO/RH(D)      A NEG Performed at [facility], [address], [place], [place] [address]   Blood culture (routine x 2)     Status: None (Preliminary result)   Collection Time: 12/01/22  5:28 PM   Specimen: BLOOD  Result Value Ref Range   Specimen Description BLOOD BLOOD LEFT HAND    Special Requests      BOTTLES DRAWN AEROBIC ONLY Blood Culture results may not be optimal due to an excessive volume of blood received in culture bottles Performed at [facility], [address], [place], [place] [address]    Culture PENDING    Report Status PENDING   Type and screen [facility]     Status: None (Preliminary result)   Collection Time: 12/01/22  5:38 PM  Result Value Ref Range   ABO/RH(D) A NEG    Antibody Screen NEG  Sample Expiration 12/04/2022,2359    Unit Number [id number]    Blood Component Type RED CELLS,LR    Unit division 00    Status of Unit ALLOCATED    Transfusion Status OK TO TRANSFUSE    Crossmatch Result Compatible    Unit Number [id number]    Blood Component Type RED CELLS,LR    Unit division 00    Status of Unit ISSUED    Transfusion Status OK TO TRANSFUSE    Crossmatch Result      Compatible Performed at [facility], [address] [place], [place] [address]   Troponin I (High Sensitivity)     Status: Abnormal   Collection Time: 12/01/22  5:38 PM  Result Value Ref Range   Troponin I (High Sensitivity) 32 (H) <18 ng/L     Comment: RESULT CALLED TO, READ BACK BY AND VERIFIED WITH MORGAN ENEANS 12/01/22 1832 NN (NOTE) Elevated high sensitivity troponin I (hsTnI) values and significant  changes across serial measurements may suggest ACS but many other  chronic and acute conditions are known to elevate hsTnI results.  Refer to the "Links" section for chest pain algorithms and additional  guidance. Performed at [facility], [address] [place], [place] [address]   Prepare RBC (crossmatch)     Status: None   Collection Time: 12/01/22  5:38 PM  Result Value Ref Range   Order Confirmation      ORDER PROCESSED BY BLOOD BANK Performed at [facility], [address] [place], [place] [address]   Urinalysis, Routine w reflex microscopic -Urine, Clean Catch     Status: Abnormal   Collection Time: 12/01/22  6:09 PM  Result Value Ref Range   Color, Urine YELLOW YELLOW   APPearance HAZY (A) CLEAR   Specific Gravity, Urine 1.013 1.005 - 1.030   pH 5.0 5.0 - 8.0   Glucose, UA >=500 (A) NEGATIVE mg/dL   Hgb urine dipstick MODERATE (A) NEGATIVE   Bilirubin Urine NEGATIVE NEGATIVE   Ketones, ur NEGATIVE NEGATIVE mg/dL   Protein, ur NEGATIVE NEGATIVE mg/dL   Nitrite NEGATIVE NEGATIVE   Leukocytes,Ua NEGATIVE NEGATIVE   RBC / HPF 6-10 0 - 5 RBC/hpf   WBC, UA 6-10 0 - 5 WBC/hpf   Bacteria, UA RARE (A) NONE SEEN   Squamous Epithelial / HPF 0-5 0 - 5 /HPF   Mucus PRESENT    Hyaline Casts, UA PRESENT     Comment: Performed at [facility], [address], [place], [place] [address]  POC occult blood, ED     Status: None   Collection Time: 12/01/22  6:22 PM  Result Value Ref Range   Fecal Occult Bld NEGATIVE NEGATIVE   CT ABDOMEN PELVIS WO CONTRAST  Result Date: 12/01/2022 CLINICAL DATA:  Recent CPR, anemia EXAM: CT ABDOMEN AND PELVIS WITHOUT CONTRAST TECHNIQUE: Multidetector CT imaging of the abdomen and pelvis was performed following the standard protocol without IV contrast. RADIATION DOSE REDUCTION: This exam  was performed according to the departmental dose-optimization program which includes automated exposure control, adjustment of the mA and/or kV according to patient size and/or use of iterative reconstruction technique. COMPARISON:  None Available. FINDINGS: Lower chest: No acute abnormality. Hepatobiliary: Liver is within normal limits. Gallbladder is well distended with dependent density without calcification. This may represent poorly calcified stones or gallbladder sludge. No biliary dilatation is seen. Pancreas: Unremarkable. No pancreatic ductal dilatation or surrounding inflammatory changes. Spleen: Normal in size without focal abnormality. Adrenals/Urinary Tract: Adrenal glands are within normal  limits. Kidneys demonstrate a normal appearance bilaterally. No calculi or obstructive changes are seen. The bladder is partially distended. Stomach/Bowel: Diverticular change of the colon is noted without evidence of diverticulitis. The appendix is within normal limits. Small bowel and stomach are unremarkable. Vascular/Lymphatic: Aortic atherosclerosis. No enlarged abdominal or pelvic lymph nodes. Reproductive: Status post hysterectomy. No adnexal masses. Other: No abdominal wall hernia or abnormality. No abdominopelvic ascites. Musculoskeletal: No acute or significant osseous findings. IMPRESSION: Diverticulosis without diverticulitis. Dependent density within the gallbladder which may represent small stones or gallbladder sludge. No other focal abnormality is noted. Electronically Signed   By: Alcide Clever M.D.   On: 12/01/2022 19:43   CT Head Wo Contrast  Result Date: 12/01/2022 CLINICAL DATA:  [facility] called for CPR that was initiated by pt's daughter, began to do chest compressions and felt a pop to chest, pt began to breathe per report. EMS arrived and found pt disoriented on arrival, en route pt began to be more alert and oriented. Pt is A&O to age, place and year. Pt denies any pain currently.  Hypotensive 70/40 per EMS. 20 G placed by ems to right hand. EXAM: CT HEAD WITHOUT CONTRAST CT CERVICAL SPINE WITHOUT CONTRAST TECHNIQUE: Multidetector CT imaging of the head and cervical spine was performed following the standard protocol without intravenous contrast. Multiplanar CT image reconstructions of the cervical spine were also generated. RADIATION DOSE REDUCTION: This exam was performed according to the departmental dose-optimization program which includes automated exposure control, adjustment of the mA and/or kV according to patient size and/or use of iterative reconstruction technique. COMPARISON:  Head CT, 05/27/2018. FINDINGS: CT HEAD FINDINGS Brain: No evidence of acute infarction, hemorrhage, hydrocephalus, extra-axial collection or mass lesion/mass effect. 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  Age-appropriate volume loss. Vascular: No hyperdense vessel or unexpected calcification. Skull: Normal. Negative for fracture or focal lesion. Sinuses/Orbits: Globes and orbits are unremarkable. The visualized sinuses are clear. Other: None. CT CERVICAL SPINE FINDINGS Alignment: Straightened cervical lordosis.  No spondylolisthesis. Skull base and vertebrae: No acute fracture. No primary bone lesion or focal pathologic process. Soft tissues and spinal canal: No prevertebral fluid or swelling. No visible canal hematoma. Disc levels: Minor loss of disc height at C4-C5. Moderate loss of disc height at C5-C6. Mild disc bulging and endplate spurring most evident at C5-C6. No convincing disc herniation. No significant stenosis. Upper chest: No acute or significant abnormality. Other: None. IMPRESSION: HEAD CT 1. No acute intracranial abnormality CERVICAL CT 1. No fracture or acute finding. Electronically Signed   By: Amie Portland M.D.   On: 12/01/2022 18:05   CT Cervical Spine Wo Contrast  Result Date: 12/01/2022 CLINICAL DATA:  [facility] called for CPR that was initiated by pt's daughter, began to do chest  compressions and felt a pop to chest, pt began to breathe per report. EMS arrived and found pt disoriented on arrival, en route pt began to be more alert and oriented. Pt is A&O to age, place and year. Pt denies any pain currently. Hypotensive 70/40 per EMS. 20 G placed by ems to right hand. EXAM: CT HEAD WITHOUT CONTRAST CT CERVICAL SPINE WITHOUT CONTRAST TECHNIQUE: Multidetector CT imaging of the head and cervical spine was performed following the standard protocol without intravenous contrast. Multiplanar CT image reconstructions of the cervical spine were also generated. RADIATION DOSE REDUCTION: This exam was performed according to the departmental dose-optimization program which includes automated exposure control, adjustment of the mA and/or kV according to patient size and/or  use of iterative reconstruction technique. COMPARISON:  Head CT, 05/27/2018. FINDINGS: CT HEAD FINDINGS Brain: No evidence of acute infarction, hemorrhage, hydrocephalus, extra-axial collection or mass lesion/mass effect. 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  Age-appropriate volume loss. Vascular: No hyperdense vessel or unexpected calcification. Skull: Normal. Negative for fracture or focal lesion. Sinuses/Orbits: Globes and orbits are unremarkable. The visualized sinuses are clear. Other: None. CT CERVICAL SPINE FINDINGS Alignment: Straightened cervical lordosis.  No spondylolisthesis. Skull base and vertebrae: No acute fracture. No primary bone lesion or focal pathologic process. Soft tissues and spinal canal: No prevertebral fluid or swelling. No visible canal hematoma. Disc levels: Minor loss of disc height at C4-C5. Moderate loss of disc height at C5-C6. Mild disc bulging and endplate spurring most evident at C5-C6. No convincing disc herniation. No significant stenosis. Upper chest: No acute or significant abnormality. Other: None. IMPRESSION: HEAD CT 1. No acute intracranial abnormality CERVICAL CT 1. No fracture or acute  finding. Electronically Signed   By: Amie Portland M.D.   On: 12/01/2022 18:05   DG Chest Portable 1 View  Result Date: 12/01/2022 CLINICAL DATA:  EMS called for CPR initiated by patient's daughter. During chest compressions, felt a pop to the chest. The patient then began breathing. Patient found disoriented on arrival. EXAM: PORTABLE CHEST 1 VIEW COMPARISON:  03/19/2020. FINDINGS: Cardiac silhouette borderline enlarged. Stable left anterior chest wall AICD. No mediastinal or hilar masses. Clear lungs.  No convincing pleural effusion or pneumothorax. Skeletal structures grossly intact. IMPRESSION: No active disease. Electronically Signed   By: Amie Portland M.D.   On: 12/01/2022 17:14    Pending Labs Unresulted Labs (From admission, onward)     Start     Ordered   12/02/22 0500  Vitamin B12  Tomorrow morning,   R        12/01/22 2205   12/02/22 0500  Folate  Tomorrow morning,   R        12/01/22 2205   12/01/22 2157  MRSA Next Gen by PCR, Nasal  Once,   R        12/01/22 2156   12/01/22 2153  Comprehensive metabolic panel  ONCE - STAT,   STAT        12/01/22 2152            Vitals/Pain Today's Vitals   12/01/22 2032 12/01/22 2100 12/01/22 2130 12/01/22 2200  BP: 119/62 (!) 117/48 (!) 120/54 (!) 112/57  Pulse: 82 87 91 91  Resp: 17 20 20  (!) 23  Temp: 98 F (36.7 C)   98 F (36.7 C)  TempSrc: Oral     SpO2: 100% 100% 100% 100%  Weight:      Height:      PainSc:        Isolation Precautions No active isolations  Medications Medications  potassium chloride 10 mEq in 100 mL IVPB (10 mEq Intravenous New Bag/Given 12/01/22 2147)  0.9 %  sodium chloride infusion (100 mL/hr Intravenous Restarted 12/01/22 2053)  calcium gluconate 1 g/ 50 mL sodium chloride IVPB (0 mg Intravenous Stopped 12/01/22 1906)  0.9 %  sodium chloride infusion (Manually program via Guardrails IV Fluids) (0 mLs Intravenous Stopped 12/01/22 2053)  magnesium sulfate IVPB 2 g 50 mL (0 g Intravenous Stopped  12/01/22 2028)  iohexol (OMNIPAQUE) 350 MG/ML injection 100 mL (100 mLs Intravenous Contrast Given 12/01/22 1922)    Mobility walks     Focused Assessments    R Recommendations: See Admitting Provider Note  Report given to:   Additional Notes:

## 2022-12-01 NOTE — ED Notes (Signed)
Pt in Ct at this time

## 2022-12-01 NOTE — H&P (Signed)
History and Physical    Patient: Terry Hanson WJX:[id number] DOB: June 11, 1940 DOA: 12/01/2022 DOS: the patient was seen and examined on 12/01/2022 PCP: Pcp, No  Patient coming from: Home  Chief Complaint:  Chief Complaint  Patient presents with   Altered Mental Status   HPI: Terry Hanson is a 82 y.o. female with medical history significant of chronic systolic CHF, atrial fibrillation, CAD s/p stent placement (DES to proximal LAD 06/2019), CKD 3 who presents to the emergency department from home via EMS due to syncope.  Patient's daughter at bedside states that she heard a large thud coming from patient's bedroom, she quickly went to check on patient and saw her on the floor with a gurgling sound, she tried to wake her up when she noted the patient stopped breathing, she quickly called EMS and was told to start CPR.  After fifth round of chest compressions, she heard a loud popping sound from her chest and patient woke up.  EMS team transported patient to the ED for further evaluation and management.  Patient was alert and oriented x 3 on arrival to the ED.  Patient complained of about 3-week onset of generalized weakness and an occasional nonproductive mild cough.  She denies chest pain, shortness of breath, fever, chills, nausea, vomiting.  ED Course:  In the emergency department, BP on arrival was 93/49, respiratory rate was 27/min, other vital signs were within normal range.  Workup in the ED showed hemoglobin of 6.4, hematocrit 20.5, MCV 100.5, platelets 102.  BMP showed potassium 2.2, chloride 123, bicarb 11, BUN 26, creatinine 1.13, calcium 4.1, GFR 49.  Magnesium 1.1, total CK21, FOBT was negative, troponin 7 > 32.  Influenza A, B, SARS coronavirus 2, RSV was negative.  Blood culture pending. CT head without contrast showed no acute intracranial abnormality CT cervical spine without contrast showed no fracture or acute finding CT abdomen and pelvis showed diverticulosis without  diverticulitis. Chest x-ray showed no active disease IV hydration was provided, calcium, magnesium and potassium were replenished.  2 units of packed red blood cells were ordered to be transfused in the ED.  Hospitalist was asked to admit patient for further evaluation and management.  Review of Systems: Review of systems as noted in the HPI. All other systems reviewed and are negative.   Past Medical History:  Diagnosis Date   Arthritis    Asthma    CHF (congestive heart failure) (HCC)    Diabetes mellitus (HCC)    Diabetes mellitus without complication (HCC)    Enlarged heart    Hypercholesterolemia    Hyperlipidemia    Ischemic cardiomyopathy    a. EF 30-35% by echo in 06/2019   STEMI (ST elevation myocardial infarction) [facility])    a. 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   Past Surgical History:  Procedure Laterality Date   ABDOMINAL HYSTERECTOMY     ATRIAL FIBRILLATION ABLATION N/A 04/05/2021   Procedure: ATRIAL FIBRILLATION ABLATION;  Surgeon: Regan Lemming, MD;  Location: [facility] INVASIVE CV LAB;  Service: Cardiovascular;  Laterality: N/A;   CATARACT EXTRACTION W/PHACO Right 06/27/2020   Procedure: CATARACT EXTRACTION PHACO AND INTRAOCULAR LENS PLACEMENT RIGHT EYE AND PLACEMENT OF CORTICOSTEROID;  Surgeon: Fabio Pierce, MD;  Location: [facility] ORS;  Service: Ophthalmology;  Laterality: Right;  right CDE=25.47   CORONARY/GRAFT ACUTE MI REVASCULARIZATION N/A 06/29/2019   Procedure: Coronary/Graft Acute MI Revascularization;  Surgeon: Lyn Records, MD;  Location: [facility] INVASIVE CV LAB;  Service: Cardiovascular;  Laterality:  N/A;   ICD IMPLANT N/A 12/14/2019   Procedure: ICD IMPLANT;  Surgeon: Marinus Maw, MD;  Location: [facility] INVASIVE CV LAB;  Service: Cardiovascular;  Laterality: N/A;   LEFT HEART CATH AND CORONARY ANGIOGRAPHY N/A 06/29/2019   Procedure: LEFT HEART CATH AND CORONARY ANGIOGRAPHY;  Surgeon: Lyn Records, MD;  Location: [facility] INVASIVE CV LAB;  Service:  Cardiovascular;  Laterality: N/A;   RIGHT HEART CATH N/A 12/30/2020   Procedure: RIGHT HEART CATH;  Surgeon: Laurey Morale, MD;  Location: [facility] INVASIVE CV LAB;  Service: Cardiovascular;  Laterality: N/A;   TEE WITHOUT CARDIOVERSION N/A 04/04/2021   Procedure: TRANSESOPHAGEAL ECHOCARDIOGRAM (TEE);  Surgeon: Jake Bathe, MD;  Location: [facility] ENDOSCOPY;  Service: Cardiovascular;  Laterality: N/A;    Social History:  reports that she has never smoked. She has quit using smokeless tobacco.  Her smokeless tobacco use included chew. She reports that she does not currently use alcohol. She reports that she does not use drugs.   Allergies  Allergen Reactions   Iodinated Contrast Media Shortness Of Breath    Per Patient- The last time she had contrast during a scan she had difficulty breathing   Codeine Hives   Shrimp [Shellfish Allergy] Other (See Comments)    Broke out   Amiodarone Nausea And Vomiting   Penicillins Itching and Rash    Did it involve swelling of the face/tongue/throat, SOB, or low BP? Yes Did it involve sudden or severe rash/hives, skin peeling, or any reaction on the inside of your mouth or nose? Yes Did you need to seek medical attention at a hospital or doctor's office? Yes When did it last happen? Over 10 years       If all above answers are "NO", may proceed with cephalosporin use.    Sulfa Antibiotics Itching    Family History  Problem Relation Age of Onset   Heart disease Mother      Prior to Admission medications   Medication Sig Start Date End Date Taking? Authorizing Provider  apixaban (ELIQUIS) 2.5 MG TABS tablet Take 1 tablet (2.5 mg total) by mouth 2 (two) times daily. 06/29/22  Yes Mallipeddi, Vishnu P, MD  digoxin (LANOXIN) 0.125 MG tablet Take 0.5 tablets (0.0625 mg total) by mouth 3 (three) times a week. Taking Monday, Wednesday, and Friday. 04/18/22  Yes Lenze, Michele M, PA-C  FARXIGA 10 MG TABS tablet TAKE ONE TABLET (10 MG TOTAL) BY MOUTH DAILY AT 9 AM  BEFORE BREAKFAST Patient taking differently: Take 10 mg by mouth daily. 08/30/22  Yes Mallipeddi, Vishnu P, MD  furosemide (LASIX) 20 MG tablet Take 1 tablet (20 mg total) by mouth daily. 05/01/22  Yes Laurey Morale, MD  metoprolol succinate (TOPROL-XL) 50 MG 24 hr tablet TAKE 1 TABLET(50 MG) BY MOUTH AT BEDTIME Patient taking differently: Take 50 mg by mouth at bedtime. 05/01/22  Yes Laurey Morale, MD  nitroGLYCERIN (NITROSTAT) 0.4 MG SL tablet Place 1 tablet (0.4 mg total) under the tongue every 5 (five) minutes as needed. 07/02/19  Yes Arty Baumgartner, NP  potassium chloride (KLOR-CON) 10 MEQ tablet Take 1 tablet (10 mEq total) by mouth 2 (two) times daily. 07/27/22  Yes Mallipeddi, Vishnu P, MD  sacubitril-valsartan (ENTRESTO) 24-26 MG Take 1 tablet by mouth 2 (two) times daily. 05/30/22  Yes Mallipeddi, Vishnu P, MD  simvastatin (ZOCOR) 20 MG tablet TAKE 1 TABLET(20 MG) BY MOUTH EVERY EVENING Patient taking differently: Take 20 mg by mouth every evening. 05/01/22  Yes Laurey Morale, MD  spironolactone (ALDACTONE) 25 MG tablet Take 1 tablet (25 mg total) by mouth at bedtime. 05/01/22  Yes Laurey Morale, MD    Physical Exam: BP (!) 112/57   Pulse 91   Temp 98 F (36.7 C)   Resp (!) 23   Ht 5\' 2"  (1.575 m)   Wt 88 kg   SpO2 100%   BMI 35.48 kg/m   General: 81 y.o. year-old female well developed well nourished in no acute distress.  Alert and oriented x3. HEENT: NCAT, EOMI Neck: Supple, trachea medial Cardiovascular: Regular rate and rhythm with no rubs or gallops.  No thyromegaly or JVD noted.  No lower extremity edema. 2/4 pulses in all 4 extremities. Respiratory: Clear to auscultation with no wheezes or rales. Good inspiratory effort. Abdomen: Soft, nontender nondistended with normal bowel sounds x4 quadrants. Muskuloskeletal: No cyanosis, clubbing or edema noted bilaterally Neuro: CN II-XII intact, strength 5/5 x 4, sensation, reflexes intact Skin: No ulcerative lesions  noted or rashes Psychiatry: Judgement and insight appear normal. Mood is appropriate for condition and setting          Labs on Admission:  Basic Metabolic Panel: Recent Labs  Lab 12/01/22 1630  NA 140  K 2.2*  CL 123*  CO2 11*  GLUCOSE 90  BUN 26*  CREATININE 1.13*  CALCIUM 4.1*  MG 1.1*   Liver Function Tests: No results for input(s): "AST", "ALT", "ALKPHOS", "BILITOT", "PROT", "ALBUMIN" in the last 168 hours. No results for input(s): "LIPASE", "AMYLASE" in the last 168 hours. No results for input(s): "AMMONIA" in the last 168 hours. CBC: Recent Labs  Lab 12/01/22 1630  WBC 4.7  NEUTROABS 2.8  HGB 6.4*  HCT 20.5*  MCV 100.5*  PLT 102*   Cardiac Enzymes: Recent Labs  Lab 12/01/22 1630  CKTOTAL 21*    BNP (last 3 results) No results for input(s): "BNP" in the last 8760 hours.  ProBNP (last 3 results) No results for input(s): "PROBNP" in the last 8760 hours.  CBG: Recent Labs  Lab 12/01/22 1608  GLUCAP 149*    Radiological Exams on Admission: CT ABDOMEN PELVIS WO CONTRAST  Result Date: 12/01/2022 CLINICAL DATA:  Recent CPR, anemia EXAM: CT ABDOMEN AND PELVIS WITHOUT CONTRAST TECHNIQUE: Multidetector CT imaging of the abdomen and pelvis was performed following the standard protocol without IV contrast. RADIATION DOSE REDUCTION: This exam was performed according to the departmental dose-optimization program which includes automated exposure control, adjustment of the mA and/or kV according to patient size and/or use of iterative reconstruction technique. COMPARISON:  None Available. FINDINGS: Lower chest: No acute abnormality. Hepatobiliary: Liver is within normal limits. Gallbladder is well distended with dependent density without calcification. This may represent poorly calcified stones or gallbladder sludge. No biliary dilatation is seen. Pancreas: Unremarkable. No pancreatic ductal dilatation or surrounding inflammatory changes. Spleen: Normal in size  without focal abnormality. Adrenals/Urinary Tract: Adrenal glands are within normal limits. Kidneys demonstrate a normal appearance bilaterally. No calculi or obstructive changes are seen. The bladder is partially distended. Stomach/Bowel: Diverticular change of the colon is noted without evidence of diverticulitis. The appendix is within normal limits. Small bowel and stomach are unremarkable. Vascular/Lymphatic: Aortic atherosclerosis. No enlarged abdominal or pelvic lymph nodes. Reproductive: Status post hysterectomy. No adnexal masses. Other: No abdominal wall hernia or abnormality. No abdominopelvic ascites. Musculoskeletal: No acute or significant osseous findings. IMPRESSION: Diverticulosis without diverticulitis. Dependent density within the gallbladder which may represent small stones or gallbladder sludge. No  other focal abnormality is noted. Electronically Signed   By: Alcide Clever M.D.   On: 12/01/2022 19:43   CT Head Wo Contrast  Result Date: 12/01/2022 CLINICAL DATA:  [facility] called for CPR that was initiated by pt's daughter, began to do chest compressions and felt a pop to chest, pt began to breathe per report. EMS arrived and found pt disoriented on arrival, en route pt began to be more alert and oriented. Pt is A&O to age, place and year. Pt denies any pain currently. Hypotensive 70/40 per EMS. 20 G placed by ems to right hand. EXAM: CT HEAD WITHOUT CONTRAST CT CERVICAL SPINE WITHOUT CONTRAST TECHNIQUE: Multidetector CT imaging of the head and cervical spine was performed following the standard protocol without intravenous contrast. Multiplanar CT image reconstructions of the cervical spine were also generated. RADIATION DOSE REDUCTION: This exam was performed according to the departmental dose-optimization program which includes automated exposure control, adjustment of the mA and/or kV according to patient size and/or use of iterative reconstruction technique. COMPARISON:  Head CT,  05/27/2018. FINDINGS: CT HEAD FINDINGS Brain: No evidence of acute infarction, hemorrhage, hydrocephalus, extra-axial collection or mass lesion/mass effect. 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  Age-appropriate volume loss. Vascular: No hyperdense vessel or unexpected calcification. Skull: Normal. Negative for fracture or focal lesion. Sinuses/Orbits: Globes and orbits are unremarkable. The visualized sinuses are clear. Other: None. CT CERVICAL SPINE FINDINGS Alignment: Straightened cervical lordosis.  No spondylolisthesis. Skull base and vertebrae: No acute fracture. No primary bone lesion or focal pathologic process. Soft tissues and spinal canal: No prevertebral fluid or swelling. No visible canal hematoma. Disc levels: Minor loss of disc height at C4-C5. Moderate loss of disc height at C5-C6. Mild disc bulging and endplate spurring most evident at C5-C6. No convincing disc herniation. No significant stenosis. Upper chest: No acute or significant abnormality. Other: None. IMPRESSION: HEAD CT 1. No acute intracranial abnormality CERVICAL CT 1. No fracture or acute finding. Electronically Signed   By: Amie Portland M.D.   On: 12/01/2022 18:05   CT Cervical Spine Wo Contrast  Result Date: 12/01/2022 CLINICAL DATA:  [facility] called for CPR that was initiated by pt's daughter, began to do chest compressions and felt a pop to chest, pt began to breathe per report. EMS arrived and found pt disoriented on arrival, en route pt began to be more alert and oriented. Pt is A&O to age, place and year. Pt denies any pain currently. Hypotensive 70/40 per EMS. 20 G placed by ems to right hand. EXAM: CT HEAD WITHOUT CONTRAST CT CERVICAL SPINE WITHOUT CONTRAST TECHNIQUE: Multidetector CT imaging of the head and cervical spine was performed following the standard protocol without intravenous contrast. Multiplanar CT image reconstructions of the cervical spine were also generated. RADIATION DOSE REDUCTION: This exam was performed  according to the departmental dose-optimization program which includes automated exposure control, adjustment of the mA and/or kV according to patient size and/or use of iterative reconstruction technique. COMPARISON:  Head CT, 05/27/2018. FINDINGS: CT HEAD FINDINGS Brain: No evidence of acute infarction, hemorrhage, hydrocephalus, extra-axial collection or mass lesion/mass effect. 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  Age-appropriate volume loss. Vascular: No hyperdense vessel or unexpected calcification. Skull: Normal. Negative for fracture or focal lesion. Sinuses/Orbits: Globes and orbits are unremarkable. The visualized sinuses are clear. Other: None. CT CERVICAL SPINE FINDINGS Alignment: Straightened cervical lordosis.  No spondylolisthesis. Skull base and vertebrae: No acute fracture. No primary bone lesion or focal pathologic process. Soft tissues and spinal  canal: No prevertebral fluid or swelling. No visible canal hematoma. Disc levels: Minor loss of disc height at C4-C5. Moderate loss of disc height at C5-C6. Mild disc bulging and endplate spurring most evident at C5-C6. No convincing disc herniation. No significant stenosis. Upper chest: No acute or significant abnormality. Other: None. IMPRESSION: HEAD CT 1. No acute intracranial abnormality CERVICAL CT 1. No fracture or acute finding. Electronically Signed   By: Amie Portland M.D.   On: 12/01/2022 18:05   DG Chest Portable 1 View  Result Date: 12/01/2022 CLINICAL DATA:  EMS called for CPR initiated by patient's daughter. During chest compressions, felt a pop to the chest. The patient then began breathing. Patient found disoriented on arrival. EXAM: PORTABLE CHEST 1 VIEW COMPARISON:  03/19/2020. FINDINGS: Cardiac silhouette borderline enlarged. Stable left anterior chest wall AICD. No mediastinal or hilar masses. Clear lungs.  No convincing pleural effusion or pneumothorax. Skeletal structures grossly intact. IMPRESSION: No active disease.  Electronically Signed   By: Amie Portland M.D.   On: 12/01/2022 17:14    EKG: I independently viewed the EKG done and my findings are as followed: Normal sinus rhythm at a rate of 76 bpm  Assessment/Plan Present on Admission:  Syncope  Chronic systolic CHF (congestive heart failure) (HCC)  CAD (coronary artery disease)  Chronic kidney disease, stage 3a (HCC)  Principal Problem:   Syncope Active Problems:   Chronic systolic CHF (congestive heart failure) (HCC)   CAD (coronary artery disease)   Chronic kidney disease, stage 3a (HCC)   Fall at home, initial encounter   Hypomagnesemia   Hypokalemia   Hypocalcemia   Symptomatic anemia   Thrombocytopenia (HCC)   Macrocytic anemia   Persistent atrial fibrillation (HCC)   Obesity (BMI 30-39.9)  Syncope Continue telemetry and watch for arrhythmias Troponins 7 > 32 ; continue to trend troponin EKG showed normal sinus rhythm at a rate of 76 bpm Echocardiogram done on 05/24/2022 showed an EF of 20 to 25%.  LV has severely decreased function.  Elevated LA pressure. Echocardiogram will be done to rule out significant aortic stenosis or other outflow obstruction, and also to evaluate EF and to rule out segmental/Regional wall motion abnormalities.  Carotid artery Dopplers will be done to rule out hemodynamically significant stenosis  Fall at home Continue fall precaution Continue PT/OT eval and treat  Electrolyte abnormalities (hypomagnesemia, hypokalemia, hypocalcemia) Continue telemetry Calcium, Magnesium and potassium were replenished Continue to monitor electrolytes and replete accordingly  Acute symptomatic anemia Hemoglobin 6.4, this was 13.2 on 10/09/2021 FOBT was negative Type and screen was done and 2 units of PRBC ordered for transfusion in the ED Continue to monitor H&H  Elevated troponin possibly secondary to type II demand ischemia Troponin 7 > 32 > 85, patient complains of chest soreness which corresponds to site of  chest compression performed by daughter at home.  Continue to trend troponin  Thrombocytopenia Platelets 102, continue to monitor with morning labs  Macrocytic Anemia MCV 100.5; vitamin B12 and folate levels will be checked  Chronic systolic CHF This was due to ischemic cardiomyopathy Echocardiogram done on 05/24/2022 showed an EF of 20 to 25%. She is s/p [place] Jude ICD Continue Toprol-XL, Entresto, Zocor, spironolactone, digoxin  Persistent atrial fibrillation Continue Toprol-XL, Eliquis, digoxin  CAD s/p stent placement (DES to proximal LAD 06/2019) Continue Zocor, Toprol-XL, Eliquis   CKD 3A  Creatinine 1.13 (baseline creatinine at 1.7-2.0) Renally adjust medications, avoid nephrotoxic agents/dehydration/hypotension  Obesity(BMI 35.48) Diet and lifestyle modification  Hyperkalemia  Repeated CMP showed overcorrection of potassium at 6.2 IV NS 500 mL x 1 will be given Lokelma will be given Continue telemetry   DVT prophylaxis: Eliquis   Advance Care Planning: CODE STATUS: Full code   Consults: None  Family Communication: Daughter and son at bedside (all questions answered to satisfaction)  Severity of Illness: The appropriate patient status for this patient is OBSERVATION. Observation status is judged to be reasonable and necessary in order to provide the required intensity of service to ensure the patient's safety. The patient's presenting symptoms, physical exam findings, and initial radiographic and laboratory data in the context of their medical condition is felt to place them at decreased risk for further clinical deterioration. Furthermore, it is anticipated that the patient will be medically stable for discharge from the hospital within 2 midnights of admission.   Author: Frankey Shown, DO 12/01/2022 10:34 PM  For on call review [email or web address].

## 2022-12-02 ENCOUNTER — Observation Stay (HOSPITAL_COMMUNITY): Payer: No Typology Code available for payment source

## 2022-12-02 DIAGNOSIS — I130 Hypertensive heart and chronic kidney disease with heart failure and stage 1 through stage 4 chronic kidney disease, or unspecified chronic kidney disease: Secondary | ICD-10-CM | POA: Diagnosis not present

## 2022-12-02 DIAGNOSIS — I469 Cardiac arrest, cause unspecified: Secondary | ICD-10-CM | POA: Diagnosis not present

## 2022-12-02 DIAGNOSIS — E876 Hypokalemia: Secondary | ICD-10-CM | POA: Diagnosis not present

## 2022-12-02 DIAGNOSIS — I10 Essential (primary) hypertension: Secondary | ICD-10-CM | POA: Diagnosis not present

## 2022-12-02 DIAGNOSIS — I2510 Atherosclerotic heart disease of native coronary artery without angina pectoris: Secondary | ICD-10-CM | POA: Diagnosis not present

## 2022-12-02 DIAGNOSIS — D62 Acute posthemorrhagic anemia: Secondary | ICD-10-CM | POA: Diagnosis not present

## 2022-12-02 DIAGNOSIS — E1122 Type 2 diabetes mellitus with diabetic chronic kidney disease: Secondary | ICD-10-CM | POA: Diagnosis not present

## 2022-12-02 DIAGNOSIS — E785 Hyperlipidemia, unspecified: Secondary | ICD-10-CM | POA: Diagnosis not present

## 2022-12-02 DIAGNOSIS — I255 Ischemic cardiomyopathy: Secondary | ICD-10-CM | POA: Diagnosis not present

## 2022-12-02 DIAGNOSIS — I4729 Other ventricular tachycardia: Secondary | ICD-10-CM | POA: Diagnosis not present

## 2022-12-02 DIAGNOSIS — I959 Hypotension, unspecified: Secondary | ICD-10-CM

## 2022-12-02 DIAGNOSIS — I4819 Other persistent atrial fibrillation: Secondary | ICD-10-CM

## 2022-12-02 DIAGNOSIS — E7800 Pure hypercholesterolemia, unspecified: Secondary | ICD-10-CM | POA: Diagnosis not present

## 2022-12-02 DIAGNOSIS — D649 Anemia, unspecified: Secondary | ICD-10-CM | POA: Diagnosis not present

## 2022-12-02 DIAGNOSIS — Y92009 Unspecified place in unspecified non-institutional (private) residence as the place of occurrence of the external cause: Secondary | ICD-10-CM | POA: Diagnosis not present

## 2022-12-02 DIAGNOSIS — I2489 Other forms of acute ischemic heart disease: Secondary | ICD-10-CM | POA: Diagnosis not present

## 2022-12-02 DIAGNOSIS — N1832 Chronic kidney disease, stage 3b: Secondary | ICD-10-CM | POA: Diagnosis not present

## 2022-12-02 DIAGNOSIS — D519 Vitamin B12 deficiency anemia, unspecified: Secondary | ICD-10-CM | POA: Diagnosis not present

## 2022-12-02 DIAGNOSIS — R55 Syncope and collapse: Secondary | ICD-10-CM | POA: Diagnosis not present

## 2022-12-02 DIAGNOSIS — E538 Deficiency of other specified B group vitamins: Secondary | ICD-10-CM

## 2022-12-02 DIAGNOSIS — I4901 Ventricular fibrillation: Secondary | ICD-10-CM | POA: Diagnosis not present

## 2022-12-02 DIAGNOSIS — R042 Hemoptysis: Secondary | ICD-10-CM | POA: Diagnosis not present

## 2022-12-02 DIAGNOSIS — N179 Acute kidney failure, unspecified: Secondary | ICD-10-CM | POA: Diagnosis not present

## 2022-12-02 DIAGNOSIS — I428 Other cardiomyopathies: Secondary | ICD-10-CM | POA: Diagnosis not present

## 2022-12-02 DIAGNOSIS — Z1152 Encounter for screening for COVID-19: Secondary | ICD-10-CM | POA: Diagnosis not present

## 2022-12-02 DIAGNOSIS — D539 Nutritional anemia, unspecified: Secondary | ICD-10-CM | POA: Diagnosis not present

## 2022-12-02 DIAGNOSIS — K625 Hemorrhage of anus and rectum: Secondary | ICD-10-CM

## 2022-12-02 DIAGNOSIS — Z95810 Presence of automatic (implantable) cardiac defibrillator: Secondary | ICD-10-CM | POA: Diagnosis not present

## 2022-12-02 DIAGNOSIS — E669 Obesity, unspecified: Secondary | ICD-10-CM

## 2022-12-02 DIAGNOSIS — N183 Chronic kidney disease, stage 3 unspecified: Secondary | ICD-10-CM | POA: Diagnosis not present

## 2022-12-02 DIAGNOSIS — W1830XA Fall on same level, unspecified, initial encounter: Secondary | ICD-10-CM | POA: Diagnosis present

## 2022-12-02 DIAGNOSIS — E875 Hyperkalemia: Secondary | ICD-10-CM | POA: Diagnosis not present

## 2022-12-02 DIAGNOSIS — I5022 Chronic systolic (congestive) heart failure: Secondary | ICD-10-CM | POA: Diagnosis not present

## 2022-12-02 DIAGNOSIS — J45909 Unspecified asthma, uncomplicated: Secondary | ICD-10-CM | POA: Diagnosis not present

## 2022-12-02 DIAGNOSIS — I471 Supraventricular tachycardia, unspecified: Secondary | ICD-10-CM | POA: Diagnosis not present

## 2022-12-02 DIAGNOSIS — D696 Thrombocytopenia, unspecified: Secondary | ICD-10-CM | POA: Diagnosis not present

## 2022-12-02 DIAGNOSIS — I350 Nonrheumatic aortic (valve) stenosis: Secondary | ICD-10-CM | POA: Diagnosis not present

## 2022-12-02 DIAGNOSIS — I6523 Occlusion and stenosis of bilateral carotid arteries: Secondary | ICD-10-CM | POA: Diagnosis not present

## 2022-12-02 LAB — COMPREHENSIVE METABOLIC PANEL
ALT: 16 U/L (ref 0–44)
ALT: 18 U/L (ref 0–44)
AST: 15 U/L (ref 15–41)
AST: 19 U/L (ref 15–41)
Albumin: 3.2 g/dL — ABNORMAL LOW (ref 3.5–5.0)
Albumin: 3.3 g/dL — ABNORMAL LOW (ref 3.5–5.0)
Alkaline Phosphatase: 21 U/L — ABNORMAL LOW (ref 38–126)
Alkaline Phosphatase: 23 U/L — ABNORMAL LOW (ref 38–126)
Anion gap: 8 (ref 5–15)
Anion gap: 9 (ref 5–15)
BUN: 46 mg/dL — ABNORMAL HIGH (ref 8–23)
BUN: 48 mg/dL — ABNORMAL HIGH (ref 8–23)
CO2: 21 mmol/L — ABNORMAL LOW (ref 22–32)
CO2: 21 mmol/L — ABNORMAL LOW (ref 22–32)
Calcium: 8.4 mg/dL — ABNORMAL LOW (ref 8.9–10.3)
Calcium: 8.7 mg/dL — ABNORMAL LOW (ref 8.9–10.3)
Chloride: 104 mmol/L (ref 98–111)
Chloride: 104 mmol/L (ref 98–111)
Creatinine, Ser: 2.18 mg/dL — ABNORMAL HIGH (ref 0.44–1.00)
Creatinine, Ser: 2.31 mg/dL — ABNORMAL HIGH (ref 0.44–1.00)
GFR, Estimated: 21 mL/min — ABNORMAL LOW (ref >60)
GFR, Estimated: 22 mL/min — ABNORMAL LOW (ref >60)
Glucose, Bld: 108 mg/dL — ABNORMAL HIGH (ref 70–99)
Glucose, Bld: 130 mg/dL — ABNORMAL HIGH (ref 70–99)
Potassium: 4.8 mmol/L (ref 3.5–5.1)
Potassium: 6 mmol/L — ABNORMAL HIGH (ref 3.5–5.1)
Sodium: 133 mmol/L — ABNORMAL LOW (ref 135–145)
Sodium: 134 mmol/L — ABNORMAL LOW (ref 135–145)
Total Bilirubin: 0.9 mg/dL (ref 0.3–1.2)
Total Bilirubin: 1.1 mg/dL (ref 0.3–1.2)
Total Protein: 6 g/dL — ABNORMAL LOW (ref 6.5–8.1)
Total Protein: 6.3 g/dL — ABNORMAL LOW (ref 6.5–8.1)

## 2022-12-02 LAB — CUP PACEART REMOTE DEVICE CHECK
Battery Remaining Longevity: 88 mo
Battery Remaining Percentage: 73 %
Battery Voltage: 2.99 V
Brady Statistic RV Percent Paced: 1 %
Date Time Interrogation Session: 20240913091241
HighPow Impedance: 89 Ohm
Implantable Lead Connection Status: 753985
Implantable Lead Implant Date: 20210927
Implantable Lead Location: 753860
Implantable Pulse Generator Implant Date: 20210927
Lead Channel Impedance Value: 450 Ohm
Lead Channel Pacing Threshold Amplitude: 1 V
Lead Channel Pacing Threshold Pulse Width: 0.5 ms
Lead Channel Sensing Intrinsic Amplitude: 11.6 mV
Lead Channel Setting Pacing Amplitude: 2.5 V
Lead Channel Setting Pacing Pulse Width: 0.5 ms
Lead Channel Setting Sensing Sensitivity: 0.5 mV
Pulse Gen Serial Number: 810006603
Zone Setting Status: 755011

## 2022-12-02 LAB — MRSA NEXT GEN BY PCR, NASAL: MRSA by PCR Next Gen: NOT DETECTED

## 2022-12-02 LAB — CBC
HCT: 34.2 % — ABNORMAL LOW (ref 36.0–46.0)
Hemoglobin: 11.4 g/dL — ABNORMAL LOW (ref 12.0–15.0)
MCH: 32.2 pg (ref 26.0–34.0)
MCHC: 33.3 g/dL (ref 30.0–36.0)
MCV: 96.6 fL (ref 80.0–100.0)
Platelets: 163 10*3/uL (ref 150–400)
RBC: 3.54 MIL/uL — ABNORMAL LOW (ref 3.87–5.11)
RDW: 14.7 % (ref 11.5–15.5)
WBC: 7.5 10*3/uL (ref 4.0–10.5)
nRBC: 0 % (ref 0.0–0.2)

## 2022-12-02 LAB — ECHOCARDIOGRAM COMPLETE
AR max vel: 1.14 cm2
AV Area VTI: 1.13 cm2
AV Area mean vel: 1.06 cm2
AV Mean grad: 8 mmHg
AV Peak grad: 12.5 mmHg
Ao pk vel: 1.77 m/s
Area-P 1/2: 2.99 cm2
Height: 62 in
MV VTI: 1.27 cm2
S' Lateral: 3.9 cm
Weight: 3319.25 [oz_av]

## 2022-12-02 LAB — HEMOGLOBIN AND HEMATOCRIT, BLOOD
HCT: 36.1 % (ref 36.0–46.0)
Hemoglobin: 12.2 g/dL (ref 12.0–15.0)

## 2022-12-02 LAB — TROPONIN I (HIGH SENSITIVITY): Troponin I (High Sensitivity): 80 ng/L — ABNORMAL HIGH (ref <18)

## 2022-12-02 LAB — FOLATE: Folate: 5.6 ng/mL — ABNORMAL LOW (ref >5.9)

## 2022-12-02 LAB — MAGNESIUM: Magnesium: 2.9 mg/dL — ABNORMAL HIGH (ref 1.7–2.4)

## 2022-12-02 LAB — VITAMIN B12: Vitamin B-12: 153 pg/mL — ABNORMAL LOW (ref 180–914)

## 2022-12-02 LAB — PHOSPHORUS: Phosphorus: 4.1 mg/dL (ref 2.5–4.6)

## 2022-12-02 MED ORDER — PERFLUTREN LIPID MICROSPHERE
1.0000 mL | INTRAVENOUS | Status: AC | PRN
  Administered 2022-12-02: 2 mL via INTRAVENOUS

## 2022-12-02 MED ORDER — ORAL CARE MOUTH RINSE: 15.0000 mL | OROMUCOSAL | Status: DC | PRN

## 2022-12-02 MED ORDER — CYANOCOBALAMIN 1000 MCG/ML IJ SOLN
1000.0000 ug | Freq: Every day | INTRAMUSCULAR | Status: AC
  Administered 2022-12-02 – 2022-12-08 (×7): 1000 ug via INTRAMUSCULAR
  Filled 2022-12-02 (×7): qty 1

## 2022-12-02 MED ORDER — CHLORHEXIDINE GLUCONATE CLOTH 2 % EX PADS
6.0000 | MEDICATED_PAD | Freq: Every day | CUTANEOUS | Status: DC
  Administered 2022-12-01 – 2022-12-05 (×3): 6 via TOPICAL

## 2022-12-02 MED ORDER — FOLIC ACID 1 MG PO TABS
1.0000 mg | ORAL_TABLET | Freq: Every day | ORAL | Status: DC
  Administered 2022-12-02 – 2022-12-09 (×7): 1 mg via ORAL
  Filled 2022-12-02 (×8): qty 1

## 2022-12-02 MED ORDER — PANTOPRAZOLE SODIUM 40 MG IV SOLR
40.0000 mg | Freq: Two times a day (BID) | INTRAVENOUS | Status: DC
  Administered 2022-12-02 – 2022-12-09 (×15): 40 mg via INTRAVENOUS
  Filled 2022-12-02 (×15): qty 10

## 2022-12-02 NOTE — Consult Note (Addendum)
Katrinka Blazing, M.D. Gastroenterology & Hepatology                                           Patient Name: Terry Hanson Account #: @FLAACCTNO @   MRN: [id number] Admission Date: 12/01/2022 Date of Evaluation:  12/02/2022 Time of Evaluation: 11:37 AM   Referring Physician: Vassie Loll, MD  Chief Complaint:  Severe anemia, syncope  HPI:  This is a 82 y.o. female with history of CHF with ejection fraction of 20 to 25% status post ICD placement, arthritis, asthma, diabetes, hyperlipidemia, history of STEMI status post stent placement, atrial fibrillation, who was brought to the hospital after presenting an episode of syncope.  Family is at the bedside and helps providing history.  The patient reports that she was feeling weak yesterday evening and had a syncopal event.  The family member states that she hit her head against the floor.  The daughter called EMS and she was advised to start doing chest compressions as her mother had "agonal breathing".  She did 5 chest compressions and the patient started breathing on her own.  She has never had a similar episode in the past.  Notably, she denies having any melena, hematochezia, hematemesis, abdominal pain, abdominal distention, fever or chills.  However, she reports that today in the morning she had some fresh blood in her stool.  Last dose of Eliquis was today in the morning.  Patient reports she had a colonoscopy when she was in her 70s but did not have any more colonoscopies afterwards as she was "incompletely sedated and did not want to repeat this unless really necessary".  No reports are available, she had this performed with an outside gastroenterologist.  In the ED, the patient was hypotensive 68/48 with heart rate of 76 and respiratory rate of 21, and afebrile.  The patient was given crystalloids boluses.  Labs were remarkable for hemoglobin 6.4, MCV 100.5, platelets 102, WBC 4.7, BMP with potassium 2.2, chloride 123, sodium 140, calcium 4.1,  creatinine 1.13, BUN 26.  TSH was 2.47 folic acid was low at 5.6  Patient was transfused 2 units of PRBC.  Repeat hemoglobin was 11.4 today in the morning.  FHx: neg for any gastrointestinal/liver disease, sister had breast cancer Surgical: no abdominal surgeries  Past Medical History: SEE CHRONIC ISSSUES: Past Medical History:  Diagnosis Date   Arthritis    Asthma    CHF (congestive heart failure) (HCC)    Diabetes mellitus (HCC)    Diabetes mellitus without complication (HCC)    Enlarged heart    Hypercholesterolemia    Hyperlipidemia    Ischemic cardiomyopathy    a. EF 30-35% by echo in 06/2019   STEMI (ST elevation myocardial infarction) (HCC)    a. 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   Past Surgical History:  Past Surgical History:  Procedure Laterality Date   ABDOMINAL HYSTERECTOMY     ATRIAL FIBRILLATION ABLATION N/A 04/05/2021   Procedure: ATRIAL FIBRILLATION ABLATION;  Surgeon: Regan Lemming, MD;  Location: [facility] INVASIVE CV LAB;  Service: Cardiovascular;  Laterality: N/A;   CATARACT EXTRACTION W/PHACO Right 06/27/2020   Procedure: CATARACT EXTRACTION PHACO AND INTRAOCULAR LENS PLACEMENT RIGHT EYE AND PLACEMENT OF CORTICOSTEROID;  Surgeon: Fabio Pierce, MD;  Location: [facility];  Service: Ophthalmology;  Laterality: Right;  right CDE=25.47   CORONARY/GRAFT ACUTE MI REVASCULARIZATION N/A 06/29/2019  Procedure: Coronary/Graft Acute MI Revascularization;  Surgeon: Lyn Records, MD;  Location: [facility] INVASIVE CV LAB;  Service: Cardiovascular;  Laterality: N/A;   ICD IMPLANT N/A 12/14/2019   Procedure: ICD IMPLANT;  Surgeon: Marinus Maw, MD;  Location: [facility] INVASIVE CV LAB;  Service: Cardiovascular;  Laterality: N/A;   LEFT HEART CATH AND CORONARY ANGIOGRAPHY N/A 06/29/2019   Procedure: LEFT HEART CATH AND CORONARY ANGIOGRAPHY;  Surgeon: Lyn Records, MD;  Location: [facility] INVASIVE CV LAB;  Service: Cardiovascular;  Laterality: N/A;   RIGHT HEART CATH N/A 12/30/2020    Procedure: RIGHT HEART CATH;  Surgeon: Laurey Morale, MD;  Location: [facility] INVASIVE CV LAB;  Service: Cardiovascular;  Laterality: N/A;   TEE WITHOUT CARDIOVERSION N/A 04/04/2021   Procedure: TRANSESOPHAGEAL ECHOCARDIOGRAM (TEE);  Surgeon: Jake Bathe, MD;  Location: [facility] ENDOSCOPY;  Service: Cardiovascular;  Laterality: N/A;   Family History:  Family History  Problem Relation Age of Onset   Heart disease Mother    Social History:  Social History   Tobacco Use   Smoking status: Never   Smokeless tobacco: Former    Types: Chew   Tobacco comments:    Former chew tobacco 05/03/21  Vaping Use   Vaping status: Never Used  Substance Use Topics   Alcohol use: Not Currently   Drug use: Never    Home Medications:  Prior to Admission medications   Medication Sig Start Date End Date Taking? Authorizing Provider  apixaban (ELIQUIS) 2.5 MG TABS tablet Take 1 tablet (2.5 mg total) by mouth 2 (two) times daily. 06/29/22  Yes Mallipeddi, Vishnu P, MD  digoxin (LANOXIN) 0.125 MG tablet Take 0.5 tablets (0.0625 mg total) by mouth 3 (three) times a week. Taking Monday, Wednesday, and Friday. 04/18/22  Yes Lenze, Michele M, PA-C  FARXIGA 10 MG TABS tablet TAKE ONE TABLET (10 MG TOTAL) BY MOUTH DAILY AT 9 AM BEFORE BREAKFAST Patient taking differently: Take 10 mg by mouth daily. 08/30/22  Yes Mallipeddi, Vishnu P, MD  furosemide (LASIX) 20 MG tablet Take 1 tablet (20 mg total) by mouth daily. 05/01/22  Yes Laurey Morale, MD  metoprolol succinate (TOPROL-XL) 50 MG 24 hr tablet TAKE 1 TABLET(50 MG) BY MOUTH AT BEDTIME Patient taking differently: Take 50 mg by mouth at bedtime. 05/01/22  Yes Laurey Morale, MD  nitroGLYCERIN (NITROSTAT) 0.4 MG SL tablet Place 1 tablet (0.4 mg total) under the tongue every 5 (five) minutes as needed. 07/02/19  Yes Arty Baumgartner, NP  potassium chloride (KLOR-CON) 10 MEQ tablet Take 1 tablet (10 mEq total) by mouth 2 (two) times daily. 07/27/22  Yes Mallipeddi, Vishnu  P, MD  sacubitril-valsartan (ENTRESTO) 24-26 MG Take 1 tablet by mouth 2 (two) times daily. 05/30/22  Yes Mallipeddi, Vishnu P, MD  simvastatin (ZOCOR) 20 MG tablet TAKE 1 TABLET(20 MG) BY MOUTH EVERY EVENING Patient taking differently: Take 20 mg by mouth every evening. 05/01/22  Yes Laurey Morale, MD  spironolactone (ALDACTONE) 25 MG tablet Take 1 tablet (25 mg total) by mouth at bedtime. 05/01/22  Yes Laurey Morale, MD    Inpatient Medications:  Current Facility-Administered Medications:    acetaminophen (TYLENOL) tablet 650 mg, 650 mg, Oral, Q6H PRN, 650 mg at 12/02/22 1123 **OR** acetaminophen (TYLENOL) suppository 650 mg, 650 mg, Rectal, Q6H PRN, Adefeso, Oladapo, DO   apixaban (ELIQUIS) tablet 2.5 mg, 2.5 mg, Oral, BID, Adefeso, Oladapo, DO, 2.5 mg at 12/02/22 1033   Chlorhexidine Gluconate Cloth 2 % PADS 6  each, 6 each, Topical, Daily, Adefeso, Oladapo, DO, 6 each at 12/01/22 2250   [START ON 12/03/2022] digoxin (LANOXIN) tablet 0.0625 mg, 0.0625 mg, Oral, Once per day on Monday Wednesday Friday, Adefeso, Oladapo, DO   metoprolol succinate (TOPROL-XL) 24 hr tablet 50 mg, 50 mg, Oral, QHS, Adefeso, Oladapo, DO, 50 mg at 12/01/22 2256   ondansetron (ZOFRAN) tablet 4 mg, 4 mg, Oral, Q6H PRN **OR** ondansetron (ZOFRAN) injection 4 mg, 4 mg, Intravenous, Q6H PRN, Adefeso, Oladapo, DO   Oral care mouth rinse, 15 mL, Mouth Rinse, PRN, Adefeso, Oladapo, DO   simvastatin (ZOCOR) tablet 20 mg, 20 mg, Oral, QPM, Adefeso, Oladapo, DO Allergies: Iodinated contrast media, Codeine, Shrimp [shellfish allergy], Amiodarone, Penicillins, and Sulfa antibiotics  Complete Review of Systems: GENERAL: negative for malaise, night sweats HEENT: No changes in hearing or vision, no nose bleeds or other nasal problems. NECK: Negative for lumps, goiter, pain and significant neck swelling RESPIRATORY: Negative for cough, wheezing CARDIOVASCULAR: Negative for chest pain, leg swelling, palpitations, orthopnea GI:  SEE HPI MUSCULOSKELETAL: Negative for joint pain or swelling, back pain, and muscle pain. SKIN: Negative for lesions, rash PSYCH: Negative for sleep disturbance, mood disorder and recent psychosocial stressors. HEMATOLOGY Negative for prolonged bleeding, bruising easily, and swollen nodes. ENDOCRINE: Negative for cold or heat intolerance, polyuria, polydipsia and goiter. NEURO: negative for tremor, gait imbalance, syncope and seizures. The remainder of the review of systems is noncontributory.  Physical Exam: BP (!) 128/45   Pulse 74   Temp (!) 97.5 F (36.4 C) (Oral)   Resp (!) 21   Ht 5\' 2"  (1.575 m)   Wt 94.1 kg   SpO2 100%   BMI 37.94 kg/m  GENERAL: The patient is AO x3, in no acute distress. Obese. HEENT: Head is normocephalic and atraumatic. EOMI are intact. Mouth is well hydrated and without lesions. NECK: Supple. No masses LUNGS: Clear to auscultation. No presence of rhonchi/wheezing/rales. Adequate chest expansion HEART: RRR, normal s1 and s2. ABDOMEN: Soft, nontender, no guarding, no peritoneal signs, and nondistended. BS +. No masses. EXTREMITIES: Without any cyanosis, clubbing, rash, lesions or edema. NEUROLOGIC: AOx3, no focal motor deficit. SKIN: no jaundice, no rashes  Laboratory Data CBC:     Component Value Date/Time   WBC 7.5 12/02/2022 0417   RBC 3.54 (L) 12/02/2022 0417   HGB 11.4 (L) 12/02/2022 0417   HGB 13.2 10/09/2021 1147   HCT 34.2 (L) 12/02/2022 0417   HCT 39.6 10/09/2021 1147   PLT 163 12/02/2022 0417   PLT 214 10/09/2021 1147   MCV 96.6 12/02/2022 0417   MCV 90 10/09/2021 1147   MCH 32.2 12/02/2022 0417   MCHC 33.3 12/02/2022 0417   RDW 14.7 12/02/2022 0417   RDW 13.7 10/09/2021 1147   LYMPHSABS 1.4 12/01/2022 1630   MONOABS 0.3 12/01/2022 1630   EOSABS 0.1 12/01/2022 1630   BASOSABS 0.0 12/01/2022 1630   COAG:  Lab Results  Component Value Date   INR 2.2 (H) 07/27/2019   INR 1.0 06/29/2019    BMP:     Latest Ref Rng & Units  12/02/2022    4:17 AM 12/02/2022   12:12 AM 12/01/2022   10:15 PM  BMP  Glucose 70 - 99 mg/dL 623  762  831   BUN 8 - 23 mg/dL 48  46  47   Creatinine 0.44 - 1.00 mg/dL 5.17  6.16  0.73   Sodium 135 - 145 mmol/L 133  134  134   Potassium 3.5 -  5.1 mmol/L 4.8  6.0  6.2   Chloride 98 - 111 mmol/L 104  104  105   CO2 22 - 32 mmol/L 21  21  22    Calcium 8.9 - 10.3 mg/dL 8.4  8.7  8.8     HEPATIC:     Latest Ref Rng & Units 12/02/2022    4:17 AM 12/02/2022   12:12 AM 12/01/2022   10:15 PM  Hepatic Function  Total Protein 6.5 - 8.1 g/dL 6.0  6.3  6.6   Albumin 3.5 - 5.0 g/dL 3.2  3.3  3.6   AST 15 - 41 U/L 15  19  21    ALT 0 - 44 U/L 16  18  19    Alk Phosphatase 38 - 126 U/L 21  23  25    Total Bilirubin 0.3 - 1.2 mg/dL 0.9  1.1  0.7     CARDIAC:  Lab Results  Component Value Date   CKTOTAL 21 (L) 12/01/2022   TROPONINI <0.03 05/27/2018     Imaging: I personally reviewed and interpreted the available imaging.  Assessment & Plan: Terry Hanson is a 82 y.o. female with history of CHF with ejection fraction of 20 to 25% status post ICD placement, arthritis, asthma, diabetes, hyperlipidemia, history of STEMI status post stent placement, atrial fibrillation, who was brought to the hospital after presenting an episode of syncope.  The patient has not presented any overt gastrointestinal bleeding but was found to have severe anemia requiring transfusion of 2 units PRBC.  Notably, she was also significantly hypotensive.  She had some rectal bleeding today but no previous reports of ongoing gastrointestinal bleeding.  It is unclear why she has presented such a profound anemia but I discussed with the patient and the family the importance of having bidirectional endoscopic evaluation to explore this further.  They are in agreement with this.  This will be performed tentatively on Tuesday as she took Eliquis today.  We will request cardiology evaluation/clearance given her episode of syncope -it  sounds less likely that she had a cardiac arrest as she did not complete 1 round of chest compressions but it will be important to have interrogation of the ICD device.  For now, she can continue with PPI twice daily.  Notably, her folic acid is low, supplementation should be started  - Repeat CBC qday, transfuse if Hb <8 - Pantoprazole 40 mg q12h IVP - 2 large bore IV lines - Active T/S - Stop Eliquis -Start clear liquid diet tomorrow - Avoid NSAIDs -Please consult cardiology to obtain clearance and interrogation of ICD device -Daily folic acid supplementation. - Will proceed with EGD and colonoscopy on Tuesday if cleared by cardiology  Katrinka Blazing, MD Gastroenterology and Hepatology [facility] Gastroenterology

## 2022-12-02 NOTE — Evaluation (Signed)
Physical Therapy Evaluation Patient Details Name: Terry Hanson MRN: [id number] DOB: 1941/01/02 Today's Date: 12/02/2022  History of Present Illness  Terry Hanson is a 82 y.o. female with medical history significant of chronic systolic CHF, atrial fibrillation, CAD s/p stent placement (DES to proximal LAD 06/2019), CKD 3 who presents to the emergency department from home via EMS due to syncope.  Patient's daughter at bedside states that she heard a large thud coming from patient's bedroom, she quickly went to check on patient and saw her on the floor with a gurgling sound, she tried to wake her up when she noted the patient stopped breathing, she quickly called EMS and was told to start CPR.  After fifth round of chest compressions, she heard a loud popping sound from her chest and patient woke up.  EMS team transported patient to the ED for further evaluation and management.  Patient was alert and oriented x 3 on arrival to the ED.  Patient complained of about 3-week onset of generalized weakness and an occasional nonproductive mild cough.  She denies chest pain, shortness of breath, fever, chills, nausea, vomiting.    Clinical Impression  Patient sitting on toilet on therapist arrival; assisted by nursing.  Daughter present at bedside. Patient needs min A to stand up off the toilet to RW and to clean up after toileting.  Noted blood in her stool. Nursing notified.  Patient ambulates with RW and CGA to min A from toilet to chair; slow gait speed and forward flexed trunk noted due to trunk and leg weakness.  Patient needs min A to sit down with controlled descent into chair.  patient left in chair with call button in reach and nursing notified of mobility status; daughter present.  Patient will benefit from continued skilled therapy services during the remainder of her hospital stay and at the next recommended venue of care to address deficits and promote return to optimal function.           If plan is  discharge home, recommend the following: A little help with walking and/or transfers;A little help with bathing/dressing/bathroom;Help with stairs or ramp for entrance;Assistance with cooking/housework   Can travel by private vehicle        Equipment Recommendations None recommended by PT  Recommendations for Other Services       Functional Status Assessment Patient has had a recent decline in their functional status and demonstrates the ability to make significant improvements in function in a reasonable and predictable amount of time.     Precautions / Restrictions Precautions Precautions: Fall Restrictions Weight Bearing Restrictions: No      Mobility  Bed Mobility                 Patient Response: Cooperative  Transfers Overall transfer level: Needs assistance Equipment used: Rolling walker (2 wheels) Transfers: Sit to/from Stand, Bed to chair/wheelchair/BSC Sit to Stand: Min assist Stand pivot transfers: Min assist              Ambulation/Gait Ambulation/Gait assistance: Contact guard assist Gait Distance (Feet): 10 Feet Assistive device: Rolling walker (2 wheels) Gait Pattern/deviations: Trunk flexed, Wide base of support       General Gait Details: decreased gait speed  Stairs            Wheelchair Mobility     Tilt Bed Tilt Bed Patient Response: Cooperative  Modified Rankin (Stroke Patients Only)       Balance Overall balance assessment: Needs assistance  Sitting-balance support: Feet supported Sitting balance-Leahy Scale: Good Sitting balance - Comments: good sitting balance on toilet; able to lean forward to clean herself after toileting   Standing balance support: Bilateral upper extremity supported, During functional activity, Reliant on assistive device for balance Standing balance-Leahy Scale: Fair Standing balance comment: fair to good standing balance with RW                             Pertinent  Vitals/Pain Pain Assessment Pain Assessment: 0-10 Pain Score: 3  Pain Location: chest soreness from recieving CPR Pain Descriptors / Indicators: Sore Pain Intervention(s): Limited activity within patient's tolerance, Monitored during session    Home Living Family/patient expects to be discharged to:: Private residence Living Arrangements: Children Available Help at Discharge: Family;Available 24 hours/day Type of Home: Mobile home Home Access: Stairs to enter Entrance Stairs-Rails: None Entrance Stairs-Number of Steps: 3 total   Home Layout: One level Home Equipment: BSC/3in1;Rolling Walker (2 wheels);Cane - single point;Tub bench      Prior Function Prior Level of Function : Independent/Modified Independent;History of Falls (last six months)                     Extremity/Trunk Assessment   Upper Extremity Assessment Upper Extremity Assessment: Generalized weakness    Lower Extremity Assessment Lower Extremity Assessment: Generalized weakness    Cervical / Trunk Assessment Cervical / Trunk Assessment: Kyphotic  Communication   Communication Communication: No apparent difficulties  Cognition Arousal: Alert Behavior During Therapy: WFL for tasks assessed/performed Overall Cognitive Status: Within Functional Limits for tasks assessed                                          General Comments General comments (skin integrity, edema, etc.): noted blood in her stool; nursing notified    Exercises     Assessment/Plan    PT Assessment Patient needs continued PT services  PT Problem List Decreased strength;Decreased activity tolerance;Decreased balance;Decreased mobility;Pain       PT Treatment Interventions Gait training;Stair training;Functional mobility training;Therapeutic activities;Therapeutic exercise;Balance training;Patient/family education    PT Goals (Current goals can be found in the Care Plan section)  Acute Rehab PT  Goals Patient Stated Goal: to return home PT Goal Formulation: With patient/family Time For Goal Achievement: 12/16/22 Potential to Achieve Goals: Good    Frequency Min 2X/week     Co-evaluation               AM-PAC PT "6 Clicks" Mobility  Outcome Measure Help needed turning from your back to your side while in a flat bed without using bedrails?: A Little Help needed moving from lying on your back to sitting on the side of a flat bed without using bedrails?: A Little Help needed moving to and from a bed to a chair (including a wheelchair)?: A Little Help needed standing up from a chair using your arms (e.g., wheelchair or bedside chair)?: A Little Help needed to walk in hospital room?: A Little Help needed climbing 3-5 steps with a railing? : A Lot 6 Click Score: 17    End of Session   Activity Tolerance: Patient tolerated treatment well;Patient limited by fatigue Patient left: in chair;with family/visitor present;with nursing/sitter in room Nurse Communication: Mobility status PT Visit Diagnosis: Unsteadiness on feet (R26.81);Muscle weakness (generalized) (M62.81);History of falling (Z91.81);Pain  Pain - part of body:  (chest)    Time: 2130-8657 PT Time Calculation (min) (ACUTE ONLY): 21 min   Charges:   PT Evaluation $PT Eval Low Complexity: 1 Low   PT General Charges $$ ACUTE PT VISIT: 1 Visit         11:33 AM, 12/02/22 Kinza Gouveia Small Jahquez Steffler MPT [place] [facility] [place] [phone] Ph:[phone]

## 2022-12-02 NOTE — TOC Initial Note (Signed)
Transition of Care [facility]) - Initial/Assessment Note    Patient Details  Name: Terry Hanson MRN: [id number] Date of Birth: Oct 18, 1940  Transition of [facility]) CM/SW Contact:    Villa Herb, LCSWA Phone Number: 12/02/2022, 12:40 PM  Clinical Narrative:                 PT is recommending HH PT for pt at D/C. CSW spoke with pt about recommendation, pt is agreeable to this being set up. Pt states that she stays alone most of the time. Pt has needed DME in the home but does not use unless needed. TOC to follow.   Expected Discharge Plan: Home w Home Health Services Barriers to Discharge: Continued Medical Work up   Patient Goals and CMS Choice Patient states their goals for this hospitalization and ongoing recovery are:: return home CMS Medicare.gov Compare Post Acute Care list provided to:: Patient Choice offered to / list presented to : Patient      Expected Discharge Plan and Services In-house Referral: Clinical Social Work Discharge Planning Services: CM Consult Post Acute Care Choice: Home Health Living arrangements for the past 2 months: Single Family Home                                      Prior Living Arrangements/Services Living arrangements for the past 2 months: Single Family Home Lives with:: Self Patient language and need for interpreter reviewed:: Yes Do you feel safe going back to the place where you live?: Yes      Need for Family Participation in Patient Care: Yes (Comment) Care giver support system in place?: Yes (comment) Current home services: DME Criminal Activity/Legal Involvement Pertinent to Current Situation/Hospitalization: No - Comment as needed  Activities of Daily Living Home Assistive Devices/Equipment: Cane (specify quad or straight), Dentures (specify type), Shower chair with back ADL Screening (condition at time of admission) Patient's cognitive ability adequate to safely complete daily activities?: Yes Is the patient deaf or have  difficulty hearing?: No Does the patient have difficulty seeing, even when wearing glasses/contacts?: No Does the patient have difficulty concentrating, remembering, or making decisions?: No Patient able to express need for assistance with ADLs?: Yes Does the patient have difficulty dressing or bathing?: No Independently performs ADLs?: Yes (appropriate for developmental age) Does the patient have difficulty walking or climbing stairs?: Yes Weakness of Legs: None Weakness of Arms/Hands: None  Permission Sought/Granted                  Emotional Assessment Appearance:: Appears stated age Attitude/Demeanor/Rapport: Engaged Affect (typically observed): Accepting Orientation: : Oriented to Self, Oriented to Place, Oriented to  Time, Oriented to Situation Alcohol / Substance Use: Not Applicable Psych Involvement: No (comment)  Admission diagnosis:  Hypocalcemia [E83.51] Hypokalemia [E87.6] Hypomagnesemia [E83.42] Syncope [R55] Blunt head trauma, initial encounter [S09.8XXA] Hypotension, unspecified hypotension type [I95.9] Syncope, unspecified syncope type [R55] Anemia, unspecified type [D64.9] Patient Active Problem List   Diagnosis Date Noted   Syncope 12/01/2022   Fall at home, initial encounter 12/01/2022   Hypomagnesemia 12/01/2022   Hypokalemia 12/01/2022   Hypocalcemia 12/01/2022   Symptomatic anemia 12/01/2022   Thrombocytopenia (HCC) 12/01/2022   Macrocytic anemia 12/01/2022   Persistent atrial fibrillation (HCC) 12/01/2022   Obesity (BMI 30-39.9) 12/01/2022   ERRONEOUS ENCOUNTER--DISREGARD 08/02/2022   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   Acute respiratory failure with hypercapnia (HCC) 03/20/2020  Atrial fibrillation, chronic (HCC) 03/20/2020   COVID-19 virus infection 03/19/2020   Atrial fibrillation with RVR (HCC) 07/27/2019   CAD (coronary artery disease) 07/27/2019   Chronic kidney disease, stage 3a (HCC) 07/27/2019    Nausea and vomiting 07/27/2019   Ventricular tachycardia (HCC) 07/27/2019   Chronic systolic CHF (congestive heart failure) (HCC) 07/08/2019   Ischemic cardiomyopathy 07/08/2019   Hypercholesterolemia    STEMI (ST elevation myocardial infarction) (HCC)    Asthma    Acute ST elevation myocardial infarction (STEMI) due to occlusion of left anterior descending (LAD) coronary artery (HCC) 06/29/2019   Acute systolic heart failure (HCC) 06/29/2019   DM II (diabetes mellitus, type II), controlled (HCC) 06/29/2019   Acute ST elevation myocardial infarction (STEMI) involving left anterior descending (LAD) coronary artery (HCC) 06/29/2019   Acute ST elevation myocardial infarction (STEMI) (HCC)    PCP:  Pcp, No Pharmacy:   [facility] [place] - [place], [place] - [address] [place] [address] Phone: [phone] Fax: [phone]  [id number] - [place], [place] - [address] AT [facility] OF [address] [place] [address] Phone: [phone] Fax: [phone]     Social Determinants of Health (SDOH) Social History: SDOH Screenings   Food Insecurity: No Food Insecurity (12/01/2022)  Housing: Low Risk  (12/01/2022)  Transportation Needs: No Transportation Needs (12/01/2022)  Utilities: Not At Risk (12/01/2022)  Tobacco Use: Medium Risk (12/01/2022)   SDOH Interventions:     Readmission Risk Interventions     No data to display

## 2022-12-02 NOTE — Progress Notes (Signed)
Progress Note   Patient: Terry Hanson JYN:[id number] DOB: 1940/04/10 DOA: 12/01/2022     0 DOS: the patient was seen and examined on 12/02/2022   Brief hospital admission narrative: As per H&P written by Dr. Thomes Dinning on 12/01/2022 Terry Hanson is a 82 y.o. female with medical history significant of chronic systolic CHF, atrial fibrillation, CAD s/p stent placement (DES to proximal LAD 06/2019), CKD 3 who presents to the emergency department from home via EMS due to syncope.  Patient's daughter at bedside states that she heard a large thud coming from patient's bedroom, she quickly went to check on patient and saw her on the floor with a gurgling sound, she tried to wake her up when she noted the patient stopped breathing, she quickly called EMS and was told to start CPR.  After fifth round of chest compressions, she heard a loud popping sound from her chest and patient woke up.  EMS team transported patient to the ED for further evaluation and management.  Patient was alert and oriented x 3 on arrival to the ED.  Patient complained of about 3-week onset of generalized weakness and an occasional nonproductive mild cough.  She denies chest pain, shortness of breath, fever, chills, nausea, vomiting.   ED Course:  In the emergency department, BP on arrival was 93/49, respiratory rate was 27/min, other vital signs were within normal range.  Workup in the ED showed hemoglobin of 6.4, hematocrit 20.5, MCV 100.5, platelets 102.  BMP showed potassium 2.2, chloride 123, bicarb 11, BUN 26, creatinine 1.13, calcium 4.1, GFR 49.  Magnesium 1.1, total CK21, FOBT was negative, troponin 7 > 32.  Influenza A, B, SARS coronavirus 2, RSV was negative.  Blood culture pending. CT head without contrast showed no acute intracranial abnormality CT cervical spine without contrast showed no fracture or acute finding CT abdomen and pelvis showed diverticulosis without diverticulitis. Chest x-ray showed no active disease IV hydration  was provided, calcium, magnesium and potassium were replenished.  2 units of packed red blood cells were ordered to be transfused in the ED.  Hospitalist was asked to admit patient for further evaluation and management.  Assessment and Plan: Syncope event -Concern for arrhythmias -Continue telemetry monitoring -Follow 2D echo results -Continue electrolyte repletion and follow trend/stability. -Case will be discussed with cardiology service. -Patient will benefit of cardiac monitoring at discharge.  Systolic congestive heart failure -Chronic and currently compensated -Continue to follow daily weights and strict I's and O's -Will continue treatment with metoprolol and digoxin -Holding Aldactone, Lasix and Entresto at the moment. -Follow echo results.  Hyperlipidemia -Continue statin  Class 2 obesity Body mass index is 37.94 kg/m.  -Control and increase physical activity discussed with patient.  Hypertension -Currently stable to low -Holding Entresto, Aldactone and Lasix -Follow vital signs.  Acute on chronic renal failure -Patient with stage IIIb at baseline -Creatinine higher than her baseline -Holding nephrotoxic agents temporarily -Maintaining adequate hydration -Will follow-up renal function trend.  Elevated troponin -Most likely demand ischemia in the setting of chest compression and worsening renal function -No acute ischemic changes on telemetry or EKG -Continue close monitoring.  History of atrial fibrillation -Appears to be paroxysmal in nature -Continue metoprolol and digoxin for rate control -Holding Eliquis with concerns for bright red blood per rectum/anemia -Continue telemetry monitoring.  Anemia/GI bleed -Unclear -Bright red blood per rectum appreciated while hospitalized and at time of admission low hemoglobin present -Status post 2 unit PRBCs transfused -Continue to follow hemoglobin trend -  GI service has been consulted to assist with any  endoscopic evaluation procedures. -Holding Eliquis at the moment -PPI twice a day started.  Macrocytosis/B12 deficiency -B12 level 153 -Repletion has been started -Continue supportive care and follow levels in the outpatient setting.   Subjective:  No shortness of breath, no fever, no nausea, no vomiting, no abdominal pain.  Bright red blood per rectum reported by nursing staff.  Patient expressing some chest soreness from chest compression.  Physical Exam: Vitals:   12/02/22 0812 12/02/22 0900 12/02/22 1000 12/02/22 1129  BP:  (!) 116/45 (!) 128/45 (!) 138/59  Pulse:  79 74 70  Resp:  20 (!) 21 (!) 22  Temp: 99.2 F (37.3 C)   (!) 97.5 F (36.4 C)  TempSrc: Oral   Oral  SpO2:  99% 100% 100%  Weight:      Height:       General exam: Alert, awake, oriented x 3; reporting muscular chest discomfort in her chest from chest compression.  No shortness of breath, no nausea, no vomiting.  Bright red blood per rectum reported by nursing staff/PT. Respiratory system: Clear to auscultation. Respiratory effort normal.  Good saturation on room air.  No using accessory muscle. Cardiovascular system: Rate controlled, no rubs, no gallops, no JVD on exam.  Positive systolic murmur. Gastrointestinal system: Abdomen is obese, nondistended, soft and nontender. No organomegaly or masses felt. Normal bowel sounds heard. Central nervous system: Moving 4 limbs spontaneously.  No focal neurological deficits. Extremities: No cyanosis, clubbing or edema. Skin: No petechiae. Psychiatry: Judgement and insight appear normal. Mood & affect appropriate.   Data Reviewed: Comprehensive metabolic panel: Sodium 133, potassium 4.8, chloride 104, bicarb 21, BUN 48, creatinine 2.18, normal LFTs and GFR 22 CBC: WBC 7.5, hemoglobin 11.4 and platelet count 1 63K Magnesium: 2.9 Phosphorus: 4.1 B12: 153  Family Communication: No family at bedside.  Disposition: Status is: Observation The patient will require care  spanning > 2 midnights and should be moved to inpatient because: Continue to follow hemoglobin trend and furtheras needed.  Patient with bright red blood per rectum and a GI service has been involved.  Continue telemetry monitoring; hold Eliquis, start PPI twice a day.   Planned Discharge Destination: Home with Home Health   Time spent: 50 minutes  Author: Vassie Loll, MD 12/02/2022 11:58 AM  For on call review [email or web address].

## 2022-12-02 NOTE — Plan of Care (Signed)
  Problem: Acute Rehab PT Goals(only PT should resolve) Goal: Pt Will Go Supine/Side To Sit Outcome: Progressing Flowsheets (Taken 12/02/2022 1133) Pt will go Supine/Side to Sit: with contact guard assist Goal: Patient Will Transfer Sit To/From Stand Outcome: Progressing Flowsheets (Taken 12/02/2022 1133) Patient will transfer sit to/from stand: with contact guard assist Goal: Pt Will Transfer Bed To Chair/Chair To Bed Outcome: Progressing Flowsheets (Taken 12/02/2022 1133) Pt will Transfer Bed to Chair/Chair to Bed: with contact guard assist Goal: Pt Will Ambulate Outcome: Progressing Flowsheets (Taken 12/02/2022 1133) Pt will Ambulate:  50 feet  with rolling walker  with contact guard assist

## 2022-12-03 ENCOUNTER — Ambulatory Visit (INDEPENDENT_AMBULATORY_CARE_PROVIDER_SITE_OTHER): Payer: No Typology Code available for payment source

## 2022-12-03 ENCOUNTER — Encounter (HOSPITAL_COMMUNITY): Payer: Self-pay | Admitting: Internal Medicine

## 2022-12-03 DIAGNOSIS — I4819 Other persistent atrial fibrillation: Secondary | ICD-10-CM | POA: Diagnosis not present

## 2022-12-03 DIAGNOSIS — I255 Ischemic cardiomyopathy: Secondary | ICD-10-CM | POA: Diagnosis not present

## 2022-12-03 DIAGNOSIS — I959 Hypotension, unspecified: Secondary | ICD-10-CM | POA: Diagnosis not present

## 2022-12-03 DIAGNOSIS — E876 Hypokalemia: Secondary | ICD-10-CM | POA: Diagnosis not present

## 2022-12-03 DIAGNOSIS — R55 Syncope and collapse: Secondary | ICD-10-CM | POA: Diagnosis not present

## 2022-12-03 DIAGNOSIS — D539 Nutritional anemia, unspecified: Secondary | ICD-10-CM

## 2022-12-03 DIAGNOSIS — I2510 Atherosclerotic heart disease of native coronary artery without angina pectoris: Secondary | ICD-10-CM | POA: Diagnosis not present

## 2022-12-03 DIAGNOSIS — I5022 Chronic systolic (congestive) heart failure: Secondary | ICD-10-CM | POA: Diagnosis not present

## 2022-12-03 DIAGNOSIS — N183 Chronic kidney disease, stage 3 unspecified: Secondary | ICD-10-CM | POA: Diagnosis not present

## 2022-12-03 DIAGNOSIS — I4901 Ventricular fibrillation: Secondary | ICD-10-CM | POA: Diagnosis not present

## 2022-12-03 DIAGNOSIS — I4729 Other ventricular tachycardia: Secondary | ICD-10-CM | POA: Diagnosis not present

## 2022-12-03 DIAGNOSIS — Z95810 Presence of automatic (implantable) cardiac defibrillator: Secondary | ICD-10-CM

## 2022-12-03 DIAGNOSIS — E669 Obesity, unspecified: Secondary | ICD-10-CM | POA: Diagnosis not present

## 2022-12-03 DIAGNOSIS — D649 Anemia, unspecified: Secondary | ICD-10-CM | POA: Diagnosis not present

## 2022-12-03 LAB — BLOOD CULTURE ID PANEL (REFLEXED) - BCID2

## 2022-12-03 LAB — CBC
HCT: 37.2 % (ref 36.0–46.0)
Hemoglobin: 11.9 g/dL — ABNORMAL LOW (ref 12.0–15.0)
MCH: 31.5 pg (ref 26.0–34.0)
MCHC: 32 g/dL (ref 30.0–36.0)
MCV: 98.4 fL (ref 80.0–100.0)
Platelets: 143 10*3/uL — ABNORMAL LOW (ref 150–400)
RBC: 3.78 MIL/uL — ABNORMAL LOW (ref 3.87–5.11)
RDW: 14.5 % (ref 11.5–15.5)
WBC: 10.4 10*3/uL (ref 4.0–10.5)
nRBC: 0 % (ref 0.0–0.2)

## 2022-12-03 LAB — BASIC METABOLIC PANEL
Anion gap: 9 (ref 5–15)
BUN: 41 mg/dL — ABNORMAL HIGH (ref 8–23)
CO2: 20 mmol/L — ABNORMAL LOW (ref 22–32)
Calcium: 8.7 mg/dL — ABNORMAL LOW (ref 8.9–10.3)
Chloride: 106 mmol/L (ref 98–111)
Creatinine, Ser: 2.12 mg/dL — ABNORMAL HIGH (ref 0.44–1.00)
GFR, Estimated: 23 mL/min — ABNORMAL LOW (ref >60)
Glucose, Bld: 148 mg/dL — ABNORMAL HIGH (ref 70–99)
Potassium: 4.6 mmol/L (ref 3.5–5.1)
Sodium: 135 mmol/L (ref 135–145)

## 2022-12-03 LAB — MAGNESIUM: Magnesium: 2.6 mg/dL — ABNORMAL HIGH (ref 1.7–2.4)

## 2022-12-03 MED ORDER — PEG 3350-KCL-NA BICARB-NACL 420 G PO SOLR
4000.0000 mL | Freq: Once | ORAL | Status: AC
  Administered 2022-12-03: 4000 mL via ORAL

## 2022-12-03 MED ORDER — VANCOMYCIN VARIABLE DOSE PER UNSTABLE RENAL FUNCTION (PHARMACIST DOSING): Status: DC

## 2022-12-03 MED ORDER — VANCOMYCIN HCL 2000 MG/400ML IV SOLN
2000.0000 mg | Freq: Once | INTRAVENOUS | Status: AC
  Administered 2022-12-03: 2000 mg via INTRAVENOUS
  Filled 2022-12-03 (×2): qty 400

## 2022-12-03 MED ORDER — BISACODYL 5 MG PO TBEC
10.0000 mg | DELAYED_RELEASE_TABLET | Freq: Once | ORAL | Status: AC
  Administered 2022-12-03: 10 mg via ORAL
  Filled 2022-12-03: qty 2

## 2022-12-03 MED ORDER — CEFAZOLIN SODIUM-DEXTROSE 2-4 GM/100ML-% IV SOLN: 2.0000 g | Freq: Two times a day (BID) | INTRAVENOUS | Status: DC

## 2022-12-03 MED ORDER — NYSTATIN 100000 UNIT/GM EX POWD
Freq: Three times a day (TID) | CUTANEOUS | Status: DC
  Administered 2022-12-04: 1 via TOPICAL
  Filled 2022-12-03 (×4): qty 15

## 2022-12-03 MED ORDER — SODIUM CHLORIDE 0.9 % IV BOLUS
500.0000 mL | Freq: Once | INTRAVENOUS | Status: AC
  Administered 2022-12-03: 500 mL via INTRAVENOUS

## 2022-12-03 MED ORDER — VANCOMYCIN HCL 750 MG/150ML IV SOLN
750.0000 mg | INTRAVENOUS | Status: DC
  Filled 2022-12-03: qty 150

## 2022-12-03 NOTE — Consult Note (Addendum)
reviewed. Normal device function.  8 logged [facility] detections since last clinic visit, 5 device-classified SVT, all occurring on 11/01/22.  EGMs consistent with AF with RVR based on irregular R-R and morphology discrimination. Previously routed to triage, metoprolol dose increased.  Known history of AF, on Eliquis.  History of RVR episodes with inappropriate therapy, programming changes made in clinic, on digoxin and metoprolol per Epic.  Within the monitoring period, HF diagnostics have  been abnormal.  Histogram shows predominant HR between 100-110 bpm. Next remote 91 days. - CS, CVRS  ECHOCARDIOGRAM COMPLETE  Result Date: 12/02/2022    ECHOCARDIOGRAM REPORT   Patient Name:   Terry Hanson Date of Exam: 12/02/2022 Medical Rec #:  [id number]   Height:       62.0 in Accession #:    [id number]  Weight:       207.5 lb Date of Birth:  1940/04/22    BSA:           1.942 m Patient Age:    82 years    BP:           128/36 mmHg Patient Gender: F           HR:           77 bpm. [facility] Procedure: 2D Echo, Cardiac Doppler, Color Doppler and Intracardiac            Opacification Agent                                MODIFIED REPORT:    This report was modified by Thomasene Ripple DO on 12/02/2022 due to diastolic                                  dysfunction.  Indications:     Syncope  History:         Patient has prior history of Echocardiogram examinations, most                  recent 05/24/2022. Cardiomyopathy, Pacemaker; Risk                  Factors:Diabetes.  Sonographer:     Darlys Gales Referring Phys:  [id number] OLADAPO ADEFESO Diagnosing Phys: Thomasene Ripple DO  Sonographer Comments: Technically difficult study due to poor echo windows. Image acquisition challenging due to patient body habitus. IMPRESSIONS  1. Left ventricular ejection fraction, by estimation, is 20 to 25%. The left ventricle has severely decreased function. The left ventricle demonstrates regional wall motion abnormalities (see scoring diagram/findings for description). Left ventricular diastolic parameters are consistent with Grade III diastolic dysfunction (restrictive). Elevated left atrial pressure.  2. Right ventricular systolic function is normal. The right ventricular size is normal.  3. The mitral valve is normal in structure. No evidence of mitral valve regurgitation. No evidence of mitral stenosis.  4. The aortic valve is normal in structure. Aortic valve regurgitation is not visualized. Mild to moderate aortic valve stenosis. Aortic valve area, by VTI measures 1.13 cm. Aortic valve mean gradient measures 8.0 mmHg. Aortic valve Vmax measures 1.76 m/s.  5. The inferior vena cava is normal in size with greater than 50% respiratory variability, suggesting right atrial pressure of 3 mmHg. FINDINGS  Left Ventricle: Left ventricular ejection fraction, by estimation, is 20 to 25%. The left  ventricle has  reviewed. Normal device function.  8 logged [facility] detections since last clinic visit, 5 device-classified SVT, all occurring on 11/01/22.  EGMs consistent with AF with RVR based on irregular R-R and morphology discrimination. Previously routed to triage, metoprolol dose increased.  Known history of AF, on Eliquis.  History of RVR episodes with inappropriate therapy, programming changes made in clinic, on digoxin and metoprolol per Epic.  Within the monitoring period, HF diagnostics have  been abnormal.  Histogram shows predominant HR between 100-110 bpm. Next remote 91 days. - CS, CVRS  ECHOCARDIOGRAM COMPLETE  Result Date: 12/02/2022    ECHOCARDIOGRAM REPORT   Patient Name:   Terry Hanson Date of Exam: 12/02/2022 Medical Rec #:  [id number]   Height:       62.0 in Accession #:    [id number]  Weight:       207.5 lb Date of Birth:  1940/04/22    BSA:           1.942 m Patient Age:    82 years    BP:           128/36 mmHg Patient Gender: F           HR:           77 bpm. [facility] Procedure: 2D Echo, Cardiac Doppler, Color Doppler and Intracardiac            Opacification Agent                                MODIFIED REPORT:    This report was modified by Thomasene Ripple DO on 12/02/2022 due to diastolic                                  dysfunction.  Indications:     Syncope  History:         Patient has prior history of Echocardiogram examinations, most                  recent 05/24/2022. Cardiomyopathy, Pacemaker; Risk                  Factors:Diabetes.  Sonographer:     Darlys Gales Referring Phys:  [id number] OLADAPO ADEFESO Diagnosing Phys: Thomasene Ripple DO  Sonographer Comments: Technically difficult study due to poor echo windows. Image acquisition challenging due to patient body habitus. IMPRESSIONS  1. Left ventricular ejection fraction, by estimation, is 20 to 25%. The left ventricle has severely decreased function. The left ventricle demonstrates regional wall motion abnormalities (see scoring diagram/findings for description). Left ventricular diastolic parameters are consistent with Grade III diastolic dysfunction (restrictive). Elevated left atrial pressure.  2. Right ventricular systolic function is normal. The right ventricular size is normal.  3. The mitral valve is normal in structure. No evidence of mitral valve regurgitation. No evidence of mitral stenosis.  4. The aortic valve is normal in structure. Aortic valve regurgitation is not visualized. Mild to moderate aortic valve stenosis. Aortic valve area, by VTI measures 1.13 cm. Aortic valve mean gradient measures 8.0 mmHg. Aortic valve Vmax measures 1.76 m/s.  5. The inferior vena cava is normal in size with greater than 50% respiratory variability, suggesting right atrial pressure of 3 mmHg. FINDINGS  Left Ventricle: Left ventricular ejection fraction, by estimation, is 20 to 25%. The left  ventricle has  Cardiology Consultation   Patient ID: Terry Hanson MRN: [id number]; DOB: 25-Aug-1940  Admit date: 12/01/2022 Date of Consult: 12/03/2022  PCP:  Oneita Hurt, No   [facility] Providers Cardiologist:  Marjo Bicker, MD  Electrophysiologist:  Lewayne Bunting, MD       Patient Profile:   Terry Hanson is a 82 y.o. female with a hx of CAD with anterior STEMI 06/2019 s/p DES to prox LAD, ICM/chronic HFrEF, St. 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 Gwenlyn Perking.  History of Present Illness:   Ms. Sarpy  had prior MI in 06/2019 with PCI as above. Cath otherwise showed nonobstructive disease with 20% pLM, small 70% prox OM1, 60% mid-distal Cx, 40-50% prox RCA, 70% mRCA, managed medically. LVEF remained low from that admission on so underwent ICD implantation in 09/2019. She later developed Afib and underwent ablation in 03/2021. She was seen by Dr. Ladona Ridgel in 09/2022 who noted an ICD shock for AF RVR and recommended uptitration of Toprol. He reported he would consider dofetilide if she had more rapid afib. She presented this admission with syncope. Her daughter heard a thud from the other room. Her mother was making a gurgling sound. When daughter tried to wake her, she stopped breathing. Daughter called EMS and initiated CPR. After 5 chest compressions, there was a popping sound from her chest and the patient woke up. She was hypotensive to 70/40 per EMS. In ED she was 68/48. She was found to have profound lab abnormalities with Hgb down to 6.4, hypokalemia of 2.2, hypocalcemia of 4.1 Mg 1.1 with Cr of 1.13. She received urgent blood transfusion, crystalloid boluses, electrolyte replacement. Interestingly subsequent labs overnight then showed hyperkalemia of 6.2 and AKI with Cr 2.43 with repeat Hgb 11.4.  She has had some SOB but no chest pain. hsTroponin 32-85-80.  She initially had denied any signs of bleeding but subsequently had fresh blood in her stool this morning. Cardiology asked to see for syncopal spell and pre-procedure clearance for possible GI procedure. Echo done showed known EF 20-25%, G3DD, mild-moderate AS. 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  Interrogation is pending.   Past Medical History:  Diagnosis Date   Arthritis    Asthma    Chronic HFrEF (heart failure with reduced ejection fraction) (HCC)    Chronic kidney disease, stage 3b (HCC)    Diabetes mellitus (HCC)    Hypercholesterolemia    Hyperlipidemia    Ischemic cardiomyopathy    STEMI (ST elevation myocardial infarction) (HCC)    a. 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    Past Surgical History:  Procedure Laterality Date   ABDOMINAL HYSTERECTOMY     ATRIAL FIBRILLATION ABLATION N/A 04/05/2021   Procedure: ATRIAL FIBRILLATION ABLATION;  Surgeon: Regan Lemming, MD;  Location: [facility];  Service: Cardiovascular;  Laterality: N/A;   CATARACT EXTRACTION W/PHACO Right 06/27/2020   Procedure: CATARACT EXTRACTION PHACO AND INTRAOCULAR LENS PLACEMENT RIGHT EYE AND PLACEMENT OF CORTICOSTEROID;  Surgeon: Fabio Pierce, MD;  Location: [facility];  Service: Ophthalmology;  Laterality: Right;  right CDE=25.47   CORONARY/GRAFT ACUTE MI  reviewed. Normal device function.  8 logged [facility] detections since last clinic visit, 5 device-classified SVT, all occurring on 11/01/22.  EGMs consistent with AF with RVR based on irregular R-R and morphology discrimination. Previously routed to triage, metoprolol dose increased.  Known history of AF, on Eliquis.  History of RVR episodes with inappropriate therapy, programming changes made in clinic, on digoxin and metoprolol per Epic.  Within the monitoring period, HF diagnostics have  been abnormal.  Histogram shows predominant HR between 100-110 bpm. Next remote 91 days. - CS, CVRS  ECHOCARDIOGRAM COMPLETE  Result Date: 12/02/2022    ECHOCARDIOGRAM REPORT   Patient Name:   Terry Hanson Date of Exam: 12/02/2022 Medical Rec #:  [id number]   Height:       62.0 in Accession #:    [id number]  Weight:       207.5 lb Date of Birth:  1940/04/22    BSA:           1.942 m Patient Age:    82 years    BP:           128/36 mmHg Patient Gender: F           HR:           77 bpm. [facility] Procedure: 2D Echo, Cardiac Doppler, Color Doppler and Intracardiac            Opacification Agent                                MODIFIED REPORT:    This report was modified by Thomasene Ripple DO on 12/02/2022 due to diastolic                                  dysfunction.  Indications:     Syncope  History:         Patient has prior history of Echocardiogram examinations, most                  recent 05/24/2022. Cardiomyopathy, Pacemaker; Risk                  Factors:Diabetes.  Sonographer:     Darlys Gales Referring Phys:  [id number] OLADAPO ADEFESO Diagnosing Phys: Thomasene Ripple DO  Sonographer Comments: Technically difficult study due to poor echo windows. Image acquisition challenging due to patient body habitus. IMPRESSIONS  1. Left ventricular ejection fraction, by estimation, is 20 to 25%. The left ventricle has severely decreased function. The left ventricle demonstrates regional wall motion abnormalities (see scoring diagram/findings for description). Left ventricular diastolic parameters are consistent with Grade III diastolic dysfunction (restrictive). Elevated left atrial pressure.  2. Right ventricular systolic function is normal. The right ventricular size is normal.  3. The mitral valve is normal in structure. No evidence of mitral valve regurgitation. No evidence of mitral stenosis.  4. The aortic valve is normal in structure. Aortic valve regurgitation is not visualized. Mild to moderate aortic valve stenosis. Aortic valve area, by VTI measures 1.13 cm. Aortic valve mean gradient measures 8.0 mmHg. Aortic valve Vmax measures 1.76 m/s.  5. The inferior vena cava is normal in size with greater than 50% respiratory variability, suggesting right atrial pressure of 3 mmHg. FINDINGS  Left Ventricle: Left ventricular ejection fraction, by estimation, is 20 to 25%. The left  ventricle has  Cardiology Consultation   Patient ID: Terry Hanson MRN: [id number]; DOB: 25-Aug-1940  Admit date: 12/01/2022 Date of Consult: 12/03/2022  PCP:  Oneita Hurt, No   [facility] Providers Cardiologist:  Marjo Bicker, MD  Electrophysiologist:  Lewayne Bunting, MD       Patient Profile:   Terry Hanson is a 82 y.o. female with a hx of CAD with anterior STEMI 06/2019 s/p DES to prox LAD, ICM/chronic HFrEF, St. 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 Gwenlyn Perking.  History of Present Illness:   Ms. Sarpy  had prior MI in 06/2019 with PCI as above. Cath otherwise showed nonobstructive disease with 20% pLM, small 70% prox OM1, 60% mid-distal Cx, 40-50% prox RCA, 70% mRCA, managed medically. LVEF remained low from that admission on so underwent ICD implantation in 09/2019. She later developed Afib and underwent ablation in 03/2021. She was seen by Dr. Ladona Ridgel in 09/2022 who noted an ICD shock for AF RVR and recommended uptitration of Toprol. He reported he would consider dofetilide if she had more rapid afib. She presented this admission with syncope. Her daughter heard a thud from the other room. Her mother was making a gurgling sound. When daughter tried to wake her, she stopped breathing. Daughter called EMS and initiated CPR. After 5 chest compressions, there was a popping sound from her chest and the patient woke up. She was hypotensive to 70/40 per EMS. In ED she was 68/48. She was found to have profound lab abnormalities with Hgb down to 6.4, hypokalemia of 2.2, hypocalcemia of 4.1 Mg 1.1 with Cr of 1.13. She received urgent blood transfusion, crystalloid boluses, electrolyte replacement. Interestingly subsequent labs overnight then showed hyperkalemia of 6.2 and AKI with Cr 2.43 with repeat Hgb 11.4.  She has had some SOB but no chest pain. hsTroponin 32-85-80.  She initially had denied any signs of bleeding but subsequently had fresh blood in her stool this morning. Cardiology asked to see for syncopal spell and pre-procedure clearance for possible GI procedure. Echo done showed known EF 20-25%, G3DD, mild-moderate AS. 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  Interrogation is pending.   Past Medical History:  Diagnosis Date   Arthritis    Asthma    Chronic HFrEF (heart failure with reduced ejection fraction) (HCC)    Chronic kidney disease, stage 3b (HCC)    Diabetes mellitus (HCC)    Hypercholesterolemia    Hyperlipidemia    Ischemic cardiomyopathy    STEMI (ST elevation myocardial infarction) (HCC)    a. 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    Past Surgical History:  Procedure Laterality Date   ABDOMINAL HYSTERECTOMY     ATRIAL FIBRILLATION ABLATION N/A 04/05/2021   Procedure: ATRIAL FIBRILLATION ABLATION;  Surgeon: Regan Lemming, MD;  Location: [facility];  Service: Cardiovascular;  Laterality: N/A;   CATARACT EXTRACTION W/PHACO Right 06/27/2020   Procedure: CATARACT EXTRACTION PHACO AND INTRAOCULAR LENS PLACEMENT RIGHT EYE AND PLACEMENT OF CORTICOSTEROID;  Surgeon: Fabio Pierce, MD;  Location: [facility];  Service: Ophthalmology;  Laterality: Right;  right CDE=25.47   CORONARY/GRAFT ACUTE MI  Cardiology Consultation   Patient ID: Terry Hanson MRN: [id number]; DOB: 25-Aug-1940  Admit date: 12/01/2022 Date of Consult: 12/03/2022  PCP:  Oneita Hurt, No   [facility] Providers Cardiologist:  Marjo Bicker, MD  Electrophysiologist:  Lewayne Bunting, MD       Patient Profile:   Terry Hanson is a 82 y.o. female with a hx of CAD with anterior STEMI 06/2019 s/p DES to prox LAD, ICM/chronic HFrEF, St. 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 Gwenlyn Perking.  History of Present Illness:   Ms. Sarpy  had prior MI in 06/2019 with PCI as above. Cath otherwise showed nonobstructive disease with 20% pLM, small 70% prox OM1, 60% mid-distal Cx, 40-50% prox RCA, 70% mRCA, managed medically. LVEF remained low from that admission on so underwent ICD implantation in 09/2019. She later developed Afib and underwent ablation in 03/2021. She was seen by Dr. Ladona Ridgel in 09/2022 who noted an ICD shock for AF RVR and recommended uptitration of Toprol. He reported he would consider dofetilide if she had more rapid afib. She presented this admission with syncope. Her daughter heard a thud from the other room. Her mother was making a gurgling sound. When daughter tried to wake her, she stopped breathing. Daughter called EMS and initiated CPR. After 5 chest compressions, there was a popping sound from her chest and the patient woke up. She was hypotensive to 70/40 per EMS. In ED she was 68/48. She was found to have profound lab abnormalities with Hgb down to 6.4, hypokalemia of 2.2, hypocalcemia of 4.1 Mg 1.1 with Cr of 1.13. She received urgent blood transfusion, crystalloid boluses, electrolyte replacement. Interestingly subsequent labs overnight then showed hyperkalemia of 6.2 and AKI with Cr 2.43 with repeat Hgb 11.4.  She has had some SOB but no chest pain. hsTroponin 32-85-80.  She initially had denied any signs of bleeding but subsequently had fresh blood in her stool this morning. Cardiology asked to see for syncopal spell and pre-procedure clearance for possible GI procedure. Echo done showed known EF 20-25%, G3DD, mild-moderate AS. 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  Interrogation is pending.   Past Medical History:  Diagnosis Date   Arthritis    Asthma    Chronic HFrEF (heart failure with reduced ejection fraction) (HCC)    Chronic kidney disease, stage 3b (HCC)    Diabetes mellitus (HCC)    Hypercholesterolemia    Hyperlipidemia    Ischemic cardiomyopathy    STEMI (ST elevation myocardial infarction) (HCC)    a. 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    Past Surgical History:  Procedure Laterality Date   ABDOMINAL HYSTERECTOMY     ATRIAL FIBRILLATION ABLATION N/A 04/05/2021   Procedure: ATRIAL FIBRILLATION ABLATION;  Surgeon: Regan Lemming, MD;  Location: [facility];  Service: Cardiovascular;  Laterality: N/A;   CATARACT EXTRACTION W/PHACO Right 06/27/2020   Procedure: CATARACT EXTRACTION PHACO AND INTRAOCULAR LENS PLACEMENT RIGHT EYE AND PLACEMENT OF CORTICOSTEROID;  Surgeon: Fabio Pierce, MD;  Location: [facility];  Service: Ophthalmology;  Laterality: Right;  right CDE=25.47   CORONARY/GRAFT ACUTE MI  Cardiology Consultation   Patient ID: Terry Hanson MRN: [id number]; DOB: 25-Aug-1940  Admit date: 12/01/2022 Date of Consult: 12/03/2022  PCP:  Oneita Hurt, No   [facility] Providers Cardiologist:  Marjo Bicker, MD  Electrophysiologist:  Lewayne Bunting, MD       Patient Profile:   Terry Hanson is a 82 y.o. female with a hx of CAD with anterior STEMI 06/2019 s/p DES to prox LAD, ICM/chronic HFrEF, St. 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 Gwenlyn Perking.  History of Present Illness:   Ms. Sarpy  had prior MI in 06/2019 with PCI as above. Cath otherwise showed nonobstructive disease with 20% pLM, small 70% prox OM1, 60% mid-distal Cx, 40-50% prox RCA, 70% mRCA, managed medically. LVEF remained low from that admission on so underwent ICD implantation in 09/2019. She later developed Afib and underwent ablation in 03/2021. She was seen by Dr. Ladona Ridgel in 09/2022 who noted an ICD shock for AF RVR and recommended uptitration of Toprol. He reported he would consider dofetilide if she had more rapid afib. She presented this admission with syncope. Her daughter heard a thud from the other room. Her mother was making a gurgling sound. When daughter tried to wake her, she stopped breathing. Daughter called EMS and initiated CPR. After 5 chest compressions, there was a popping sound from her chest and the patient woke up. She was hypotensive to 70/40 per EMS. In ED she was 68/48. She was found to have profound lab abnormalities with Hgb down to 6.4, hypokalemia of 2.2, hypocalcemia of 4.1 Mg 1.1 with Cr of 1.13. She received urgent blood transfusion, crystalloid boluses, electrolyte replacement. Interestingly subsequent labs overnight then showed hyperkalemia of 6.2 and AKI with Cr 2.43 with repeat Hgb 11.4.  She has had some SOB but no chest pain. hsTroponin 32-85-80.  She initially had denied any signs of bleeding but subsequently had fresh blood in her stool this morning. Cardiology asked to see for syncopal spell and pre-procedure clearance for possible GI procedure. Echo done showed known EF 20-25%, G3DD, mild-moderate AS. 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  Interrogation is pending.   Past Medical History:  Diagnosis Date   Arthritis    Asthma    Chronic HFrEF (heart failure with reduced ejection fraction) (HCC)    Chronic kidney disease, stage 3b (HCC)    Diabetes mellitus (HCC)    Hypercholesterolemia    Hyperlipidemia    Ischemic cardiomyopathy    STEMI (ST elevation myocardial infarction) (HCC)    a. 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    Past Surgical History:  Procedure Laterality Date   ABDOMINAL HYSTERECTOMY     ATRIAL FIBRILLATION ABLATION N/A 04/05/2021   Procedure: ATRIAL FIBRILLATION ABLATION;  Surgeon: Regan Lemming, MD;  Location: [facility];  Service: Cardiovascular;  Laterality: N/A;   CATARACT EXTRACTION W/PHACO Right 06/27/2020   Procedure: CATARACT EXTRACTION PHACO AND INTRAOCULAR LENS PLACEMENT RIGHT EYE AND PLACEMENT OF CORTICOSTEROID;  Surgeon: Fabio Pierce, MD;  Location: [facility];  Service: Ophthalmology;  Laterality: Right;  right CDE=25.47   CORONARY/GRAFT ACUTE MI  Cardiology Consultation   Patient ID: Terry Hanson MRN: [id number]; DOB: 25-Aug-1940  Admit date: 12/01/2022 Date of Consult: 12/03/2022  PCP:  Oneita Hurt, No   [facility] Providers Cardiologist:  Marjo Bicker, MD  Electrophysiologist:  Lewayne Bunting, MD       Patient Profile:   Terry Hanson is a 82 y.o. female with a hx of CAD with anterior STEMI 06/2019 s/p DES to prox LAD, ICM/chronic HFrEF, St. 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 Gwenlyn Perking.  History of Present Illness:   Ms. Sarpy  had prior MI in 06/2019 with PCI as above. Cath otherwise showed nonobstructive disease with 20% pLM, small 70% prox OM1, 60% mid-distal Cx, 40-50% prox RCA, 70% mRCA, managed medically. LVEF remained low from that admission on so underwent ICD implantation in 09/2019. She later developed Afib and underwent ablation in 03/2021. She was seen by Dr. Ladona Ridgel in 09/2022 who noted an ICD shock for AF RVR and recommended uptitration of Toprol. He reported he would consider dofetilide if she had more rapid afib. She presented this admission with syncope. Her daughter heard a thud from the other room. Her mother was making a gurgling sound. When daughter tried to wake her, she stopped breathing. Daughter called EMS and initiated CPR. After 5 chest compressions, there was a popping sound from her chest and the patient woke up. She was hypotensive to 70/40 per EMS. In ED she was 68/48. She was found to have profound lab abnormalities with Hgb down to 6.4, hypokalemia of 2.2, hypocalcemia of 4.1 Mg 1.1 with Cr of 1.13. She received urgent blood transfusion, crystalloid boluses, electrolyte replacement. Interestingly subsequent labs overnight then showed hyperkalemia of 6.2 and AKI with Cr 2.43 with repeat Hgb 11.4.  She has had some SOB but no chest pain. hsTroponin 32-85-80.  She initially had denied any signs of bleeding but subsequently had fresh blood in her stool this morning. Cardiology asked to see for syncopal spell and pre-procedure clearance for possible GI procedure. Echo done showed known EF 20-25%, G3DD, mild-moderate AS. 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  Interrogation is pending.   Past Medical History:  Diagnosis Date   Arthritis    Asthma    Chronic HFrEF (heart failure with reduced ejection fraction) (HCC)    Chronic kidney disease, stage 3b (HCC)    Diabetes mellitus (HCC)    Hypercholesterolemia    Hyperlipidemia    Ischemic cardiomyopathy    STEMI (ST elevation myocardial infarction) (HCC)    a. 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    Past Surgical History:  Procedure Laterality Date   ABDOMINAL HYSTERECTOMY     ATRIAL FIBRILLATION ABLATION N/A 04/05/2021   Procedure: ATRIAL FIBRILLATION ABLATION;  Surgeon: Regan Lemming, MD;  Location: [facility];  Service: Cardiovascular;  Laterality: N/A;   CATARACT EXTRACTION W/PHACO Right 06/27/2020   Procedure: CATARACT EXTRACTION PHACO AND INTRAOCULAR LENS PLACEMENT RIGHT EYE AND PLACEMENT OF CORTICOSTEROID;  Surgeon: Fabio Pierce, MD;  Location: [facility];  Service: Ophthalmology;  Laterality: Right;  right CDE=25.47   CORONARY/GRAFT ACUTE MI  Cardiology Consultation   Patient ID: Terry Hanson MRN: [id number]; DOB: 25-Aug-1940  Admit date: 12/01/2022 Date of Consult: 12/03/2022  PCP:  Oneita Hurt, No   [facility] Providers Cardiologist:  Marjo Bicker, MD  Electrophysiologist:  Lewayne Bunting, MD       Patient Profile:   Terry Hanson is a 82 y.o. female with a hx of CAD with anterior STEMI 06/2019 s/p DES to prox LAD, ICM/chronic HFrEF, 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 Gwenlyn Perking.  History of Present Illness:   Ms. Sarpy  had prior MI in 06/2019 with PCI as above. Cath otherwise showed nonobstructive disease with 20% pLM, small 70% prox OM1, 60% mid-distal Cx, 40-50% prox RCA, 70% mRCA, managed medically. LVEF remained low from that admission on so underwent ICD implantation in 09/2019. She later developed Afib and underwent ablation in 03/2021. She was seen by Dr. Ladona Ridgel in 09/2022 who noted an ICD shock for AF RVR and recommended uptitration of Toprol. He reported he would consider dofetilide if she had more rapid afib. She presented this admission with syncope. Her daughter heard a thud from the other room. Her mother was making a gurgling sound. When daughter tried to wake her, she stopped breathing. Daughter called EMS and initiated CPR. After 5 chest compressions, there was a popping sound from her chest and the patient woke up. She was hypotensive to 70/40 per EMS. In ED she was 68/48. She was found to have profound lab abnormalities with Hgb down to 6.4, hypokalemia of 2.2, hypocalcemia of 4.1 Mg 1.1 with Cr of 1.13. She received urgent blood transfusion, crystalloid boluses, electrolyte replacement. Interestingly subsequent labs overnight then showed hyperkalemia of 6.2 and AKI with Cr 2.43 with repeat Hgb 11.4.  She has had some SOB but no chest pain. hsTroponin 32-85-80.  She initially had denied any signs of bleeding but subsequently had fresh blood in her stool this morning. Cardiology asked to see for syncopal spell and pre-procedure clearance for possible GI procedure. Echo done showed known EF 20-25%, G3DD, mild-moderate AS. 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  Interrogation is pending.   Past Medical History:  Diagnosis Date   Arthritis    Asthma    Chronic HFrEF (heart failure with reduced ejection fraction) (HCC)    Chronic kidney disease, stage 3b (HCC)    Diabetes mellitus (HCC)    Hypercholesterolemia    Hyperlipidemia    Ischemic cardiomyopathy    STEMI (ST elevation myocardial infarction) (HCC)    a. 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    Past Surgical History:  Procedure Laterality Date   ABDOMINAL HYSTERECTOMY     ATRIAL FIBRILLATION ABLATION N/A 04/05/2021   Procedure: ATRIAL FIBRILLATION ABLATION;  Surgeon: Regan Lemming, MD;  Location: [facility];  Service: Cardiovascular;  Laterality: N/A;   CATARACT EXTRACTION W/PHACO Right 06/27/2020   Procedure: CATARACT EXTRACTION PHACO AND INTRAOCULAR LENS PLACEMENT RIGHT EYE AND PLACEMENT OF CORTICOSTEROID;  Surgeon: Fabio Pierce, MD;  Location: [facility];  Service: Ophthalmology;  Laterality: Right;  right CDE=25.47   CORONARY/GRAFT ACUTE MI  reviewed. Normal device function.  8 logged [facility] detections since last clinic visit, 5 device-classified SVT, all occurring on 11/01/22.  EGMs consistent with AF with RVR based on irregular R-R and morphology discrimination. Previously routed to triage, metoprolol dose increased.  Known history of AF, on Eliquis.  History of RVR episodes with inappropriate therapy, programming changes made in clinic, on digoxin and metoprolol per Epic.  Within the monitoring period, HF diagnostics have  been abnormal.  Histogram shows predominant HR between 100-110 bpm. Next remote 91 days. - CS, CVRS  ECHOCARDIOGRAM COMPLETE  Result Date: 12/02/2022    ECHOCARDIOGRAM REPORT   Patient Name:   Terry Hanson Date of Exam: 12/02/2022 Medical Rec #:  [id number]   Height:       62.0 in Accession #:    [id number]  Weight:       207.5 lb Date of Birth:  1940/04/22    BSA:           1.942 m Patient Age:    82 years    BP:           128/36 mmHg Patient Gender: F           HR:           77 bpm. [facility] Procedure: 2D Echo, Cardiac Doppler, Color Doppler and Intracardiac            Opacification Agent                                MODIFIED REPORT:    This report was modified by Thomasene Ripple DO on 12/02/2022 due to diastolic                                  dysfunction.  Indications:     Syncope  History:         Patient has prior history of Echocardiogram examinations, most                  recent 05/24/2022. Cardiomyopathy, Pacemaker; Risk                  Factors:Diabetes.  Sonographer:     Darlys Gales Referring Phys:  [id number] OLADAPO ADEFESO Diagnosing Phys: Thomasene Ripple DO  Sonographer Comments: Technically difficult study due to poor echo windows. Image acquisition challenging due to patient body habitus. IMPRESSIONS  1. Left ventricular ejection fraction, by estimation, is 20 to 25%. The left ventricle has severely decreased function. The left ventricle demonstrates regional wall motion abnormalities (see scoring diagram/findings for description). Left ventricular diastolic parameters are consistent with Grade III diastolic dysfunction (restrictive). Elevated left atrial pressure.  2. Right ventricular systolic function is normal. The right ventricular size is normal.  3. The mitral valve is normal in structure. No evidence of mitral valve regurgitation. No evidence of mitral stenosis.  4. The aortic valve is normal in structure. Aortic valve regurgitation is not visualized. Mild to moderate aortic valve stenosis. Aortic valve area, by VTI measures 1.13 cm. Aortic valve mean gradient measures 8.0 mmHg. Aortic valve Vmax measures 1.76 m/s.  5. The inferior vena cava is normal in size with greater than 50% respiratory variability, suggesting right atrial pressure of 3 mmHg. FINDINGS  Left Ventricle: Left ventricular ejection fraction, by estimation, is 20 to 25%. The left  ventricle has

## 2022-12-03 NOTE — Progress Notes (Signed)
Mobility Specialist Progress Note:    12/03/22 1350  Mobility  Activity Ambulated with assistance in hallway  Level of Assistance Standby assist, set-up cues, supervision of patient - no hands on  Assistive Device Front wheel walker  Distance Ambulated (ft) 150 ft  Range of Motion/Exercises Active;All extremities  Activity Response Tolerated well  Mobility Referral Yes  $Mobility charge 1 Mobility  Mobility Specialist Start Time (ACUTE ONLY) 1350  Mobility Specialist Stop Time (ACUTE ONLY) 1400  Mobility Specialist Time Calculation (min) (ACUTE ONLY) 10 min   Pt received in chair, visitor in room. Agreeable to mobility, required SBA to stand and ambulate with RW. Tolerated well, audible SOB at EOS. SpO2 97% on RA. Left pt in chair, all needs met.   Lawerance Bach Mobility Specialist Please contact via Special educational needs teacher or  Rehab office at [phone]

## 2022-12-03 NOTE — Progress Notes (Signed)
Pharmacy Antibiotic Note  Terry Hanson is a 82 y.o. female admitted on 12/01/2022 with  syncope .  Blood cx collected on admission now growing GPC; coag negative staph species identified on BCID. Pharmacy has been consulted for Vancomycin dosing.  She is afebrile, WBC WNL, LA WNL on admission.  AKF noted- Scr 1.13 on admission  Plan: Vancomycin 2gm IV x1 now Check random Vancomycin level in ~48h (unless renal function improves Consider repeat blood cx Monitor renal function and cx data    Height: 5\' 2"  (157.5 cm) Weight: 94.1 kg (207 lb 7.3 oz) IBW/kg (Calculated) : 50.1  Temp (24hrs), Avg:98.5 F (36.9 C), Min:97.5 F (36.4 C), Max:99.2 F (37.3 C)  Recent Labs  Lab 12/01/22 1630 12/01/22 1632 12/01/22 2215 12/02/22 0012 12/02/22 0417  WBC 4.7  --   --   --  7.5  CREATININE 1.13*  --  2.43* 2.31* 2.18*  LATICACIDVEN  --  1.2  --   --   --     Estimated Creatinine Clearance: 21.6 mL/min (A) (by C-G formula based on SCr of 2.18 mg/dL (H)).    Allergies  Allergen Reactions   Iodinated Contrast Media Shortness Of Breath    Per Patient- The last time she had contrast during a scan she had difficulty breathing   Codeine Hives   Shrimp [Shellfish Allergy] Other (See Comments)    Broke out   Amiodarone Nausea And Vomiting   Penicillins Itching and Rash    Did it involve swelling of the face/tongue/throat, SOB, or low BP? Yes Did it involve sudden or severe rash/hives, skin peeling, or any reaction on the inside of your mouth or nose? Yes Did you need to seek medical attention at a hospital or doctor's office? Yes When did it last happen? Over 10 years       If all above answers are "NO", may proceed with cephalosporin use.    Sulfa Antibiotics Itching    Antimicrobials this admission: 9/16 Vancomycin >>  Dose adjustments this admission:  Microbiology results: 9/14 BCx: 2/4 GPC (BCID + Staph species) 9/14 Resp PCR: negative  Thank you for allowing pharmacy to be  a part of this patient's care.  Junita Push PharmD 12/03/2022 3:01 AM

## 2022-12-03 NOTE — TOC Progression Note (Signed)
Transition of Care [facility]) - Progression Note    Patient Details  Name: Terry Hanson MRN: [id number] Date of Birth: 1940-03-27  [facility] CM/SW Contact  Karn Cassis, [place] Phone Number: 12/03/2022, 8:55 AM  Clinical Narrative:  Rolly Salter with [facility] accepts for HHPT. Pt notified. Will need HHPT order.      Expected Discharge Plan: Home w Home Health Services Barriers to Discharge: Continued Medical Work up  Expected Discharge Plan and Services In-house Referral: Clinical Social Work Discharge Planning Services: CM Consult Post Acute Care Choice: Home Health Living arrangements for the past 2 months: Single Family Home                                       Social Determinants of Health (SDOH) Interventions SDOH Screenings   Food Insecurity: No Food Insecurity (12/01/2022)  Housing: Low Risk  (12/01/2022)  Transportation Needs: No Transportation Needs (12/01/2022)  Utilities: Not At Risk (12/01/2022)  Tobacco Use: Medium Risk (12/01/2022)    Readmission Risk Interventions     No data to display

## 2022-12-03 NOTE — Plan of Care (Addendum)
       Spoke with the patient and daughter at the bed side. Patient in agreement with plan to be transferred to [facility] for LHC. She will need EGD and colonoscopy prior to [facility] which can be performed at [facility] or at [facility]. Keep NPO after midnight. Patient's daughter would like to be contacted prior to the transfer.   Altheia Shafran Verne Spurr, MD [place]  [facility]  4:50 PM

## 2022-12-03 NOTE — Progress Notes (Signed)
OT Cancellation Note  Patient Details Name: Terry Hanson MRN: [id number] DOB: [birth year]   Cancelled Treatment:    Reason Eval/Treat Not Completed: Other (comment). Pt agreeable to OT evaluation, however had company this am and requested OT come back at a later time. Will check back as schedule allows.    Ezra Sites, OTR/L  [phone] 12/03/2022, 11:01 AM

## 2022-12-03 NOTE — Progress Notes (Signed)
Within the monitoring period, HF diagnostics have  been abnormal.  Histogram shows predominant HR between 100-110 bpm. Next remote 91 days. - CS, CVRS  ECHOCARDIOGRAM COMPLETE  Result Date: 12/02/2022    ECHOCARDIOGRAM REPORT   Patient Name:   Terry Hanson Date of Exam: 12/02/2022 Medical Rec #:  [id number]   Height:       62.0 in Accession #:    [id number]  Weight:       207.5 lb Date of Birth:  [birth year]    BSA:          1.942 m Patient Age:    82 years    BP:           128/36 mmHg Patient Gender: F           HR:            77 bpm. Ex[facility] Location:  [facility] Procedure: 2D Echo, Cardiac Doppler, Color Doppler and Intracardiac            Opacification Agent                                MODIFIED REPORT:    This report was modified by Thomasene Ripple DO on 12/02/2022 due to diastolic                                  dysfunction.  Indications:     Syncope  History:         Patient has prior history of Echocardiogram examinations, most                  recent 05/24/2022. Cardiomyopathy, Pacemaker; Risk                  Factors:Diabetes.  Sonographer:     Darlys Gales Referring Phys:  [id number] OLADAPO ADEFESO Diagnosing Phys: Thomasene Ripple DO  Sonographer Comments: Technically difficult study due to poor echo windows. Image acquisition challenging due to patient body habitus. IMPRESSIONS  1. Left ventricular ejection fraction, by estimation, is 20 to 25%. The left ventricle has severely decreased function. The left ventricle demonstrates regional wall motion abnormalities (see scoring diagram/findings for description). Left ventricular diastolic parameters are consistent with Grade III diastolic dysfunction (restrictive). Elevated left atrial pressure.  2. Right ventricular systolic function is normal. The right ventricular size is normal.  3. The mitral valve is normal in structure. No evidence of mitral valve regurgitation. No evidence of mitral stenosis.  4. The aortic valve is normal in structure. Aortic valve regurgitation is not visualized. Mild to moderate aortic valve stenosis. Aortic valve area, by VTI measures 1.13 cm. Aortic valve mean gradient measures 8.0 mmHg. Aortic valve Vmax measures 1.76 m/s.  5. The inferior vena cava is normal in size with greater than 50% respiratory variability, suggesting right atrial pressure of 3 mmHg. FINDINGS  Left Ventricle: Left ventricular ejection fraction, by estimation, is 20 to 25%. The left ventricle has severely decreased function. The left ventricle demonstrates regional wall motion  abnormalities. Definity contrast agent was given IV to delineate the left ventricular endocardial borders. The left ventricular internal cavity size was normal in size. There is no left ventricular hypertrophy. Left ventricular diastolic parameters are consistent with Grade III diastolic dysfunction (restrictive). Elevated left atrial pressure.  LV Wall Scoring: The apical anterior segment, apical inferior segment, and  Gastroenterology Progress Note   Referring Provider: No ref. provider found Primary Care Physician:  Pcp, No Primary Gastroenterologist:  Dr. Marletta Lor (previously unassigned)  Patient ID: Terry Hanson; [id number]; [birth year]    Subjective   She states that she has had some mild intermittent dyspnea on exertion prior to her syncopal episode.  Currently denies any chest pain, shortness of breath, melena, BRBPR, or abdominal pain.  She does endorse significant fatigue.  Patient expresses to me during our conversation that she would like to be a DNR and would not want any chest compressions if something were to happen to her again.  She states she would like to discuss this further.  States that she told her daughter after her syncopal episode that she would not want her to try to resuscitate her again.  She also notes that her sons would want her to have everything done and be full code.  Objective   Vital signs in last 24 hours Temp:  [98.6 F (37 C)] 98.6 F (37 C) (09/16 0704) Pulse Rate:  [65-85] 67 (09/16 0935) Resp:  [17-20] 20 (09/16 0704) BP: (118-151)/(58-70) 118/68 (09/16 0704) SpO2:  [95 %-100 %] 95 % (09/16 0704) Last BM Date : 12/02/22  Physical Exam General:   Alert and oriented, pleasant Head:  Normocephalic and atraumatic. Eyes:  No icterus, sclera clear. Conjuctiva pink.  Mouth:  Without lesions, mucosa pink and moist.  Neck:  Supple, without thyromegaly or masses.  Heart:  S1, S2 present, no murmurs noted.  Abdomen:  Bowel sounds present, soft, non-tender, non-distended. No HSM or hernias noted. No rebound or guarding. No masses appreciated . Extremities:  Without clubbing or edema. Neurologic:  Alert and oriented x4;  grossly normal neurologically. Skin:  Warm and dry, intact without significant lesions.  Psych:  Alert and cooperative. Normal mood and affect.  Intake/Output from previous day: 09/15 0701 - 09/16 0700 In: 420.7 [P.O.:240; IV Piggyback:180.7] Out:  -  Intake/Output this shift: Total I/O In: 240 [P.O.:240] Out: -   Lab Results  Recent Labs    12/01/22 1630 12/02/22 0012 12/02/22 0417 12/03/22 0506  WBC 4.7  --  7.5 10.4  HGB 6.4* 12.2 11.4* 11.9*  HCT 20.5* 36.1 34.2* 37.2  PLT 102*  --  163 143*   BMET Recent Labs    12/02/22 0012 12/02/22 0417 12/03/22 0506  NA 134* 133* 135  K 6.0* 4.8 4.6  CL 104 104 106  CO2 21* 21* 20*  GLUCOSE 130* 108* 148*  BUN 46* 48* 41*  CREATININE 2.31* 2.18* 2.12*  CALCIUM 8.7* 8.4* 8.7*   LFT Recent Labs    12/01/22 2215 12/02/22 0012 12/02/22 0417  PROT 6.6 6.3* 6.0*  ALBUMIN 3.6 3.3* 3.2*  AST 21 19 15   ALT 19 18 16   ALKPHOS 25* 23* 21*  BILITOT 0.7 1.1 0.9   PT/INR No results for input(s): "LABPROT", "INR" in the last 72 hours. Hepatitis Panel No results for input(s): "HEPBSAG", "HCVAB", "HEPAIGM", "HEPBIGM" in the last 72 hours.  Studies/Results CUP PACEART REMOTE DEVICE CHECK  Result Date: 12/02/2022 Scheduled remote reviewed. Normal device function.  8 logged [facility] detections since last clinic visit, 5 device-classified SVT, all occurring on 11/01/22.  EGMs consistent with AF with RVR based on irregular R-R and morphology discrimination. Previously routed to triage, metoprolol dose increased.  Known history of AF, on Eliquis.  History of RVR episodes with inappropriate therapy, programming changes made in clinic, on digoxin and metoprolol per Epic.  Gastroenterology Progress Note   Referring Provider: No ref. provider found Primary Care Physician:  Pcp, No Primary Gastroenterologist:  Dr. Marletta Lor (previously unassigned)  Patient ID: Terry Hanson; [id number]; [birth year]    Subjective   She states that she has had some mild intermittent dyspnea on exertion prior to her syncopal episode.  Currently denies any chest pain, shortness of breath, melena, BRBPR, or abdominal pain.  She does endorse significant fatigue.  Patient expresses to me during our conversation that she would like to be a DNR and would not want any chest compressions if something were to happen to her again.  She states she would like to discuss this further.  States that she told her daughter after her syncopal episode that she would not want her to try to resuscitate her again.  She also notes that her sons would want her to have everything done and be full code.  Objective   Vital signs in last 24 hours Temp:  [98.6 F (37 C)] 98.6 F (37 C) (09/16 0704) Pulse Rate:  [65-85] 67 (09/16 0935) Resp:  [17-20] 20 (09/16 0704) BP: (118-151)/(58-70) 118/68 (09/16 0704) SpO2:  [95 %-100 %] 95 % (09/16 0704) Last BM Date : 12/02/22  Physical Exam General:   Alert and oriented, pleasant Head:  Normocephalic and atraumatic. Eyes:  No icterus, sclera clear. Conjuctiva pink.  Mouth:  Without lesions, mucosa pink and moist.  Neck:  Supple, without thyromegaly or masses.  Heart:  S1, S2 present, no murmurs noted.  Abdomen:  Bowel sounds present, soft, non-tender, non-distended. No HSM or hernias noted. No rebound or guarding. No masses appreciated . Extremities:  Without clubbing or edema. Neurologic:  Alert and oriented x4;  grossly normal neurologically. Skin:  Warm and dry, intact without significant lesions.  Psych:  Alert and cooperative. Normal mood and affect.  Intake/Output from previous day: 09/15 0701 - 09/16 0700 In: 420.7 [P.O.:240; IV Piggyback:180.7] Out:  -  Intake/Output this shift: Total I/O In: 240 [P.O.:240] Out: -   Lab Results  Recent Labs    12/01/22 1630 12/02/22 0012 12/02/22 0417 12/03/22 0506  WBC 4.7  --  7.5 10.4  HGB 6.4* 12.2 11.4* 11.9*  HCT 20.5* 36.1 34.2* 37.2  PLT 102*  --  163 143*   BMET Recent Labs    12/02/22 0012 12/02/22 0417 12/03/22 0506  NA 134* 133* 135  K 6.0* 4.8 4.6  CL 104 104 106  CO2 21* 21* 20*  GLUCOSE 130* 108* 148*  BUN 46* 48* 41*  CREATININE 2.31* 2.18* 2.12*  CALCIUM 8.7* 8.4* 8.7*   LFT Recent Labs    12/01/22 2215 12/02/22 0012 12/02/22 0417  PROT 6.6 6.3* 6.0*  ALBUMIN 3.6 3.3* 3.2*  AST 21 19 15   ALT 19 18 16   ALKPHOS 25* 23* 21*  BILITOT 0.7 1.1 0.9   PT/INR No results for input(s): "LABPROT", "INR" in the last 72 hours. Hepatitis Panel No results for input(s): "HEPBSAG", "HCVAB", "HEPAIGM", "HEPBIGM" in the last 72 hours.  Studies/Results CUP PACEART REMOTE DEVICE CHECK  Result Date: 12/02/2022 Scheduled remote reviewed. Normal device function.  8 logged [facility] detections since last clinic visit, 5 device-classified SVT, all occurring on 11/01/22.  EGMs consistent with AF with RVR based on irregular R-R and morphology discrimination. Previously routed to triage, metoprolol dose increased.  Known history of AF, on Eliquis.  History of RVR episodes with inappropriate therapy, programming changes made in clinic, on digoxin and metoprolol per Epic.  Within the monitoring period, HF diagnostics have  been abnormal.  Histogram shows predominant HR between 100-110 bpm. Next remote 91 days. - CS, CVRS  ECHOCARDIOGRAM COMPLETE  Result Date: 12/02/2022    ECHOCARDIOGRAM REPORT   Patient Name:   Terry Hanson Date of Exam: 12/02/2022 Medical Rec #:  [id number]   Height:       62.0 in Accession #:    [id number]  Weight:       207.5 lb Date of Birth:  [birth year]    BSA:          1.942 m Patient Age:    82 years    BP:           128/36 mmHg Patient Gender: F           HR:            77 bpm. Exam Location[facility] Procedure: 2D Echo, Cardiac Doppler, Color Doppler and Intracardiac            Opacification Agent                                MODIFIED REPORT:    This report was modified by Thomasene Ripple DO on 12/02/2022 due to diastolic                                  dysfunction.  Indications:     Syncope  History:         Patient has prior history of Echocardiogram examinations, most                  recent 05/24/2022. Cardiomyopathy, Pacemaker; Risk                  Factors:Diabetes.  Sonographer:     Darlys Gales Referring Phys:  [id number] OLADAPO ADEFESO Diagnosing Phys: Thomasene Ripple DO  Sonographer Comments: Technically difficult study due to poor echo windows. Image acquisition challenging due to patient body habitus. IMPRESSIONS  1. Left ventricular ejection fraction, by estimation, is 20 to 25%. The left ventricle has severely decreased function. The left ventricle demonstrates regional wall motion abnormalities (see scoring diagram/findings for description). Left ventricular diastolic parameters are consistent with Grade III diastolic dysfunction (restrictive). Elevated left atrial pressure.  2. Right ventricular systolic function is normal. The right ventricular size is normal.  3. The mitral valve is normal in structure. No evidence of mitral valve regurgitation. No evidence of mitral stenosis.  4. The aortic valve is normal in structure. Aortic valve regurgitation is not visualized. Mild to moderate aortic valve stenosis. Aortic valve area, by VTI measures 1.13 cm. Aortic valve mean gradient measures 8.0 mmHg. Aortic valve Vmax measures 1.76 m/s.  5. The inferior vena cava is normal in size with greater than 50% respiratory variability, suggesting right atrial pressure of 3 mmHg. FINDINGS  Left Ventricle: Left ventricular ejection fraction, by estimation, is 20 to 25%. The left ventricle has severely decreased function. The left ventricle demonstrates regional wall motion  abnormalities. Definity contrast agent was given IV to delineate the left ventricular endocardial borders. The left ventricular internal cavity size was normal in size. There is no left ventricular hypertrophy. Left ventricular diastolic parameters are consistent with Grade III diastolic dysfunction (restrictive). Elevated left atrial pressure.  LV Wall Scoring: The apical anterior segment, apical inferior segment, and  Gastroenterology Progress Note   Referring Provider: No ref. provider found Primary Care Physician:  Pcp, No Primary Gastroenterologist:  Dr. Marletta Lor (previously unassigned)  Patient ID: Terry Hanson; [id number]; [birth year]    Subjective   She states that she has had some mild intermittent dyspnea on exertion prior to her syncopal episode.  Currently denies any chest pain, shortness of breath, melena, BRBPR, or abdominal pain.  She does endorse significant fatigue.  Patient expresses to me during our conversation that she would like to be a DNR and would not want any chest compressions if something were to happen to her again.  She states she would like to discuss this further.  States that she told her daughter after her syncopal episode that she would not want her to try to resuscitate her again.  She also notes that her sons would want her to have everything done and be full code.  Objective   Vital signs in last 24 hours Temp:  [98.6 F (37 C)] 98.6 F (37 C) (09/16 0704) Pulse Rate:  [65-85] 67 (09/16 0935) Resp:  [17-20] 20 (09/16 0704) BP: (118-151)/(58-70) 118/68 (09/16 0704) SpO2:  [95 %-100 %] 95 % (09/16 0704) Last BM Date : 12/02/22  Physical Exam General:   Alert and oriented, pleasant Head:  Normocephalic and atraumatic. Eyes:  No icterus, sclera clear. Conjuctiva pink.  Mouth:  Without lesions, mucosa pink and moist.  Neck:  Supple, without thyromegaly or masses.  Heart:  S1, S2 present, no murmurs noted.  Abdomen:  Bowel sounds present, soft, non-tender, non-distended. No HSM or hernias noted. No rebound or guarding. No masses appreciated . Extremities:  Without clubbing or edema. Neurologic:  Alert and oriented x4;  grossly normal neurologically. Skin:  Warm and dry, intact without significant lesions.  Psych:  Alert and cooperative. Normal mood and affect.  Intake/Output from previous day: 09/15 0701 - 09/16 0700 In: 420.7 [P.O.:240; IV Piggyback:180.7] Out:  -  Intake/Output this shift: Total I/O In: 240 [P.O.:240] Out: -   Lab Results  Recent Labs    12/01/22 1630 12/02/22 0012 12/02/22 0417 12/03/22 0506  WBC 4.7  --  7.5 10.4  HGB 6.4* 12.2 11.4* 11.9*  HCT 20.5* 36.1 34.2* 37.2  PLT 102*  --  163 143*   BMET Recent Labs    12/02/22 0012 12/02/22 0417 12/03/22 0506  NA 134* 133* 135  K 6.0* 4.8 4.6  CL 104 104 106  CO2 21* 21* 20*  GLUCOSE 130* 108* 148*  BUN 46* 48* 41*  CREATININE 2.31* 2.18* 2.12*  CALCIUM 8.7* 8.4* 8.7*   LFT Recent Labs    12/01/22 2215 12/02/22 0012 12/02/22 0417  PROT 6.6 6.3* 6.0*  ALBUMIN 3.6 3.3* 3.2*  AST 21 19 15   ALT 19 18 16   ALKPHOS 25* 23* 21*  BILITOT 0.7 1.1 0.9   PT/INR No results for input(s): "LABPROT", "INR" in the last 72 hours. Hepatitis Panel No results for input(s): "HEPBSAG", "HCVAB", "HEPAIGM", "HEPBIGM" in the last 72 hours.  Studies/Results CUP PACEART REMOTE DEVICE CHECK  Result Date: 12/02/2022 Scheduled remote reviewed. Normal device function.  8 logged [facility] detections since last clinic visit, 5 device-classified SVT, all occurring on 11/01/22.  EGMs consistent with AF with RVR based on irregular R-R and morphology discrimination. Previously routed to triage, metoprolol dose increased.  Known history of AF, on Eliquis.  History of RVR episodes with inappropriate therapy, programming changes made in clinic, on digoxin and metoprolol per Epic.  Within the monitoring period, HF diagnostics have  been abnormal.  Histogram shows predominant HR between 100-110 bpm. Next remote 91 days. - CS, CVRS  ECHOCARDIOGRAM COMPLETE  Result Date: 12/02/2022    ECHOCARDIOGRAM REPORT   Patient Name:   Terry Hanson Date of Exam: 12/02/2022 Medical Rec #:  [id number]   Height:       62.0 in Accession #:    [id number]  Weight:       207.5 lb Date of Birth:  [birth year]    BSA:          1.942 m Patient Age:    82 years    BP:           128/36 mmHg Patient Gender: F           HR:            77 bpm. [facility]am Location[facility] Procedure: 2D Echo, Cardiac Doppler, Color Doppler and Intracardiac            Opacification Agent                                MODIFIED REPORT:    This report was modified by Thomasene Ripple DO on 12/02/2022 due to diastolic                                  dysfunction.  Indications:     Syncope  History:         Patient has prior history of Echocardiogram examinations, most                  recent 05/24/2022. Cardiomyopathy, Pacemaker; Risk                  Factors:Diabetes.  Sonographer:     Darlys Gales Referring Phys:  [id number] OLADAPO ADEFESO Diagnosing Phys: Thomasene Ripple DO  Sonographer Comments: Technically difficult study due to poor echo windows. Image acquisition challenging due to patient body habitus. IMPRESSIONS  1. Left ventricular ejection fraction, by estimation, is 20 to 25%. The left ventricle has severely decreased function. The left ventricle demonstrates regional wall motion abnormalities (see scoring diagram/findings for description). Left ventricular diastolic parameters are consistent with Grade III diastolic dysfunction (restrictive). Elevated left atrial pressure.  2. Right ventricular systolic function is normal. The right ventricular size is normal.  3. The mitral valve is normal in structure. No evidence of mitral valve regurgitation. No evidence of mitral stenosis.  4. The aortic valve is normal in structure. Aortic valve regurgitation is not visualized. Mild to moderate aortic valve stenosis. Aortic valve area, by VTI measures 1.13 cm. Aortic valve mean gradient measures 8.0 mmHg. Aortic valve Vmax measures 1.76 m/s.  5. The inferior vena cava is normal in size with greater than 50% respiratory variability, suggesting right atrial pressure of 3 mmHg. FINDINGS  Left Ventricle: Left ventricular ejection fraction, by estimation, is 20 to 25%. The left ventricle has severely decreased function. The left ventricle demonstrates regional wall motion  abnormalities. Definity contrast agent was given IV to delineate the left ventricular endocardial borders. The left ventricular internal cavity size was normal in size. There is no left ventricular hypertrophy. Left ventricular diastolic parameters are consistent with Grade III diastolic dysfunction (restrictive). Elevated left atrial pressure.  LV Wall Scoring: The apical anterior segment, apical inferior segment, and  Within the monitoring period, HF diagnostics have  been abnormal.  Histogram shows predominant HR between 100-110 bpm. Next remote 91 days. - CS, CVRS  ECHOCARDIOGRAM COMPLETE  Result Date: 12/02/2022    ECHOCARDIOGRAM REPORT   Patient Name:   Terry Hanson Date of Exam: 12/02/2022 Medical Rec #:  [id number]   Height:       62.0 in Accession #:    [id number]  Weight:       207.5 lb Date of Birth:  [birth year]    BSA:          1.942 m Patient Age:    82 years    BP:           128/36 mmHg Patient Gender: F           HR:            77 bpm. [facility]am Location[facility] Procedure: 2D Echo, Cardiac Doppler, Color Doppler and Intracardiac            Opacification Agent                                MODIFIED REPORT:    This report was modified by Thomasene Ripple DO on 12/02/2022 due to diastolic                                  dysfunction.  Indications:     Syncope  History:         Patient has prior history of Echocardiogram examinations, most                  recent 05/24/2022. Cardiomyopathy, Pacemaker; Risk                  Factors:Diabetes.  Sonographer:     Darlys Gales Referring Phys:  [id number] OLADAPO ADEFESO Diagnosing Phys: Thomasene Ripple DO  Sonographer Comments: Technically difficult study due to poor echo windows. Image acquisition challenging due to patient body habitus. IMPRESSIONS  1. Left ventricular ejection fraction, by estimation, is 20 to 25%. The left ventricle has severely decreased function. The left ventricle demonstrates regional wall motion abnormalities (see scoring diagram/findings for description). Left ventricular diastolic parameters are consistent with Grade III diastolic dysfunction (restrictive). Elevated left atrial pressure.  2. Right ventricular systolic function is normal. The right ventricular size is normal.  3. The mitral valve is normal in structure. No evidence of mitral valve regurgitation. No evidence of mitral stenosis.  4. The aortic valve is normal in structure. Aortic valve regurgitation is not visualized. Mild to moderate aortic valve stenosis. Aortic valve area, by VTI measures 1.13 cm. Aortic valve mean gradient measures 8.0 mmHg. Aortic valve Vmax measures 1.76 m/s.  5. The inferior vena cava is normal in size with greater than 50% respiratory variability, suggesting right atrial pressure of 3 mmHg. FINDINGS  Left Ventricle: Left ventricular ejection fraction, by estimation, is 20 to 25%. The left ventricle has severely decreased function. The left ventricle demonstrates regional wall motion  abnormalities. Definity contrast agent was given IV to delineate the left ventricular endocardial borders. The left ventricular internal cavity size was normal in size. There is no left ventricular hypertrophy. Left ventricular diastolic parameters are consistent with Grade III diastolic dysfunction (restrictive). Elevated left atrial pressure.  LV Wall Scoring: The apical anterior segment, apical inferior segment, and

## 2022-12-03 NOTE — Progress Notes (Signed)
Patient alert and verbal, verbalized no complaints of pain or discomfort. Patient signed consent for EGD and colonoscopy, placed consent in chart. Patient ambulated with assistance to the bathroom, reported no complaints.

## 2022-12-03 NOTE — Progress Notes (Signed)
Progress Note   Patient: Terry Hanson LKG:[id number] DOB: 06-13-1940 DOA: 12/01/2022     1 DOS: the patient was seen and examined on 12/03/2022   Brief hospital admission narrative: As per H&P written by Dr. Thomes Dinning on 12/01/2022 Terry Hanson is a 82 y.o. female with medical history significant of chronic systolic CHF, atrial fibrillation, CAD s/p stent placement (DES to proximal LAD 06/2019), CKD 3 who presents to the emergency department from home via EMS due to syncope.  Patient's daughter at bedside states that she heard a large thud coming from patient's bedroom, she quickly went to check on patient and saw her on the floor with a gurgling sound, she tried to wake her up when she noted the patient stopped breathing, she quickly called EMS and was told to start CPR.  After fifth round of chest compressions, she heard a loud popping sound from her chest and patient woke up.  EMS team transported patient to the ED for further evaluation and management.  Patient was alert and oriented x 3 on arrival to the ED.  Patient complained of about 3-week onset of generalized weakness and an occasional nonproductive mild cough.  She denies chest pain, shortness of breath, fever, chills, nausea, vomiting.   ED Course:  In the emergency department, BP on arrival was 93/49, respiratory rate was 27/min, other vital signs were within normal range.  Workup in the ED showed hemoglobin of 6.4, hematocrit 20.5, MCV 100.5, platelets 102.  BMP showed potassium 2.2, chloride 123, bicarb 11, BUN 26, creatinine 1.13, calcium 4.1, GFR 49.  Magnesium 1.1, total CK21, FOBT was negative, troponin 7 > 32.  Influenza A, B, SARS coronavirus 2, RSV was negative.  Blood culture pending. CT head without contrast showed no acute intracranial abnormality CT cervical spine without contrast showed no fracture or acute finding CT abdomen and pelvis showed diverticulosis without diverticulitis. Chest x-ray showed no active disease IV hydration  was provided, calcium, magnesium and potassium were replenished.  2 units of packed red blood cells were ordered to be transfused in the ED.  Hospitalist was asked to admit patient for further evaluation and management.  Assessment and Plan: Syncope event -high Concerns for arrhythmias driving patient's symptoms and presentation. -Continue telemetry monitoring -Follow 2D echo results -Continue electrolyte repletion and follow trend/stability. -Cardiology consulted; will follow rec'a and ICD interrogation. -Patient will benefit of cardiac monitoring at discharge.  Systolic congestive heart failure -Chronic and currently compensated -Continue to follow daily weights and strict I's and O's -Will continue treatment with metoprolol and digoxin -continue Holding Aldactone, Lasix and Entresto at the moment. -Follow echo results. -cardiology consulted and will follow rec's.  Hyperlipidemia -Continue statin  Class 2 obesity Body mass index is 37.94 kg/m.  -Control and increase physical activity discussed with patient.  Hypertension -improved after holding antihypertensive agents. -continue Holding Entresto, Aldactone and Lasix -Follow vital signs. 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 -continue Holding nephrotoxic agents temporarily -continue Maintaining adequate hydration -Will follow-up renal function trend and electrolytes stability.  Elevated troponin -Most likely demand ischemia in the setting of chest compression and worsening renal function -No acute ischemic changes on telemetry or EKG -Continue close monitoring. -cardiology consulted; will follow echo results.  History of atrial fibrillation -Appears to be paroxysmal -cardiology consulted for clearance frior to GI procedures and for ICD interrogation. -Continue metoprolol and digoxin for rate  control -continue Holding Eliquis with concerns for bright red blood per rectum/anemia -Continue telemetry monitoring.  Anemia/GI bleed -Unclear -Bright red blood per rectum appreciated while hospitalized and at time of admission low hemoglobin present. -Status post 2 unit PRBCs transfused -No further overt bleeding reported; hemoglobin 11.9 currently. -Continue to follow hemoglobin trend -GI service has been consulted to assist with any endoscopic evaluation procedures. -Continue holding Eliquis at the moment and continue PPI twice a day.  Macrocytosis/B12 deficiency -B12 level 153 -(Planning for intramuscular B12 daily for 7 days, then weekly x 1 month and then monthly after that). -Continue supportive care and follow levels in the outpatient setting.   Subjective:  No overnight events; no chest pain (but expressing is still feeling slightly sore in her left chest), no shortness of breath, no nausea, no vomiting.  No overt bleeding reported.  Physical Exam: Vitals:   12/02/22 2141 12/03/22 0704 12/03/22 0935 12/03/22 1410  BP: (!) 144/66 118/68  134/61  Pulse: 85 65 67 63  Resp: 20 20  18   Temp: 98.6 F (37 C) 98.6 F (37 C)  98 F (36.7 C)  TempSrc: Oral Oral  Oral  SpO2: 100% 95%  100%  Weight:      Height:       General exam: Alert, awake, oriented x 3; pleasant and in no acute distress. Respiratory system: Good air movement bilaterally; no using accessory muscle.  Good saturation on room air. Cardiovascular system: Rate controlled, no rubs, no gallops, no JVD.  Positive murmur appreciated on exam. Gastrointestinal system: Abdomen is obese, nondistended, soft and nontender. No organomegaly or masses felt. Normal bowel sounds heard. Central nervous system: No focal neurological deficits. Extremities: No cyanosis, clubbing or edema. Skin: No petechiae. Psychiatry: Judgement and insight appear normal. Mood & affect appropriate.   Data Reviewed: CBC: White blood cell  7.4, hemoglobin 11.9 and platelet count 143K Based metabolic panel: Sodium 135, potassium 4.6, chloride 106, bicarb 20, BUN 41, creatinine 2.12 and GFR 23 Magnesium: 2.6 B12: 153  Family Communication: No family at bedside.  Disposition: Status is: Observation The patient will require care spanning > 2 midnights and should be moved to inpatient because: Continue to follow hemoglobin trend and furtheras needed.  Patient with bright red blood per rectum and a GI service has been involved.  Continue telemetry monitoring; hold Eliquis, start PPI twice a day.   Planned Discharge Destination: Home with Home Health  Time spent: 50 minutes  Author: Vassie Loll, MD 12/03/2022 5:05 PM  For on call review [email or web address].

## 2022-12-03 NOTE — Plan of Care (Signed)

## 2022-12-04 ENCOUNTER — Encounter (HOSPITAL_COMMUNITY)
Admission: EM | Disposition: A | Discharge status: Home Health | Payer: Self-pay | Source: Home / Self Care | Attending: Internal Medicine

## 2022-12-04 ENCOUNTER — Encounter (HOSPITAL_COMMUNITY): Payer: Self-pay | Admitting: Anesthesiology

## 2022-12-04 DIAGNOSIS — I959 Hypotension, unspecified: Secondary | ICD-10-CM | POA: Diagnosis not present

## 2022-12-04 DIAGNOSIS — D539 Nutritional anemia, unspecified: Secondary | ICD-10-CM | POA: Diagnosis not present

## 2022-12-04 DIAGNOSIS — I2510 Atherosclerotic heart disease of native coronary artery without angina pectoris: Secondary | ICD-10-CM | POA: Diagnosis not present

## 2022-12-04 DIAGNOSIS — I4729 Other ventricular tachycardia: Secondary | ICD-10-CM

## 2022-12-04 DIAGNOSIS — I4901 Ventricular fibrillation: Secondary | ICD-10-CM | POA: Diagnosis not present

## 2022-12-04 DIAGNOSIS — I4819 Other persistent atrial fibrillation: Secondary | ICD-10-CM | POA: Diagnosis not present

## 2022-12-04 DIAGNOSIS — I5022 Chronic systolic (congestive) heart failure: Secondary | ICD-10-CM | POA: Diagnosis not present

## 2022-12-04 DIAGNOSIS — R55 Syncope and collapse: Secondary | ICD-10-CM | POA: Diagnosis not present

## 2022-12-04 DIAGNOSIS — N183 Chronic kidney disease, stage 3 unspecified: Secondary | ICD-10-CM

## 2022-12-04 DIAGNOSIS — D649 Anemia, unspecified: Secondary | ICD-10-CM

## 2022-12-04 LAB — CULTURE, BLOOD (ROUTINE X 2)

## 2022-12-04 LAB — CBC
HCT: 33.3 % — ABNORMAL LOW (ref 36.0–46.0)
Hemoglobin: 11 g/dL — ABNORMAL LOW (ref 12.0–15.0)
MCH: 32.4 pg (ref 26.0–34.0)
MCHC: 33 g/dL (ref 30.0–36.0)
MCV: 97.9 fL (ref 80.0–100.0)
Platelets: 142 10*3/uL — ABNORMAL LOW (ref 150–400)
RBC: 3.4 MIL/uL — ABNORMAL LOW (ref 3.87–5.11)
RDW: 14.5 % (ref 11.5–15.5)
WBC: 7.1 10*3/uL (ref 4.0–10.5)
nRBC: 0 % (ref 0.0–0.2)

## 2022-12-04 LAB — BASIC METABOLIC PANEL
Anion gap: 11 (ref 5–15)
BUN: 37 mg/dL — ABNORMAL HIGH (ref 8–23)
CO2: 20 mmol/L — ABNORMAL LOW (ref 22–32)
Calcium: 8.6 mg/dL — ABNORMAL LOW (ref 8.9–10.3)
Chloride: 103 mmol/L (ref 98–111)
Creatinine, Ser: 2.13 mg/dL — ABNORMAL HIGH (ref 0.44–1.00)
GFR, Estimated: 23 mL/min — ABNORMAL LOW (ref >60)
Glucose, Bld: 91 mg/dL (ref 70–99)
Potassium: 4.3 mmol/L (ref 3.5–5.1)
Sodium: 134 mmol/L — ABNORMAL LOW (ref 135–145)

## 2022-12-04 SURGERY — ESOPHAGOGASTRODUODENOSCOPY (EGD) WITH PROPOFOL
Anesthesia: Monitor Anesthesia Care

## 2022-12-04 NOTE — Progress Notes (Addendum)
Progress Note  Patient Name: Terry Hanson Date of Encounter: 12/04/2022  Primary Cardiologist: Marjo Bicker, MD  Subjective   No acute events overnight.  No symptoms.  No dizziness/lightheadedness.  She is n.p.o. today for bowel prep for EGD and colonoscopy.  Inpatient Medications    Scheduled Meds:  Chlorhexidine Gluconate Cloth  6 each Topical Daily   cyanocobalamin  1,000 mcg Intramuscular Daily   digoxin  0.0625 mg Oral Once per day on Monday Wednesday Friday   folic acid  1 mg Oral Daily   metoprolol succinate  50 mg Oral QHS   nystatin   Topical TID   pantoprazole (PROTONIX) IV  40 mg Intravenous Q12H   simvastatin  20 mg Oral QPM   Continuous Infusions:  [START ON 12/05/2022] vancomycin     PRN Meds: acetaminophen **OR** acetaminophen, ondansetron **OR** ondansetron (ZOFRAN) IV, mouth rinse   Vital Signs    Vitals:   12/03/22 2139 12/04/22 0041 12/04/22 0432 12/04/22 0852  BP: (!) 100/57 (!) 107/53 (!) 99/46 (!) 107/59  Pulse:   72 76  Resp:  18 18 19   Temp:   98 F (36.7 C) 98.7 F (37.1 C)  TempSrc:    Oral  SpO2:  95% 94% 98%  Weight:      Height:        Intake/Output Summary (Last 24 hours) at 12/04/2022 1131 Last data filed at 12/03/2022 1755 Gross per 24 hour  Intake 720 ml  Output --  Net 720 ml   Filed Weights   12/01/22 1609 12/01/22 2250 12/02/22 0500  Weight: 88 kg 94.1 kg 94.1 kg    Telemetry     Personally reviewed, frequent PVCs and ventricular bigeminy  ECG    Not performed today  Physical Exam   GEN: No acute distress.   Neck: No JVD. Cardiac: RRR, no murmur, rub, or gallop.  Respiratory: Nonlabored. Clear to auscultation bilaterally. GI: Soft, nontender, bowel sounds present. MS: No edema; No deformity. Neuro:  Nonfocal. Psych: Alert and oriented x 3. Normal affect.  Labs    Chemistry Recent Labs  Lab 12/01/22 2215 12/02/22 0012 12/02/22 0417 12/03/22 0506 12/04/22 0838  NA 134* 134* 133* 135 134*  K  6.2* 6.0* 4.8 4.6 4.3  CL 105 104 104 106 103  CO2 22 21* 21* 20* 20*  GLUCOSE 154* 130* 108* 148* 91  BUN 47* 46* 48* 41* 37*  CREATININE 2.43* 2.31* 2.18* 2.12* 2.13*  CALCIUM 8.8* 8.7* 8.4* 8.7* 8.6*  PROT 6.6 6.3* 6.0*  --   --   ALBUMIN 3.6 3.3* 3.2*  --   --   AST 21 19 15   --   --   ALT 19 18 16   --   --   ALKPHOS 25* 23* 21*  --   --   BILITOT 0.7 1.1 0.9  --   --   GFRNONAA 20* 21* 22* 23* 23*  ANIONGAP 7 9 8 9 11      Hematology Recent Labs  Lab 12/02/22 0417 12/03/22 0506 12/04/22 0838  WBC 7.5 10.4 7.1  RBC 3.54* 3.78* 3.40*  HGB 11.4* 11.9* 11.0*  HCT 34.2* 37.2 33.3*  MCV 96.6 98.4 97.9  MCH 32.2 31.5 32.4  MCHC 33.3 32.0 33.0  RDW 14.7 14.5 14.5  PLT 163 143* 142*    Cardiac Enzymes Recent Labs  Lab 12/01/22 1630 12/01/22 1738 12/01/22 2215 12/02/22 0012  TROPONINIHS 7 32* 85* 80*    BNPNo results for  input(s): "BNP", "PROBNP" in the last 168 hours.   DDimerNo results for input(s): "DDIMER" in the last 168 hours.   Radiology    No results found.   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 No VT. Asymptomatic. No recurrence of ICD shocks.  Continue metoprolol succinate 50 mg once daily. Did not tolerate amiodarone in the past due to nausea and vomiting. She will need invasive ischemia evaluation after GI workup (scheduled for EGD and colonoscopy today) is completed. No indication of mexiletine until ischemia is completely ruled out.  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 Eliquis on hold due to concern for GI bleed. Will continue to hold Eliquis for LHC.  Will  schedule LHC at [facility] tomorrow. Electrophysiology will follow at [facility]. -Continue GDMT for ischemic cardiomyopathy. Entresto and spironolactone held due to soft blood pressures.    Signed, Marjo Bicker, MD  12/04/2022, 11:31 AM

## 2022-12-04 NOTE — Progress Notes (Signed)
Progress Note   Patient: Terry Hanson:[id number] DOB: Jul 12, 1940 DOA: 12/01/2022     2 DOS: the patient was seen and examined on 12/04/2022   Brief hospital admission narrative: As per H&P written by Dr. Thomes Dinning on 12/01/2022 Terry Hanson is a 82 y.o. female with medical history significant of chronic systolic CHF, atrial fibrillation, CAD s/p stent placement (DES to proximal LAD 06/2019), CKD 3 who presents to the emergency department from home via EMS due to syncope.  Patient's daughter at bedside states that she heard a large thud coming from patient's bedroom, she quickly went to check on patient and saw her on the floor with a gurgling sound, she tried to wake her up when she noted the patient stopped breathing, she quickly called EMS and was told to start CPR.  After fifth round of chest compressions, she heard a loud popping sound from her chest and patient woke up.  EMS team transported patient to the ED for further evaluation and management.  Patient was alert and oriented x 3 on arrival to the ED.  Patient complained of about 3-week onset of generalized weakness and an occasional nonproductive mild cough.  She denies chest pain, shortness of breath, fever, chills, nausea, vomiting.   ED Course:  In the emergency department, BP on arrival was 93/49, respiratory rate was 27/min, other vital signs were within normal range.  Workup in the ED showed hemoglobin of 6.4, hematocrit 20.5, MCV 100.5, platelets 102.  BMP showed potassium 2.2, chloride 123, bicarb 11, BUN 26, creatinine 1.13, calcium 4.1, GFR 49.  Magnesium 1.1, total CK21, FOBT was negative, troponin 7 > 32.  Influenza A, B, SARS coronavirus 2, RSV was negative.  Blood culture pending. CT head without contrast showed no acute intracranial abnormality CT cervical spine without contrast showed no fracture or acute finding CT abdomen and pelvis showed diverticulosis without diverticulitis. Chest x-ray showed no active disease IV hydration  was provided, calcium, magnesium and potassium were replenished.  2 units of packed red blood cells were ordered to be transfused in the ED.  Hospitalist was asked to admit patient for further evaluation and management.  Assessment and Plan: Syncope event -Following ICD interrogation patient's syncope associated with arrhythmia and ICD firing at home. -Continue telemetry monitoring -Continue electrolyte repletion and follow trend/stability. -Cardiology consulted; follow recommendations patient will be transferred to [facility] for further evaluation and management by electrophysiology's and cardiology team (patient will require left heart cath evaluation).  Systolic congestive heart failure -Chronic and currently compensated -Continue to follow daily weights and strict I's and O's -Will continue treatment with metoprolol and digoxin -continue Holding Aldactone, Lasix and Entresto at the moment. -Follow echo results. -cardiology consulted and will follow rec's.  Hyperlipidemia -Continue statin -Heart healthy diet discussed with patient.  Class 2 obesity Body mass index is 37.94 kg/m.  -Control and increase physical activity discussed with patient.  Hypertension -improved after holding antihypertensive agents. -continue Holding Entresto, Aldactone and Lasix -Follow vital signs. 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 -will continue Holding nephrotoxic agents temporarily -continue Maintaining adequate hydration -Will continue to follow-up renal function trend and electrolytes stability. -Avoid hypotension and minimize the use of contrast as much as possible. -In anticipation of needing heart cath gentle hydration provided following cardiology service recommendation.  Elevated troponin -Most likely demand ischemia in the setting of chest compression and  worsening renal function -No acute ischemic changes on telemetry or EKG -Continue close monitoring. -cardiology consulted; will follow rec's -echo demonstrating: EF 20-25%, left ventricle with very decreased function, mild to moderate aortic valve stenosis, positive wall motion abnormalities (inferior segment and apex akinetic; anterior wall entire lateral wall, entire septum and inferior wall hypokinetic), And grade 3 diastolic dysfunction.  History of atrial fibrillation -Appears to be paroxysmal -cardiology consulted for clearance frior to GI procedures and for ICD interrogation. -Continue metoprolol and digoxin for rate control -continue Holding Eliquis with concerns for bright red blood per rectum/anemia -Continue telemetry monitoring.  Anemia/GI bleed -Unclear -Bright red blood per rectum appreciated while hospitalized and at time of admission low hemoglobin present. -Status post 2 unit PRBCs transfused -No further overt bleeding reported; hemoglobin 11.0 currently. -Continue to follow hemoglobin trend and transfuse as needed. -GI service has been consulted to assist with any endoscopic evaluation procedures; given decreased ejection fraction cardiology/AP has recommended procedures to be done at [facility] in order to be able to provide any support needed. -Patient will be transfer on 12/04/2022 to telemetry bed at [facility]. -Continue holding Eliquis at the moment and continue PPI twice a day.  Macrocytosis/B12 deficiency -B12 level 153 -(Planning for intramuscular B12 daily for 7 days, then weekly x 1 month and then monthly after that). -Continue supportive care and follow levels in the outpatient setting. -Repeat B12 level as an outpatient in 12 weeks.   Subjective:  Expressed to be hungry; just minor chest discomfort.  No abdominal pain, no nausea, no vomiting (patient reported vomiting once overnight with drinking bowel prep), no overt bleeding.  She denies palpitations,  dizziness and lightheadedness.  Physical Exam: Vitals:   12/03/22 2139 12/04/22 0041 12/04/22 0432 12/04/22 0852  BP: (!) 100/57 (!) 107/53 (!) 99/46 (!) 107/59  Pulse:   72 76  Resp:  18 18 19   Temp:   98 F (36.7 C) 98.7 F (37.1 C)  TempSrc:    Oral  SpO2:  95% 94% 98%  Weight:      Height:       General exam: Alert, awake, oriented x 3; in no distress.  No overnight events. Respiratory system: Good saturation on room air; no using accessory muscle.  Normal respiratory effort. Cardiovascular system: Rate controlled, no rubs, no gallops, unable to properly assess JVD with body habitus. Gastrointestinal system: Abdomen is obese, nondistended, soft and nontender. No organomegaly or masses felt. Normal bowel sounds heard. Central nervous system: No focal neurological deficits. Extremities: No cyanosis or clubbing.  No edema. Skin: No petechiae. Psychiatry: Judgement and insight appear normal. Mood & affect appropriate.   Data Reviewed: CBC: White blood cell 7.1, hemoglobin 11.0 and platelet count 142K Basic metabolic panel: Sodium 134, potassium 4.3, chloride 103, bicarb 20, BUN 37, creatinine 2.13, GFR 23 Magnesium: 2.6 B12: 153  Family Communication: No family at bedside. Daughter updated over the phone (12/04/22).  Disposition: Status is: Observation The patient will require care spanning > 2 midnights and should be moved to inpatient because: Continue to follow hemoglobin trend and furtheras needed.  Patient with bright red blood per rectum and a GI service has been involved.  Continue telemetry monitoring; hold Eliquis, start PPI twice a day.   Planned Discharge Destination: Home with Home Health  Time spent: 50 minutes  Author: Vassie Loll, MD 12/04/2022 12:12 PM  For on call review [email or web address].

## 2022-12-04 NOTE — Plan of Care (Signed)
NAME:  Terry Hanson    MRN: [id number] DOB:  1941-01-13    ADMIT DATE: 12/01/2022  Spoke with Dr Lalla Brothers. Recommended to cancel EGD/colonoscopy and that any non-cardiac procedures will need to be performed at higher level of care with EP around. Primary team and GI updated. EGD and colonoscopy canceled. Patient will be transferred to [facility] today.  [name], MD [place]  [facility]  12:40 PM

## 2022-12-04 NOTE — Evaluation (Signed)
Occupational Therapy Evaluation Patient Details Name: Terry Hanson MRN: [id number] DOB: Nov 13, 1940 Today's Date: 12/04/2022   History of Present Illness Terry Hanson is a 82 y.o. female with medical history significant of chronic systolic CHF, atrial fibrillation, CAD s/p stent placement (DES to proximal LAD 06/2019), CKD 3 who presents to the emergency department from home via EMS due to syncope.  Patient's daughter at bedside states that she heard a large thud coming from patient's bedroom, she quickly went to check on patient and saw her on the floor with a gurgling sound, she tried to wake her up when she noted the patient stopped breathing, she quickly called EMS and was told to start CPR.  After fifth round of chest compressions, she heard a loud popping sound from her chest and patient woke up.  EMS team transported patient to the ED for further evaluation and management.  Patient was alert and oriented x 3 on arrival to the ED.  Patient complained of about 3-week onset of generalized weakness and an occasional nonproductive mild cough.  She denies chest pain, shortness of breath, fever, chills, nausea, vomiting.   Clinical Impression   Pt agreeable to OT evaluation, performing ADLs and functional mobility at supervision/mod I level. Pt reports no dizziness during session, no LOB. Pt completing mobility without DME. No further OT services required at this time.        If plan is discharge home, recommend the following: Assistance with cooking/housework    Functional Status Assessment  Patient has had a recent decline in their functional status and demonstrates the ability to make significant improvements in function in a reasonable and predictable amount of time.  Equipment Recommendations  None recommended by OT       Precautions / Restrictions Precautions Precautions: Fall Restrictions Weight Bearing Restrictions: No      Mobility Bed Mobility               General bed  mobility comments: up in chair on OT arrival    Transfers Overall transfer level: Modified independent                            ADL either performed or assessed with clinical judgement   ADL Overall ADL's : Needs assistance/impaired     Grooming: Wash/dry hands;Brushing hair;Supervision/safety;Standing Grooming Details (indicate cue type and reason): Pt standing at sink for tasks, no difficulty, no balance deficits                 Toilet Transfer: Supervision/safety;Ambulation Toilet Transfer Details (indicate cue type and reason): independent, no RW Toileting- Clothing Manipulation and Hygiene: Supervision/safety;Sitting/lateral lean;Sit to/from stand Toileting - Clothing Manipulation Details (indicate cue type and reason): independent     Functional mobility during ADLs: Supervision/safety       Vision Baseline Vision/History: 1 Wears glasses Ability to See in Adequate Light: 1 Impaired Patient Visual Report: No change from baseline Vision Assessment?: No apparent visual deficits            Pertinent Vitals/Pain Pain Assessment Pain Assessment: 0-10 Pain Score: 6  Pain Location: headache Pain Descriptors / Indicators: Headache Pain Intervention(s): Limited activity within patient's tolerance, Monitored during session     Extremity/Trunk Assessment     Lower Extremity Assessment Lower Extremity Assessment: Defer to PT evaluation   Cervical / Trunk Assessment Cervical / Trunk Assessment: Kyphotic   Communication Communication Communication: No apparent difficulties   Cognition Arousal: Alert  Behavior During Therapy: WFL for tasks assessed/performed Overall Cognitive Status: Within Functional Limits for tasks assessed                                                  Home Living Family/patient expects to be discharged to:: Private residence Living Arrangements: Children (daughter) Available Help at Discharge:  Family;Available PRN/intermittently Type of Home: Mobile home Home Access: Stairs to enter Entrance Stairs-Number of Steps: 3 total Entrance Stairs-Rails: None Home Layout: One level     Bathroom Shower/Tub: Chief Strategy Officer: Standard     Home Equipment: Teacher, English as a foreign language (2 wheels);Cane - single point;Tub bench;Rollator (4 wheels)          Prior Functioning/Environment Prior Level of Function : Independent/Modified Independent;History of Falls (last six months)             Mobility Comments: Uses rollator outside as needed, not in the house ADLs Comments: Independent        OT Problem List: Decreased activity tolerance       AM-PAC OT "6 Clicks" Daily Activity     Outcome Measure Help from another person eating meals?: None Help from another person taking care of personal grooming?: None Help from another person toileting, which includes using toliet, bedpan, or urinal?: None Help from another person bathing (including washing, rinsing, drying)?: None Help from another person to put on and taking off regular upper body clothing?: None Help from another person to put on and taking off regular lower body clothing?: None 6 Click Score: 24   End of Session    Activity Tolerance: Patient tolerated treatment well Patient left: in chair;with call bell/phone within reach;with chair alarm set  OT Visit Diagnosis: Repeated falls (R29.6);Muscle weakness (generalized) (M62.81)                Time: 4098-1191 OT Time Calculation (min): 14 min Charges:  OT General Charges $OT Visit: 1 Visit OT Evaluation $OT Eval Low Complexity: 1 Low  Ezra Sites, OTR/L  [phone] 12/04/2022, 11:41 AM

## 2022-12-04 NOTE — Plan of Care (Signed)
Problem: Education: Goal: Knowledge of General Education information will improve Description: Including pain rating scale, medication(s)/side effects and non-pharmacologic comfort measures Outcome: Progressing   Problem: Health Behavior/Discharge Planning: Goal: Ability to manage health-related needs will improve Outcome: Progressing   Problem: Activity: Goal: Risk for activity intolerance will decrease Outcome: Progressing

## 2022-12-04 NOTE — Progress Notes (Addendum)
Our team has faxed a copy of the manual device interrogation done by Dr. Jenene Slicker yesterday down to the team at [facility] for review. Dr. Lalla Brothers has reviewed and discussed with Dr. Jenene Slicker. Per Dr. Jenene Slicker, hold on cardiac cath, pending EP eval down at [facility] and completion of GI procedures. Provided update to Trish; recommend gen cards and EP to continue to follow at [facility].

## 2022-12-04 NOTE — Progress Notes (Signed)
I was reached by the cardiology team regarding the review of device interrogation.  Patient presented abnormal ventricular rhythm and will need to have further evaluation at [facility] electrophysiology.  Due to this, EGD and colonoscopy at [facility] will be canceled.  This may need to be performed at [facility].

## 2022-12-04 NOTE — Progress Notes (Signed)
Mobility Specialist Progress Note:    12/04/22 0930  Mobility  Activity Ambulated with assistance in hallway  Level of Assistance Standby assist, set-up cues, supervision of patient - no hands on  Assistive Device Front wheel walker  Distance Ambulated (ft) 180 ft  Range of Motion/Exercises Active;All extremities  Activity Response Tolerated well  Mobility Referral Yes  $Mobility charge 1 Mobility  Mobility Specialist Start Time (ACUTE ONLY) 0930  Mobility Specialist Stop Time (ACUTE ONLY) 0940  Mobility Specialist Time Calculation (min) (ACUTE ONLY) 10 min   Pt received in chair, agreeable to mobility. Required supervision to stand and ambulate with RW. Tolerated well, asx throughout. Returned pt to chair, alarm on. Call bell in reach, all needs met.   Lawerance Bach Mobility Specialist Please contact via Special educational needs teacher or  Rehab office at [phone]

## 2022-12-04 NOTE — Progress Notes (Signed)
Pt completed bowel prep and  had multiple bowels movements, no obvious blood noted. She had episode of large  clear emesis ( no blood noted).  She became hypotensive and pressure remained soft after NS bolus. PO Metoprolol was held due to soft BP's.  She had one episode of dizziness this AM with enema administration when she stood. One enema administered. Pt unable to hold tap water enema. Terry Hanson

## 2022-12-05 DIAGNOSIS — I5022 Chronic systolic (congestive) heart failure: Secondary | ICD-10-CM | POA: Diagnosis not present

## 2022-12-05 DIAGNOSIS — E669 Obesity, unspecified: Secondary | ICD-10-CM | POA: Diagnosis not present

## 2022-12-05 DIAGNOSIS — I4729 Other ventricular tachycardia: Secondary | ICD-10-CM | POA: Diagnosis not present

## 2022-12-05 DIAGNOSIS — D649 Anemia, unspecified: Secondary | ICD-10-CM | POA: Diagnosis not present

## 2022-12-05 DIAGNOSIS — I4901 Ventricular fibrillation: Secondary | ICD-10-CM | POA: Diagnosis not present

## 2022-12-05 DIAGNOSIS — I2510 Atherosclerotic heart disease of native coronary artery without angina pectoris: Secondary | ICD-10-CM | POA: Diagnosis not present

## 2022-12-05 DIAGNOSIS — R55 Syncope and collapse: Secondary | ICD-10-CM | POA: Diagnosis not present

## 2022-12-05 DIAGNOSIS — I959 Hypotension, unspecified: Secondary | ICD-10-CM | POA: Diagnosis not present

## 2022-12-05 DIAGNOSIS — I4819 Other persistent atrial fibrillation: Secondary | ICD-10-CM | POA: Diagnosis not present

## 2022-12-05 LAB — TYPE AND SCREEN
ABO/RH(D): A NEG
Antibody Screen: NEGATIVE
Unit division: 0
Unit division: 0

## 2022-12-05 LAB — CULTURE, BLOOD (ROUTINE X 2)

## 2022-12-05 LAB — BPAM RBC
Blood Product Expiration Date: 202409222359
Blood Product Expiration Date: 202409222359
ISSUE DATE / TIME: 202409142005
Unit Type and Rh: 600
Unit Type and Rh: 600

## 2022-12-05 NOTE — Progress Notes (Signed)
Mobility Specialist Progress Note:    12/05/22 0945  Mobility  Activity Ambulated with assistance in hallway  Level of Assistance Standby assist, set-up cues, supervision of patient - no hands on  Assistive Device Front wheel walker  Distance Ambulated (ft) 350 ft  Range of Motion/Exercises Active;All extremities  Activity Response Tolerated well  Mobility Referral Yes  $Mobility charge 1 Mobility  Mobility Specialist Start Time (ACUTE ONLY) 0945  Mobility Specialist Stop Time (ACUTE ONLY) 1000  Mobility Specialist Time Calculation (min) (ACUTE ONLY) 15 min   Pt received in chair, eager for mobility. Required supervision to stand and ambulate with RW. Tolerated well, asx throughout. Returned pt to chair, all needs met.   Lawerance Bach Mobility Specialist Please contact via Special educational needs teacher or  Rehab office at [phone]

## 2022-12-05 NOTE — Progress Notes (Signed)
Progress Note  Patient Name: Terry Hanson Date of Encounter: 12/05/2022  Primary Cardiologist: Marjo Bicker, MD  Subjective   No acute events overnight, no symptoms.  No dizziness.  No recurrent ICD shocks.  Patient reported she is tired of waiting to be transferred.  Inpatient Medications    Scheduled Meds:  Chlorhexidine Gluconate Cloth  6 each Topical Daily   cyanocobalamin  1,000 mcg Intramuscular Daily   digoxin  0.0625 mg Oral Once per day on Monday Wednesday Friday   folic acid  1 mg Oral Daily   metoprolol succinate  50 mg Oral QHS   nystatin   Topical TID   pantoprazole (PROTONIX) IV  40 mg Intravenous Q12H   simvastatin  20 mg Oral QPM   Continuous Infusions:   PRN Meds: acetaminophen **OR** acetaminophen, ondansetron **OR** ondansetron (ZOFRAN) IV, mouth rinse   Vital Signs    Vitals:   12/04/22 1411 12/04/22 2004 12/05/22 0522 12/05/22 0858  BP: (!) 115/43 (!) 121/50 (!) 109/49 (!) 129/47  Pulse: 64 (!) 58 64 75  Resp: (!) 21 20 18 18   Temp: 98.1 F (36.7 C) 98.1 F (36.7 C) 97.9 F (36.6 C) 98.1 F (36.7 C)  TempSrc: Oral Oral Oral Oral  SpO2: 100% 100% 96% 100%  Weight:      Height:        Intake/Output Summary (Last 24 hours) at 12/05/2022 1050 Last data filed at 12/05/2022 0900 Gross per 24 hour  Intake 240 ml  Output --  Net 240 ml   Filed Weights   12/01/22 1609 12/01/22 2250 12/02/22 0500  Weight: 88 kg 94.1 kg 94.1 kg    Telemetry     Personally reviewed, frequent PVCs and ventricular bigeminy  ECG    Not performed today  Physical Exam   GEN: No acute distress.   Neck: No JVD. Cardiac: RRR, no murmur, rub, or gallop.  Respiratory: Nonlabored. Clear to auscultation bilaterally. GI: Soft, nontender, bowel sounds present. MS: No edema; No deformity. Neuro:  Nonfocal. Psych: Alert and oriented x 3. Normal affect.  Labs    Chemistry Recent Labs  Lab 12/01/22 2215 12/02/22 0012 12/02/22 0417 12/03/22 0506  12/04/22 0838  NA 134* 134* 133* 135 134*  K 6.2* 6.0* 4.8 4.6 4.3  CL 105 104 104 106 103  CO2 22 21* 21* 20* 20*  GLUCOSE 154* 130* 108* 148* 91  BUN 47* 46* 48* 41* 37*  CREATININE 2.43* 2.31* 2.18* 2.12* 2.13*  CALCIUM 8.8* 8.7* 8.4* 8.7* 8.6*  PROT 6.6 6.3* 6.0*  --   --   ALBUMIN 3.6 3.3* 3.2*  --   --   AST 21 19 15   --   --   ALT 19 18 16   --   --   ALKPHOS 25* 23* 21*  --   --   BILITOT 0.7 1.1 0.9  --   --   GFRNONAA 20* 21* 22* 23* 23*  ANIONGAP 7 9 8 9 11      Hematology Recent Labs  Lab 12/02/22 0417 12/03/22 0506 12/04/22 0838  WBC 7.5 10.4 7.1  RBC 3.54* 3.78* 3.40*  HGB 11.4* 11.9* 11.0*  HCT 34.2* 37.2 33.3*  MCV 96.6 98.4 97.9  MCH 32.2 31.5 32.4  MCHC 33.3 32.0 33.0  RDW 14.7 14.5 14.5  PLT 163 143* 142*    Cardiac Enzymes Recent Labs  Lab 12/01/22 1630 12/01/22 1738 12/01/22 2215 12/02/22 0012  TROPONINIHS 7 32* 85* 80*  BNPNo results for input(s): "BNP", "PROBNP" in the last 168 hours.   DDimerNo results for input(s): "DDIMER" in the last 168 hours.   Radiology    No results found.   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  No VT.  Asymptomatic.  No recurrence of ICD shocks.  Continue metoprolol succinate 50 mg once daily.  Did not tolerate amiodarone in the past due to nausea and vomiting. She will need invasive ischemia evaluation and GI workup.  Per EP, she might need EP study to rule out accessory pathway. EP recommended all procedures to be performed at [facility]. Patient is waiting on transfer to [facility].  She will need to be followed by EP and advanced heart failure after transfer. -Continue GDMT for  ischemic cardiomyopathy, Entresto and spironolactone held due to soft blood pressures.    Signed, Marjo Bicker, MD  12/05/2022, 10:50 AM

## 2022-12-05 NOTE — Progress Notes (Signed)
Progress Note   Patient: Terry Hanson ZOX:[id number] DOB: 01/01/1941 DOA: 12/01/2022     3 DOS: the patient was seen and examined on 12/05/2022   Brief hospital admission narrative: As per H&P written by Dr. Thomes Dinning on 12/01/2022 Terry Hanson is a 82 y.o. female with medical history significant of chronic systolic CHF, atrial fibrillation, CAD s/p stent placement (DES to proximal LAD 06/2019), CKD 3 who presents to the emergency department from home via EMS due to syncope.  Patient's daughter at bedside states that she heard a large thud coming from patient's bedroom, she quickly went to check on patient and saw her on the floor with a gurgling sound, she tried to wake her up when she noted the patient stopped breathing, she quickly called EMS and was told to start CPR.  After fifth round of chest compressions, she heard a loud popping sound from her chest and patient woke up.  EMS team transported patient to the ED for further evaluation and management.  Patient was alert and oriented x 3 on arrival to the ED.  Patient complained of about 3-week onset of generalized weakness and an occasional nonproductive mild cough.  She denies chest pain, shortness of breath, fever, chills, nausea, vomiting.   ED Course:  In the emergency department, BP on arrival was 93/49, respiratory rate was 27/min, other vital signs were within normal range.  Workup in the ED showed hemoglobin of 6.4, hematocrit 20.5, MCV 100.5, platelets 102.  BMP showed potassium 2.2, chloride 123, bicarb 11, BUN 26, creatinine 1.13, calcium 4.1, GFR 49.  Magnesium 1.1, total CK21, FOBT was negative, troponin 7 > 32.  Influenza A, B, SARS coronavirus 2, RSV was negative.  Blood culture pending. CT head without contrast showed no acute intracranial abnormality CT cervical spine without contrast showed no fracture or acute finding CT abdomen and pelvis showed diverticulosis without diverticulitis. Chest x-ray showed no active disease IV hydration  was provided, calcium, magnesium and potassium were replenished.  2 units of packed red blood cells were ordered to be transfused in the ED.  Hospitalist was asked to admit patient for further evaluation and management.  Assessment and Plan: Syncope event -Following ICD interrogation patient's syncope associated with arrhythmia and ICD firing at home. -Continue telemetry monitoring -Continue electrolyte repletion and follow trend/stability. -Cardiology consulted; follow recommendations patient will be transferred to [facility] for further evaluation and management by electrophysiology's and cardiology team (patient will require left heart cath evaluation).  Systolic congestive heart failure -Chronic and currently compensated -Continue to follow daily weights and strict I's and O's -Will continue treatment with metoprolol and digoxin -continue Holding Aldactone, Lasix and Entresto at the moment. -Follow echo results. -cardiology consulted and will follow rec's.  Hyperlipidemia -Continue statin -Heart healthy diet discussed with patient.  Class 2 obesity Body mass index is 37.94 kg/m.  -Control and increase physical activity discussed with patient.  Hypertension -improved after holding antihypertensive agents. -continue Holding Entresto, Aldactone and Lasix -Follow vital signs. 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 -will continue Holding nephrotoxic agents temporarily -continue Maintaining adequate hydration -Will continue to follow-up renal function trend and electrolytes stability. -Avoid hypotension and minimize the use of contrast as much as possible. -In anticipation of needing heart cath gentle hydration provided following cardiology service recommendation.  Elevated troponin -Most likely demand ischemia in the setting of chest compression and  worsening renal function -No acute ischemic changes on telemetry or EKG -Continue close monitoring. -cardiology consulted; will follow rec's -echo demonstrating: EF 20-25%, left ventricle with very decreased function, mild to moderate aortic valve stenosis, positive wall motion abnormalities (inferior segment and apex akinetic; anterior wall entire lateral wall, entire septum and inferior wall hypokinetic), And grade 3 diastolic dysfunction.  History of atrial fibrillation -Appears to be paroxysmal -cardiology consulted for clearance frior to GI procedures and for ICD interrogation. -Continue metoprolol and digoxin for rate control -continue Holding Eliquis with concerns for bright red blood per rectum/anemia -Continue telemetry monitoring.  Anemia/GI bleed -Unclear -Bright red blood per rectum appreciated while hospitalized and at time of admission low hemoglobin present. -Status post 2 unit PRBCs transfused -No further overt bleeding reported; hemoglobin 11.0 currently. -Continue to follow hemoglobin trend and transfuse as needed. -GI service has been consulted to assist with any endoscopic evaluation procedures; given decreased ejection fraction cardiology/AP has recommended procedures to be done at [facility] in order to be able to provide any support needed. -Patient will be transfer on 12/04/2022 to telemetry bed at [facility]. -Continue holding Eliquis at the moment and continue PPI twice a day.  Macrocytosis/B12 deficiency -B12 level 153 -(Planning for intramuscular B12 daily for 7 days, then weekly x 1 month and then monthly after that). -Continue supportive care and follow levels in the outpatient setting. -Repeat B12 level as an outpatient in 12 weeks.   Subjective:  No fever, no chest pain, no nausea, no vomiting, no shortness of breath.  Patient reports no overt bleeding.  Tired of waiting to be transferred to [facility] for further evaluation and management.  Physical  Exam: Vitals:   12/05/22 0522 12/05/22 0858 12/05/22 1123 12/05/22 1300  BP: (!) 109/49 (!) 129/47 134/82 105/83  Pulse: 64 75 68   Resp: 18 18  18   Temp: 97.9 F (36.6 C) 98.1 F (36.7 C)  98.4 F (36.9 C)  TempSrc: Oral Oral  Oral  SpO2: 96% 100%  100%  Weight:      Height:       General exam: Alert, awake, oriented x 3; no chest pain, no nausea, no vomiting.  Patient reports no palpitations or chest pain at the moment.  No overt bleeding seen. Respiratory system: Clear to auscultation. Respiratory effort normal.  Good saturation on room air. Cardiovascular system: Rate controlled, no rubs, no gallops, no JVD. Gastrointestinal system: Abdomen is obese, nondistended, soft and nontender. No organomegaly or masses felt. Normal bowel sounds heard. Central nervous system:  No focal neurological deficits. Extremities: No cyanosis or clubbing; no edema. Skin: No petechiae. Psychiatry: Judgement and insight appear normal. Mood & affect appropriate.   Latest data Reviewed: CBC: White blood cell 7.1, hemoglobin 11.0 and platelet count 142K Basic metabolic panel: Sodium 134, potassium 4.3, chloride 103, bicarb 20, BUN 37, creatinine 2.13, GFR 23 Magnesium: 2.6 B12: 153  Family Communication: No family at bedside. Daughter updated over the phone (12/04/22).  Disposition: Status is: Patient. The patient will require care spanning > 2 midnights and should be moved to inpatient because: Continue to follow hemoglobin trend and furtheras needed.  Patient with bright red blood per rectum and a GI service has been involved.  Continue telemetry monitoring; continue to hold Eliquis, continue PPI twice a day.  Patient is pending transfer to [facility] for GI workup and further evaluation and management by cardiology service/EP including left heart cath and further studies by EP to rule out  accessory pathway.   Planned Discharge Destination: Home with Home Health  Time spent: 50  minutes  Author: Vassie Loll, MD 12/05/2022 3:54 PM  For on call review [email or web address].

## 2022-12-05 NOTE — Progress Notes (Signed)
Physical Therapy Treatment Patient Details Name: Terry Hanson MRN: [id number] DOB: 04-06-1940 Today's Date: 12/05/2022   History of Present Illness Terry Hanson is a 82 y.o. female with medical history significant of chronic systolic CHF, atrial fibrillation, CAD s/p stent placement (DES to proximal LAD 06/2019), CKD 3 who presents to the emergency department from home via EMS due to syncope.  Patient's daughter at bedside states that she heard a large thud coming from patient's bedroom, she quickly went to check on patient and saw her on the floor with a gurgling sound, she tried to wake her up when she noted the patient stopped breathing, she quickly called EMS and was told to start CPR.  After fifth round of chest compressions, she heard a loud popping sound from her chest and patient woke up.  EMS team transported patient to the ED for further evaluation and management.  Patient was alert and oriented x 3 on arrival to the ED.  Patient complained of about 3-week onset of generalized weakness and an occasional nonproductive mild cough.  She denies chest pain, shortness of breath, fever, chills, nausea, vomiting.    PT Comments  PT is currently I or mod I in all activity.  Skilled PT will sign off.  Mobility specialist will continue to see pt     If plan is discharge home, recommend the following:  none   Can travel by private vehicle      yes  Equipment Recommendations  None recommended by PT    Recommendations for Other Services  none     Precautions / Restrictions Precautions Precautions: None Restrictions Weight Bearing Restrictions: No     Mobility  Bed Mobility Overal bed mobility: Independent                  Transfers Overall transfer level: Independent                      Ambulation/Gait Ambulation/Gait assistance: Modified independent (Device/Increase time) Gait Distance (Feet): 200 Feet Assistive device: Rolling walker (2 wheels) Gait  Pattern/deviations: Trunk flexed   Gait velocity interpretation: >2.62 ft/sec, indicative of community ambulatory             Cognition Arousal: Alert Behavior During Therapy: WFL for tasks assessed/performed Overall Cognitive Status: Within Functional Limits for tasks assessed                                                 Pertinent Vitals/Pain Pain Assessment Pain Assessment: 0-10           PT Goals (current goals can now be found in the care plan section) Acute Rehab PT Goals Patient Stated Goal: to return home Time For Goal Achievement: 12/16/22 Progress towards PT goals: Goals met/education completed, patient discharged from PT    Frequency    Min 2X/week              AM-PAC PT "6 Clicks" Mobility   Outcome Measure  Help needed turning from your back to your side while in a flat bed without using bedrails?: None Help needed moving from lying on your back to sitting on the side of a flat bed without using bedrails?: None Help needed moving to and from a bed to a chair (including a wheelchair)?: None Help needed standing up from a chair  using your arms (e.g., wheelchair or bedside chair)?: None Help needed to walk in hospital room?: None Help needed climbing 3-5 steps with a railing? : A Little 6 Click Score: 23    End of Session Equipment Utilized During Treatment: Gait belt Activity Tolerance: Patient tolerated treatment well;Patient limited by fatigue Patient left: in chair;with bed alarm set;with call bell/phone within reach Nurse Communication: Mobility status PT Visit Diagnosis: Unsteadiness on feet (R26.81)     Time: 1610-9604 PT Time Calculation (min) (ACUTE ONLY): 12 min  Charges:    $Gait Training: 8-22 mins PT General Charges $$ ACUTE PT VISIT: 1 Visit                    Virgina Organ, PT CLT [phone]  12/05/2022, 11:52 AM

## 2022-12-05 NOTE — Progress Notes (Signed)
Briefly, chart reviewed, patient with Bright red blood per rectum appreciated upon admission, also with anemia. She is s/p transfusion of 2 Units PRBCs. Overt bleeding subsided.   Our team was consulted for further evaluation via endoscopic procedures though given decreased EF, recommended to have procedures done at [facility] for access to higher level of care of needed, eliquis continues to be on hold, should continue PPI BID. Awaiting transfer to [facility] currently. Hgb stable at 11.   GI will continue to follow peripherally while patient remains at [facility]. Jeanmarie Hubert, MSN, APRN, AGNP-C Adult-Gerontology Nurse Practitioner [facility] at [facility]

## 2022-12-06 ENCOUNTER — Telehealth: Payer: Self-pay | Admitting: Cardiology

## 2022-12-06 DIAGNOSIS — D539 Nutritional anemia, unspecified: Secondary | ICD-10-CM | POA: Diagnosis not present

## 2022-12-06 DIAGNOSIS — E669 Obesity, unspecified: Secondary | ICD-10-CM | POA: Diagnosis not present

## 2022-12-06 DIAGNOSIS — I959 Hypotension, unspecified: Secondary | ICD-10-CM | POA: Diagnosis not present

## 2022-12-06 DIAGNOSIS — I2510 Atherosclerotic heart disease of native coronary artery without angina pectoris: Secondary | ICD-10-CM | POA: Diagnosis not present

## 2022-12-06 DIAGNOSIS — I5022 Chronic systolic (congestive) heart failure: Secondary | ICD-10-CM | POA: Diagnosis not present

## 2022-12-06 DIAGNOSIS — I4819 Other persistent atrial fibrillation: Secondary | ICD-10-CM | POA: Diagnosis not present

## 2022-12-06 DIAGNOSIS — I4901 Ventricular fibrillation: Secondary | ICD-10-CM | POA: Diagnosis not present

## 2022-12-06 DIAGNOSIS — I4729 Other ventricular tachycardia: Secondary | ICD-10-CM | POA: Diagnosis not present

## 2022-12-06 DIAGNOSIS — R55 Syncope and collapse: Secondary | ICD-10-CM | POA: Diagnosis not present

## 2022-12-06 LAB — CBC
HCT: 33.9 % — ABNORMAL LOW (ref 36.0–46.0)
Hemoglobin: 10.7 g/dL — ABNORMAL LOW (ref 12.0–15.0)
MCH: 30.8 pg (ref 26.0–34.0)
MCHC: 31.6 g/dL (ref 30.0–36.0)
MCV: 97.7 fL (ref 80.0–100.0)
Platelets: 153 10*3/uL (ref 150–400)
RBC: 3.47 MIL/uL — ABNORMAL LOW (ref 3.87–5.11)
RDW: 14 % (ref 11.5–15.5)
WBC: 5.6 10*3/uL (ref 4.0–10.5)
nRBC: 0 % (ref 0.0–0.2)

## 2022-12-06 LAB — BASIC METABOLIC PANEL
Anion gap: 9 (ref 5–15)
BUN: 23 mg/dL (ref 8–23)
CO2: 20 mmol/L — ABNORMAL LOW (ref 22–32)
Calcium: 8.8 mg/dL — ABNORMAL LOW (ref 8.9–10.3)
Chloride: 106 mmol/L (ref 98–111)
Creatinine, Ser: 1.72 mg/dL — ABNORMAL HIGH (ref 0.44–1.00)
GFR, Estimated: 30 mL/min — ABNORMAL LOW (ref >60)
Glucose, Bld: 93 mg/dL (ref 70–99)
Potassium: 4.4 mmol/L (ref 3.5–5.1)
Sodium: 135 mmol/L (ref 135–145)

## 2022-12-06 LAB — MAGNESIUM: Magnesium: 2.3 mg/dL (ref 1.7–2.4)

## 2022-12-06 LAB — GLUCOSE, CAPILLARY
Glucose-Capillary: 116 mg/dL — ABNORMAL HIGH (ref 70–99)
Glucose-Capillary: 139 mg/dL — ABNORMAL HIGH (ref 70–99)

## 2022-12-06 MED ORDER — SACUBITRIL-VALSARTAN 24-26 MG PO TABS
1.0000 | ORAL_TABLET | Freq: Two times a day (BID) | ORAL | Status: DC
  Administered 2022-12-06 – 2022-12-09 (×7): 1 via ORAL
  Filled 2022-12-06 (×7): qty 1

## 2022-12-06 MED ORDER — MELATONIN 5 MG PO TABS
5.0000 mg | ORAL_TABLET | Freq: Every evening | ORAL | Status: DC | PRN
  Administered 2022-12-06 – 2022-12-07 (×2): 5 mg via ORAL
  Filled 2022-12-06 (×2): qty 1

## 2022-12-06 NOTE — Plan of Care (Signed)
Problem: Nutrition: Goal: Adequate nutrition will be maintained Outcome: Progressing   Problem: Safety: Goal: Ability to remain free from injury will improve Outcome: Progressing

## 2022-12-06 NOTE — Progress Notes (Signed)
Patient being transported to [facility] for further evaluation and management by electrophysiology's cardiology team. Patient will be going to [address] room 6c,report called and given to [facility] R.RN. [facility] to transport patient to awaiting facility. Plan of care on going.

## 2022-12-06 NOTE — Progress Notes (Addendum)
Progress Note  Patient Name: Terry Hanson Date of Encounter: 12/06/2022  Primary Cardiologist: Marjo Bicker, MD  Subjective   No acute events overnight, no symptoms.  On liquid diet, discontinued and placed diet orders.  Patient upset, wants to go home.  Inpatient Medications    Scheduled Meds:  cyanocobalamin  1,000 mcg Intramuscular Daily   digoxin  0.0625 mg Oral Once per day on Monday Wednesday Friday   folic acid  1 mg Oral Daily   metoprolol succinate  50 mg Oral QHS   nystatin   Topical TID   pantoprazole (PROTONIX) IV  40 mg Intravenous Q12H   simvastatin  20 mg Oral QPM   Continuous Infusions:   PRN Meds: acetaminophen **OR** acetaminophen, ondansetron **OR** ondansetron (ZOFRAN) IV, mouth rinse   Vital Signs    Vitals:   12/05/22 1300 12/05/22 1953 12/06/22 0449 12/06/22 1048  BP: 105/83 (!) 134/45 (!) 147/68 (!) 131/45  Pulse:  74 70 63  Resp: 18 20 18    Temp: 98.4 F (36.9 C) 98.7 F (37.1 C) 98.3 F (36.8 C) 98.7 F (37.1 C)  TempSrc: Oral  Oral Oral  SpO2: 100% 100% 99% 98%  Weight:      Height:        Intake/Output Summary (Last 24 hours) at 12/06/2022 1107 Last data filed at 12/06/2022 0900 Gross per 24 hour  Intake 600 ml  Output --  Net 600 ml   Filed Weights   12/01/22 1609 12/01/22 2250 12/02/22 0500  Weight: 88 kg 94.1 kg 94.1 kg    Telemetry     Personally reviewed, frequent PVCs and ventricular bigeminy  ECG    Not performed today  Physical Exam   GEN: No acute distress.   Neck: No JVD. Cardiac: RRR, no murmur, rub, or gallop.  Respiratory: Nonlabored. Clear to auscultation bilaterally. GI: Soft, nontender, bowel sounds present. MS: No edema; No deformity. Neuro:  Nonfocal. Psych: Alert and oriented x 3. Normal affect.  Labs    Chemistry Recent Labs  Lab 12/01/22 2215 12/02/22 0012 12/02/22 0417 12/03/22 0506 12/04/22 0838 12/06/22 0432  NA 134* 134* 133* 135 134* 135  K 6.2* 6.0* 4.8 4.6 4.3 4.4   CL 105 104 104 106 103 106  CO2 22 21* 21* 20* 20* 20*  GLUCOSE 154* 130* 108* 148* 91 93  BUN 47* 46* 48* 41* 37* 23  CREATININE 2.43* 2.31* 2.18* 2.12* 2.13* 1.72*  CALCIUM 8.8* 8.7* 8.4* 8.7* 8.6* 8.8*  PROT 6.6 6.3* 6.0*  --   --   --   ALBUMIN 3.6 3.3* 3.2*  --   --   --   AST 21 19 15   --   --   --   ALT 19 18 16   --   --   --   ALKPHOS 25* 23* 21*  --   --   --   BILITOT 0.7 1.1 0.9  --   --   --   GFRNONAA 20* 21* 22* 23* 23* 30*  ANIONGAP 7 9 8 9 11 9      Hematology Recent Labs  Lab 12/03/22 0506 12/04/22 0838 12/06/22 0432  WBC 10.4 7.1 5.6  RBC 3.78* 3.40* 3.47*  HGB 11.9* 11.0* 10.7*  HCT 37.2 33.3* 33.9*  MCV 98.4 97.9 97.7  MCH 31.5 32.4 30.8  MCHC 32.0 33.0 31.6  RDW 14.5 14.5 14.0  PLT 143* 142* 153    Cardiac Enzymes Recent Labs  Lab 12/01/22 1630  12/01/22 1738 12/01/22 2215 12/02/22 0012  TROPONINIHS 7 32* 85* 80*    BNPNo results for input(s): "BNP", "PROBNP" in the last 168 hours.   DDimerNo results for input(s): "DDIMER" in the last 168 hours.   Radiology    No results found.   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 Daughter upset the patient has not been transferred to [facility] yet.  Reached out to [facility], probably/hopefully will have a bed today. -Overnight telemetry reviewed, frequent PVCs and ventricular bigeminy noted.  No VT.  Asymptomatic.  No recurrence of ICD shocks.  Continue metoprolol succinate 50 mg once daily.  Did not tolerate amiodarone in the past due to nausea and vomiting. She will need invasive ischemia evaluation and GI workup.  Per EP, she might need EP study to rule out accessory pathway. EP  recommended all procedures to be performed at [facility]. Patient is waiting on transfer to [facility].  She will need to be followed by EP and advanced heart failure after transfer. -Continue GDMT for ischemic cardiomyopathy, Entresto and spironolactone held previously due to soft pressures, can resume Entresto today.    Signed, Marjo Bicker, MD  12/06/2022, 11:07 AM

## 2022-12-06 NOTE — Telephone Encounter (Signed)
Patient's daughter called the answering service this AM reporting that her mother has been at [facility] awaiting transfer to [facility] since Tuesday. Patient has been on a liquid diet, and is very upset that she is still waiting transfer. Patient told her daughter that she wanted to go home. Asked if there was anything we could do to get her transferred quickly.   I explained that the internal medicine team at [facility] had put in a transfer request, but there are no beds available at [facility]. I explained that we cannot transfer her until bed is open. I told her I would reach out to her team at [facility] to let them know of patient's frustrations.  I messaged the attending and cardiology MD/PA at [facility].   Jonita Albee, PA-C 12/06/2022 8:06 AM

## 2022-12-06 NOTE — Progress Notes (Signed)
Progress Note   Patient: Terry Hanson YNW:[id number] DOB: 05-Jun-1940 DOA: 12/01/2022     4 DOS: the patient was seen and examined on 12/06/2022   Brief hospital admission narrative: As per H&P written by Dr. Thomes Dinning on 12/01/2022 Chizara Woehrle Esses is a 82 y.o. female with medical history significant of chronic systolic CHF, atrial fibrillation, CAD s/p stent placement (DES to proximal LAD 06/2019), CKD 3 who presents to the emergency department from home via EMS due to syncope.  Patient's daughter at bedside states that she heard a large thud coming from patient's bedroom, she quickly went to check on patient and saw her on the floor with a gurgling sound, she tried to wake her up when she noted the patient stopped breathing, she quickly called EMS and was told to start CPR.  After fifth round of chest compressions, she heard a loud popping sound from her chest and patient woke up.  EMS team transported patient to the ED for further evaluation and management.  Patient was alert and oriented x 3 on arrival to the ED.  Patient complained of about 3-week onset of generalized weakness and an occasional nonproductive mild cough.  She denies chest pain, shortness of breath, fever, chills, nausea, vomiting.   ED Course:  In the emergency department, BP on arrival was 93/49, respiratory rate was 27/min, other vital signs were within normal range.  Workup in the ED showed hemoglobin of 6.4, hematocrit 20.5, MCV 100.5, platelets 102.  BMP showed potassium 2.2, chloride 123, bicarb 11, BUN 26, creatinine 1.13, calcium 4.1, GFR 49.  Magnesium 1.1, total CK21, FOBT was negative, troponin 7 > 32.  Influenza A, B, SARS coronavirus 2, RSV was negative.  Blood culture pending. CT head without contrast showed no acute intracranial abnormality CT cervical spine without contrast showed no fracture or acute finding CT abdomen and pelvis showed diverticulosis without diverticulitis. Chest x-ray showed no active disease IV hydration  was provided, calcium, magnesium and potassium were replenished.  2 units of packed red blood cells were ordered to be transfused in the ED.  Hospitalist was asked to admit patient for further evaluation and management.  Assessment and Plan: Syncope event -Following ICD interrogation patient's syncope associated with arrhythmia and ICD firing at home. -Continue telemetry monitoring -Continue electrolyte repletion and follow trend/stability. -Cardiology consulted; follow recommendations patient will be transferred to [facility] for further evaluation and management by electrophysiology's and cardiology team (patient will require left heart cath evaluation).  Systolic congestive heart failure -Chronic and currently compensated -Continue to follow daily weights and strict I's and O's -Will continue treatment with metoprolol and digoxin -continue Holding Aldactone, Lasix and Entresto at the moment. -Follow echo results. -cardiology consulted and will follow rec's.  Hyperlipidemia -Continue statin -Heart healthy diet discussed with patient.  Class 2 obesity Body mass index is 37.94 kg/m.  -Control and increase physical activity discussed with patient.  Hypertension -improved after holding antihypertensive agents. -continue Holding Entresto, Aldactone and Lasix -Follow vital signs. 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 -will continue Holding nephrotoxic agents temporarily -continue Maintaining adequate hydration -Will continue to follow-up renal function trend and electrolytes stability. -Avoid hypotension and minimize the use of contrast as much as possible. -In anticipation of needing heart cath gentle hydration provided following cardiology service recommendation.  Elevated troponin -Most likely demand ischemia in the setting of chest compression and  worsening renal function -No acute ischemic changes on telemetry or EKG -Continue close monitoring. -cardiology consulted; will follow rec's -echo demonstrating: EF 20-25%, left ventricle with very decreased function, mild to moderate aortic valve stenosis, positive wall motion abnormalities (inferior segment and apex akinetic; anterior wall entire lateral wall, entire septum and inferior wall hypokinetic), And grade 3 diastolic dysfunction.  History of atrial fibrillation -Appears to be paroxysmal -cardiology consulted for clearance frior to GI procedures and for ICD interrogation. -Continue metoprolol and digoxin for rate control -continue Holding Eliquis with concerns for bright red blood per rectum/anemia -Continue telemetry monitoring.  Anemia/GI bleed -Unclear -Bright red blood per rectum appreciated while hospitalized and at time of admission low hemoglobin present. -Status post 2 unit PRBCs transfused -No further overt bleeding reported; hemoglobin 11.0 currently. -Continue to follow hemoglobin trend and transfuse as needed. -GI service has been consulted to assist with any endoscopic evaluation procedures; given decreased ejection fraction cardiology/AP has recommended procedures to be done at [facility] in order to be able to provide any support needed. -Patient will be transfer on 12/04/2022 to telemetry bed at [facility]. -Continue holding Eliquis at the moment and continue PPI twice a day.  Macrocytosis/B12 deficiency -B12 level 153 -(Planning for intramuscular B12 daily for 7 days, then weekly x 1 month and then monthly after that). -Continue supportive care and follow levels in the outpatient setting. -Repeat B12 level as an outpatient in 12 weeks.   Subjective:  Afebrile, no chest pain, no nausea, no vomiting, no shortness of breath.  Patient reports no palpitations.  No overt bleeding.  Physical Exam: Vitals:   12/05/22 1953 12/06/22 0449 12/06/22 1048 12/06/22  1422  BP: (!) 134/45 (!) 147/68 (!) 131/45 (!) 147/48  Pulse: 74 70 63 73  Resp: 20 18  17   Temp: 98.7 F (37.1 C) 98.3 F (36.8 C) 98.7 F (37.1 C) 98.2 F (36.8 C)  TempSrc:  Oral Oral Oral  SpO2: 100% 99% 98% 100%  Weight:      Height:       General exam: Alert, awake, oriented x 3; expressing to be tired and frustrated of waiting to be transferred for further management.  No chest pain, no nausea, no vomiting. Respiratory system: Clear to auscultation. Respiratory effort normal.  Good saturation on room air. Cardiovascular system: Rate controlled, no rubs, no gallops, denies palpitation. Gastrointestinal system: Abdomen is obese, nondistended, soft and nontender. No organomegaly or masses felt. Normal bowel sounds heard. Central nervous system: Alert and oriented. No focal neurological deficits. Extremities: No cyanosis, clubbing or edema. Skin: No petechiae. Psychiatry: Judgement and insight appear normal. Mood & affect appropriate.   Latest data Reviewed: CBC: White blood cell 5.6, hemoglobin 10.7 and platelet count 153K. Basic metabolic panel: Sodium 135, potassium 4.4, chloride 106, bicarb 20, BUN 23, creatinine 1.72 and GFR 30 Magnesium: 2.3 B12: 153  Family Communication: No family at bedside. Daughter updated at bedside on 12/05/2022.  Disposition: Status is: Patient. The patient will require care spanning > 2 midnights and should be moved to inpatient because: Continue to follow hemoglobin trend and furtheras needed.  Patient with bright red blood per rectum and a GI service has been involved.  Continue telemetry monitoring; continue to hold Eliquis, continue PPI twice a day.  Patient is pending transfer to [facility] for GI workup and further evaluation and management by cardiology service/EP including left heart cath and further studies by EP to rule out accessory pathway.   Planned Discharge  Destination: Home with Home Health  Time spent: 50  minutes  Author: Vassie Loll, MD 12/06/2022 3:16 PM  For on call review [email or web address].

## 2022-12-06 NOTE — Plan of Care (Signed)
TRANSESOPHAGEAL ECHOCARDIOGRAM GUIDED DIRECT CURRENT CARDIOVERSION  NAME:  Terry Hanson    MRN: [id number] DOB:  08-28-1940    ADMIT DATE: 12/01/2022  Spoke with Dr. Lalla Brothers, EP at [facility], who stated that patient cannot be transferred to the EP lab directly.  Likewise, patient cannot be transferred to [facility] directly due to pending GI workup.  Mattye Verdone Verne Spurr, MD [place]  [facility]  11:12 AM

## 2022-12-06 NOTE — Plan of Care (Signed)

## 2022-12-07 DIAGNOSIS — R55 Syncope and collapse: Secondary | ICD-10-CM | POA: Diagnosis not present

## 2022-12-07 LAB — BASIC METABOLIC PANEL
Anion gap: 5 (ref 5–15)
BUN: 22 mg/dL (ref 8–23)
CO2: 20 mmol/L — ABNORMAL LOW (ref 22–32)
Calcium: 8.5 mg/dL — ABNORMAL LOW (ref 8.9–10.3)
Chloride: 112 mmol/L — ABNORMAL HIGH (ref 98–111)
Creatinine, Ser: 2.09 mg/dL — ABNORMAL HIGH (ref 0.44–1.00)
GFR, Estimated: 23 mL/min — ABNORMAL LOW (ref >60)
Glucose, Bld: 123 mg/dL — ABNORMAL HIGH (ref 70–99)
Potassium: 4.5 mmol/L (ref 3.5–5.1)
Sodium: 137 mmol/L (ref 135–145)

## 2022-12-07 LAB — CBC
HCT: 32.9 % — ABNORMAL LOW (ref 36.0–46.0)
Hemoglobin: 10.8 g/dL — ABNORMAL LOW (ref 12.0–15.0)
MCH: 32.4 pg (ref 26.0–34.0)
MCHC: 32.8 g/dL (ref 30.0–36.0)
MCV: 98.8 fL (ref 80.0–100.0)
Platelets: 159 10*3/uL (ref 150–400)
RBC: 3.33 MIL/uL — ABNORMAL LOW (ref 3.87–5.11)
RDW: 14.2 % (ref 11.5–15.5)
WBC: 5.8 10*3/uL (ref 4.0–10.5)
nRBC: 0 % (ref 0.0–0.2)

## 2022-12-07 NOTE — Progress Notes (Signed)
PROGRESS NOTE    ANAPATRICIA PLUEGER  MVH:[id number] DOB: 12-Nov-1940 DOA: 12/01/2022 PCP: Pcp, No   Brief Narrative:  AKYLA HAVERFIELD is a 82 y.o. female with medical history significant of chronic systolic CHF, atrial fibrillation, CAD s/p stent placement (DES to proximal LAD 06/2019), CKD 3 who presents to the emergency department from home via EMS due to syncope -CPR performed in the field, ROSC after 5 rounds per report.  Admitted to hospitalist service for further evaluation treatment of questionable cardiac event, cardiology called to consult.  At some point during intake patient had notable bright red blood per rectum concerning for GI bleed -as such GI was called in consult to help rule out any ongoing active bleed.  Patient's home Eliquis has been held.  Assessment & Plan:   Principal Problem:   Cardiac syncope Active Problems:   Chronic systolic CHF (congestive heart failure) (HCC)   CAD (coronary artery disease)   Stage 3 chronic kidney disease (HCC)   Polymorphic ventricular tachycardia (HCC)   Fall at home, initial encounter   Hypomagnesemia   Hypokalemia   Hypocalcemia   Symptomatic anemia   Thrombocytopenia (HCC)   Macrocytic anemia   Persistent atrial fibrillation (HCC)   Obesity (BMI 30-39.9)   Anemia   Ventricular fibrillation (HCC)  Syncopal event, consistent with PMVT> V-fib requiring ICD shock -Cardiology following, appreciate insight and recommendations -EP to evaluate for possible procedure versus medication management -possible need for left heart cath -No further issues or events, continue telemetry, ambulate as appropriate, replete electrolytes as necessary   Systolic congestive heart failure -Chronic, compensated, appears euvolemic  -follow I's and O's  -Continue home metoprolol, hold home Aldactone, Lasix, Entresto. Digoxin held this morning due to hypotension -defer to cardiology for further adjustment in medication -Echo pending  Questionable GI bleed, acute,  transient - Bright red blood noted per rectum at prior facility  - Status post 1 unit PRBC 9/14 - Hemoglobin remains quite stable, no further events of bright red blood or black stool or other signs or symptoms of bleeding - GI consulted, appreciate insight recommendations, unclear if patient requires further endoscopy or close monitoring given stable hemoglobin  Hyperlipidemia -Continue statin -Heart healthy diet discussed with patient.   Morbid obesity Body mass index is 37.33 kg/m. -Control and increase physical activity discussed with patient.   Hypertension -currently hypotensive -Improving after cessation of Entresto Aldactone and Lasix, digoxin also held this morning in the setting of hypotension and bradycardia  AKI on CKD 3B -Baseline creatinine around 1.7 -Creatinine downtrending appropriately currently 2.1   Elevated troponin -Likely demand ischemia in the setting of above, no further EKG changes -Cardiology following; labs and echo as above  Paroxysmal atrial fibrillation history -Continue metoprolol  -digoxin held this morning due to bradycardia -Anticoagulation on hold in the setting of questionable GI bleed   Macrocytosis/B12 deficiency -IM B12 daily for 7 days, then weekly x 1 month and then monthly after that. -Continue supportive care and follow levels in the outpatient setting. -Repeat B12 level as an outpatient in 12 weeks.  DVT prophylaxis: Place and maintain sequential compression device Start: 12/04/22 1248 SCDs Start: 12/01/22 2215   Code Status:   Code Status: Full Code  Family Communication: At bedside  Status is: Inpatient  Dispo: The patient is from: Home              Anticipated d/c is to: Home  Anticipated d/c date is: 48 to 72 hours pending procedures as above              Patient currently not medically stable for discharge  Consultants:  GI, cardiology/EP  Procedures:  Pending as above  Antimicrobials:   None  Subjective: No acute issues or events overnight denies any further black stool melena or bright red blood per rectum.  Denies nausea vomiting headache fever chills chest pain shortness of breath.  Objective: Vitals:   12/06/22 1808 12/06/22 2203 12/07/22 0018 12/07/22 0433  BP: (!) 146/67 (!) 148/72 (!) 124/52 (!) 119/51  Pulse: 73 69 64 61  Resp: 20 18 16 19   Temp: 98.6 F (37 C) 98.6 F (37 C) 98.6 F (37 C) 98.6 F (37 C)  TempSrc: Oral Oral Oral Oral  SpO2:  100% 97% 99%  Weight: 92.6 kg     Height: 5\' 2"  (1.575 m)       Intake/Output Summary (Last 24 hours) at 12/07/2022 0746 Last data filed at 12/06/2022 1500 Gross per 24 hour  Intake 480 ml  Output --  Net 480 ml   Filed Weights   12/01/22 2250 12/02/22 0500 12/06/22 1808  Weight: 94.1 kg 94.1 kg 92.6 kg    Examination:  General:  Pleasantly resting in bed, No acute distress. HEENT:  Normocephalic atraumatic.  Sclerae nonicteric, noninjected.  Extraocular movements intact bilaterally. Neck:  Without mass or deformity.  Trachea is midline. Lungs:  Clear to auscultate bilaterally without rhonchi, wheeze, or rales. Heart:  Regular rate and rhythm.  Without murmurs, rubs, or gallops. Abdomen:  Soft, nontender, nondistended.  Without guarding or rebound. Extremities: Without cyanosis, clubbing, edema, or obvious deformity. Skin:  Warm and dry, no erythema.  Data Reviewed: I have personally reviewed following labs and imaging studies  CBC: Recent Labs  Lab 12/01/22 1630 12/02/22 0012 12/02/22 0417 12/03/22 0506 12/04/22 0838 12/06/22 0432  WBC 4.7  --  7.5 10.4 7.1 5.6  NEUTROABS 2.8  --   --   --   --   --   HGB 6.4* 12.2 11.4* 11.9* 11.0* 10.7*  HCT 20.5* 36.1 34.2* 37.2 33.3* 33.9*  MCV 100.5*  --  96.6 98.4 97.9 97.7  PLT 102*  --  163 143* 142* 153   Basic Metabolic Panel: Recent Labs  Lab 12/01/22 1630 12/01/22 2215 12/02/22 0012 12/02/22 0417 12/03/22 0506 12/04/22 0838  12/06/22 0432  NA 140   < > 134* 133* 135 134* 135  K 2.2*   < > 6.0* 4.8 4.6 4.3 4.4  CL 123*   < > 104 104 106 103 106  CO2 11*   < > 21* 21* 20* 20* 20*  GLUCOSE 90   < > 130* 108* 148* 91 93  BUN 26*   < > 46* 48* 41* 37* 23  CREATININE 1.13*   < > 2.31* 2.18* 2.12* 2.13* 1.72*  CALCIUM 4.1*   < > 8.7* 8.4* 8.7* 8.6* 8.8*  MG 1.1*  --   --  2.9* 2.6*  --  2.3  PHOS  --   --   --  4.1  --   --   --    < > = values in this interval not displayed.   GFR: Estimated Creatinine Clearance: 27.2 mL/min (A) (by C-G formula based on SCr of 1.72 mg/dL (H)). Liver Function Tests: Recent Labs  Lab 12/01/22 2215 12/02/22 0012 12/02/22 0417  AST 21 19 15   ALT 19 18  16  ALKPHOS 25* 23* 21*  BILITOT 0.7 1.1 0.9  PROT 6.6 6.3* 6.0*  ALBUMIN 3.6 3.3* 3.2*   Cardiac Enzymes: Recent Labs  Lab 12/01/22 1630  CKTOTAL 21*   CBG: Recent Labs  Lab 12/01/22 1608 12/06/22 1811 12/06/22 2105  GLUCAP 149* 116* 139*   Sepsis Labs: Recent Labs  Lab 12/01/22 1632  LATICACIDVEN 1.2    Recent Results (from the past 240 hour(s))  Blood culture (routine x 2)     Status: Abnormal   Collection Time: 12/01/22  4:32 PM   Specimen: BLOOD  Result Value Ref Range Status   Specimen Description   Final    BLOOD RIGHT ANTECUBITAL Performed at [facility], [address] [place], [place] [address]    Special Requests   Final    BOTTLES DRAWN AEROBIC AND ANAEROBIC Blood Culture results may not be optimal due to an excessive volume of blood received in culture bottles Performed at [facility], [address] [place], [place] [address]    Culture  Setup Time   Final    GRAM POSITIVE COCCI ANAEROBIC BOTTLE ONLY Gram Stain Report Called to,Read Back By and Verified With: N JACKSON AT 1649 ON [id number] BY S DALTON CRITICAL RESULT CALLED TO, READ BACK BY AND VERIFIED WITH: J MUSE,RN@0219  12/03/22 MK    Culture (A)  Final    STAPHYLOCOCCUS EPIDERMIDIS THE SIGNIFICANCE OF ISOLATING THIS ORGANISM  FROM A SINGLE SET OF BLOOD CULTURES WHEN MULTIPLE SETS ARE DRAWN IS UNCERTAIN. PLEASE NOTIFY THE MICROBIOLOGY DEPARTMENT WITHIN ONE WEEK IF SPECIATION AND SENSITIVITIES ARE REQUIRED. Performed at [facility], [address] [place], [place] [address]    Report Status 12/05/2022 FINAL  Final  Resp panel by RT-PCR (RSV, Flu A&B, Covid) Anterior Nasal Swab     Status: None   Collection Time: 12/01/22  4:35 PM   Specimen: Anterior Nasal Swab  Result Value Ref Range Status   SARS Coronavirus 2 by RT PCR NEGATIVE NEGATIVE Final    Comment: (NOTE) SARS-CoV-2 target nucleic acids are NOT DETECTED.  The SARS-CoV-2 RNA is generally detectable in upper respiratory specimens during the acute phase of infection. The lowest concentration of SARS-CoV-2 viral copies this assay can detect is 138 copies/mL. A negative result does not preclude SARS-Cov-2 infection and should not be used as the sole basis for treatment or other patient management decisions. A negative result may occur with  improper specimen collection/handling, submission of specimen other than nasopharyngeal swab, presence of viral mutation(s) within the areas targeted by this assay, and inadequate number of viral copies(<138 copies/mL). A negative result must be combined with clinical observations, patient history, and epidemiological information. The expected result is Negative.  Fact Sheet for Patients:  BloggerCourse.com  Fact Sheet for Healthcare Providers:  SeriousBroker.it  This test is no t yet approved or cleared by the [place] FDA and  has been authorized for detection and/or diagnosis of SARS-CoV-2 by FDA under an Emergency Use Authorization (EUA). This EUA will remain  in effect (meaning this test can be used) for the duration of the COVID-19 declaration under Section 564(b)(1) of the Act, 21 U.S.C.section 360bbb-3(b)(1), unless the authorization is  terminated  or revoked sooner.       Influenza A by PCR NEGATIVE NEGATIVE Final   Influenza B by PCR NEGATIVE NEGATIVE Final    Comment: (NOTE) The Xpert Xpress SARS-CoV-2/FLU/RSV plus assay is intended as an aid in the diagnosis of influenza from Nasopharyngeal swab specimens  and should not be used as a sole basis for treatment. Nasal washings and aspirates are unacceptable for Xpert Xpress SARS-CoV-2/FLU/RSV testing.  Fact Sheet for Patients: BloggerCourse.com  Fact Sheet for Healthcare Providers: SeriousBroker.it  This test is not yet approved or cleared by the [place] FDA and has been authorized for detection and/or diagnosis of SARS-CoV-2 by FDA under an Emergency Use Authorization (EUA). This EUA will remain in effect (meaning this test can be used) for the duration of the COVID-19 declaration under Section 564(b)(1) of the Act, 21 U.S.C. section 360bbb-3(b)(1), unless the authorization is terminated or revoked.     Resp Syncytial Virus by PCR NEGATIVE NEGATIVE Final    Comment: (NOTE) Fact Sheet for Patients: BloggerCourse.com  Fact Sheet for Healthcare Providers: SeriousBroker.it  This test is not yet approved or cleared by the [place] FDA and has been authorized for detection and/or diagnosis of SARS-CoV-2 by FDA under an Emergency Use Authorization (EUA). This EUA will remain in effect (meaning this test can be used) for the duration of the COVID-19 declaration under Section 564(b)(1) of the Act, 21 U.S.C. section 360bbb-3(b)(1), unless the authorization is terminated or revoked.  Performed at [facility], [address], [place], [place] [address]   Blood culture (routine x 2)     Status: Abnormal   Collection Time: 12/01/22  5:28 PM   Specimen: BLOOD  Result Value Ref Range Status   Specimen Description   Final    BLOOD BLOOD LEFT  HAND Performed at [facility], [address], [place], [place] [address]    Special Requests   Final    BOTTLES DRAWN AEROBIC ONLY Blood Culture results may not be optimal due to an excessive volume of blood received in culture bottles Performed at [facility], [address], [place], [place] [address]    Culture  Setup Time   Final    GRAM POSITIVE COCCI AEROBIC BOTTLE ONLY Gram Stain Report Called to,Read Back By and Verified With: E CHEEK AT 1421 ON [id number] BY S DALTON Organism ID to follow    Culture (A)  Final    STAPHYLOCOCCUS CAPITIS THE SIGNIFICANCE OF ISOLATING THIS ORGANISM FROM A SINGLE SET OF BLOOD CULTURES WHEN MULTIPLE SETS ARE DRAWN IS UNCERTAIN. PLEASE NOTIFY THE MICROBIOLOGY DEPARTMENT WITHIN ONE WEEK IF SPECIATION AND SENSITIVITIES ARE REQUIRED. Performed at [facility], [address] [place], [place] [address]    Report Status 12/04/2022 FINAL  Final  Blood Culture ID Panel (Reflexed)     Status: Abnormal   Collection Time: 12/01/22  5:28 PM  Result Value Ref Range Status   Enterococcus faecalis NOT DETECTED NOT DETECTED Final   Enterococcus Faecium NOT DETECTED NOT DETECTED Final   Listeria monocytogenes NOT DETECTED NOT DETECTED Final   Staphylococcus species DETECTED (A) NOT DETECTED Final    Comment: CRITICAL RESULT CALLED TO, READ BACK BY AND VERIFIED WITH: J MUSE,RN@0220  12/03/22 MK    Staphylococcus aureus (BCID) NOT DETECTED NOT DETECTED Final   Staphylococcus epidermidis NOT DETECTED NOT DETECTED Final   Staphylococcus lugdunensis NOT DETECTED NOT DETECTED Final   Streptococcus species NOT DETECTED NOT DETECTED Final   Streptococcus agalactiae NOT DETECTED NOT DETECTED Final   Streptococcus pneumoniae NOT DETECTED NOT DETECTED Final   Streptococcus pyogenes NOT DETECTED NOT DETECTED Final   A.calcoaceticus-baumannii NOT DETECTED NOT DETECTED Final   Bacteroides fragilis NOT DETECTED NOT DETECTED Final   Enterobacterales NOT DETECTED NOT  DETECTED Final   Enterobacter cloacae complex NOT  DETECTED NOT DETECTED Final   Escherichia coli NOT DETECTED NOT DETECTED Final   Klebsiella aerogenes NOT DETECTED NOT DETECTED Final   Klebsiella oxytoca NOT DETECTED NOT DETECTED Final   Klebsiella pneumoniae NOT DETECTED NOT DETECTED Final   Proteus species NOT DETECTED NOT DETECTED Final   Salmonella species NOT DETECTED NOT DETECTED Final   Serratia marcescens NOT DETECTED NOT DETECTED Final   Haemophilus influenzae NOT DETECTED NOT DETECTED Final   Neisseria meningitidis NOT DETECTED NOT DETECTED Final   Pseudomonas aeruginosa NOT DETECTED NOT DETECTED Final   Stenotrophomonas maltophilia NOT DETECTED NOT DETECTED Final   Candida albicans NOT DETECTED NOT DETECTED Final   Candida auris NOT DETECTED NOT DETECTED Final   Candida glabrata NOT DETECTED NOT DETECTED Final   Candida krusei NOT DETECTED NOT DETECTED Final   Candida parapsilosis NOT DETECTED NOT DETECTED Final   Candida tropicalis NOT DETECTED NOT DETECTED Final   Cryptococcus neoformans/gattii NOT DETECTED NOT DETECTED Final    Comment: Performed at [facility], [address] [place], [place] [address]  MRSA Next Gen by PCR, Nasal     Status: None   Collection Time: 12/01/22 10:39 PM   Specimen: Nasal Mucosa; Nasal Swab  Result Value Ref Range Status   MRSA by PCR Next Gen NOT DETECTED NOT DETECTED Final    Comment: (NOTE) The GeneXpert MRSA Assay (FDA approved for NASAL specimens only), is one component of a comprehensive MRSA colonization surveillance program. It is not intended to diagnose MRSA infection nor to guide or monitor treatment for MRSA infections. Test performance is not FDA approved in patients less than 28 years old. Performed at [facility], [address] [place], [place] [address]    Radiology Studies: No results found.  Scheduled Meds:  cyanocobalamin  1,000 mcg Intramuscular Daily   digoxin  0.0625 mg Oral Once per day on Monday  Wednesday Friday   folic acid  1 mg Oral Daily   metoprolol succinate  50 mg Oral QHS   nystatin   Topical TID   pantoprazole (PROTONIX) IV  40 mg Intravenous Q12H   sacubitril-valsartan  1 tablet Oral BID   simvastatin  20 mg Oral QPM   Continuous Infusions:   LOS: 5 days   Time spent:  Azucena Fallen, DO [facility]  If 7PM-7AM, please contact night-coverage [email or web address]  12/07/2022, 7:46 AM

## 2022-12-07 NOTE — Progress Notes (Signed)
Mobility Specialist Progress Note:    12/07/22 1214  Mobility  Activity Ambulated with assistance in hallway  Level of Assistance Contact guard assist, steadying assist  Assistive Device Other (Comment) (Hand railing)  Distance Ambulated (ft) 200 ft  Activity Response Tolerated well  Mobility Referral Yes  $Mobility charge 1 Mobility  Mobility Specialist Start Time (ACUTE ONLY) 1117  Mobility Specialist Stop Time (ACUTE ONLY) 1128  Mobility Specialist Time Calculation (min) (ACUTE ONLY) 11 min   Received pt in chair having no complaints and agreeable to mobility. Pt was asymptomatic throughout ambulation and returned to room w/o fault. Left in chair w/ call bell in reach and all needs met.   Thompson Grayer Mobility Specialist  Please contact vis Secure Chat or  Rehab Office [phone]

## 2022-12-07 NOTE — Progress Notes (Signed)
Patient ID: Terry Hanson, female   DOB: Aug 12, 1940, 82 y.o.   MRN: [id number]   Brief GI Note;  Patient was seen in consultation by GI at [facility] prior to transfer here, please see that consultation note. 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  Transfused and hemoglobin has been stable since. HGB 10.8 today  Hemoccult negative on 12/01/2022  We are aware, and can plan for GI evaluation here after she completes cardiac workup Per Cardiology notes will have EP eval  on Monday, and may need cath.

## 2022-12-07 NOTE — Progress Notes (Signed)
Brief cardiology note: Patient transferred yesterday from [facility]. She was admitted with syncope, ICD noted PMVT to VF, received shock and chest compressions. 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 She was transferred for EP evaluation. GI evaluation on hold until she is evaluated by EP.  At this time, general cardiology will follow peripherally, but we are available for questions if needed.  Jodelle Red, MD, PhD, [facility]  [facility]  [place]  [facility] at [facility] at [facility] [address], [address] [place], [place] [address] [phone]

## 2022-12-07 NOTE — Consult Note (Addendum)
Cardiology Consultation   Patient ID: Terry Hanson MRN: [id number]; DOB: 11-07-1940  Admit date: 12/01/2022 Date of Consult: 12/07/2022  PCP:  Aviva Kluver   [facility] Providers Cardiologist:  Marjo Bicker, MD  Electrophysiologist:  Lewayne Bunting, MD  {    Patient Profile:   Terry Hanson is a 82 y.o. female with a hx of CAD (hx of STEMI, PCI to LAD 2021), chronic CHF (systolic), DM, CKD (III), AFib (ablated 2021 Dr. Elberta Fortis), ICD who is being seen 12/07/2022 for the evaluation of VT/VF at the request of Dr. Brett Canales.  Device information Abbott single chamber ICD implanted 12/14/19 AAD hx Amiodarone intolerance w/N/V  History of Present Illness:   Terry Hanson saw Dr. Ladona Ridgel July this year discussed inappropriate shock for Afib, BB increased, not felt a good candidate for re-do ablation with advanced age and obesity, might consider Tikosyn if more  Terry was admitted to [facility] 12/01/22 with syncope, daughter heard her fall went to check on her noted unresponsive.activated EMS and started CPR, fairly quickly seemed to start waking up By chart review EMS found her hypotensive to 70/40 In ED Terry was 68/48.  Terry was found to have profound lab abnormalities with Hgb down to 6.4, hypokalemia of 2.2, hypocalcemia of 4.1 Mg 1.1 with Cr of 1.13.  Terry received urgent blood transfusion, crystalloid boluses, electrolyte replacement.  Subsequent labs overnight then showed hyperkalemia of 6.2 and AKI with Cr 2.43 with repeat Hgb 11.4. Terry has had some SOB but no chest pain. hsTroponin 32-85-80.    Home eliquis held Reports of BRBPR noted  Cardiology consulted GI consulted and planned for anemia w/u  ICD interrogation noted appropriate device therapy for PMVT/VF, discussed need for LHC though GI w/u forst given profound anemia (prior to any potential need for DAPT) Planned for transfer to [facility]  Reported as noted to have frequent PVCs no VT while there Unfortunately transfer delayed 2/2  no available beds   LABS (today) K+ 4.5 BUN.Creat 22/2.09 Mag yesterday was 2.3 WBC 5.8 H/H 10/32 Plts 159  Device transmission (dated 12/03/22) Battery and lead measurements are good VP <1% 12/01/22 episode  VF zone episode Starts irregular,  > initially called an SVT by the device > accelerates without change in morphology > into VT zone > does have a morphology change getting faster > ATP > VF /flutter > HV tx  Leading up to her day of admission, Terry denies any CP, no unusual SOB, has baseline DOE that has been unchanged for years When Terry saw Dr. Ladona Ridgel in July, Terry was aware of the shock, but no pre syncope or syncope, was getting ready for church  Terry had no warning this event, woke up to her daughter doing CPR Remains sore in the chest, otherwise feeling OK Just very very hungry, waiting to hear when Terry can eat  Terry had not noted any melena or blood but her daughter told her there was blood in the toilet    Past Medical History:  Diagnosis Date   Arthritis    Asthma    Chronic HFrEF (heart failure with reduced ejection fraction) (HCC)    Chronic kidney disease, stage 3b (HCC)    Diabetes mellitus (HCC)    Hypercholesterolemia    Hyperlipidemia    Ischemic cardiomyopathy    STEMI (ST elevation myocardial infarction) (HCC)    a. 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    Past Surgical History:  Procedure Laterality Date  ABDOMINAL HYSTERECTOMY     ATRIAL FIBRILLATION ABLATION N/A 04/05/2021   Procedure: ATRIAL FIBRILLATION ABLATION;  Surgeon: Regan Lemming, MD;  Location: [facility] INVASIVE CV LAB;  Service: Cardiovascular;  Laterality: N/A;   CATARACT EXTRACTION W/PHACO Right 06/27/2020   Procedure: CATARACT EXTRACTION PHACO AND INTRAOCULAR LENS PLACEMENT RIGHT EYE AND PLACEMENT OF CORTICOSTEROID;  Surgeon: Fabio Pierce, MD;  Location: [facility] ORS;  Service: Ophthalmology;  Laterality: Right;  right CDE=25.47   CORONARY/GRAFT ACUTE MI REVASCULARIZATION N/A  06/29/2019   Procedure: Coronary/Graft Acute MI Revascularization;  Surgeon: Lyn Records, MD;  Location: [facility] INVASIVE CV LAB;  Service: Cardiovascular;  Laterality: N/A;   ICD IMPLANT N/A 12/14/2019   Procedure: ICD IMPLANT;  Surgeon: Marinus Maw, MD;  Location: [facility] INVASIVE CV LAB;  Service: Cardiovascular;  Laterality: N/A;   LEFT HEART CATH AND CORONARY ANGIOGRAPHY N/A 06/29/2019   Procedure: LEFT HEART CATH AND CORONARY ANGIOGRAPHY;  Surgeon: Lyn Records, MD;  Location: [facility] INVASIVE CV LAB;  Service: Cardiovascular;  Laterality: N/A;   RIGHT HEART CATH N/A 12/30/2020   Procedure: RIGHT HEART CATH;  Surgeon: Laurey Morale, MD;  Location: [facility] INVASIVE CV LAB;  Service: Cardiovascular;  Laterality: N/A;   TEE WITHOUT CARDIOVERSION N/A 04/04/2021   Procedure: TRANSESOPHAGEAL ECHOCARDIOGRAM (TEE);  Surgeon: Jake Bathe, MD;  Location: [facility] ENDOSCOPY;  Service: Cardiovascular;  Laterality: N/A;     Home Medications:  Prior to Admission medications   Medication Sig Start Date End Date Taking? Authorizing Provider  apixaban (ELIQUIS) 2.5 MG TABS tablet Take 1 tablet (2.5 mg total) by mouth 2 (two) times daily. 06/29/22  Yes Mallipeddi, Vishnu P, MD  digoxin (LANOXIN) 0.125 MG tablet Take 0.5 tablets (0.0625 mg total) by mouth 3 (three) times a week. Taking Monday, Wednesday, and Friday. 04/18/22  Yes Lenze, Michele M, PA-C  FARXIGA 10 MG TABS tablet TAKE ONE TABLET (10 MG TOTAL) BY MOUTH DAILY AT 9 AM BEFORE BREAKFAST Patient taking differently: Take 10 mg by mouth daily. 08/30/22  Yes Mallipeddi, Vishnu P, MD  furosemide (LASIX) 20 MG tablet Take 1 tablet (20 mg total) by mouth daily. 05/01/22  Yes Laurey Morale, MD  metoprolol succinate (TOPROL-XL) 50 MG 24 hr tablet TAKE 1 TABLET(50 MG) BY MOUTH AT BEDTIME Patient taking differently: Take 50 mg by mouth at bedtime. 05/01/22  Yes Laurey Morale, MD  nitroGLYCERIN (NITROSTAT) 0.4 MG SL tablet Place 1 tablet (0.4 mg total) under the tongue  every 5 (five) minutes as needed. 07/02/19  Yes Arty Baumgartner, NP  potassium chloride (KLOR-CON) 10 MEQ tablet Take 1 tablet (10 mEq total) by mouth 2 (two) times daily. 07/27/22  Yes Mallipeddi, Vishnu P, MD  sacubitril-valsartan (ENTRESTO) 24-26 MG Take 1 tablet by mouth 2 (two) times daily. 05/30/22  Yes Mallipeddi, Vishnu P, MD  simvastatin (ZOCOR) 20 MG tablet TAKE 1 TABLET(20 MG) BY MOUTH EVERY EVENING Patient taking differently: Take 20 mg by mouth every evening. 05/01/22  Yes Laurey Morale, MD  spironolactone (ALDACTONE) 25 MG tablet Take 1 tablet (25 mg total) by mouth at bedtime. 05/01/22  Yes Laurey Morale, MD    Inpatient Medications: Scheduled Meds:  cyanocobalamin  1,000 mcg Intramuscular Daily   digoxin  0.0625 mg Oral Once per day on Monday Wednesday Friday   folic acid  1 mg Oral Daily   metoprolol succinate  50 mg Oral QHS   nystatin   Topical TID   pantoprazole (PROTONIX) IV  40 mg Intravenous  Q12H   sacubitril-valsartan  1 tablet Oral BID   simvastatin  20 mg Oral QPM   Continuous Infusions:  PRN Meds: acetaminophen **OR** acetaminophen, melatonin, ondansetron **OR** ondansetron (ZOFRAN) IV, mouth rinse  Allergies:    Allergies  Allergen Reactions   Iodinated Contrast Media Shortness Of Breath    Per Patient- The last time Terry had contrast during a scan Terry had difficulty breathing   Codeine Hives   Shrimp [Shellfish Allergy] Other (See Comments)    Broke out   Amiodarone Nausea And Vomiting   Penicillins Itching and Rash    Did it involve swelling of the face/tongue/throat, SOB, or low BP? Yes Did it involve sudden or severe rash/hives, skin peeling, or any reaction on the inside of your mouth or nose? Yes Did you need to seek medical attention at a hospital or doctor's office? Yes When did it last happen? Over 10 years       If all above answers are "NO", may proceed with cephalosporin use.    Sulfa Antibiotics Itching    Social History:    Social History   Socioeconomic History   Marital status: Married    Spouse name: Not on file   Number of children: Not on file   Years of education: Not on file   Highest education level: Not on file  Occupational History   Not on file  Tobacco Use   Smoking status: Never   Smokeless tobacco: Former    Types: Chew   Tobacco comments:    Former chew tobacco 05/03/21  Vaping Use   Vaping status: Never Used  Substance and Sexual Activity   Alcohol use: Not Currently   Drug use: Never   Sexual activity: Not on file  Other Topics Concern   Not on file  Social History Narrative   ** Merged History Encounter **       Social Determinants of Health   Financial Resource Strain: Not on file  Food Insecurity: No Food Insecurity (12/01/2022)   Hunger Vital Sign    Worried About Running Out of Food in the Last Year: Never true    Ran Out of Food in the Last Year: Never true  Transportation Needs: No Transportation Needs (12/01/2022)   PRAPARE - Administrator, Civil Service (Medical): No    Lack of Transportation (Non-Medical): No  Physical Activity: Not on file  Stress: Not on file  Social Connections: Not on file  Intimate Partner Violence: Not At Risk (12/01/2022)   Humiliation, Afraid, Rape, and Kick questionnaire    Fear of Current or Ex-Partner: No    Emotionally Abused: No    Physically Abused: No    Sexually Abused: No    Family History:   Family History  Problem Relation Age of Onset   Heart disease Mother      ROS:  Please see the history of present illness.  All other ROS reviewed and negative.     Physical Exam/Data:   Vitals:   12/07/22 0018 12/07/22 0433 12/07/22 0819 12/07/22 0937  BP: (!) 124/52 (!) 119/51 (!) 140/73   Pulse: 64 61 65 (!) 49  Resp: 16 19 18    Temp: 98.6 F (37 C) 98.6 F (37 C) 98.6 F (37 C)   TempSrc: Oral Oral Oral   SpO2: 97% 99% 93%   Weight:      Height:        Intake/Output Summary (Last 24 hours) at  12/07/2022 1229 Last  data filed at 12/06/2022 1500 Gross per 24 hour  Intake 240 ml  Output --  Net 240 ml      12/06/2022    6:08 PM 12/02/2022    5:00 AM 12/01/2022   10:50 PM  Last 3 Weights  Weight (lbs) 204 lb 1.6 oz 207 lb 7.3 oz 207 lb 7.3 oz  Weight (kg) 92.579 kg 94.1 kg 94.1 kg     Body mass index is 37.33 kg/m.  General:  Well nourished, well developed, in no acute distress HEENT: normal Neck: no JVD Vascular: No carotid bruits; Distal pulses 2+ bilaterally Cardiac:  RRR; no murmurs, gallops or rubs Lungs:  CTA b/l no wheezing, rhonchi or rales  Abd: soft, nontender, no hepatomegaly  Ext: no edema Musculoskeletal:  No deformities Skin: warm and dry  Neuro:  no focal abnormalities noted Psych:  Normal affect   EKG:  The EKG was personally reviewed and demonstrates:    SR 76bpm, low voltage, some T flattening, no ST change  Telemetry:  Telemetry was personally reviewed and demonstrates:    SB 40's-50's mostly, occasional PVCs   Relevant CV Studies:  12/02/22: TTE 1. Left ventricular ejection fraction, by estimation, is 20 to 25%. The  left ventricle has severely decreased function. The left ventricle  demonstrates regional wall motion abnormalities (see scoring  diagram/findings for description). Left ventricular  diastolic parameters are consistent with Grade III diastolic dysfunction  (restrictive). Elevated left atrial pressure.   2. Right ventricular systolic function is normal. The right ventricular  size is normal.   3. The mitral valve is normal in structure. No evidence of mitral valve  regurgitation. No evidence of mitral stenosis.   4. The aortic valve is normal in structure. Aortic valve regurgitation is  not visualized. Mild to moderate aortic valve stenosis. Aortic valve area,  by VTI measures 1.13 cm. Aortic valve mean gradient measures 8.0 mmHg.  Aortic valve Vmax measures 1.76  m/s.   5. The inferior vena cava is normal in size with  greater than 50%  respiratory variability, suggesting right atrial pressure of 3 mmHg.    04/05/2021 EPS/ablation CONCLUSIONS: 1. Atrial fibrillation upon presentation.   2. Successful electrical isolation and anatomical encircling of all four pulmonary veins with radiofrequency current.  A WACA approach was used 3. Additional left atrial ablation was performed with a standard box lesion created along the posterior wall of the left atrium 4. Atrial fibrillation successfully cardioverted to sinus rhythm. 5. No early apparent complications.   Laboratory Data:  High Sensitivity Troponin:   Recent Labs  Lab 12/01/22 1630 12/01/22 1738 12/01/22 2215 12/02/22 0012  TROPONINIHS 7 32* 85* 80*     Chemistry Recent Labs  Lab 12/02/22 0417 12/03/22 0506 12/04/22 0838 12/06/22 0432 12/07/22 0755  NA 133* 135 134* 135 137  K 4.8 4.6 4.3 4.4 4.5  CL 104 106 103 106 112*  CO2 21* 20* 20* 20* 20*  GLUCOSE 108* 148* 91 93 123*  BUN 48* 41* 37* 23 22  CREATININE 2.18* 2.12* 2.13* 1.72* 2.09*  CALCIUM 8.4* 8.7* 8.6* 8.8* 8.5*  MG 2.9* 2.6*  --  2.3  --   GFRNONAA 22* 23* 23* 30* 23*  ANIONGAP 8 9 11 9 5     Recent Labs  Lab 12/01/22 2215 12/02/22 0012 12/02/22 0417  PROT 6.6 6.3* 6.0*  ALBUMIN 3.6 3.3* 3.2*  AST 21 19 15   ALT 19 18 16   ALKPHOS 25* 23* 21*  BILITOT 0.7 1.1  0.9   Lipids No results for input(s): "CHOL", "TRIG", "HDL", "LABVLDL", "LDLCALC", "CHOLHDL" in the last 168 hours.  Hematology Recent Labs  Lab 12/04/22 0838 12/06/22 0432 12/07/22 0755  WBC 7.1 5.6 5.8  RBC 3.40* 3.47* 3.33*  HGB 11.0* 10.7* 10.8*  HCT 33.3* 33.9* 32.9*  MCV 97.9 97.7 98.8  MCH 32.4 30.8 32.4  MCHC 33.0 31.6 32.8  RDW 14.5 14.0 14.2  PLT 142* 153 159   Thyroid  Recent Labs  Lab 12/01/22 1632  TSH 2.478    BNPNo results for input(s): "BNP", "PROBNP" in the last 168 hours.  DDimer No results for input(s): "DDIMER" in the last 168 hours.   Radiology/Studies:  No results  found.   Assessment and Plan:   VT/VF I suspect initial rhythm may have been rapid AFib though very fast, was irregular > accelerated into VT/VF > ATP > AVF > Shocked > return of rhythm. 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 Persistent AFib CHA2DS2Vasc is 5, home Eliquis held given LGIB/anemia Maintaining SR here    Risk Assessment/Risk Scores:   For questions or updates, please contact [facility] Please consult [email or web address] for contact info under    Signed, Terry Pigeon, PA-C  12/07/2022 12:29 PM  I have seen and examined this patient with Francis Dowse.  Agree with above, note added to reflect my findings.  Patient presented to an outside hospital after an episode of syncope.  Her daughter heard her fall and found her unresponsive.  Terry did CPR fairly quickly and the patient began to arouse.  In the emergency room, Terry was found to be hypotensive with severe anemia, potassium 2.2, magnesium 1.1.  Terry was given supplementation with significant improvement in her anemia, potassium, magnesium.  Terry has plans for colonoscopy.  GEN: Well nourished, well developed, in no acute distress  HEENT: normal  Neck: no JVD, carotid bruits, or masses Cardiac: RRR; no murmurs, rubs, or gallops,no edema  Respiratory:  clear to auscultation bilaterally, normal work of breathing GI: soft, nontender, nondistended, + BS MS: no deformity or atrophy  Skin: warm and dry, device site well healed Neuro:  Strength and sensation are intact Psych: euthymic mood, full affect   VT VF: Patient had a quite rapid rhythm, received ATP which  degenerated the rhythm into VF and Terry was shocked by her defibrillator.  The initial rhythm was quite rapid, but somewhat irregular, bring in the question of rapid atrial fibrillation.  Despite this, with her low potassium magnesium, it is certainly possible that these were the instigators of her arrhythmia.  Now potassium magnesium are normal, would continue to monitor.  For now we Shayann Garbutt hold off on further antiarrhythmics. Persistent atrial fibrillation: Eliquis held for GI bleed  Terry Ausburn M. Mikita Lesmeister MD 12/07/2022 5:16 PM

## 2022-12-07 NOTE — Plan of Care (Signed)

## 2022-12-08 DIAGNOSIS — R55 Syncope and collapse: Secondary | ICD-10-CM | POA: Diagnosis not present

## 2022-12-08 LAB — GLUCOSE, CAPILLARY
Glucose-Capillary: 108 mg/dL — ABNORMAL HIGH (ref 70–99)
Glucose-Capillary: 96 mg/dL (ref 70–99)

## 2022-12-08 LAB — HEMOGLOBIN A1C
Hgb A1c MFr Bld: 6.2 % — ABNORMAL HIGH (ref 4.8–5.6)
Mean Plasma Glucose: 131.24 mg/dL

## 2022-12-08 LAB — CBC
HCT: 34.6 % — ABNORMAL LOW (ref 36.0–46.0)
Hemoglobin: 11.4 g/dL — ABNORMAL LOW (ref 12.0–15.0)
MCH: 32.8 pg (ref 26.0–34.0)
MCHC: 32.9 g/dL (ref 30.0–36.0)
MCV: 99.4 fL (ref 80.0–100.0)
Platelets: 181 10*3/uL (ref 150–400)
RBC: 3.48 MIL/uL — ABNORMAL LOW (ref 3.87–5.11)
RDW: 14.4 % (ref 11.5–15.5)
WBC: 6.4 10*3/uL (ref 4.0–10.5)
nRBC: 0 % (ref 0.0–0.2)

## 2022-12-08 LAB — BASIC METABOLIC PANEL
Anion gap: 7 (ref 5–15)
BUN: 28 mg/dL — ABNORMAL HIGH (ref 8–23)
CO2: 21 mmol/L — ABNORMAL LOW (ref 22–32)
Calcium: 9 mg/dL (ref 8.9–10.3)
Chloride: 108 mmol/L (ref 98–111)
Creatinine, Ser: 2.02 mg/dL — ABNORMAL HIGH (ref 0.44–1.00)
GFR, Estimated: 24 mL/min — ABNORMAL LOW (ref >60)
Glucose, Bld: 116 mg/dL — ABNORMAL HIGH (ref 70–99)
Potassium: 4.8 mmol/L (ref 3.5–5.1)
Sodium: 136 mmol/L (ref 135–145)

## 2022-12-08 LAB — DIGOXIN LEVEL: Digoxin Level: <0.2 ng/mL — ABNORMAL LOW (ref 0.8–2.0)

## 2022-12-08 NOTE — Progress Notes (Signed)
PROGRESS NOTE    Terry Hanson  UXL:[id number] DOB: 09-20-1940 DOA: 12/01/2022 PCP: Pcp, No   Brief Narrative:  Terry Hanson is a 83 y.o. female with medical history significant of chronic systolic CHF, atrial fibrillation, CAD s/p stent placement (DES to proximal LAD 06/2019), CKD 3 who presents to the emergency department from home via EMS due to syncope -CPR performed in the field, ROSC after 5 rounds per report.  Admitted to hospitalist service for further evaluation treatment of questionable cardiac event, cardiology called to consult.  At some point during intake patient had notable bright red blood per rectum concerning for GI bleed -as such GI was called in consult to help rule out any ongoing active bleed.  Patient's home Eliquis has been held.  Assessment & Plan:   Principal Problem:   Cardiac syncope Active Problems:   Chronic systolic CHF (congestive heart failure) (HCC)   CAD (coronary artery disease)   Stage 3 chronic kidney disease (HCC)   Polymorphic ventricular tachycardia (HCC)   Fall at home, initial encounter   Hypomagnesemia   Hypokalemia   Hypocalcemia   Symptomatic anemia   Thrombocytopenia (HCC)   Macrocytic anemia   Persistent atrial fibrillation (HCC)   Obesity (BMI 30-39.9)   Anemia   Ventricular fibrillation (HCC)  Syncopal event, consistent with PMVT> V-fib requiring ICD shock -Cardiology following, appreciate insight and recommendations -EP not recommending any further procedure or evaluation, okay to proceed with endoscopy per GI. -No further issues or events, continue telemetry, ambulate as appropriate, replete electrolytes as necessary   Systolic congestive heart failure -Chronic, compensated, appears euvolemic  -follow I's and O's  -Continue home metoprolol, hold home Aldactone, Lasix, Entresto. Digoxin held this morning due to hypotension -defer to cardiology for further adjustment in medication -Echo pending  Questionable GI bleed, acute,  transient - Bright red blood noted per rectum at prior facility  - Status post 1 unit PRBC 9/14 - Hemoglobin remains quite stable, no further events of bright red blood or black stool or other signs or symptoms of bleeding - GI consulted, appreciate insight recommendations, unclear if patient requires further endoscopy or close monitoring given stable hemoglobin  Hyperlipidemia -Continue statin -Heart healthy diet discussed with patient.   Morbid obesity Body mass index is 37.33 kg/m. -Control and increase physical activity discussed with patient.   Hypertension -currently hypotensive -Improving after cessation of Entresto Aldactone and Lasix, digoxin also held this morning in the setting of hypotension and bradycardia  AKI on CKD 3B -Baseline creatinine around 1.7 -Creatinine downtrending appropriately currently 2.1   Elevated troponin -Likely demand ischemia in the setting of above, no further EKG changes -Cardiology following; labs and echo as above  Paroxysmal atrial fibrillation history -Continue metoprolol  -digoxin held this morning due to bradycardia -Anticoagulation on hold in the setting of questionable GI bleed   Macrocytosis/B12 deficiency -IM B12 daily for 7 days, then weekly x 1 month and then monthly after that. -Continue supportive care and follow levels in the outpatient setting. -Repeat B12 level as an outpatient in 12 weeks.  DVT prophylaxis: Place and maintain sequential compression device Start: 12/04/22 1248 SCDs Start: 12/01/22 2215   Code Status:   Code Status: Full Code  Family Communication: At bedside  Status is: Inpatient  Dispo: The patient is from: Home              Anticipated d/c is to: Home  Anticipated d/c date is: 48 to 72 hours pending procedures as above              Patient currently not medically stable for discharge  Consultants:  GI, cardiology/EP  Procedures:  Pending as above  Antimicrobials:   None  Subjective: No acute issues or events overnight denies any further black stool melena or bright red blood per rectum.  Denies nausea vomiting headache fever chills chest pain shortness of breath.  Objective: Vitals:   12/07/22 1937 12/07/22 2205 12/08/22 0303 12/08/22 0306  BP: (!) 107/51 114/82    Pulse: (!) 50 (!) 52    Resp: 18 18 18 20   Temp: 98 F (36.7 C)  98.1 F (36.7 C) 98.1 F (36.7 C)  TempSrc: Oral  Oral Oral  SpO2: 100% 100%    Weight:      Height:        Intake/Output Summary (Last 24 hours) at 12/08/2022 0824 Last data filed at 12/07/2022 1600 Gross per 24 hour  Intake 480 ml  Output --  Net 480 ml   Filed Weights   12/01/22 2250 12/02/22 0500 12/06/22 1808  Weight: 94.1 kg 94.1 kg 92.6 kg    Examination:  General:  Pleasantly resting in bed, No acute distress. HEENT:  Normocephalic atraumatic.  Sclerae nonicteric, noninjected.  Extraocular movements intact bilaterally. Neck:  Without mass or deformity.  Trachea is midline. Lungs:  Clear to auscultate bilaterally without rhonchi, wheeze, or rales. Heart:  Regular rate and rhythm.  Without murmurs, rubs, or gallops. Abdomen:  Soft, nontender, nondistended.  Without guarding or rebound. Extremities: Without cyanosis, clubbing, edema, or obvious deformity. Skin:  Warm and dry, no erythema.  Data Reviewed: I have personally reviewed following labs and imaging studies  CBC: Recent Labs  Lab 12/01/22 1630 12/02/22 0012 12/03/22 0506 12/04/22 0838 12/06/22 0432 12/07/22 0755 12/08/22 0641  WBC 4.7   < > 10.4 7.1 5.6 5.8 6.4  NEUTROABS 2.8  --   --   --   --   --   --   HGB 6.4*   < > 11.9* 11.0* 10.7* 10.8* 11.4*  HCT 20.5*   < > 37.2 33.3* 33.9* 32.9* 34.6*  MCV 100.5*   < > 98.4 97.9 97.7 98.8 99.4  PLT 102*   < > 143* 142* 153 159 181   < > = values in this interval not displayed.   Basic Metabolic Panel: Recent Labs  Lab 12/01/22 1630 12/01/22 2215 12/02/22 0417 12/03/22 0506  12/04/22 0838 12/06/22 0432 12/07/22 0755 12/08/22 0641  NA 140   < > 133* 135 134* 135 137 136  K 2.2*   < > 4.8 4.6 4.3 4.4 4.5 4.8  CL 123*   < > 104 106 103 106 112* 108  CO2 11*   < > 21* 20* 20* 20* 20* 21*  GLUCOSE 90   < > 108* 148* 91 93 123* 116*  BUN 26*   < > 48* 41* 37* 23 22 28*  CREATININE 1.13*   < > 2.18* 2.12* 2.13* 1.72* 2.09* 2.02*  CALCIUM 4.1*   < > 8.4* 8.7* 8.6* 8.8* 8.5* 9.0  MG 1.1*  --  2.9* 2.6*  --  2.3  --   --   PHOS  --   --  4.1  --   --   --   --   --    < > = values in this interval not displayed.   GFR:  Estimated Creatinine Clearance: 23.1 mL/min (A) (by C-G formula based on SCr of 2.02 mg/dL (H)). Liver Function Tests: Recent Labs  Lab 12/01/22 2215 12/02/22 0012 12/02/22 0417  AST 21 19 15   ALT 19 18 16   ALKPHOS 25* 23* 21*  BILITOT 0.7 1.1 0.9  PROT 6.6 6.3* 6.0*  ALBUMIN 3.6 3.3* 3.2*   Cardiac Enzymes: Recent Labs  Lab 12/01/22 1630  CKTOTAL 21*   CBG: Recent Labs  Lab 12/01/22 1608 12/06/22 1811 12/06/22 2105 12/08/22 0721  GLUCAP 149* 116* 139* 108*   Sepsis Labs: Recent Labs  Lab 12/01/22 1632  LATICACIDVEN 1.2    Recent Results (from the past 240 hour(s))  Blood culture (routine x 2)     Status: Abnormal   Collection Time: 12/01/22  4:32 PM   Specimen: BLOOD  Result Value Ref Range Status   Specimen Description   Final    BLOOD RIGHT ANTECUBITAL Performed at [facility], [address] [place], [place] [address]    Special Requests   Final    BOTTLES DRAWN AEROBIC AND ANAEROBIC Blood Culture results may not be optimal due to an excessive volume of blood received in culture bottles Performed at [facility], [address] [place], [place] [address]    Culture  Setup Time   Final    GRAM POSITIVE COCCI ANAEROBIC BOTTLE ONLY Gram Stain Report Called to,Read Back By and Verified With: N JACKSON AT 1649 ON [id number] BY S DALTON CRITICAL RESULT CALLED TO, READ BACK BY AND VERIFIED WITH: J MUSE,RN@0219   12/03/22 MK    Culture (A)  Final    STAPHYLOCOCCUS EPIDERMIDIS THE SIGNIFICANCE OF ISOLATING THIS ORGANISM FROM A SINGLE SET OF BLOOD CULTURES WHEN MULTIPLE SETS ARE DRAWN IS UNCERTAIN. PLEASE NOTIFY THE MICROBIOLOGY DEPARTMENT WITHIN ONE WEEK IF SPECIATION AND SENSITIVITIES ARE REQUIRED. Performed at [facility], [address] [place], [place] [address]    Report Status 12/05/2022 FINAL  Final  Resp panel by RT-PCR (RSV, Flu A&B, Covid) Anterior Nasal Swab     Status: None   Collection Time: 12/01/22  4:35 PM   Specimen: Anterior Nasal Swab  Result Value Ref Range Status   SARS Coronavirus 2 by RT PCR NEGATIVE NEGATIVE Final    Comment: (NOTE) SARS-CoV-2 target nucleic acids are NOT DETECTED.  The SARS-CoV-2 RNA is generally detectable in upper respiratory specimens during the acute phase of infection. The lowest concentration of SARS-CoV-2 viral copies this assay can detect is 138 copies/mL. A negative result does not preclude SARS-Cov-2 infection and should not be used as the sole basis for treatment or other patient management decisions. A negative result may occur with  improper specimen collection/handling, submission of specimen other than nasopharyngeal swab, presence of viral mutation(s) within the areas targeted by this assay, and inadequate number of viral copies(<138 copies/mL). A negative result must be combined with clinical observations, patient history, and epidemiological information. The expected result is Negative.  Fact Sheet for Patients:  BloggerCourse.com  Fact Sheet for Healthcare Providers:  SeriousBroker.it  This test is no t yet approved or cleared by the [place] FDA and  has been authorized for detection and/or diagnosis of SARS-CoV-2 by FDA under an Emergency Use Authorization (EUA). This EUA will remain  in effect (meaning this test can be used) for the duration of the COVID-19  declaration under Section 564(b)(1) of the Act, 21 U.S.C.section 360bbb-3(b)(1), unless the authorization is terminated  or revoked sooner.  Influenza A by PCR NEGATIVE NEGATIVE Final   Influenza B by PCR NEGATIVE NEGATIVE Final    Comment: (NOTE) The Xpert Xpress SARS-CoV-2/FLU/RSV plus assay is intended as an aid in the diagnosis of influenza from Nasopharyngeal swab specimens and should not be used as a sole basis for treatment. Nasal washings and aspirates are unacceptable for Xpert Xpress SARS-CoV-2/FLU/RSV testing.  Fact Sheet for Patients: BloggerCourse.com  Fact Sheet for Healthcare Providers: SeriousBroker.it  This test is not yet approved or cleared by the [place] FDA and has been authorized for detection and/or diagnosis of SARS-CoV-2 by FDA under an Emergency Use Authorization (EUA). This EUA will remain in effect (meaning this test can be used) for the duration of the COVID-19 declaration under Section 564(b)(1) of the Act, 21 U.S.C. section 360bbb-3(b)(1), unless the authorization is terminated or revoked.     Resp Syncytial Virus by PCR NEGATIVE NEGATIVE Final    Comment: (NOTE) Fact Sheet for Patients: BloggerCourse.com  Fact Sheet for Healthcare Providers: SeriousBroker.it  This test is not yet approved or cleared by the [place] FDA and has been authorized for detection and/or diagnosis of SARS-CoV-2 by FDA under an Emergency Use Authorization (EUA). This EUA will remain in effect (meaning this test can be used) for the duration of the COVID-19 declaration under Section 564(b)(1) of the Act, 21 U.S.C. section 360bbb-3(b)(1), unless the authorization is terminated or revoked.  Performed at [facility], [address], [place], [place] [address]   Blood culture (routine x 2)     Status: Abnormal   Collection Time: 12/01/22  5:28 PM    Specimen: BLOOD  Result Value Ref Range Status   Specimen Description   Final    BLOOD BLOOD LEFT HAND Performed at [facility], [address], [place], [place] [address]    Special Requests   Final    BOTTLES DRAWN AEROBIC ONLY Blood Culture results may not be optimal due to an excessive volume of blood received in culture bottles Performed at [facility], [address], [place], [place] [address]    Culture  Setup Time   Final    GRAM POSITIVE COCCI AEROBIC BOTTLE ONLY Gram Stain Report Called to,Read Back By and Verified With: E CHEEK AT 1421 ON [id number] BY S DALTON Organism ID to follow    Culture (A)  Final    STAPHYLOCOCCUS CAPITIS THE SIGNIFICANCE OF ISOLATING THIS ORGANISM FROM A SINGLE SET OF BLOOD CULTURES WHEN MULTIPLE SETS ARE DRAWN IS UNCERTAIN. PLEASE NOTIFY THE MICROBIOLOGY DEPARTMENT WITHIN ONE WEEK IF SPECIATION AND SENSITIVITIES ARE REQUIRED. Performed at [facility], [address] [place], [place] [address]    Report Status 12/04/2022 FINAL  Final  Blood Culture ID Panel (Reflexed)     Status: Abnormal   Collection Time: 12/01/22  5:28 PM  Result Value Ref Range Status   Enterococcus faecalis NOT DETECTED NOT DETECTED Final   Enterococcus Faecium NOT DETECTED NOT DETECTED Final   Listeria monocytogenes NOT DETECTED NOT DETECTED Final   Staphylococcus species DETECTED (A) NOT DETECTED Final    Comment: CRITICAL RESULT CALLED TO, READ BACK BY AND VERIFIED WITH: J MUSE,RN@0220  12/03/22 MK    Staphylococcus aureus (BCID) NOT DETECTED NOT DETECTED Final   Staphylococcus epidermidis NOT DETECTED NOT DETECTED Final   Staphylococcus lugdunensis NOT DETECTED NOT DETECTED Final   Streptococcus species NOT DETECTED NOT DETECTED Final   Streptococcus agalactiae NOT DETECTED NOT DETECTED Final   Streptococcus pneumoniae NOT DETECTED NOT DETECTED Final  Streptococcus pyogenes NOT DETECTED NOT DETECTED Final   A.calcoaceticus-baumannii NOT DETECTED NOT DETECTED  Final   Bacteroides fragilis NOT DETECTED NOT DETECTED Final   Enterobacterales NOT DETECTED NOT DETECTED Final   Enterobacter cloacae complex NOT DETECTED NOT DETECTED Final   Escherichia coli NOT DETECTED NOT DETECTED Final   Klebsiella aerogenes NOT DETECTED NOT DETECTED Final   Klebsiella oxytoca NOT DETECTED NOT DETECTED Final   Klebsiella pneumoniae NOT DETECTED NOT DETECTED Final   Proteus species NOT DETECTED NOT DETECTED Final   Salmonella species NOT DETECTED NOT DETECTED Final   Serratia marcescens NOT DETECTED NOT DETECTED Final   Haemophilus influenzae NOT DETECTED NOT DETECTED Final   Neisseria meningitidis NOT DETECTED NOT DETECTED Final   Pseudomonas aeruginosa NOT DETECTED NOT DETECTED Final   Stenotrophomonas maltophilia NOT DETECTED NOT DETECTED Final   Candida albicans NOT DETECTED NOT DETECTED Final   Candida auris NOT DETECTED NOT DETECTED Final   Candida glabrata NOT DETECTED NOT DETECTED Final   Candida krusei NOT DETECTED NOT DETECTED Final   Candida parapsilosis NOT DETECTED NOT DETECTED Final   Candida tropicalis NOT DETECTED NOT DETECTED Final   Cryptococcus neoformans/gattii NOT DETECTED NOT DETECTED Final    Comment: Performed at [facility], [address], [place], [place] [address]  MRSA Next Gen by PCR, Nasal     Status: None   Collection Time: 12/01/22 10:39 PM   Specimen: Nasal Mucosa; Nasal Swab  Result Value Ref Range Status   MRSA by PCR Next Gen NOT DETECTED NOT DETECTED Final    Comment: (NOTE) The GeneXpert MRSA Assay (FDA approved for NASAL specimens only), is one component of a comprehensive MRSA colonization surveillance program. It is not intended to diagnose MRSA infection nor to guide or monitor treatment for MRSA infections. Test performance is not FDA approved in patients less than 79 years old. Performed at [facility], [address], [place], [place] [address]    Radiology Studies: No results found.  Scheduled Meds:   cyanocobalamin  1,000 mcg Intramuscular Daily   digoxin  0.0625 mg Oral Once per day on Monday Wednesday Friday   folic acid  1 mg Oral Daily   metoprolol succinate  50 mg Oral QHS   nystatin   Topical TID   pantoprazole (PROTONIX) IV  40 mg Intravenous Q12H   sacubitril-valsartan  1 tablet Oral BID   simvastatin  20 mg Oral QPM   Continuous Infusions:   LOS: 6 days   Time spent:  Azucena Fallen, DO [facility]  If 7PM-7AM, please contact night-coverage [email or web address]  12/08/2022, 8:24 AM

## 2022-12-08 NOTE — Progress Notes (Signed)
Electrophysiology Progress Note  Patient Name: Terry Hanson Date of Encounter: 12/08/2022  Primary Cardiologist: Marjo Bicker, MD   Subjective   Very frustrated. She wants to go home.  Inpatient Medications    Scheduled Meds:  digoxin  0.0625 mg Oral Once per day on Monday Wednesday Friday   folic acid  1 mg Oral Daily   metoprolol succinate  50 mg Oral QHS   nystatin   Topical TID   pantoprazole (PROTONIX) IV  40 mg Intravenous Q12H   sacubitril-valsartan  1 tablet Oral BID   simvastatin  20 mg Oral QPM   Continuous Infusions:  PRN Meds: acetaminophen **OR** acetaminophen, melatonin, ondansetron **OR** ondansetron (ZOFRAN) IV, mouth rinse   Vital Signs    Vitals:   12/07/22 1937 12/07/22 2205 12/08/22 0303 12/08/22 0306  BP: (!) 107/51 114/82    Pulse: (!) 50 (!) 52    Resp: 18 18 18 20   Temp: 98 F (36.7 C)  98.1 F (36.7 C) 98.1 F (36.7 C)  TempSrc: Oral  Oral Oral  SpO2: 100% 100%    Weight:      Height:        Intake/Output Summary (Last 24 hours) at 12/08/2022 1137 Last data filed at 12/07/2022 1600 Gross per 24 hour  Intake 480 ml  Output --  Net 480 ml   Filed Weights   12/01/22 2250 12/02/22 0500 12/06/22 1808  Weight: 94.1 kg 94.1 kg 92.6 kg    Telemetry    Sinus rhythm, sinus bradycardia - Personally Reviewed  ECG    Sinus rhythm, low voltage - Personally Reviewed  Physical Exam   GEN: No acute distress.   Neck: No JVD Cardiac: RRR, no murmurs, rubs, or gallops.  Respiratory: Clear to auscultation bilaterally. GI: Soft, nontender, non-distended  MS: No edema; No deformity. Neuro:  Nonfocal  Psych: Normal affect   Labs    Chemistry Recent Labs  Lab 12/01/22 2215 12/02/22 0012 12/02/22 0417 12/03/22 0506 12/06/22 0432 12/07/22 0755 12/08/22 0641  NA 134* 134* 133*   < > 135 137 136  K 6.2* 6.0* 4.8   < > 4.4 4.5 4.8  CL 105 104 104   < > 106 112* 108  CO2 22 21* 21*   < > 20* 20* 21*  GLUCOSE 154* 130* 108*    < > 93 123* 116*  BUN 47* 46* 48*   < > 23 22 28*  CREATININE 2.43* 2.31* 2.18*   < > 1.72* 2.09* 2.02*  CALCIUM 8.8* 8.7* 8.4*   < > 8.8* 8.5* 9.0  PROT 6.6 6.3* 6.0*  --   --   --   --   ALBUMIN 3.6 3.3* 3.2*  --   --   --   --   AST 21 19 15   --   --   --   --   ALT 19 18 16   --   --   --   --   ALKPHOS 25* 23* 21*  --   --   --   --   BILITOT 0.7 1.1 0.9  --   --   --   --   GFRNONAA 20* 21* 22*   < > 30* 23* 24*  ANIONGAP 7 9 8    < > 9 5 7    < > = values in this interval not displayed.     Hematology Recent Labs  Lab 12/06/22 0432 12/07/22 0755 12/08/22 0641  WBC 5.6  5.8 6.4  RBC 3.47* 3.33* 3.48*  HGB 10.7* 10.8* 11.4*  HCT 33.9* 32.9* 34.6*  MCV 97.7 98.8 99.4  MCH 30.8 32.4 32.8  MCHC 31.6 32.8 32.9  RDW 14.0 14.2 14.4  PLT 153 159 181    Cardiac EnzymesNo results for input(s): "TROPONINI" in the last 168 hours. No results for input(s): "TROPIPOC" in the last 168 hours.   BNPNo results for input(s): "BNP", "PROBNP" in the last 168 hours.   DDimer No results for input(s): "DDIMER" in the last 168 hours.   Summary of Pertinent studies    TTE 12/02/2022: EF 20 to 25%.  Grade 3 diastolic dysfunction.  Cardiac cath: Last 2021, reviewed  Imaging:   Labs: Reviewed in Epic  Patient Profile     82 y.o. female with a hx of CAD (hx of STEMI, PCI to LAD 2021), chronic CHF (systolic), DM, CKD (III), AFib (ablated 2021 Dr. Elberta Fortis), ICD who is being seen for the evaluation of VT/VF at the request of Dr. Brett Canales.   Assessment & Plan    VT/VF Initial rapid rhythm degenerated to VF with antitachycardia pacing Tracings reviewed; R-R intervals show group beating -- presumed VT but can't exclude 1:1 flutter with Wencke Has a prior history of inappropriate ICD shock for atrial fibrillation with RVR Occurred in the setting of low potassium, low magnesium, and severe anemia She had no LOC with prior inappropriate shock; had syncope this time has a history of poor  tolerance of PO amiodarone I suspect her arrhythmia and syncope were due to electrolyte derangement Would not hold rate slowing medications for asymptomatic bradycardia at rest Ok to proceed with GI eval  Persistent atrial fibrillation Has a history of PVI Deemed not to be a candidate for repeat ablation due to obesity and age Patient has had a prior EP study that did not show any evidence of an accessory pathway (AV WCL 380, HV about 45) No plans for further EP study     For questions or updates, please contact [facility] Please consult [email or web address] for contact info under Cardiology/STEMI.      Signed, Maurice Small, MD 12/08/2022, 11:37 AM

## 2022-12-08 NOTE — Plan of Care (Signed)
  Problem: Clinical Measurements: Goal: Will remain free from infection Outcome: Progressing Goal: Diagnostic test results will improve Outcome: Progressing Goal: Respiratory complications will improve Outcome: Progressing Goal: Cardiovascular complication will be avoided Outcome: Progressing   

## 2022-12-08 NOTE — Progress Notes (Signed)
Mobility Specialist Progress Note:   12/08/22 1100  Mobility  Activity Ambulated with assistance in hallway  Level of Assistance Contact guard assist, steadying assist  Assistive Device Other (Comment) (HHA + Hand Rails)  Distance Ambulated (ft) 360 ft  Activity Response Tolerated well  Mobility Referral Yes  $Mobility charge 1 Mobility  Mobility Specialist Start Time (ACUTE ONLY) 1040  Mobility Specialist Stop Time (ACUTE ONLY) 1054  Mobility Specialist Time Calculation (min) (ACUTE ONLY) 14 min    Pre Mobility: 52 HR During Mobility: 85 HR Post Mobility:  67 HR  Pt received in chair, agreeable to mobility. Took x1 standing rest break and x1 seated rest break d/t fatigue. C/o some back pain. Pt returned to room w/o fault. Left in chair with call bell and all needs met.  D'Vante Earlene Plater Mobility Specialist Please contact via Special educational needs teacher or Rehab office at [phone]

## 2022-12-09 DIAGNOSIS — D539 Nutritional anemia, unspecified: Secondary | ICD-10-CM | POA: Diagnosis not present

## 2022-12-09 DIAGNOSIS — I4901 Ventricular fibrillation: Principal | ICD-10-CM

## 2022-12-09 DIAGNOSIS — R55 Syncope and collapse: Secondary | ICD-10-CM | POA: Diagnosis not present

## 2022-12-09 DIAGNOSIS — E876 Hypokalemia: Secondary | ICD-10-CM | POA: Diagnosis not present

## 2022-12-09 DIAGNOSIS — I4819 Other persistent atrial fibrillation: Secondary | ICD-10-CM | POA: Diagnosis not present

## 2022-12-09 DIAGNOSIS — I5022 Chronic systolic (congestive) heart failure: Secondary | ICD-10-CM | POA: Diagnosis not present

## 2022-12-09 DIAGNOSIS — I2510 Atherosclerotic heart disease of native coronary artery without angina pectoris: Secondary | ICD-10-CM | POA: Diagnosis not present

## 2022-12-09 MED ORDER — APIXABAN 2.5 MG PO TABS
2.5000 mg | ORAL_TABLET | Freq: Two times a day (BID) | ORAL | 2 refills | Status: DC
Start: 2022-12-10 — End: 2023-02-02

## 2022-12-09 MED ORDER — NYSTATIN 100000 UNIT/GM EX POWD: Freq: Three times a day (TID) | CUTANEOUS | 0 refills | Status: DC

## 2022-12-09 MED ORDER — PANTOPRAZOLE SODIUM 40 MG PO TBEC: 40.0000 mg | DELAYED_RELEASE_TABLET | Freq: Every day | ORAL | 1 refills | Status: DC

## 2022-12-09 MED ORDER — DIGOXIN 62.5 MCG PO TABS: 0.0625 mg | ORAL_TABLET | ORAL | 2 refills | Status: DC

## 2022-12-09 NOTE — TOC Transition Note (Signed)
Transition of Care [facility]) - CM/SW Discharge Note   Patient Details  Name: Terry Hanson MRN: [id number] Date of Birth: 07-27-1940  Transition of Care [facility]) CM/SW Contact:  Ronny Bacon, RN Phone Number: 12/09/2022, 11:01 AM   Clinical Narrative:   Patient is being discharged today. Amy- [facility] made aware.    Final next level of care: Home w Home Health Services Barriers to Discharge: No Barriers Identified   Patient Goals and CMS Choice CMS Medicare.gov Compare Post Acute Care list provided to:: Patient Choice offered to / list presented to : Patient  Discharge Placement                         Discharge Plan and Services Additional resources added to the After Visit Summary for   In-house Referral: Clinical Social Work Discharge Planning Services: CM Consult Post Acute Care Choice: Home Health                               Social Determinants of Health (SDOH) Interventions SDOH Screenings   Food Insecurity: No Food Insecurity (12/01/2022)  Housing: Low Risk  (12/01/2022)  Transportation Needs: No Transportation Needs (12/01/2022)  Utilities: Not At Risk (12/01/2022)  Tobacco Use: Medium Risk (12/01/2022)     Readmission Risk Interventions     No data to display

## 2022-12-09 NOTE — Plan of Care (Signed)

## 2022-12-09 NOTE — Consult Note (Addendum)
Consultation  Referring Provider:     Dr. Natale Milch Primary Care Physician:  Pcp, No Primary Gastroenterologist:       none Reason for Consultation:     Anemia         HPI:   Terry Hanson is a 83 y.o. female with a history of chronic systolic heart failure status post ICD, atrial fibrillation status post PVI (2023), coronary artery disease status post stent (2021) and CKD 3 who was admitted to [facility] September 14 after experiencing cardiac syncopal episode, treated with CPR in the field with ROSC achieved after 5 rounds..  She presented with severe hypokalemia (2.2) and a hemoglobin of 6.  She received 1 units PRBC, and her hemoglobin has been stable at 10-11 since then.  She was transferred to [facility] for EP evaluation.  Her cardiac syncope was felt to be most likely related to her electrolyte abnormalities and no additional treatment or evaluation was recommended.  She was initially going to undergo an EGD and colonoscopy at [facility], but was felt to be too high risk, and further endoscopic evaluation was deferred to GI here at [facility].  The patient denies seeing any blood in her stool prior to that, but states that her daughter said that she saw some blood the day of admission. She has not seen blood in her stool since she has been in the hospital.  A fecal occult blood test was negative on admission. She denies any history of chronic GI symptoms to include abdominal pain, diarrhea, constipation.  She has infrequent heartburn for which she takes Rolaids.  Otherwise, no upper GI symptoms such as nausea, vomiting, early satiety, dysphagia.  She reports having a colonoscopy many years ago, but does not know exactly when.  Past Medical History:  Diagnosis Date   Arthritis    Asthma    Chronic HFrEF (heart failure with reduced ejection fraction) (HCC)    Chronic kidney disease, stage 3b (HCC)    Diabetes mellitus (HCC)    Hypercholesterolemia    Hyperlipidemia     Ischemic cardiomyopathy    STEMI (ST elevation myocardial infarction) (HCC)    a. 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    Past Surgical History:  Procedure Laterality Date   ABDOMINAL HYSTERECTOMY     ATRIAL FIBRILLATION ABLATION N/A 04/05/2021   Procedure: ATRIAL FIBRILLATION ABLATION;  Surgeon: Regan Lemming, MD;  Location: [facility] INVASIVE CV LAB;  Service: Cardiovascular;  Laterality: N/A;   CATARACT EXTRACTION W/PHACO Right 06/27/2020   Procedure: CATARACT EXTRACTION PHACO AND INTRAOCULAR LENS PLACEMENT RIGHT EYE AND PLACEMENT OF CORTICOSTEROID;  Surgeon: Fabio Pierce, MD;  Location: [facility] ORS;  Service: Ophthalmology;  Laterality: Right;  right CDE=25.47   CORONARY/GRAFT ACUTE MI REVASCULARIZATION N/A 06/29/2019   Procedure: Coronary/Graft Acute MI Revascularization;  Surgeon: Lyn Records, MD;  Location: [facility] INVASIVE CV LAB;  Service: Cardiovascular;  Laterality: N/A;   ICD IMPLANT N/A 12/14/2019   Procedure: ICD IMPLANT;  Surgeon: Marinus Maw, MD;  Location: [facility] INVASIVE CV LAB;  Service: Cardiovascular;  Laterality: N/A;   LEFT HEART CATH AND CORONARY ANGIOGRAPHY N/A 06/29/2019   Procedure: LEFT HEART CATH AND CORONARY ANGIOGRAPHY;  Surgeon: Lyn Records, MD;  Location: [facility] INVASIVE CV LAB;  Service: Cardiovascular;  Laterality: N/A;   RIGHT HEART CATH N/A 12/30/2020   Procedure: RIGHT HEART CATH;  Surgeon: Laurey Morale, MD;  Location: [facility] INVASIVE CV LAB;  Service: Cardiovascular;  Laterality: N/A;  TEE WITHOUT CARDIOVERSION N/A 04/04/2021   Procedure: TRANSESOPHAGEAL ECHOCARDIOGRAM (TEE);  Surgeon: Jake Bathe, MD;  Location: [facility] ENDOSCOPY;  Service: Cardiovascular;  Laterality: N/A;    Family History  Problem Relation Age of Onset   Heart disease Mother      Social History   Tobacco Use   Smoking status: Never   Smokeless tobacco: Former    Types: Chew   Tobacco comments:    Former chew tobacco 05/03/21  Vaping Use   Vaping status: Never Used   Substance Use Topics   Alcohol use: Not Currently   Drug use: Never    Prior to Admission medications   Medication Sig Start Date End Date Taking? Authorizing Provider  apixaban (ELIQUIS) 2.5 MG TABS tablet Take 1 tablet (2.5 mg total) by mouth 2 (two) times daily. 06/29/22  Yes Mallipeddi, Vishnu P, MD  digoxin (LANOXIN) 0.125 MG tablet Take 0.5 tablets (0.0625 mg total) by mouth 3 (three) times a week. Taking Monday, Wednesday, and Friday. 04/18/22  Yes Lenze, Michele M, PA-C  FARXIGA 10 MG TABS tablet TAKE ONE TABLET (10 MG TOTAL) BY MOUTH DAILY AT 9 AM BEFORE BREAKFAST Patient taking differently: Take 10 mg by mouth daily. 08/30/22  Yes Mallipeddi, Vishnu P, MD  furosemide (LASIX) 20 MG tablet Take 1 tablet (20 mg total) by mouth daily. 05/01/22  Yes Laurey Morale, MD  metoprolol succinate (TOPROL-XL) 50 MG 24 hr tablet TAKE 1 TABLET(50 MG) BY MOUTH AT BEDTIME Patient taking differently: Take 50 mg by mouth at bedtime. 05/01/22  Yes Laurey Morale, MD  nitroGLYCERIN (NITROSTAT) 0.4 MG SL tablet Place 1 tablet (0.4 mg total) under the tongue every 5 (five) minutes as needed. 07/02/19  Yes Arty Baumgartner, NP  potassium chloride (KLOR-CON) 10 MEQ tablet Take 1 tablet (10 mEq total) by mouth 2 (two) times daily. 07/27/22  Yes Mallipeddi, Vishnu P, MD  sacubitril-valsartan (ENTRESTO) 24-26 MG Take 1 tablet by mouth 2 (two) times daily. 05/30/22  Yes Mallipeddi, Vishnu P, MD  simvastatin (ZOCOR) 20 MG tablet TAKE 1 TABLET(20 MG) BY MOUTH EVERY EVENING Patient taking differently: Take 20 mg by mouth every evening. 05/01/22  Yes Laurey Morale, MD  spironolactone (ALDACTONE) 25 MG tablet Take 1 tablet (25 mg total) by mouth at bedtime. 05/01/22  Yes Laurey Morale, MD    Current Facility-Administered Medications  Medication Dose Route Frequency Provider Last Rate Last Admin   acetaminophen (TYLENOL) tablet 650 mg  650 mg Oral Q6H PRN Vassie Loll, MD   650 mg at 12/08/22 8295   Or    acetaminophen (TYLENOL) suppository 650 mg  650 mg Rectal Q6H PRN Vassie Loll, MD       digoxin Margit Banda) tablet 0.0625 mg  0.0625 mg Oral Once per day on Monday Wednesday Friday Vassie Loll, MD   0.0625 mg at 12/05/22 1124   folic acid (FOLVITE) tablet 1 mg  1 mg Oral Daily Vassie Loll, MD   1 mg at 12/08/22 0951   melatonin tablet 5 mg  5 mg Oral QHS PRN Dow Adolph N, DO   5 mg at 12/07/22 2210   metoprolol succinate (TOPROL-XL) 24 hr tablet 50 mg  50 mg Oral QHS Vassie Loll, MD   50 mg at 12/08/22 2138   nystatin (MYCOSTATIN/NYSTOP) topical powder   Topical TID Vassie Loll, MD   Given at 12/08/22 2140   ondansetron (ZOFRAN) tablet 4 mg  4 mg Oral Q6H PRN Vassie Loll, MD  Or   ondansetron (ZOFRAN) injection 4 mg  4 mg Intravenous Q6H PRN Vassie Loll, MD       Oral care mouth rinse  15 mL Mouth Rinse PRN Vassie Loll, MD       pantoprazole (PROTONIX) injection 40 mg  40 mg Intravenous Q12H Vassie Loll, MD   40 mg at 12/08/22 2137   sacubitril-valsartan (ENTRESTO) 24-26 mg per tablet  1 tablet Oral BID Mallipeddi, Vishnu P, MD   1 tablet at 12/08/22 2138   simvastatin (ZOCOR) tablet 20 mg  20 mg Oral QPM Vassie Loll, MD   20 mg at 12/08/22 1732    Allergies as of 12/01/2022 - Review Complete 12/01/2022  Allergen Reaction Noted   Iodinated contrast media Shortness Of Breath 12/01/2022   Codeine Hives 06/29/2019   Shrimp [shellfish allergy] Other (See Comments) 07/27/2019   Amiodarone Nausea And Vomiting 01/05/2021   Penicillins Itching and Rash 05/27/2018   Sulfa antibiotics Itching 06/29/2019     Review of Systems:    As per HPI, otherwise negative    Physical Exam:  Vital signs in last 24 hours: Temp:  [97.8 F (36.6 C)] 97.8 F (36.6 C) (09/21 1950) Pulse Rate:  [62-69] 69 (09/21 2258) Resp:  [18] 18 (09/21 2258) BP: (108-129)/(46-98) 108/46 (09/21 2138) SpO2:  [99 %] 99 % (09/21 2258) Last BM Date : 12/04/22 General:   Pleasant Caucasian  female in NAD, lying in bedside chair Head:  Normocephalic and atraumatic. Eyes:   No icterus.   Conjunctiva pink. Ears:  Normal auditory acuity. Neck:  Supple Lungs:  Respirations even and unlabored. Lungs clear to auscultation bilaterally.   No wheezes, crackles, or rhonchi.  Heart:  Regular rate and rhythm; no MRG Abdomen:  Soft, obese, nondistended, nontender. Normal bowel sounds. No appreciable masses or hepatomegaly.  Rectal:  Not performed.  Msk:  Symmetrical without gross deformities.  Extremities:  Trace bilateral edema. Neurologic:  Alert and  oriented x4;  grossly normal neurologically. Skin:  Intact without significant lesions or rashes. Psych:  Alert and cooperative. Normal affect.  LAB RESULTS: Recent Labs    12/07/22 0755 12/08/22 0641  WBC 5.8 6.4  HGB 10.8* 11.4*  HCT 32.9* 34.6*  PLT 159 181   BMET Recent Labs    12/07/22 0755 12/08/22 0641  NA 137 136  K 4.5 4.8  CL 112* 108  CO2 20* 21*  GLUCOSE 123* 116*  BUN 22 28*  CREATININE 2.09* 2.02*  CALCIUM 8.5* 9.0   LFT No results for input(s): "PROT", "ALBUMIN", "AST", "ALT", "ALKPHOS", "BILITOT", "BILIDIR", "IBILI" in the last 72 hours. PT/INR No results for input(s): "LABPROT", "INR" in the last 72 hours.  STUDIES: No results found.   PREVIOUS ENDOSCOPIES:            Remote colonoscopy, report not available   Impression / Plan:   82 year old female with extensive cardiac history as outlined above, admitted following cardiac syncope, found to have hemoglobin of 6, down from baseline of 12-13.  No clear history of overt GI bleeding and fecal occult blood test was negative.  She received 1 units PRBCs and her hemoglobin has been 10-11 since then. The patient does not appear to be having any active GI bleeding.  The hemoglobin of 6 on admission was likely not accurate, as the patient received only 1 unit of blood, and her hemoglobin has been stable at 10-11 since then.   Although the patient has  been cleared for endoscopic evaluation by  cardiology, she does still remain a significant risk given her numerous comorbidities.  In the absence of any clear GI bleeding prior to presentation and during this hospitalization, stable hemoglobin, negative FOBT, there is not an absolute indication to proceed with an EGD and colonoscopy at this time. The patient would prefer not to unless it is absolutely necessary. I think it is very reasonable to continue to observe her blood counts as an outpatient.  If she does have further evidence of GI bleeding, then we can reconsider an EGD and colonoscopy as an outpatient. Okay to resume anticoagulation from GI standpoint.   Anemia, normocytic - Stable for over a week at 10-12, negative FOBT, no clear overt bleeding; suspect presenting hemoglobin of 6 to be inaccurate - Will defer endoscopic evaluation at this time given lack of GI bleeding and high risk for complications from sedation - Outpatient follow-up with PCP to trend hemoglobin.  If hemoglobin drops significantly as outpatient, then reconsider outpatient endoscopic evaluation.  VT/VF - Likely secondary to electrolyte abnormalities, no further evaluation per cardiology  Atrial fibrillation - On metoprolol, digoxin being held - Okay to resume anticoagulation from GI standpoint  Chronic systolic heart failure -On Aldactone, Lasix and Entresto  Okay to discharge home from GI standpoint.  Thanks   LOS: 7 days   Jenel Lucks  12/09/2022, 8:43 AM

## 2022-12-09 NOTE — Discharge Summary (Signed)
Physician Discharge Summary  Terry Hanson UEA:[id number] DOB: 1941/01/18 DOA: 12/01/2022  PCP: Oneita Hurt, No  Admit date: 12/01/2022 Discharge date: 12/09/2022  Admitted From: Home Disposition: Home  Recommendations for Outpatient Follow-up:  Follow up with PCP in 1-2 weeks Follow-up with cardiology and GI as scheduled  Home Health: None Equipment/Devices: None  Discharge Condition: Stable CODE STATUS: Full Diet recommendation: Low-salt low-fat low-carb diet as tolerated  Brief/Interim Summary: Terry Hanson is a 82 y.o. female with medical history significant of chronic systolic CHF, atrial fibrillation, CAD s/p stent placement (DES to proximal LAD 06/2019), CKD 3 who presents to the emergency department from home via EMS due to syncope -CPR performed in the field, ROSC after 5 rounds per report.  Admitted to hospitalist service for further evaluation treatment of questionable cardiac event, cardiology called to consult.  At some point during intake patient had notable bright red blood per rectum concerning for GI bleed -as such GI was called in consult to help rule out any ongoing active bleed.  Patient's home Eliquis has been held.  Patient admitted as above after episode of syncope with reported ROSC and CPR in the field with episode of AICD firing.  Cardiology consulted consider the event p.m. VT versus V-fib.  EP consulted and recommended transfer to [facility] for further evaluation and treatment.  GI initially consulted as well given episode of hemoptysis with questionable need for further endoscopy.  Upon transfer to [facility], EP was able to evaluate patient and discussed her history notably concerned for inappropriate ICD shock for A-fib with RVR, this would be the second episode of inappropriate firing.  Given electrolyte derangements and anemia at intake likely presumed dysrhythmia.  Regardless given improvement in heart rate with correction of anemia and electrolytes no further workup or  evaluation necessary per EP.  GI subsequently evaluated patient after cardiac clearance for possible endoscopy however in the interim patient's hemoglobin has remained stable if not upward trending as appropriate without any further issues or symptoms of bleeding.  Recommend holding off on any further procedure at this time, patient is otherwise stable and agreeable for discharge home, follow-up outpatient with PCP, cardiology and GI as scheduled.  *Only medication change during hospitalization was discontinuation of potassium given admission with hyperkalemia while on spironolactone.  Repeat potassium in the next 1 to 2 weeks per PCP to ensure stable labs.  Cardiology agreeable to continue low-dose Eliquis given patient's age and renal function.    Discharge Diagnoses:  Principal Problem:   Cardiac syncope Active Problems:   Chronic systolic CHF (congestive heart failure) (HCC)   CAD (coronary artery disease)   Stage 3 chronic kidney disease (HCC)   Polymorphic ventricular tachycardia (HCC)   Fall at home, initial encounter   Hypomagnesemia   Hypokalemia   Hypocalcemia   Symptomatic anemia   Thrombocytopenia (HCC)   Macrocytic anemia   Persistent atrial fibrillation (HCC)   Obesity (BMI 30-39.9)   Anemia   Ventricular fibrillation (HCC)   Syncopal event, consistent with PMVT> V-fib requiring ICD shock -EP evaluated, no further indication for testing or procedure, continue medications as below with discontinuation of potassium; patient follow-up as scheduled   Systolic congestive heart failure -Compensated, continue home medications  Questionable GI bleed, acute, transient - Bright red blood noted per rectum at prior facility  - Status post 1 unit PRBC 9/14 -Hemoglobin stabilizing, GI consulted recommending no further procedure given patient is not a low risk candidate and she has had no further  episodes of bleeding.  Outpatient follow-up as appropriate    Hyperlipidemia -Continue statin -Heart healthy diet discussed with patient.   Morbid obesity Body mass index is 37.33 kg/m. -Control and increase physical activity discussed with patient.   Hypertension -currently hypotensive -Blood pressure improving, resume home medications as below   AKI on CKD 3B, resolving -Baseline creatinine around 1.7 -Creatinine downtrending appropriately currently 2.1   Elevated troponin -Likely demand ischemia in the setting of above, no further EKG changes -ACS ruled out, no further indications for cardiac workup or evaluation   Paroxysmal atrial fibrillation history -Continue home medications, digoxin level within normal limits, okay to resume anticoagulation per cardiology.  Patient educated to immediately stop anticoagulation and report to her PCP or the hospital if she has further episodes of bleeding.   Macrocytosis/B12 deficiency -Continue to supplement in the outpatient setting -Repeat B12 level as an outpatient in 12 weeks.  Discharge Instructions   Allergies as of 12/09/2022       Reactions   Iodinated Contrast Media Shortness Of Breath   Per Patient- The last time she had contrast during a scan she had difficulty breathing   Codeine Hives   Shrimp [shellfish Allergy] Other (See Comments)   Broke out   Amiodarone Nausea And Vomiting   Penicillins Itching, Rash   Did it involve swelling of the face/tongue/throat, SOB, or low BP? Yes Did it involve sudden or severe rash/hives, skin peeling, or any reaction on the inside of your mouth or nose? Yes Did you need to seek medical attention at a hospital or doctor's office? Yes When did it last happen? Over 10 years       If all above answers are "NO", may proceed with cephalosporin use.   Sulfa Antibiotics Itching        Medication List     STOP taking these medications    potassium chloride 10 MEQ tablet Commonly known as: KLOR-CON       TAKE these medications    apixaban  2.5 MG Tabs tablet Commonly known as: ELIQUIS Take 1 tablet (2.5 mg total) by mouth 2 (two) times daily. Start taking on: December 10, 2022   Digoxin 62.5 MCG Tabs Take 0.0625 mg by mouth 3 (three) times a week. Taking Monday, Wednesday, and Friday. Start taking on: December 10, 2022 What changed: medication strength   Entresto 24-26 MG Generic drug: sacubitril-valsartan Take 1 tablet by mouth 2 (two) times daily.   Farxiga 10 MG Tabs tablet Generic drug: dapagliflozin propanediol TAKE ONE TABLET (10 MG TOTAL) BY MOUTH DAILY AT 9 AM BEFORE BREAKFAST What changed: See the new instructions.   furosemide 20 MG tablet Commonly known as: Lasix Take 1 tablet (20 mg total) by mouth daily.   metoprolol succinate 50 MG 24 hr tablet Commonly known as: TOPROL-XL TAKE 1 TABLET(50 MG) BY MOUTH AT BEDTIME What changed:  how much to take how to take this when to take this additional instructions   nitroGLYCERIN 0.4 MG SL tablet Commonly known as: Nitrostat Place 1 tablet (0.4 mg total) under the tongue every 5 (five) minutes as needed.   nystatin powder Commonly known as: MYCOSTATIN/NYSTOP Apply topically 3 (three) times daily.   pantoprazole 40 MG tablet Commonly known as: Protonix Take 1 tablet (40 mg total) by mouth daily.   simvastatin 20 MG tablet Commonly known as: ZOCOR TAKE 1 TABLET(20 MG) BY MOUTH EVERY EVENING What changed:  how much to take how to take this when to take this  additional instructions   spironolactone 25 MG tablet Commonly known as: ALDACTONE Take 1 tablet (25 mg total) by mouth at bedtime.        Follow-up Information     [facility] Follow up.   Why: Will contact you to schedule home health visits.               Allergies  Allergen Reactions   Iodinated Contrast Media Shortness Of Breath    Per Patient- The last time she had contrast during a scan she had difficulty breathing   Codeine Hives   Shrimp [Shellfish  Allergy] Other (See Comments)    Broke out   Amiodarone Nausea And Vomiting   Penicillins Itching and Rash    Did it involve swelling of the face/tongue/throat, SOB, or low BP? Yes Did it involve sudden or severe rash/hives, skin peeling, or any reaction on the inside of your mouth or nose? Yes Did you need to seek medical attention at a hospital or doctor's office? Yes When did it last happen? Over 10 years       If all above answers are "NO", may proceed with cephalosporin use.    Sulfa Antibiotics Itching    Consultations: Cardiology, EP, GI  Procedures/Studies: CUP PACEART REMOTE DEVICE CHECK  Result Date: 12/02/2022 Scheduled remote reviewed. Normal device function.  8 logged [facility] detections since last clinic visit, 5 device-classified SVT, all occurring on 11/01/22.  EGMs consistent with AF with RVR based on irregular R-R and morphology discrimination. Previously routed to triage, metoprolol dose increased.  Known history of AF, on Eliquis.  History of RVR episodes with inappropriate therapy, programming changes made in clinic, on digoxin and metoprolol per Epic.  Within the monitoring period, HF diagnostics have  been abnormal.  Histogram shows predominant HR between 100-110 bpm. Next remote 91 days. - CS, CVRS  ECHOCARDIOGRAM COMPLETE  Result Date: 12/02/2022    ECHOCARDIOGRAM REPORT   Patient Name:   KATERIN REPINSKI Buonomo Date of Exam: 12/02/2022 Medical Rec #:  [id number]   Height:       62.0 in Accession #:    [id number]  Weight:       207.5 lb Date of Birth:  1940-11-22    BSA:          1.942 m Patient Age:    82 years    BP:           128/36 mmHg Patient Gender: F           HR:           77 bpm. [facility] Procedure: 2D Echo, Cardiac Doppler, Color Doppler and Intracardiac            Opacification Agent                                MODIFIED REPORT:    This report was modified by Thomasene Ripple DO on 12/02/2022 due to diastolic                                  dysfunction.   Indications:     Syncope  History:         Patient has prior history of Echocardiogram examinations, most                  recent 05/24/2022.  Cardiomyopathy, Pacemaker; Risk                  Factors:Diabetes.  Sonographer:     Darlys Gales Referring Phys:  [id number] OLADAPO ADEFESO Diagnosing Phys: Thomasene Ripple DO  Sonographer Comments: Technically difficult study due to poor echo windows. Image acquisition challenging due to patient body habitus. IMPRESSIONS  1. Left ventricular ejection fraction, by estimation, is 20 to 25%. The left ventricle has severely decreased function. The left ventricle demonstrates regional wall motion abnormalities (see scoring diagram/findings for description). Left ventricular diastolic parameters are consistent with Grade III diastolic dysfunction (restrictive). Elevated left atrial pressure.  2. Right ventricular systolic function is normal. The right ventricular size is normal.  3. The mitral valve is normal in structure. No evidence of mitral valve regurgitation. No evidence of mitral stenosis.  4. The aortic valve is normal in structure. Aortic valve regurgitation is not visualized. Mild to moderate aortic valve stenosis. Aortic valve area, by VTI measures 1.13 cm. Aortic valve mean gradient measures 8.0 mmHg. Aortic valve Vmax measures 1.76 m/s.  5. The inferior vena cava is normal in size with greater than 50% respiratory variability, suggesting right atrial pressure of 3 mmHg. FINDINGS  Left Ventricle: Left ventricular ejection fraction, by estimation, is 20 to 25%. The left ventricle has severely decreased function. The left ventricle demonstrates regional wall motion abnormalities. Definity contrast agent was given IV to delineate the left ventricular endocardial borders. The left ventricular internal cavity size was normal in size. There is no left ventricular hypertrophy. Left ventricular diastolic parameters are consistent with Grade III diastolic dysfunction (restrictive).  Elevated left atrial pressure.  LV Wall Scoring: The apical anterior segment, apical inferior segment, and apex are akinetic. The anterior wall, entire lateral wall, entire septum, and inferior wall are hypokinetic. Right Ventricle: The right ventricular size is normal. No increase in right ventricular wall thickness. Right ventricular systolic function is normal. Left Atrium: Left atrial size was normal in size. Right Atrium: Right atrial size was normal in size. Pericardium: There is no evidence of pericardial effusion. Presence of epicardial fat layer. Mitral Valve: The mitral valve is normal in structure. Mild mitral annular calcification. No evidence of mitral valve regurgitation. No evidence of mitral valve stenosis. MV peak gradient, 8.4 mmHg. The mean mitral valve gradient is 3.0 mmHg. Tricuspid Valve: The tricuspid valve is normal in structure. Tricuspid valve regurgitation is not demonstrated. No evidence of tricuspid stenosis. Aortic Valve: The aortic valve is normal in structure. Aortic valve regurgitation is not visualized. Mild to moderate aortic stenosis is present. Aortic valve mean gradient measures 8.0 mmHg. Aortic valve peak gradient measures 12.5 mmHg. Aortic valve area, by VTI measures 1.13 cm. Pulmonic Valve: The pulmonic valve was normal in structure. Pulmonic valve regurgitation is not visualized. No evidence of pulmonic stenosis. Aorta: The aortic root is normal in size and structure. Venous: The inferior vena cava is normal in size with greater than 50% respiratory variability, suggesting right atrial pressure of 3 mmHg. IAS/Shunts: No atrial level shunt detected by color flow Doppler.  LEFT VENTRICLE PLAX 2D LVIDd:         4.60 cm   Diastology LVIDs:         3.90 cm   LV e' medial:    5.11 cm/s LV PW:         1.00 cm   LV E/e' medial:  23.5 LV IVS:        0.80 cm   LV e'  lateral:   8.16 cm/s LVOT diam:     1.80 cm   LV E/e' lateral: 14.7 LV SV:         45 LV SV Index:   23 LVOT Area:      2.54 cm  RIGHT VENTRICLE RV S prime:     8.81 cm/s LEFT ATRIUM             Index LA Vol (A2C):   39.1 ml 20.14 ml/m LA Vol (A4C):   43.4 ml 22.35 ml/m LA Biplane Vol: 42.0 ml 21.63 ml/m  AORTIC VALVE AV Area (Vmax):    1.14 cm AV Area (Vmean):   1.06 cm AV Area (VTI):     1.13 cm AV Vmax:           176.50 cm/s AV Vmean:          133.500 cm/s AV VTI:            0.394 m AV Peak Grad:      12.5 mmHg AV Mean Grad:      8.0 mmHg LVOT Vmax:         79.00 cm/s LVOT Vmean:        55.600 cm/s LVOT VTI:          0.175 m LVOT/AV VTI ratio: 0.44  AORTA Ao Asc diam: 2.90 cm MITRAL VALVE                TRICUSPID VALVE MV Area (PHT): 2.99 cm     TR Peak grad:   37.9 mmHg MV Area VTI:   1.27 cm     TR Vmax:        308.00 cm/s MV Peak grad:  8.4 mmHg MV Mean grad:  3.0 mmHg     SHUNTS MV Vmax:       1.45 m/s     Systemic VTI:  0.18 m MV Vmean:      85.4 cm/s    Systemic Diam: 1.80 cm MV Decel Time: 254 msec MV E velocity: 120.00 cm/s MV A velocity: 42.80 cm/s MV E/A ratio:  2.80 Kardie Tobb DO Electronically signed by Thomasene Ripple DO Signature Date/Time: 12/02/2022/12:53:42 PM    Final (Updated)    US Carotid Bilateral  Result Date: 12/02/2022 CLINICAL DATA:  Syncope, visual disturbance, hypertension and hyperlipidemia. EXAM: BILATERAL CAROTID DUPLEX ULTRASOUND TECHNIQUE: Wallace Cullens scale imaging, color Doppler and duplex ultrasound were performed of bilateral carotid and vertebral arteries in the neck. COMPARISON:  None Available. FINDINGS: Criteria: Quantification of carotid stenosis is based on velocity parameters that correlate the residual internal carotid diameter with NASCET-based stenosis levels, using the diameter of the distal internal carotid lumen as the denominator for stenosis measurement. The following velocity measurements were obtained: RIGHT ICA:  53/14 cm/sec CCA:  59/11 cm/sec SYSTOLIC ICA/CCA RATIO:  0.9 ECA:  73 cm/sec LEFT ICA:  93/16 cm/sec CCA:  41/9 cm/sec SYSTOLIC ICA/CCA RATIO:  2.3 ECA:  75 cm/sec  RIGHT CAROTID ARTERY: Mild to moderate calcified plaque at the level of the carotid bulb and proximal right ICA. Estimated right ICA stenosis is less than 50%. RIGHT VERTEBRAL ARTERY: Antegrade flow with normal waveform and velocity. LEFT CAROTID ARTERY: Mild to moderate calcified plaque at the level of the carotid bulb and proximal left ICA. Estimated left ICA stenosis is less than 50%. LEFT VERTEBRAL ARTERY: Sample portion of the left vertebral artery in the neck demonstrates retrograde flow. IMPRESSION: 1. Mild to moderate calcified plaque at the level of both  carotid bulbs and proximal internal carotid arteries. Estimated bilateral ICA stenoses are less than 50%. 2. Retrograde flow in the sampled portion of the left vertebral artery in the neck. 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 Electronically Signed   By: Irish Lack M.D.   On: 12/02/2022 11:18   CT ABDOMEN PELVIS WO CONTRAST  Result Date: 12/01/2022 CLINICAL DATA:  Recent CPR, anemia EXAM: CT ABDOMEN AND PELVIS WITHOUT CONTRAST TECHNIQUE: Multidetector CT imaging of the abdomen and pelvis was performed following the standard protocol without IV contrast. RADIATION DOSE REDUCTION: This exam was performed according to the departmental dose-optimization program which includes automated exposure control, adjustment of the mA and/or kV according to patient size and/or use of iterative reconstruction technique. COMPARISON:  None Available. FINDINGS: Lower chest: No acute abnormality. Hepatobiliary: Liver is within normal limits. Gallbladder is well distended with dependent density without calcification. This may represent poorly calcified stones or gallbladder sludge. No biliary dilatation is seen. Pancreas: Unremarkable. No pancreatic ductal dilatation or surrounding inflammatory changes. Spleen: Normal in size without focal abnormality. Adrenals/Urinary  Tract: Adrenal glands are within normal limits. Kidneys demonstrate a normal appearance bilaterally. No calculi or obstructive changes are seen. The bladder is partially distended. Stomach/Bowel: Diverticular change of the colon is noted without evidence of diverticulitis. The appendix is within normal limits. Small bowel and stomach are unremarkable. Vascular/Lymphatic: Aortic atherosclerosis. No enlarged abdominal or pelvic lymph nodes. Reproductive: Status post hysterectomy. No adnexal masses. Other: No abdominal wall hernia or abnormality. No abdominopelvic ascites. Musculoskeletal: No acute or significant osseous findings. IMPRESSION: Diverticulosis without diverticulitis. Dependent density within the gallbladder which may represent small stones or gallbladder sludge. No other focal abnormality is noted. Electronically Signed   By: Alcide Clever M.D.   On: 12/01/2022 19:43   CT Head Wo Contrast  Result Date: 12/01/2022 CLINICAL DATA:  [facility] called for CPR that was initiated by pt's daughter, began to do chest compressions and felt a pop to chest, pt began to breathe per report. EMS arrived and found pt disoriented on arrival, en route pt began to be more alert and oriented. Pt is A&O to age, place and year. Pt denies any pain currently. Hypotensive 70/40 per EMS. 20 G placed by ems to right hand. EXAM: CT HEAD WITHOUT CONTRAST CT CERVICAL SPINE WITHOUT CONTRAST TECHNIQUE: Multidetector CT imaging of the head and cervical spine was performed following the standard protocol without intravenous contrast. Multiplanar CT image reconstructions of the cervical spine were also generated. RADIATION DOSE REDUCTION: This exam was performed according to the departmental dose-optimization program which includes automated exposure control, adjustment of the mA and/or kV according to patient size and/or use of iterative reconstruction technique. COMPARISON:  Head CT, 05/27/2018. FINDINGS: CT HEAD FINDINGS Brain: No  evidence of acute infarction, hemorrhage, hydrocephalus, extra-axial collection or mass lesion/mass effect. 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  Age-appropriate volume loss. Vascular: No hyperdense vessel or unexpected calcification. Skull: Normal. Negative for fracture or focal lesion. Sinuses/Orbits: Globes and orbits are unremarkable. The visualized sinuses are clear. Other: None. CT CERVICAL SPINE FINDINGS Alignment: Straightened cervical lordosis.  No spondylolisthesis. Skull base and vertebrae: No acute fracture. No primary bone lesion or focal pathologic process. Soft tissues and spinal canal: No prevertebral fluid or swelling. No visible canal hematoma. Disc levels: Minor loss of disc height at C4-C5. Moderate loss of disc height at C5-C6. Mild disc bulging and endplate spurring most evident at C5-C6.  No convincing disc herniation. No significant stenosis. Upper chest: No acute or significant abnormality. Other: None. IMPRESSION: HEAD CT 1. No acute intracranial abnormality CERVICAL CT 1. No fracture or acute finding. Electronically Signed   By: Amie Portland M.D.   On: 12/01/2022 18:05   CT Cervical Spine Wo Contrast  Result Date: 12/01/2022 CLINICAL DATA:  [facility] called for CPR that was initiated by pt's daughter, began to do chest compressions and felt a pop to chest, pt began to breathe per report. EMS arrived and found pt disoriented on arrival, en route pt began to be more alert and oriented. Pt is A&O to age, place and year. Pt denies any pain currently. Hypotensive 70/40 per EMS. 20 G placed by ems to right hand. EXAM: CT HEAD WITHOUT CONTRAST CT CERVICAL SPINE WITHOUT CONTRAST TECHNIQUE: Multidetector CT imaging of the head and cervical spine was performed following the standard protocol without intravenous contrast. Multiplanar CT image reconstructions of the cervical spine were also generated. RADIATION DOSE REDUCTION: This exam was performed according to the departmental dose-optimization  program which includes automated exposure control, adjustment of the mA and/or kV according to patient size and/or use of iterative reconstruction technique. COMPARISON:  Head CT, 05/27/2018. FINDINGS: CT HEAD FINDINGS Brain: No evidence of acute infarction, hemorrhage, hydrocephalus, extra-axial collection or mass lesion/mass effect. 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  Age-appropriate volume loss. Vascular: No hyperdense vessel or unexpected calcification. Skull: Normal. Negative for fracture or focal lesion. Sinuses/Orbits: Globes and orbits are unremarkable. The visualized sinuses are clear. Other: None. CT CERVICAL SPINE FINDINGS Alignment: Straightened cervical lordosis.  No spondylolisthesis. Skull base and vertebrae: No acute fracture. No primary bone lesion or focal pathologic process. Soft tissues and spinal canal: No prevertebral fluid or swelling. No visible canal hematoma. Disc levels: Minor loss of disc height at C4-C5. Moderate loss of disc height at C5-C6. Mild disc bulging and endplate spurring most evident at C5-C6. No convincing disc herniation. No significant stenosis. Upper chest: No acute or significant abnormality. Other: None. IMPRESSION: HEAD CT 1. No acute intracranial abnormality CERVICAL CT 1. No fracture or acute finding. Electronically Signed   By: Amie Portland M.D.   On: 12/01/2022 18:05   DG Chest Portable 1 View  Result Date: 12/01/2022 CLINICAL DATA:  EMS called for CPR initiated by patient's daughter. During chest compressions, felt a pop to the chest. The patient then began breathing. Patient found disoriented on arrival. EXAM: PORTABLE CHEST 1 VIEW COMPARISON:  03/19/2020. FINDINGS: Cardiac silhouette borderline enlarged. Stable left anterior chest wall AICD. No mediastinal or hilar masses. Clear lungs.  No convincing pleural effusion or pneumothorax. Skeletal structures grossly intact. IMPRESSION: No active disease. Electronically Signed   By: Amie Portland M.D.   On:  12/01/2022 17:14     Subjective: No acute issues or events overnight denies nausea vomiting diarrhea constipation headache fevers chills chest pain palpitations shortness of breath orthopnea.   Discharge Exam: Vitals:   12/08/22 2239 12/08/22 2258  BP:    Pulse: 66 69  Resp:  18  Temp:    SpO2: 99% 99%   Vitals:   12/08/22 1950 12/08/22 2138 12/08/22 2239 12/08/22 2258  BP: (!) 129/98 (!) 108/46    Pulse:  62 66 69  Resp: 18   18  Temp: 97.8 F (36.6 C)     TempSrc: Oral     SpO2:   99% 99%  Weight:      Height:  General: Pt is alert, awake, not in acute distress Cardiovascular: Irregularly irregular, no rubs, no gallops Respiratory: CTA bilaterally, no wheezing, no rhonchi Abdominal: Soft, NT, ND, bowel sounds + Extremities: no edema, no cyanosis    The results of significant diagnostics from this hospitalization (including imaging, microbiology, ancillary and laboratory) are listed below for reference.     Microbiology: Recent Results (from the past 240 hour(s))  Blood culture (routine x 2)     Status: Abnormal   Collection Time: 12/01/22  4:32 PM   Specimen: BLOOD  Result Value Ref Range Status   Specimen Description   Final    BLOOD RIGHT ANTECUBITAL Performed at [facility], [address], [place], [place] [address]    Special Requests   Final    BOTTLES DRAWN AEROBIC AND ANAEROBIC Blood Culture results may not be optimal due to an excessive volume of blood received in culture bottles Performed at [facility], [address], [place], [place] [address]    Culture  Setup Time   Final    GRAM POSITIVE COCCI ANAEROBIC BOTTLE ONLY Gram Stain Report Called to,Read Back By and Verified With: N JACKSON AT 1649 ON [id number] BY S DALTON CRITICAL RESULT CALLED TO, READ BACK BY AND VERIFIED WITH: J MUSE,RN@0219  12/03/22 MK    Culture (A)  Final    STAPHYLOCOCCUS EPIDERMIDIS THE SIGNIFICANCE OF ISOLATING THIS ORGANISM FROM A SINGLE SET OF BLOOD CULTURES  WHEN MULTIPLE SETS ARE DRAWN IS UNCERTAIN. PLEASE NOTIFY THE MICROBIOLOGY DEPARTMENT WITHIN ONE WEEK IF SPECIATION AND SENSITIVITIES ARE REQUIRED. Performed at [facility], [address], [place], [place] [address]    Report Status 12/05/2022 FINAL  Final  Resp panel by RT-PCR (RSV, Flu A&B, Covid) Anterior Nasal Swab     Status: None   Collection Time: 12/01/22  4:35 PM   Specimen: Anterior Nasal Swab  Result Value Ref Range Status   SARS Coronavirus 2 by RT PCR NEGATIVE NEGATIVE Final    Comment: (NOTE) SARS-CoV-2 target nucleic acids are NOT DETECTED.  The SARS-CoV-2 RNA is generally detectable in upper respiratory specimens during the acute phase of infection. The lowest concentration of SARS-CoV-2 viral copies this assay can detect is 138 copies/mL. A negative result does not preclude SARS-Cov-2 infection and should not be used as the sole basis for treatment or other patient management decisions. A negative result may occur with  improper specimen collection/handling, submission of specimen other than nasopharyngeal swab, presence of viral mutation(s) within the areas targeted by this assay, and inadequate number of viral copies(<138 copies/mL). A negative result must be combined with clinical observations, patient history, and epidemiological information. The expected result is Negative.  Fact Sheet for Patients:  BloggerCourse.com  Fact Sheet for Healthcare Providers:  SeriousBroker.it  This test is no t yet approved or cleared by the [place] FDA and  has been authorized for detection and/or diagnosis of SARS-CoV-2 by FDA under an Emergency Use Authorization (EUA). This EUA will remain  in effect (meaning this test can be used) for the duration of the COVID-19 declaration under Section 564(b)(1) of the Act, 21 U.S.C.section 360bbb-3(b)(1), unless the authorization is terminated  or revoked sooner.        Influenza A by PCR NEGATIVE NEGATIVE Final   Influenza B by PCR NEGATIVE NEGATIVE Final    Comment: (NOTE) The Xpert Xpress SARS-CoV-2/FLU/RSV plus assay is intended as an aid in the diagnosis of influenza from Nasopharyngeal swab specimens and should not be used as  a sole basis for treatment. Nasal washings and aspirates are unacceptable for Xpert Xpress SARS-CoV-2/FLU/RSV testing.  Fact Sheet for Patients: BloggerCourse.com  Fact Sheet for Healthcare Providers: SeriousBroker.it  This test is not yet approved or cleared by the [place] FDA and has been authorized for detection and/or diagnosis of SARS-CoV-2 by FDA under an Emergency Use Authorization (EUA). This EUA will remain in effect (meaning this test can be used) for the duration of the COVID-19 declaration under Section 564(b)(1) of the Act, 21 U.S.C. section 360bbb-3(b)(1), unless the authorization is terminated or revoked.     Resp Syncytial Virus by PCR NEGATIVE NEGATIVE Final    Comment: (NOTE) Fact Sheet for Patients: BloggerCourse.com  Fact Sheet for Healthcare Providers: SeriousBroker.it  This test is not yet approved or cleared by the [place] FDA and has been authorized for detection and/or diagnosis of SARS-CoV-2 by FDA under an Emergency Use Authorization (EUA). This EUA will remain in effect (meaning this test can be used) for the duration of the COVID-19 declaration under Section 564(b)(1) of the Act, 21 U.S.C. section 360bbb-3(b)(1), unless the authorization is terminated or revoked.  Performed at [facility], [address], [place], [place] [address]   Blood culture (routine x 2)     Status: Abnormal   Collection Time: 12/01/22  5:28 PM   Specimen: BLOOD  Result Value Ref Range Status   Specimen Description   Final    BLOOD BLOOD LEFT HAND Performed at [facility], [address],  [place], [place] [address]    Special Requests   Final    BOTTLES DRAWN AEROBIC ONLY Blood Culture results may not be optimal due to an excessive volume of blood received in culture bottles Performed at [facility], [address], [place], [place] [address]    Culture  Setup Time   Final    GRAM POSITIVE COCCI AEROBIC BOTTLE ONLY Gram Stain Report Called to,Read Back By and Verified With: E CHEEK AT 1421 ON [id number] BY S DALTON Organism ID to follow    Culture (A)  Final    STAPHYLOCOCCUS CAPITIS THE SIGNIFICANCE OF ISOLATING THIS ORGANISM FROM A SINGLE SET OF BLOOD CULTURES WHEN MULTIPLE SETS ARE DRAWN IS UNCERTAIN. PLEASE NOTIFY THE MICROBIOLOGY DEPARTMENT WITHIN ONE WEEK IF SPECIATION AND SENSITIVITIES ARE REQUIRED. Performed at [facility], [address] [place], [place] [address]    Report Status 12/04/2022 FINAL  Final  Blood Culture ID Panel (Reflexed)     Status: Abnormal   Collection Time: 12/01/22  5:28 PM  Result Value Ref Range Status   Enterococcus faecalis NOT DETECTED NOT DETECTED Final   Enterococcus Faecium NOT DETECTED NOT DETECTED Final   Listeria monocytogenes NOT DETECTED NOT DETECTED Final   Staphylococcus species DETECTED (A) NOT DETECTED Final    Comment: CRITICAL RESULT CALLED TO, READ BACK BY AND VERIFIED WITH: J MUSE,RN@0220  12/03/22 MK    Staphylococcus aureus (BCID) NOT DETECTED NOT DETECTED Final   Staphylococcus epidermidis NOT DETECTED NOT DETECTED Final   Staphylococcus lugdunensis NOT DETECTED NOT DETECTED Final   Streptococcus species NOT DETECTED NOT DETECTED Final   Streptococcus agalactiae NOT DETECTED NOT DETECTED Final   Streptococcus pneumoniae NOT DETECTED NOT DETECTED Final   Streptococcus pyogenes NOT DETECTED NOT DETECTED Final   A.calcoaceticus-baumannii NOT DETECTED NOT DETECTED Final   Bacteroides fragilis NOT DETECTED NOT DETECTED Final   Enterobacterales NOT DETECTED NOT DETECTED Final   Enterobacter cloacae complex NOT  DETECTED NOT DETECTED Final  Escherichia coli NOT DETECTED NOT DETECTED Final   Klebsiella aerogenes NOT DETECTED NOT DETECTED Final   Klebsiella oxytoca NOT DETECTED NOT DETECTED Final   Klebsiella pneumoniae NOT DETECTED NOT DETECTED Final   Proteus species NOT DETECTED NOT DETECTED Final   Salmonella species NOT DETECTED NOT DETECTED Final   Serratia marcescens NOT DETECTED NOT DETECTED Final   Haemophilus influenzae NOT DETECTED NOT DETECTED Final   Neisseria meningitidis NOT DETECTED NOT DETECTED Final   Pseudomonas aeruginosa NOT DETECTED NOT DETECTED Final   Stenotrophomonas maltophilia NOT DETECTED NOT DETECTED Final   Candida albicans NOT DETECTED NOT DETECTED Final   Candida auris NOT DETECTED NOT DETECTED Final   Candida glabrata NOT DETECTED NOT DETECTED Final   Candida krusei NOT DETECTED NOT DETECTED Final   Candida parapsilosis NOT DETECTED NOT DETECTED Final   Candida tropicalis NOT DETECTED NOT DETECTED Final   Cryptococcus neoformans/gattii NOT DETECTED NOT DETECTED Final    Comment: Performed at [facility], [address] [place], [place] [address]  MRSA Next Gen by PCR, Nasal     Status: None   Collection Time: 12/01/22 10:39 PM   Specimen: Nasal Mucosa; Nasal Swab  Result Value Ref Range Status   MRSA by PCR Next Gen NOT DETECTED NOT DETECTED Final    Comment: (NOTE) The GeneXpert MRSA Assay (FDA approved for NASAL specimens only), is one component of a comprehensive MRSA colonization surveillance program. It is not intended to diagnose MRSA infection nor to guide or monitor treatment for MRSA infections. Test performance is not FDA approved in patients less than 56 years old. Performed at [facility], [address], [place], [place] [address]      Labs: BNP (last 3 results) No results for input(s): "BNP" in the last 8760 hours. Basic Metabolic Panel: Recent Labs  Lab 12/03/22 0506 12/04/22 0838 12/06/22 0432 12/07/22 0755 12/08/22 0641   NA 135 134* 135 137 136  K 4.6 4.3 4.4 4.5 4.8  CL 106 103 106 112* 108  CO2 20* 20* 20* 20* 21*  GLUCOSE 148* 91 93 123* 116*  BUN 41* 37* 23 22 28*  CREATININE 2.12* 2.13* 1.72* 2.09* 2.02*  CALCIUM 8.7* 8.6* 8.8* 8.5* 9.0  MG 2.6*  --  2.3  --   --    Liver Function Tests: No results for input(s): "AST", "ALT", "ALKPHOS", "BILITOT", "PROT", "ALBUMIN" in the last 168 hours. No results for input(s): "LIPASE", "AMYLASE" in the last 168 hours. No results for input(s): "AMMONIA" in the last 168 hours. CBC: Recent Labs  Lab 12/03/22 0506 12/04/22 0838 12/06/22 0432 12/07/22 0755 12/08/22 0641  WBC 10.4 7.1 5.6 5.8 6.4  HGB 11.9* 11.0* 10.7* 10.8* 11.4*  HCT 37.2 33.3* 33.9* 32.9* 34.6*  MCV 98.4 97.9 97.7 98.8 99.4  PLT 143* 142* 153 159 181   Cardiac Enzymes: No results for input(s): "CKTOTAL", "CKMB", "CKMBINDEX", "TROPONINI" in the last 168 hours. BNP: Invalid input(s): "POCBNP" CBG: Recent Labs  Lab 12/06/22 1811 12/06/22 2105 12/08/22 0721 12/08/22 1148  GLUCAP 116* 139* 108* 96   D-Dimer No results for input(s): "DDIMER" in the last 72 hours. Hgb A1c Recent Labs    12/08/22 0641  HGBA1C 6.2*   Lipid Profile No results for input(s): "CHOL", "HDL", "LDLCALC", "TRIG", "CHOLHDL", "LDLDIRECT" in the last 72 hours. Thyroid function studies No results for input(s): "TSH", "T4TOTAL", "T3FREE", "THYROIDAB" in the last 72 hours.  Invalid input(s): "FREET3" Anemia work up No results for input(s): "VITAMINB12", "FOLATE", "FERRITIN", "  TIBC", "IRON", "RETICCTPCT" in the last 72 hours. Urinalysis    Component Value Date/Time   COLORURINE YELLOW 12/01/2022 1809   APPEARANCEUR HAZY (A) 12/01/2022 1809   LABSPEC 1.013 12/01/2022 1809   PHURINE 5.0 12/01/2022 1809   GLUCOSEU >=500 (A) 12/01/2022 1809   HGBUR MODERATE (A) 12/01/2022 1809   BILIRUBINUR NEGATIVE 12/01/2022 1809   KETONESUR NEGATIVE 12/01/2022 1809   PROTEINUR NEGATIVE 12/01/2022 1809   NITRITE  NEGATIVE 12/01/2022 1809   LEUKOCYTESUR NEGATIVE 12/01/2022 1809   Sepsis Labs Recent Labs  Lab 12/04/22 0838 12/06/22 0432 12/07/22 0755 12/08/22 0641  WBC 7.1 5.6 5.8 6.4   Microbiology Recent Results (from the past 240 hour(s))  Blood culture (routine x 2)     Status: Abnormal   Collection Time: 12/01/22  4:32 PM   Specimen: BLOOD  Result Value Ref Range Status   Specimen Description   Final    BLOOD RIGHT ANTECUBITAL Performed at [facility], [address] [place], [place] [address]    Special Requests   Final    BOTTLES DRAWN AEROBIC AND ANAEROBIC Blood Culture results may not be optimal due to an excessive volume of blood received in culture bottles Performed at [facility], [address], [place], [place] [address]    Culture  Setup Time   Final    GRAM POSITIVE COCCI ANAEROBIC BOTTLE ONLY Gram Stain Report Called to,Read Back By and Verified With: N JACKSON AT 1649 ON [id number] BY S DALTON CRITICAL RESULT CALLED TO, READ BACK BY AND VERIFIED WITH: J MUSE,RN@0219  12/03/22 MK    Culture (A)  Final    STAPHYLOCOCCUS EPIDERMIDIS THE SIGNIFICANCE OF ISOLATING THIS ORGANISM FROM A SINGLE SET OF BLOOD CULTURES WHEN MULTIPLE SETS ARE DRAWN IS UNCERTAIN. PLEASE NOTIFY THE MICROBIOLOGY DEPARTMENT WITHIN ONE WEEK IF SPECIATION AND SENSITIVITIES ARE REQUIRED. Performed at [facility], [address] [place], [place] [address]    Report Status 12/05/2022 FINAL  Final  Resp panel by RT-PCR (RSV, Flu A&B, Covid) Anterior Nasal Swab     Status: None   Collection Time: 12/01/22  4:35 PM   Specimen: Anterior Nasal Swab  Result Value Ref Range Status   SARS Coronavirus 2 by RT PCR NEGATIVE NEGATIVE Final    Comment: (NOTE) SARS-CoV-2 target nucleic acids are NOT DETECTED.  The SARS-CoV-2 RNA is generally detectable in upper respiratory specimens during the acute phase of infection. The lowest concentration of SARS-CoV-2 viral copies this assay can detect is 138  copies/mL. A negative result does not preclude SARS-Cov-2 infection and should not be used as the sole basis for treatment or other patient management decisions. A negative result may occur with  improper specimen collection/handling, submission of specimen other than nasopharyngeal swab, presence of viral mutation(s) within the areas targeted by this assay, and inadequate number of viral copies(<138 copies/mL). A negative result must be combined with clinical observations, patient history, and epidemiological information. The expected result is Negative.  Fact Sheet for Patients:  BloggerCourse.com  Fact Sheet for Healthcare Providers:  SeriousBroker.it  This test is no t yet approved or cleared by the [place] FDA and  has been authorized for detection and/or diagnosis of SARS-CoV-2 by FDA under an Emergency Use Authorization (EUA). This EUA will remain  in effect (meaning this test can be used) for the duration of the COVID-19 declaration under Section 564(b)(1) of the Act, 21 U.S.C.section 360bbb-3(b)(1), unless the authorization is terminated  or revoked sooner.  Influenza A by PCR NEGATIVE NEGATIVE Final   Influenza B by PCR NEGATIVE NEGATIVE Final    Comment: (NOTE) The Xpert Xpress SARS-CoV-2/FLU/RSV plus assay is intended as an aid in the diagnosis of influenza from Nasopharyngeal swab specimens and should not be used as a sole basis for treatment. Nasal washings and aspirates are unacceptable for Xpert Xpress SARS-CoV-2/FLU/RSV testing.  Fact Sheet for Patients: BloggerCourse.com  Fact Sheet for Healthcare Providers: SeriousBroker.it  This test is not yet approved or cleared by the [place] FDA and has been authorized for detection and/or diagnosis of SARS-CoV-2 by FDA under an Emergency Use Authorization (EUA). This EUA will remain in effect (meaning  this test can be used) for the duration of the COVID-19 declaration under Section 564(b)(1) of the Act, 21 U.S.C. section 360bbb-3(b)(1), unless the authorization is terminated or revoked.     Resp Syncytial Virus by PCR NEGATIVE NEGATIVE Final    Comment: (NOTE) Fact Sheet for Patients: BloggerCourse.com  Fact Sheet for Healthcare Providers: SeriousBroker.it  This test is not yet approved or cleared by the [place] FDA and has been authorized for detection and/or diagnosis of SARS-CoV-2 by FDA under an Emergency Use Authorization (EUA). This EUA will remain in effect (meaning this test can be used) for the duration of the COVID-19 declaration under Section 564(b)(1) of the Act, 21 U.S.C. section 360bbb-3(b)(1), unless the authorization is terminated or revoked.  Performed at [facility], [address], [place], [place] [address]   Blood culture (routine x 2)     Status: Abnormal   Collection Time: 12/01/22  5:28 PM   Specimen: BLOOD  Result Value Ref Range Status   Specimen Description   Final    BLOOD BLOOD LEFT HAND Performed at [facility], [address], [place], [place] [address]    Special Requests   Final    BOTTLES DRAWN AEROBIC ONLY Blood Culture results may not be optimal due to an excessive volume of blood received in culture bottles Performed at [facility], [address] [place], [place] [address]    Culture  Setup Time   Final    GRAM POSITIVE COCCI AEROBIC BOTTLE ONLY Gram Stain Report Called to,Read Back By and Verified With: E CHEEK AT 1421 ON [id number] BY S DALTON Organism ID to follow    Culture (A)  Final    STAPHYLOCOCCUS CAPITIS THE SIGNIFICANCE OF ISOLATING THIS ORGANISM FROM A SINGLE SET OF BLOOD CULTURES WHEN MULTIPLE SETS ARE DRAWN IS UNCERTAIN. PLEASE NOTIFY THE MICROBIOLOGY DEPARTMENT WITHIN ONE WEEK IF SPECIATION AND SENSITIVITIES ARE REQUIRED. Performed at [facility], [address], [place], [place] [address]    Report Status 12/04/2022 FINAL  Final  Blood Culture ID Panel (Reflexed)     Status: Abnormal   Collection Time: 12/01/22  5:28 PM  Result Value Ref Range Status   Enterococcus faecalis NOT DETECTED NOT DETECTED Final   Enterococcus Faecium NOT DETECTED NOT DETECTED Final   Listeria monocytogenes NOT DETECTED NOT DETECTED Final   Staphylococcus species DETECTED (A) NOT DETECTED Final    Comment: CRITICAL RESULT CALLED TO, READ BACK BY AND VERIFIED WITH: J MUSE,RN@0220  12/03/22 MK    Staphylococcus aureus (BCID) NOT DETECTED NOT DETECTED Final   Staphylococcus epidermidis NOT DETECTED NOT DETECTED Final   Staphylococcus lugdunensis NOT DETECTED NOT DETECTED Final   Streptococcus species NOT DETECTED NOT DETECTED Final   Streptococcus agalactiae NOT DETECTED NOT DETECTED Final   Streptococcus pneumoniae NOT DETECTED NOT DETECTED  Final   Streptococcus pyogenes NOT DETECTED NOT DETECTED Final   A.calcoaceticus-baumannii NOT DETECTED NOT DETECTED Final   Bacteroides fragilis NOT DETECTED NOT DETECTED Final   Enterobacterales NOT DETECTED NOT DETECTED Final   Enterobacter cloacae complex NOT DETECTED NOT DETECTED Final   Escherichia coli NOT DETECTED NOT DETECTED Final   Klebsiella aerogenes NOT DETECTED NOT DETECTED Final   Klebsiella oxytoca NOT DETECTED NOT DETECTED Final   Klebsiella pneumoniae NOT DETECTED NOT DETECTED Final   Proteus species NOT DETECTED NOT DETECTED Final   Salmonella species NOT DETECTED NOT DETECTED Final   Serratia marcescens NOT DETECTED NOT DETECTED Final   Haemophilus influenzae NOT DETECTED NOT DETECTED Final   Neisseria meningitidis NOT DETECTED NOT DETECTED Final   Pseudomonas aeruginosa NOT DETECTED NOT DETECTED Final   Stenotrophomonas maltophilia NOT DETECTED NOT DETECTED Final   Candida albicans NOT DETECTED NOT DETECTED Final   Candida auris NOT DETECTED NOT DETECTED Final   Candida glabrata NOT DETECTED NOT  DETECTED Final   Candida krusei NOT DETECTED NOT DETECTED Final   Candida parapsilosis NOT DETECTED NOT DETECTED Final   Candida tropicalis NOT DETECTED NOT DETECTED Final   Cryptococcus neoformans/gattii NOT DETECTED NOT DETECTED Final    Comment: Performed at [facility], [address] [place], [place] [address]  MRSA Next Gen by PCR, Nasal     Status: None   Collection Time: 12/01/22 10:39 PM   Specimen: Nasal Mucosa; Nasal Swab  Result Value Ref Range Status   MRSA by PCR Next Gen NOT DETECTED NOT DETECTED Final    Comment: (NOTE) The GeneXpert MRSA Assay (FDA approved for NASAL specimens only), is one component of a comprehensive MRSA colonization surveillance program. It is not intended to diagnose MRSA infection nor to guide or monitor treatment for MRSA infections. Test performance is not FDA approved in patients less than 36 years old. Performed at [facility], [address], [place], [place] [address]      Time coordinating discharge: Over 30 minutes  SIGNED:   Azucena Fallen, DO [facility] 12/09/2022, 12:29 PM Pager   If 7PM-7AM, please contact night-coverage [email or web address]

## 2022-12-09 NOTE — Progress Notes (Signed)
Electrophysiology Progress Note  Patient Name: Terry Hanson Date of Encounter: 12/09/2022  Primary Cardiologist: Marjo Bicker, MD   Subjective   Very frustrated. She wants to go home.  Inpatient Medications    Scheduled Meds:  digoxin  0.0625 mg Oral Once per day on Monday Wednesday Friday   folic acid  1 mg Oral Daily   metoprolol succinate  50 mg Oral QHS   nystatin   Topical TID   pantoprazole (PROTONIX) IV  40 mg Intravenous Q12H   sacubitril-valsartan  1 tablet Oral BID   simvastatin  20 mg Oral QPM   Continuous Infusions:  PRN Meds: acetaminophen **OR** acetaminophen, melatonin, ondansetron **OR** ondansetron (ZOFRAN) IV, mouth rinse   Vital Signs    Vitals:   12/08/22 1950 12/08/22 2138 12/08/22 2239 12/08/22 2258  BP: (!) 129/98 (!) 108/46    Pulse:  62 66 69  Resp: 18   18  Temp: 97.8 F (36.6 C)     TempSrc: Oral     SpO2:   99% 99%  Weight:      Height:       No intake or output data in the 24 hours ending 12/09/22 1018  Filed Weights   12/01/22 2250 12/02/22 0500 12/06/22 1808  Weight: 94.1 kg 94.1 kg 92.6 kg    Telemetry    Sinus rhythm, sinus bradycardia - Personally Reviewed  ECG    Sinus rhythm, low voltage - Personally Reviewed  Physical Exam   GEN: No acute distress.   Neck: No JVD Cardiac: RRR, no murmurs, rubs, or gallops.  Respiratory: Clear to auscultation bilaterally. GI: Soft, nontender, non-distended  MS: No edema; No deformity. Neuro:  Nonfocal  Psych: Normal affect   Labs    Chemistry Recent Labs  Lab 12/06/22 0432 12/07/22 0755 12/08/22 0641  NA 135 137 136  K 4.4 4.5 4.8  CL 106 112* 108  CO2 20* 20* 21*  GLUCOSE 93 123* 116*  BUN 23 22 28*  CREATININE 1.72* 2.09* 2.02*  CALCIUM 8.8* 8.5* 9.0  GFRNONAA 30* 23* 24*  ANIONGAP 9 5 7      Hematology Recent Labs  Lab 12/06/22 0432 12/07/22 0755 12/08/22 0641  WBC 5.6 5.8 6.4  RBC 3.47* 3.33* 3.48*  HGB 10.7* 10.8* 11.4*  HCT 33.9* 32.9*  34.6*  MCV 97.7 98.8 99.4  MCH 30.8 32.4 32.8  MCHC 31.6 32.8 32.9  RDW 14.0 14.2 14.4  PLT 153 159 181    Cardiac EnzymesNo results for input(s): "TROPONINI" in the last 168 hours. No results for input(s): "TROPIPOC" in the last 168 hours.   BNPNo results for input(s): "BNP", "PROBNP" in the last 168 hours.   DDimer No results for input(s): "DDIMER" in the last 168 hours.   Summary of Pertinent studies    TTE 12/02/2022: EF 20 to 25%.  Grade 3 diastolic dysfunction.  Cardiac cath: Last 2021, reviewed  Imaging:   Labs: Reviewed in Epic  Patient Profile     82 y.o. female with a hx of CAD (hx of STEMI, PCI to LAD 2021), chronic CHF (systolic), DM, CKD (III), AFib (ablated 2021 Dr. Elberta Fortis), ICD who is being seen for the evaluation of VT/VF at the request of Dr. Brett Canales.   Assessment & Plan    VT/VF Initial rapid rhythm degenerated to VF with antitachycardia pacing Tracings reviewed; R-R intervals show group beating -- presumed VT but can't exclude 1:1 flutter with Wencke Has a prior history of inappropriate ICD  shock for atrial fibrillation with RVR Occurred in the setting of low potassium, low magnesium, and severe anemia She had no LOC with prior inappropriate shock; had syncope this time has a history of poor tolerance of PO amiodarone I suspect her arrhythmia and syncope were due to electrolyte derangement Would not hold rate slowing medications for asymptomatic bradycardia at rest   Persistent atrial fibrillation Has a history of PVI Deemed not to be a candidate for repeat ablation due to obesity and age Patient has had a prior EP study that did not show any evidence of an accessory pathway (AV WCL 380, HV about 45) No plans for further EP study Continue eliquis 2.5mg  BID (reduced dose due to age and CKD) Continue metoprolol and digoxin (dig level appropriate) -- she has an ICD that can provide back-up pacing if necessary  Anemia on presentation Hb of 6 on  presentation thought to be spurious due to repeat levels of 10-11 after 1 unit of pRBCs FOBT negative Per GI, risk of endoscopy outweigh benefit  I do not see further hospital benefit from the EP standpoint. We will sign off.  For questions or updates, please contact [facility] Please consult [email or web address] for contact info under Cardiology/STEMI.      Signed, Maurice Small, MD 12/09/2022, 10:18 AM

## 2022-12-12 DIAGNOSIS — I5022 Chronic systolic (congestive) heart failure: Secondary | ICD-10-CM | POA: Diagnosis not present

## 2022-12-12 DIAGNOSIS — I2510 Atherosclerotic heart disease of native coronary artery without angina pectoris: Secondary | ICD-10-CM | POA: Diagnosis not present

## 2022-12-12 DIAGNOSIS — Z7984 Long term (current) use of oral hypoglycemic drugs: Secondary | ICD-10-CM | POA: Diagnosis not present

## 2022-12-12 DIAGNOSIS — N1831 Chronic kidney disease, stage 3a: Secondary | ICD-10-CM | POA: Diagnosis not present

## 2022-12-12 DIAGNOSIS — R55 Syncope and collapse: Secondary | ICD-10-CM | POA: Diagnosis not present

## 2022-12-12 DIAGNOSIS — E1122 Type 2 diabetes mellitus with diabetic chronic kidney disease: Secondary | ICD-10-CM | POA: Diagnosis not present

## 2022-12-12 DIAGNOSIS — I4819 Other persistent atrial fibrillation: Secondary | ICD-10-CM | POA: Diagnosis not present

## 2022-12-12 DIAGNOSIS — W19XXXD Unspecified fall, subsequent encounter: Secondary | ICD-10-CM | POA: Diagnosis not present

## 2022-12-12 DIAGNOSIS — Z7901 Long term (current) use of anticoagulants: Secondary | ICD-10-CM | POA: Diagnosis not present

## 2022-12-12 DIAGNOSIS — D594 Other nonautoimmune hemolytic anemias: Secondary | ICD-10-CM | POA: Diagnosis not present

## 2022-12-13 ENCOUNTER — Telehealth: Payer: Self-pay | Admitting: Internal Medicine

## 2022-12-13 ENCOUNTER — Encounter: Payer: Self-pay | Admitting: Internal Medicine

## 2022-12-13 DIAGNOSIS — Z9181 History of falling: Secondary | ICD-10-CM

## 2022-12-13 DIAGNOSIS — Z7689 Persons encountering health services in other specified circumstances: Secondary | ICD-10-CM

## 2022-12-13 NOTE — Telephone Encounter (Signed)
Error

## 2022-12-13 NOTE — Telephone Encounter (Signed)
Caller [employer]) stated patient does not have a PCP and is requesting orders for patient to have physical therapy once per week for 8 weeks.

## 2022-12-13 NOTE — Telephone Encounter (Signed)
Will forward to Dr. Jenene Slicker.

## 2022-12-14 NOTE — Telephone Encounter (Signed)
Referral placed for PCP. Verbal order given for PT. [facility] will send orders to be signed by provider.

## 2022-12-17 NOTE — Progress Notes (Signed)
Remote ICD transmission.   

## 2022-12-20 ENCOUNTER — Other Ambulatory Visit (HOSPITAL_COMMUNITY): Payer: Self-pay | Admitting: Cardiology

## 2022-12-20 ENCOUNTER — Encounter: Payer: Self-pay | Admitting: Internal Medicine

## 2022-12-20 DIAGNOSIS — I4819 Other persistent atrial fibrillation: Secondary | ICD-10-CM | POA: Diagnosis not present

## 2022-12-20 DIAGNOSIS — I5022 Chronic systolic (congestive) heart failure: Secondary | ICD-10-CM | POA: Diagnosis not present

## 2022-12-20 DIAGNOSIS — R55 Syncope and collapse: Secondary | ICD-10-CM | POA: Diagnosis not present

## 2022-12-20 DIAGNOSIS — Z7984 Long term (current) use of oral hypoglycemic drugs: Secondary | ICD-10-CM | POA: Diagnosis not present

## 2022-12-20 DIAGNOSIS — W19XXXD Unspecified fall, subsequent encounter: Secondary | ICD-10-CM | POA: Diagnosis not present

## 2022-12-20 DIAGNOSIS — I2510 Atherosclerotic heart disease of native coronary artery without angina pectoris: Secondary | ICD-10-CM | POA: Diagnosis not present

## 2022-12-20 DIAGNOSIS — E1122 Type 2 diabetes mellitus with diabetic chronic kidney disease: Secondary | ICD-10-CM | POA: Diagnosis not present

## 2022-12-20 DIAGNOSIS — Z7901 Long term (current) use of anticoagulants: Secondary | ICD-10-CM | POA: Diagnosis not present

## 2022-12-20 DIAGNOSIS — N1831 Chronic kidney disease, stage 3a: Secondary | ICD-10-CM | POA: Diagnosis not present

## 2022-12-20 DIAGNOSIS — D594 Other nonautoimmune hemolytic anemias: Secondary | ICD-10-CM | POA: Diagnosis not present

## 2022-12-20 MED ORDER — SIMVASTATIN 20 MG PO TABS: ORAL_TABLET | ORAL | 3 refills | Status: DC

## 2023-01-08 ENCOUNTER — Inpatient Hospital Stay (HOSPITAL_COMMUNITY)
Admission: EM | Admit: 2023-01-08 | Discharge: 2023-01-10 | DRG: 564 | Disposition: A | Discharge status: Home Health | Payer: No Typology Code available for payment source | Attending: Family Medicine | Admitting: Family Medicine

## 2023-01-08 ENCOUNTER — Emergency Department (HOSPITAL_COMMUNITY): Payer: No Typology Code available for payment source

## 2023-01-08 ENCOUNTER — Other Ambulatory Visit: Payer: Self-pay

## 2023-01-08 ENCOUNTER — Encounter (HOSPITAL_COMMUNITY): Payer: Self-pay | Admitting: *Deleted

## 2023-01-08 DIAGNOSIS — Z882 Allergy status to sulfonamides status: Secondary | ICD-10-CM

## 2023-01-08 DIAGNOSIS — S0990XA Unspecified injury of head, initial encounter: Secondary | ICD-10-CM | POA: Diagnosis not present

## 2023-01-08 DIAGNOSIS — Z23 Encounter for immunization: Secondary | ICD-10-CM | POA: Diagnosis not present

## 2023-01-08 DIAGNOSIS — I5022 Chronic systolic (congestive) heart failure: Secondary | ICD-10-CM | POA: Diagnosis not present

## 2023-01-08 DIAGNOSIS — N183 Chronic kidney disease, stage 3 unspecified: Secondary | ICD-10-CM | POA: Diagnosis present

## 2023-01-08 DIAGNOSIS — Z961 Presence of intraocular lens: Secondary | ICD-10-CM | POA: Diagnosis present

## 2023-01-08 DIAGNOSIS — Z95810 Presence of automatic (implantable) cardiac defibrillator: Secondary | ICD-10-CM | POA: Diagnosis not present

## 2023-01-08 DIAGNOSIS — D72829 Elevated white blood cell count, unspecified: Secondary | ICD-10-CM | POA: Diagnosis present

## 2023-01-08 DIAGNOSIS — M6282 Rhabdomyolysis: Secondary | ICD-10-CM | POA: Diagnosis present

## 2023-01-08 DIAGNOSIS — E875 Hyperkalemia: Secondary | ICD-10-CM

## 2023-01-08 DIAGNOSIS — Z91013 Allergy to seafood: Secondary | ICD-10-CM

## 2023-01-08 DIAGNOSIS — I517 Cardiomegaly: Secondary | ICD-10-CM | POA: Diagnosis not present

## 2023-01-08 DIAGNOSIS — E669 Obesity, unspecified: Secondary | ICD-10-CM | POA: Diagnosis present

## 2023-01-08 DIAGNOSIS — G9389 Other specified disorders of brain: Secondary | ICD-10-CM | POA: Diagnosis not present

## 2023-01-08 DIAGNOSIS — J45909 Unspecified asthma, uncomplicated: Secondary | ICD-10-CM | POA: Diagnosis present

## 2023-01-08 DIAGNOSIS — Z6835 Body mass index (BMI) 35.0-35.9, adult: Secondary | ICD-10-CM

## 2023-01-08 DIAGNOSIS — W19XXXA Unspecified fall, initial encounter: Secondary | ICD-10-CM | POA: Diagnosis not present

## 2023-01-08 DIAGNOSIS — Z885 Allergy status to narcotic agent status: Secondary | ICD-10-CM

## 2023-01-08 DIAGNOSIS — S199XXA Unspecified injury of neck, initial encounter: Secondary | ICD-10-CM | POA: Diagnosis not present

## 2023-01-08 DIAGNOSIS — K5730 Diverticulosis of large intestine without perforation or abscess without bleeding: Secondary | ICD-10-CM | POA: Diagnosis not present

## 2023-01-08 DIAGNOSIS — S2243XD Multiple fractures of ribs, bilateral, subsequent encounter for fracture with routine healing: Secondary | ICD-10-CM | POA: Diagnosis not present

## 2023-01-08 DIAGNOSIS — M25519 Pain in unspecified shoulder: Secondary | ICD-10-CM | POA: Diagnosis not present

## 2023-01-08 DIAGNOSIS — S299XXA Unspecified injury of thorax, initial encounter: Secondary | ICD-10-CM | POA: Diagnosis not present

## 2023-01-08 DIAGNOSIS — Z91041 Radiographic dye allergy status: Secondary | ICD-10-CM

## 2023-01-08 DIAGNOSIS — Z7901 Long term (current) use of anticoagulants: Secondary | ICD-10-CM

## 2023-01-08 DIAGNOSIS — I2510 Atherosclerotic heart disease of native coronary artery without angina pectoris: Secondary | ICD-10-CM | POA: Diagnosis present

## 2023-01-08 DIAGNOSIS — Z880 Allergy status to penicillin: Secondary | ICD-10-CM

## 2023-01-08 DIAGNOSIS — T148XXA Other injury of unspecified body region, initial encounter: Secondary | ICD-10-CM | POA: Diagnosis not present

## 2023-01-08 DIAGNOSIS — Z8249 Family history of ischemic heart disease and other diseases of the circulatory system: Secondary | ICD-10-CM

## 2023-01-08 DIAGNOSIS — Z90710 Acquired absence of both cervix and uterus: Secondary | ICD-10-CM

## 2023-01-08 DIAGNOSIS — Z888 Allergy status to other drugs, medicaments and biological substances status: Secondary | ICD-10-CM

## 2023-01-08 DIAGNOSIS — E7800 Pure hypercholesterolemia, unspecified: Secondary | ICD-10-CM | POA: Diagnosis present

## 2023-01-08 DIAGNOSIS — I10 Essential (primary) hypertension: Secondary | ICD-10-CM | POA: Diagnosis not present

## 2023-01-08 DIAGNOSIS — R748 Abnormal levels of other serum enzymes: Secondary | ICD-10-CM

## 2023-01-08 DIAGNOSIS — E785 Hyperlipidemia, unspecified: Secondary | ICD-10-CM

## 2023-01-08 DIAGNOSIS — T796XXA Traumatic ischemia of muscle, initial encounter: Secondary | ICD-10-CM | POA: Diagnosis not present

## 2023-01-08 DIAGNOSIS — N179 Acute kidney failure, unspecified: Secondary | ICD-10-CM | POA: Diagnosis present

## 2023-01-08 DIAGNOSIS — E1122 Type 2 diabetes mellitus with diabetic chronic kidney disease: Secondary | ICD-10-CM | POA: Diagnosis present

## 2023-01-08 DIAGNOSIS — S43401A Unspecified sprain of right shoulder joint, initial encounter: Secondary | ICD-10-CM | POA: Diagnosis present

## 2023-01-08 DIAGNOSIS — M50322 Other cervical disc degeneration at C5-C6 level: Secondary | ICD-10-CM | POA: Diagnosis not present

## 2023-01-08 DIAGNOSIS — I5042 Chronic combined systolic (congestive) and diastolic (congestive) heart failure: Secondary | ICD-10-CM | POA: Diagnosis present

## 2023-01-08 DIAGNOSIS — N184 Chronic kidney disease, stage 4 (severe): Secondary | ICD-10-CM

## 2023-01-08 DIAGNOSIS — I252 Old myocardial infarction: Secondary | ICD-10-CM

## 2023-01-08 DIAGNOSIS — S4991XA Unspecified injury of right shoulder and upper arm, initial encounter: Secondary | ICD-10-CM | POA: Diagnosis not present

## 2023-01-08 DIAGNOSIS — Z87891 Personal history of nicotine dependence: Secondary | ICD-10-CM

## 2023-01-08 DIAGNOSIS — I255 Ischemic cardiomyopathy: Secondary | ICD-10-CM

## 2023-01-08 DIAGNOSIS — N170 Acute kidney failure with tubular necrosis: Secondary | ICD-10-CM | POA: Diagnosis present

## 2023-01-08 DIAGNOSIS — Y92009 Unspecified place in unspecified non-institutional (private) residence as the place of occurrence of the external cause: Secondary | ICD-10-CM

## 2023-01-08 DIAGNOSIS — E860 Dehydration: Secondary | ICD-10-CM | POA: Diagnosis present

## 2023-01-08 DIAGNOSIS — Z79899 Other long term (current) drug therapy: Secondary | ICD-10-CM

## 2023-01-08 DIAGNOSIS — I482 Chronic atrial fibrillation, unspecified: Secondary | ICD-10-CM | POA: Diagnosis present

## 2023-01-08 LAB — COMPREHENSIVE METABOLIC PANEL
ALT: 17 U/L (ref 0–44)
AST: 43 U/L — ABNORMAL HIGH (ref 15–41)
Albumin: 3.4 g/dL — ABNORMAL LOW (ref 3.5–5.0)
Alkaline Phosphatase: 28 U/L — ABNORMAL LOW (ref 38–126)
Anion gap: 11 (ref 5–15)
BUN: 49 mg/dL — ABNORMAL HIGH (ref 8–23)
CO2: 21 mmol/L — ABNORMAL LOW (ref 22–32)
Calcium: 8.7 mg/dL — ABNORMAL LOW (ref 8.9–10.3)
Chloride: 103 mmol/L (ref 98–111)
Creatinine, Ser: 2.8 mg/dL — ABNORMAL HIGH (ref 0.44–1.00)
GFR, Estimated: 16 mL/min — ABNORMAL LOW (ref >60)
Glucose, Bld: 149 mg/dL — ABNORMAL HIGH (ref 70–99)
Potassium: 5.5 mmol/L — ABNORMAL HIGH (ref 3.5–5.1)
Sodium: 135 mmol/L (ref 135–145)
Total Bilirubin: 0.7 mg/dL (ref 0.3–1.2)
Total Protein: 6.6 g/dL (ref 6.5–8.1)

## 2023-01-08 LAB — CBC
HCT: 37.8 % (ref 36.0–46.0)
Hemoglobin: 11.9 g/dL — ABNORMAL LOW (ref 12.0–15.0)
MCH: 31.9 pg (ref 26.0–34.0)
MCHC: 31.5 g/dL (ref 30.0–36.0)
MCV: 101.3 fL — ABNORMAL HIGH (ref 80.0–100.0)
Platelets: 151 10*3/uL (ref 150–400)
RBC: 3.73 MIL/uL — ABNORMAL LOW (ref 3.87–5.11)
RDW: 13.3 % (ref 11.5–15.5)
WBC: 15.7 10*3/uL — ABNORMAL HIGH (ref 4.0–10.5)
nRBC: 0 % (ref 0.0–0.2)

## 2023-01-08 LAB — TROPONIN I (HIGH SENSITIVITY)
Troponin I (High Sensitivity): 14 ng/L (ref <18)
Troponin I (High Sensitivity): 19 ng/L — ABNORMAL HIGH (ref <18)

## 2023-01-08 LAB — BASIC METABOLIC PANEL
Anion gap: 10 (ref 5–15)
BUN: 49 mg/dL — ABNORMAL HIGH (ref 8–23)
CO2: 20 mmol/L — ABNORMAL LOW (ref 22–32)
Calcium: 8.2 mg/dL — ABNORMAL LOW (ref 8.9–10.3)
Chloride: 105 mmol/L (ref 98–111)
Creatinine, Ser: 2.95 mg/dL — ABNORMAL HIGH (ref 0.44–1.00)
GFR, Estimated: 15 mL/min — ABNORMAL LOW (ref >60)
Glucose, Bld: 130 mg/dL — ABNORMAL HIGH (ref 70–99)
Potassium: 4.8 mmol/L (ref 3.5–5.1)
Sodium: 135 mmol/L (ref 135–145)

## 2023-01-08 LAB — MAGNESIUM: Magnesium: 2.4 mg/dL (ref 1.7–2.4)

## 2023-01-08 LAB — CK: Total CK: 2831 U/L — ABNORMAL HIGH (ref 38–234)

## 2023-01-08 MED ORDER — OXYCODONE-ACETAMINOPHEN 5-325 MG PO TABS
1.0000 | ORAL_TABLET | Freq: Four times a day (QID) | ORAL | Status: DC | PRN
  Administered 2023-01-08 – 2023-01-09 (×3): 1 via ORAL
  Filled 2023-01-08 (×3): qty 1

## 2023-01-08 MED ORDER — METHOCARBAMOL 1000 MG/10ML IJ SOLN
500.0000 mg | Freq: Three times a day (TID) | INTRAMUSCULAR | Status: DC | PRN
  Administered 2023-01-08: 500 mg via INTRAVENOUS
  Filled 2023-01-08: qty 10

## 2023-01-08 MED ORDER — SIMVASTATIN 20 MG PO TABS
20.0000 mg | ORAL_TABLET | Freq: Every day | ORAL | Status: DC
  Administered 2023-01-09: 20 mg via ORAL
  Filled 2023-01-08: qty 1

## 2023-01-08 MED ORDER — SODIUM ZIRCONIUM CYCLOSILICATE 10 G PO PACK
10.0000 g | PACK | Freq: Every day | ORAL | Status: AC
  Administered 2023-01-09: 10 g via ORAL
  Filled 2023-01-08: qty 1

## 2023-01-08 MED ORDER — SODIUM CHLORIDE 0.9 % IV BOLUS
500.0000 mL | Freq: Once | INTRAVENOUS | Status: AC
  Administered 2023-01-08: 500 mL via INTRAVENOUS

## 2023-01-08 MED ORDER — METOPROLOL SUCCINATE ER 50 MG PO TB24
50.0000 mg | ORAL_TABLET | Freq: Every day | ORAL | Status: DC
  Administered 2023-01-08: 50 mg via ORAL
  Filled 2023-01-08: qty 1

## 2023-01-08 MED ORDER — APIXABAN 2.5 MG PO TABS
2.5000 mg | ORAL_TABLET | Freq: Two times a day (BID) | ORAL | Status: DC
  Administered 2023-01-08 – 2023-01-10 (×4): 2.5 mg via ORAL
  Filled 2023-01-08 (×4): qty 1

## 2023-01-08 MED ORDER — ACETAMINOPHEN 650 MG RE SUPP: 650.0000 mg | Freq: Four times a day (QID) | RECTAL | Status: DC | PRN

## 2023-01-08 MED ORDER — SODIUM ZIRCONIUM CYCLOSILICATE 10 G PO PACK
10.0000 g | PACK | Freq: Once | ORAL | Status: AC
  Administered 2023-01-08: 10 g via ORAL
  Filled 2023-01-08: qty 1

## 2023-01-08 MED ORDER — DIGOXIN 0.0625 MG HALF TABLET
0.0625 mg | ORAL_TABLET | ORAL | Status: DC
  Administered 2023-01-09: 0.0625 mg via ORAL
  Filled 2023-01-08 (×2): qty 1

## 2023-01-08 MED ORDER — ONDANSETRON HCL 4 MG PO TABS: 4.0000 mg | ORAL_TABLET | Freq: Four times a day (QID) | ORAL | Status: DC | PRN

## 2023-01-08 MED ORDER — SODIUM CHLORIDE 0.9 % IV SOLN: INTRAVENOUS | Status: DC

## 2023-01-08 MED ORDER — ONDANSETRON HCL 4 MG/2ML IJ SOLN: 4.0000 mg | Freq: Four times a day (QID) | INTRAMUSCULAR | Status: DC | PRN

## 2023-01-08 MED ORDER — ACETAMINOPHEN 325 MG PO TABS: 650.0000 mg | ORAL_TABLET | Freq: Four times a day (QID) | ORAL | Status: DC | PRN

## 2023-01-08 MED ORDER — SENNOSIDES-DOCUSATE SODIUM 8.6-50 MG PO TABS: 1.0000 | ORAL_TABLET | Freq: Every evening | ORAL | Status: DC | PRN

## 2023-01-08 MED ORDER — OXYCODONE-ACETAMINOPHEN 5-325 MG PO TABS
1.0000 | ORAL_TABLET | Freq: Once | ORAL | Status: AC
  Administered 2023-01-08: 1 via ORAL
  Filled 2023-01-08: qty 1

## 2023-01-08 MED ORDER — SODIUM ZIRCONIUM CYCLOSILICATE 10 G PO PACK: 10.0000 g | PACK | Freq: Once | ORAL | Status: DC

## 2023-01-08 MED ORDER — PANTOPRAZOLE SODIUM 40 MG PO TBEC
40.0000 mg | DELAYED_RELEASE_TABLET | Freq: Every day | ORAL | Status: DC
  Administered 2023-01-08 – 2023-01-10 (×3): 40 mg via ORAL
  Filled 2023-01-08 (×3): qty 1

## 2023-01-08 MED ORDER — FENTANYL CITRATE PF 50 MCG/ML IJ SOSY
25.0000 ug | PREFILLED_SYRINGE | INTRAMUSCULAR | Status: DC | PRN
  Administered 2023-01-08: 25 ug via INTRAVENOUS
  Filled 2023-01-08: qty 1

## 2023-01-08 MED ORDER — DAPAGLIFLOZIN PROPANEDIOL 10 MG PO TABS: 10.0000 mg | ORAL_TABLET | Freq: Every day | ORAL | Status: DC

## 2023-01-08 MED ORDER — METHOCARBAMOL 500 MG PO TABS
500.0000 mg | ORAL_TABLET | Freq: Once | ORAL | Status: AC
  Administered 2023-01-08: 500 mg via ORAL
  Filled 2023-01-08: qty 1

## 2023-01-08 MED ORDER — PNEUMOCOCCAL 20-VAL CONJ VACC 0.5 ML IM SUSY
0.5000 mL | PREFILLED_SYRINGE | INTRAMUSCULAR | Status: AC
  Administered 2023-01-09: 0.5 mL via INTRAMUSCULAR
  Filled 2023-01-08: qty 0.5

## 2023-01-08 MED ORDER — NITROGLYCERIN 0.4 MG SL SUBL: 0.4000 mg | SUBLINGUAL_TABLET | SUBLINGUAL | Status: DC | PRN

## 2023-01-08 NOTE — H&P (Signed)
History and Physical    Patient: Terry Hanson MWU:[id number] DOB: February 27, 1941 DOA: 01/08/2023 DOS: the patient was seen and examined on 01/08/2023 PCP: Pcp, No  Patient coming from: Home  Chief Complaint:  Chief Complaint  Patient presents with   Fall   HPI: Terry Hanson is a 82 y.o. female with medical history significant of stage 3b CKD, DM, chronic HFref, asthma, hyperlipidemia,  Review of Systems: As mentioned in the history of present illness. All other systems reviewed and are negative. Past Medical History:  Diagnosis Date   Arthritis    Asthma    Chronic HFrEF (heart failure with reduced ejection fraction) (HCC)    Chronic kidney disease, stage 3b (HCC)    Diabetes mellitus (HCC)    Hypercholesterolemia    Hyperlipidemia    Ischemic cardiomyopathy    STEMI (ST elevation myocardial infarction) (HCC)    a. 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   Past Surgical History:  Procedure Laterality Date   ABDOMINAL HYSTERECTOMY     ATRIAL FIBRILLATION ABLATION N/A 04/05/2021   Procedure: ATRIAL FIBRILLATION ABLATION;  Surgeon: Regan Lemming, MD;  Location: [facility] IN[facility]ASIVE [facility];  Service: Cardiovascular;  Laterality: N/A;   CATARACT EXTRACTION W/PHACO Right 06/27/2020   Procedure: CATARACT EXTRACTION PHACO AND INTRAOCULAR LENS PLACEMENT RIGHT EYE AND PLACEMENT OF CORTICOSTEROID;  Surgeon: Fabio Pierce, MD;  Location: [facility];  Service: Ophthalmology;  Laterality: Right;  right CDE=25.47   CORONARY/GRAFT ACUTE MI REVASCULARIZATION N/A 06/29/2019   Procedure: Coronary/Graft Acute MI Revascularization;  Surgeon: Lyn Records, MD;  Location: [facility] INVASIVE CV LAB;  Service: Cardiovascular;  Laterality: N/A;   ICD IMPLANT N/A 12/14/2019   Procedure: ICD IMPLANT;  Surgeon: Marinus Maw, MD;  Location: [facility] INVASIVE CV LAB;  Service: Cardiovascular;  Laterality: N/A;   LEFT HEART CATH AND CORONARY ANGIOGRAPHY N/A 06/29/2019   Procedure: LEFT HEART CATH AND CORONARY ANGIOGRAPHY;   Surgeon: Lyn Records, MD;  Location: [facility];  Service: Cardiovascular;  Laterality: N/A;   RIGHT HEART CATH N/A 12/30/2020   Procedure: RIGHT HEART CATH;  Surgeon: Laurey Morale, MD;  Location: [facility] INVASIVE CV LAB;  Service: Cardiovascular;  Laterality: N/A;   TEE WITHOUT CARDIOVERSION N/A 04/04/2021   Procedure: TRANSESOPHAGEAL ECHOCARDIOGRAM (TEE);  Surgeon: Jake Bathe, MD;  Location: [facility] ENDOSCOPY;  Service: Cardiovascular;  Laterality: N/A;   Social History:  reports that she has never smoked. She has quit using smokeless tobacco.  Her smokeless tobacco use included chew. She reports that she does not currently use alcohol. She reports that she does not use drugs.  Allergies  Allergen Reactions   Iodinated Contrast Media Shortness Of Breath    Per Patient- The last time she had contrast during a scan she had difficulty breathing   Codeine Hives   Shrimp [Shellfish Allergy] Other (See Comments)    Broke out   Amiodarone Nausea And Vomiting   Penicillins Itching and Rash    Did it involve swelling of the face/tongue/throat, SOB, or low BP? Yes Did it involve sudden or severe rash/hives, skin peeling, or any reaction on the inside of your mouth or nose? Yes Did you need to seek medical attention at a hospital or doctor's office? Yes When did it last happen? Over 10 years       If all above answers are "NO", may proceed with cephalosporin use.    Sulfa Antibiotics Itching    Family History  Problem Relation Age  of Onset   Heart disease Mother     Prior to Admission medications   Medication Sig Start Date End Date Taking? Authorizing Provider  apixaban (ELIQUIS) 2.5 MG TABS tablet Take 1 tablet (2.5 mg total) by mouth 2 (two) times daily. 12/10/22   Azucena Fallen, MD  digoxin 62.5 MCG TABS Take 0.0625 mg by mouth 3 (three) times a week. Taking Monday, Wednesday, and Friday. 12/10/22   Azucena Fallen, MD  FARXIGA 10 MG TABS tablet TAKE ONE TABLET (10 MG  TOTAL) BY MOUTH DAILY AT 9 AM BEFORE BREAKFAST Patient taking differently: Take 10 mg by mouth daily. 08/30/22   Mallipeddi, Vishnu P, MD  furosemide (LASIX) 20 MG tablet Take 1 tablet (20 mg total) by mouth daily. 05/01/22   Laurey Morale, MD  metoprolol succinate (TOPROL-XL) 50 MG 24 hr tablet TAKE 1 TABLET(50 MG) BY MOUTH AT BEDTIME Patient taking differently: Take 50 mg by mouth at bedtime. 05/01/22   Laurey Morale, MD  nitroGLYCERIN (NITROSTAT) 0.4 MG SL tablet Place 1 tablet (0.4 mg total) under the tongue every 5 (five) minutes as needed. 07/02/19   Arty Baumgartner, NP  nystatin (MYCOSTATIN/NYSTOP) powder Apply topically 3 (three) times daily. 12/09/22   Azucena Fallen, MD  pantoprazole (PROTONIX) 40 MG tablet Take 1 tablet (40 mg total) by mouth daily. 12/09/22 12/09/23  Azucena Fallen, MD  sacubitril-valsartan (ENTRESTO) 24-26 MG Take 1 tablet by mouth 2 (two) times daily. 05/30/22   Mallipeddi, Vishnu P, MD  simvastatin (ZOCOR) 20 MG tablet TAKE 1 TABLET(20 MG) BY MOUTH EVERY EVENING 12/20/22   Laurey Morale, MD  spironolactone (ALDACTONE) 25 MG tablet Take 1 tablet (25 mg total) by mouth at bedtime. 05/01/22   Laurey Morale, MD    Physical Exam: Vitals:   01/08/23 0845 01/08/23 0847 01/08/23 1300 01/08/23 1433  BP: (!) 105/31  (!) 101/44 (!) 105/45  Pulse: (!) 50  (!) 51 (!) 54  Resp:  18 20 18   Temp:  98.2 F (36.8 C)  98.7 F (37.1 C)  TempSrc:  Oral  Oral  SpO2: 97%  98% 100%  Weight:      Height:       General exam: Appears calm and comfortable  Respiratory system: Clear to auscultation. Respiratory effort normal. Cardiovascular system: S1 & S2 heard, RRR. No JVD,  Gastrointestinal system: Abdomen is nondistended, soft and nontender.  Central nervous system: Alert and oriented. No focal neurological deficits. Extremities: right chest wall tenderness, right shoulder tenderness.  Skin: No rashes, lesions or ulcers Psychiatry: Mood & affect appropriate.    Data Reviewed: Results for orders placed or performed during the hospital encounter of 01/08/23 (from the past 24 hour(s))  Comprehensive metabolic panel     Status: Abnormal   Collection Time: 01/08/23 11:04 AM  Result Value Ref Range   Sodium 135 135 - 145 mmol/L   Potassium 5.5 (H) 3.5 - 5.1 mmol/L   Chloride 103 98 - 111 mmol/L   CO2 21 (L) 22 - 32 mmol/L   Glucose, Bld 149 (H) 70 - 99 mg/dL   BUN 49 (H) 8 - 23 mg/dL   Creatinine, Ser 6.60 (H) 0.44 - 1.00 mg/dL   Calcium 8.7 (L) 8.9 - 10.3 mg/dL   Total Protein 6.6 6.5 - 8.1 g/dL   Albumin 3.4 (L) 3.5 - 5.0 g/dL   AST 43 (H) 15 - 41 U/L   ALT 17 0 - 44 U/L  Alkaline Phosphatase 28 (L) 38 - 126 U/L   Total Bilirubin 0.7 0.3 - 1.2 mg/dL   GFR, Estimated 16 (L) >60 mL/min   Anion gap 11 5 - 15  Magnesium     Status: None   Collection Time: 01/08/23 11:04 AM  Result Value Ref Range   Magnesium 2.4 1.7 - 2.4 mg/dL  Troponin I (High Sensitivity)     Status: None   Collection Time: 01/08/23 11:04 AM  Result Value Ref Range   Troponin I (High Sensitivity) 14 <18 ng/L  CBC     Status: Abnormal   Collection Time: 01/08/23 11:04 AM  Result Value Ref Range   WBC 15.7 (H) 4.0 - 10.5 K/uL   RBC 3.73 (L) 3.87 - 5.11 MIL/uL   Hemoglobin 11.9 (L) 12.0 - 15.0 g/dL   HCT 16.1 09.6 - 04.5 %   MCV 101.3 (H) 80.0 - 100.0 fL   MCH 31.9 26.0 - 34.0 pg   MCHC 31.5 30.0 - 36.0 g/dL   RDW 40.9 81.1 - 91.4 %   Platelets 151 150 - 400 K/uL   nRBC 0.0 0.0 - 0.2 %  CK     Status: Abnormal   Collection Time: 01/08/23 11:45 AM  Result Value Ref Range   Total CK 2,831 (H) 38 - 234 U/L  Troponin I (High Sensitivity)     Status: Abnormal   Collection Time: 01/08/23  1:26 PM  Result Value Ref Range   Troponin I (High Sensitivity) 19 (H) <18 ng/L     Assessment and Plan:  Mechanical fall:  On the right side. With pain and tenderness on the right shoulder and right sided chest wall.  Pain control.  Therapy evaluations ordered.    Mild  rhabdomyolysis:  Traumatic , pain control.  Gentle hydration.  Follow CK levels.    Hyperkalemia:  K of 5.5 continue with lokelma.  Check bmp later tonight.    Mild AKI on stage 3b CKD:  ? Dehydration ,  Baseline creatinine between 1.3 to 2.  Creatinine of 2.8 on admission.    HFrEF; She appears compensated.  Hold lasix, entresto and spironolactone for mild AKI and hyperkalemia.  Resume digoxin and metoprolol   Hyperlipidemia Resume zocor.    Leukocytosis Unclear etiology.  Check UA,  CT chest abd and pelvis showing Healing anterior rib fractures, right ribs 5 through 7 and left ribs 5 and 6. Periosteal new bone formation bilaterally.   Chronic atrial fibrillation:  Rate controlled.  On Eliquis for anticoagulation.       Advance Care Planning: full code.   Consults: none.   Family Communication: family at bedside.   Severity of Illness: The appropriate patient status for this patient is OBSERVATION. Observation status is judged to be reasonable and necessary in order to provide the required intensity of service to ensure the patient's safety. The patient's presenting symptoms, physical exam findings, and initial radiographic and laboratory data in the context of their medical condition is felt to place them at decreased risk for further clinical deterioration. Furthermore, it is anticipated that the patient will be medically stable for discharge from the hospital within 2 midnights of admission.   Author: Kathlen Mody, MD 01/08/2023 5:43 PM  For on call review [email or web address].

## 2023-01-08 NOTE — ED Provider Notes (Signed)
[place] EMERGENCY DEPARTMENT AT [facility] Provider Note   CSN: [id number] Arrival date & time: 01/08/23  3086     History  Chief Complaint  Patient presents with   Fall    Terry Hanson is a 82 y.o. female.  HPI Patient presents after a fall.  This morning, she woke to her car alarm going off.  When she looked outside, the headlights were still flashing.  She went outside to turn off the headlights.  She was walking back inside her home, she lost her balance and stumbled.  As she was falling, she reached out with her right hand to grab the door handle.  She fell and this caused a pull on her right shoulder.  She has since had severe pain in area of her right shoulder.  She denies any other areas of discomfort.  She denies striking her head.  She denies loss of consciousness.  Per chart review, medical history includes CAD, atrial fibrillation, CHF, asthma, DM, CKD, HLD, anemia, defibrillator placement, arthritis.  She is prescribed Eliquis.    Home Medications Prior to Admission medications   Medication Sig Start Date End Date Taking? Authorizing Provider  apixaban (ELIQUIS) 2.5 MG TABS tablet Take 1 tablet (2.5 mg total) by mouth 2 (two) times daily. 12/10/22   Azucena Fallen, MD  digoxin 62.5 MCG TABS Take 0.0625 mg by mouth 3 (three) times a week. Taking Monday, Wednesday, and Friday. 12/10/22   Azucena Fallen, MD  FARXIGA 10 MG TABS tablet TAKE ONE TABLET (10 MG TOTAL) BY MOUTH DAILY AT 9 AM BEFORE BREAKFAST Patient taking differently: Take 10 mg by mouth daily. 08/30/22   Mallipeddi, Vishnu P, MD  furosemide (LASIX) 20 MG tablet Take 1 tablet (20 mg total) by mouth daily. 05/01/22   Laurey Morale, MD  metoprolol succinate (TOPROL-XL) 50 MG 24 hr tablet TAKE 1 TABLET(50 MG) BY MOUTH AT BEDTIME Patient taking differently: Take 50 mg by mouth at bedtime. 05/01/22   Laurey Morale, MD  nitroGLYCERIN (NITROSTAT) 0.4 MG SL tablet Place 1 tablet (0.4 mg total)  under the tongue every 5 (five) minutes as needed. 07/02/19   Arty Baumgartner, NP  nystatin (MYCOSTATIN/NYSTOP) powder Apply topically 3 (three) times daily. 12/09/22   Azucena Fallen, MD  pantoprazole (PROTONIX) 40 MG tablet Take 1 tablet (40 mg total) by mouth daily. 12/09/22 12/09/23  Azucena Fallen, MD  sacubitril-valsartan (ENTRESTO) 24-26 MG Take 1 tablet by mouth 2 (two) times daily. 05/30/22   Mallipeddi, Vishnu P, MD  simvastatin (ZOCOR) 20 MG tablet TAKE 1 TABLET(20 MG) BY MOUTH EVERY EVENING 12/20/22   Laurey Morale, MD  spironolactone (ALDACTONE) 25 MG tablet Take 1 tablet (25 mg total) by mouth at bedtime. 05/01/22   Laurey Morale, MD      Allergies    Iodinated contrast media, Codeine, Shrimp [shellfish allergy], Amiodarone, Penicillins, and Sulfa antibiotics    Review of Systems   Review of Systems  Musculoskeletal:  Positive for arthralgias.  All other systems reviewed and are negative.   Physical Exam Updated Vital Signs BP (!) 105/45   Pulse (!) 54   Temp 98.7 F (37.1 C) (Oral)   Resp 18   Ht 5\' 2"  (1.575 m)   Wt 88.9 kg   SpO2 100%   BMI 35.85 kg/m  Physical Exam Vitals and nursing note reviewed.  Constitutional:      General: She is not in acute distress.  Appearance: Normal appearance. She is well-developed. She is not ill-appearing, toxic-appearing or diaphoretic.  HENT:     Head: Normocephalic and atraumatic.     Right Ear: External ear normal.     Left Ear: External ear normal.     Nose: Nose normal.     Mouth/Throat:     Mouth: Mucous membranes are moist.  Eyes:     Extraocular Movements: Extraocular movements intact.     Conjunctiva/sclera: Conjunctivae normal.  Cardiovascular:     Rate and Rhythm: Normal rate and regular rhythm.  Pulmonary:     Effort: Pulmonary effort is normal. No respiratory distress.  Abdominal:     General: There is no distension.     Palpations: Abdomen is soft.  Musculoskeletal:        General:  Tenderness and signs of injury present. No swelling.     Cervical back: Normal range of motion and neck supple.  Skin:    General: Skin is warm and dry.     Coloration: Skin is not jaundiced or pale.  Neurological:     General: No focal deficit present.     Mental Status: She is alert and oriented to person, place, and time.  Psychiatric:        Mood and Affect: Mood normal.        Behavior: Behavior normal.     ED Results / Procedures / Treatments   Labs (all labs ordered are listed, but only abnormal results are displayed) Labs Reviewed  COMPREHENSIVE METABOLIC PANEL - Abnormal; Notable for the following components:      Result Value   Potassium 5.5 (*)    CO2 21 (*)    Glucose, Bld 149 (*)    BUN 49 (*)    Creatinine, Ser 2.80 (*)    Calcium 8.7 (*)    Albumin 3.4 (*)    AST 43 (*)    Alkaline Phosphatase 28 (*)    GFR, Estimated 16 (*)    All other components within normal limits  CBC - Abnormal; Notable for the following components:   WBC 15.7 (*)    RBC 3.73 (*)    Hemoglobin 11.9 (*)    MCV 101.3 (*)    All other components within normal limits  CK - Abnormal; Notable for the following components:   Total CK 2,831 (*)    All other components within normal limits  TROPONIN I (HIGH SENSITIVITY) - Abnormal; Notable for the following components:   Troponin I (High Sensitivity) 19 (*)    All other components within normal limits  MAGNESIUM  TROPONIN I (HIGH SENSITIVITY)    EKG EKG Interpretation Date/Time:  Tuesday January 08 2023 09:03:14 EDT Ventricular Rate:  51 PR Interval:  151 QRS Duration:  93 QT Interval:  414 QTC Calculation: 382 R Axis:   105  Text Interpretation: Sinus rhythm Right axis deviation Low voltage, precordial leads Nonspecific T abnormalities, lateral leads Confirmed by Gloris Manchester ([id number]) on 01/08/2023 9:06:17 AM  Radiology CT CHEST ABDOMEN PELVIS WO CONTRAST  Result Date: 01/08/2023 CLINICAL DATA:  82 year old female status post  fall on sidewalk. Landed on right side. EXAM: CT CHEST, ABDOMEN AND PELVIS WITHOUT CONTRAST TECHNIQUE: Multidetector CT imaging of the chest, abdomen and pelvis was performed following the standard protocol without IV contrast. RADIATION DOSE REDUCTION: This exam was performed according to the departmental dose-optimization program which includes automated exposure control, adjustment of the mA and/or kV according to patient size and/or use of iterative reconstruction  technique. COMPARISON:  Noncontrast CT Abdomen and Pelvis 12/01/2022. FINDINGS: CT CHEST FINDINGS Cardiovascular: Left chest AICD. Calcified coronary artery atherosclerosis and/or stent. Calcified aortic atherosclerosis. Vascular patency is not evaluated in the absence of IV contrast. Mild cardiomegaly. No pericardial effusion. Mediastinum/Nodes: No mediastinal hematoma, mass, lymphadenopathy identified in the absence of contrast. Small chronic gastric hiatal hernia. Lungs/Pleura: Major airways are patent. Normal lung volumes. Both lungs are essentially clear. No pneumothorax, pleural effusion, pulmonary contusion. Musculoskeletal: Healing anterior rib fractures, right ribs 5 through 7 and left ribs 5 and 6. Periosteal new bone formation bilaterally. Visible shoulder osseous structures appear intact, aligned. Underlying osteopenia. No acute sternal fracture. No acute osseous abnormality identified in the thoracic spine. CT ABDOMEN PELVIS FINDINGS Hepatobiliary: Noncontrast liver and gallbladder appear stable. Dependent sludge or stones better demonstrated in September. No perihepatic fluid. Pancreas: Stable. Spleen: Stable.  No perisplenic fluid. Adrenals/Urinary Tract: Stable. Some chronic renal cortical atrophy. No hydronephrosis or urinary calculus. Stomach/Bowel: Chronic sigmoid diverticulosis. No dilated or inflamed bowel is evident on this noncontrast exam. Diminutive and normal appendix on coronal image 78. Small chronic gastric hiatal hernia.  Otherwise negative stomach and duodenum. No free air or free fluid. Vascular/Lymphatic: Extensive Aortoiliac calcified atherosclerosis. Normal caliber abdominal aorta. Vascular patency is not evaluated in the absence of IV contrast. No lymphadenopathy. Reproductive: Surgically absent. Other: No pelvis free fluid.  Chronic pelvic phleboliths. Musculoskeletal: Lower lumbar vacuum disc and facet arthropathy. Mild grade 1 anterolisthesis at L4-L5. Underlying osteopenia. Chronic and degenerative vacuum phenomena associated with the anterior right SI joint. No acute osseous abnormality identified. No acute superficial soft tissue injury identified. IMPRESSION: 1. Healing bilateral anterior rib fractures. 2. No acute traumatic injury identified in the noncontrast chest, abdomen, or pelvis. 3. Chronic findings including gallbladder sludge or stones, sigmoid diverticulosis, calcified coronary artery and Aortic Atherosclerosis (ICD10-I70.0). Electronically Signed   By: Odessa Fleming M.D.   On: 01/08/2023 10:55   CT CERVICAL SPINE WO CONTRAST  Result Date: 01/08/2023 CLINICAL DATA:  82 year old female status post fall on sidewalk. Landed on right side. EXAM: CT CERVICAL SPINE WITHOUT CONTRAST TECHNIQUE: Multidetector CT imaging of the cervical spine was performed without intravenous contrast. Multiplanar CT image reconstructions were also generated. RADIATION DOSE REDUCTION: This exam was performed according to the departmental dose-optimization program which includes automated exposure control, adjustment of the mA and/or kV according to patient size and/or use of iterative reconstruction technique. COMPARISON:  Head CT today.  Cervical spine CT 12/01/2022. FINDINGS: Alignment: Stable straightening of cervical lordosis. Cervicothoracic junction alignment is within normal limits. Bilateral posterior element alignment is within normal limits. Skull base and vertebrae: Visualized skull base is intact. No atlanto-occipital  dissociation. Congenital incomplete ossification of the posterior C1 ring, normal variant. C1 and C2 appear intact and aligned. No acute osseous abnormality identified. Soft tissues and spinal canal: No prevertebral fluid or swelling. No visible canal hematoma. Partially retropharyngeal course of both cervical carotids which are heavily calcified. Disc levels: Stable cervical disc and endplate degeneration maximal at C5-C6. Upper chest: Lung apices are clear. Partially visible left thoracic inlet transvenous AICD lead. Chest CT reported separately. IMPRESSION: 1. No acute traumatic injury identified in the cervical spine. 2. Stable cervical spine degeneration. Calcified and retropharyngeal cervical carotids. Electronically Signed   By: Odessa Fleming M.D.   On: 01/08/2023 10:47   DG Chest 2 View  Result Date: 01/08/2023 CLINICAL DATA:  82 year old female status post fall on sidewalk. Landed on right side. EXAM: CHEST - 2 VIEW COMPARISON:  Right shoulder series today. Portable chest 12/01/2022 and earlier. FINDINGS: Upright AP and lateral views of the chest at 0811 hours. Left chest AICD is stable. Mild cardiomegaly. Other mediastinal contours are within normal limits. Visualized tracheal air column is within normal limits. No pneumothorax, pulmonary edema, pleural effusion or confluent lung opacity. 1 of the arms is not raised on the lateral view limiting its utility. No acute osseous abnormality identified. Negative visible bowel gas. IMPRESSION: 1. No acute cardiopulmonary abnormality or acute traumatic injury identified. 2. Mild cardiomegaly.  Chronic left chest AICD. Electronically Signed   By: Odessa Fleming M.D.   On: 01/08/2023 10:44   DG Shoulder Right  Result Date: 01/08/2023 CLINICAL DATA:  82 year old female status post fall on sidewalk. Landed on right side. EXAM: RIGHT SHOULDER - 2+ VIEW COMPARISON:  Portable chest 12/01/2022. FINDINGS: Three views of the right shoulder at 0805 hours. No glenohumeral joint  dislocation. Proximal right humerus appears intact. Visible right scapula and clavicle appear intact. Negative visible right ribs and chest. IMPRESSION: No acute fracture or dislocation identified about the right shoulder. Electronically Signed   By: Odessa Fleming M.D.   On: 01/08/2023 10:42   CT HEAD WO CONTRAST  Result Date: 01/08/2023 CLINICAL DATA:  82 year old female status post fall on sidewalk. Landed on right side. EXAM: CT HEAD WITHOUT CONTRAST TECHNIQUE: Contiguous axial images were obtained from the base of the skull through the vertex without intravenous contrast. RADIATION DOSE REDUCTION: This exam was performed according to the departmental dose-optimization program which includes automated exposure control, adjustment of the mA and/or kV according to patient size and/or use of iterative reconstruction technique. COMPARISON:  Head CT 12/01/2022. 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 Stable cerebral volume. Stable and elsewhere normal for age gray-white differentiation. No midline shift, ventriculomegaly, mass effect, evidence of mass lesion, intracranial hemorrhage or evidence of cortically based acute infarction. Vascular: Extensive Calcified atherosclerosis at the skull base. No suspicious intracranial vascular hyperdensity. Skull: Stable.  No acute osseous abnormality identified. Sinuses/Orbits: Visualized paranasal sinuses and mastoids are stable and well aerated. Other: No orbit or scalp soft tissue injury identified. IMPRESSION: 1. No acute intracranial abnormality or acute traumatic injury identified. 2. Chronic right cerebellar infarct. Electronically Signed   By: Odessa Fleming M.D.   On: 01/08/2023 10:39    Procedures Procedures    Medications Ordered in ED Medications  sodium chloride 0.9 % bolus 500 mL (has no administration in time range)  0.9 %  sodium chloride infusion (has no administration in time range)  fentaNYL (SUBLIMAZE) injection 25 mcg  (has no administration in time range)  pneumococcal 20-valent conjugate vaccine (PREVNAR 20) injection 0.5 mL (has no administration in time range)  sodium zirconium cyclosilicate (LOKELMA) packet 10 g (has no administration in time range)  oxyCODONE-acetaminophen (PERCOCET/ROXICET) 5-325 MG per tablet 1 tablet (1 tablet Oral Given 01/08/23 0845)  methocarbamol (ROBAXIN) tablet 500 mg (500 mg Oral Given 01/08/23 1122)    ED Course/ Medical Decision Making/ A&P                                 Medical Decision Making Amount and/or Complexity of Data Reviewed Labs: ordered. Radiology: ordered. ECG/medicine tests: ordered.  Risk Prescription drug management. Decision regarding hospitalization.   This patient presents to the ED for concern of fall, this involves an extensive number of treatment options, and is a complaint that carries with it  a high risk of complications and morbidity.  The differential diagnosis includes acute injuries   Co morbidities that complicate the patient evaluation  CAD, atrial fibrillation, CHF, asthma, DM, CKD, HLD, anemia, defibrillator placement, arthritis   Additional history obtained:  Additional history obtained from N/A External records from outside source obtained and reviewed including EMR   Lab Tests:  I Ordered, and personally interpreted labs.  The pertinent results include: Creatinine increased at baseline, mild hypokalemia, elevated CK, leukocytosis is present.   Imaging Studies ordered:  I ordered imaging studies including x-ray of chest and right shoulder; CT of head, cervical spine, chest, abdomen, pelvis I independently visualized and interpreted imaging which showed no acute findings I agree with the radiologist interpretation   Cardiac Monitoring: / EKG:  The patient was maintained on a cardiac monitor.  I personally viewed and interpreted the cardiac monitored which showed an underlying rhythm of: Sinus rhythm   Problem  List / ED Course / Critical interventions / Medication management  Patient presenting after mechanical fall.  On arrival, endorsing severe right shoulder pain.  Percocet was ordered for analgesia.  Imaging studies were ordered.  Right shoulder x-ray did not show any acute findings..  On reassessment, patient has ongoing, although improved pain.  Robaxin was ordered for ongoing analgesia.  Right arm sling was ordered.  Patient underwent CT imaging in addition to lab work.  CT imaging also did not show any acute injuries.  Lab work was notable for mild increase in creatinine from baseline, mild hyperkalemia, and elevated CK.  IV fluids were ordered.  Patient was admitted for further management. I ordered medication including Percocet and Robaxin for analgesia; IV fluids for hydration Reevaluation of the patient after these medicines showed that the patient improved I have reviewed the patients home medicines and have made adjustments as needed   Social Determinants of Health:  Has access to outpatient care         Final Clinical Impression(s) / ED Diagnoses Final diagnoses:  Fall, initial encounter  Hyperkalemia  Elevated CK    Rx / DC Orders ED Discharge Orders     None         Gloris Manchester, MD 01/08/23 1531

## 2023-01-08 NOTE — ED Triage Notes (Signed)
Pt BIB [facility] from home for fall; pt states her car alarm was going off and she went outside to cancel the alarm when her foot got caught on sidewalk and she fell, landing on her right side  Pt denies any loc and states she waited outside for 30 mins til help arrived

## 2023-01-08 NOTE — ED Notes (Signed)
Difficulties obtaining blood samples for labwork.

## 2023-01-09 DIAGNOSIS — Y92009 Unspecified place in unspecified non-institutional (private) residence as the place of occurrence of the external cause: Secondary | ICD-10-CM | POA: Diagnosis not present

## 2023-01-09 DIAGNOSIS — E7800 Pure hypercholesterolemia, unspecified: Secondary | ICD-10-CM | POA: Diagnosis not present

## 2023-01-09 DIAGNOSIS — E860 Dehydration: Secondary | ICD-10-CM | POA: Diagnosis not present

## 2023-01-09 DIAGNOSIS — N1832 Chronic kidney disease, stage 3b: Secondary | ICD-10-CM

## 2023-01-09 DIAGNOSIS — W19XXXD Unspecified fall, subsequent encounter: Secondary | ICD-10-CM | POA: Diagnosis not present

## 2023-01-09 DIAGNOSIS — I2510 Atherosclerotic heart disease of native coronary artery without angina pectoris: Secondary | ICD-10-CM | POA: Diagnosis not present

## 2023-01-09 DIAGNOSIS — Z961 Presence of intraocular lens: Secondary | ICD-10-CM | POA: Diagnosis not present

## 2023-01-09 DIAGNOSIS — Z90710 Acquired absence of both cervix and uterus: Secondary | ICD-10-CM | POA: Diagnosis not present

## 2023-01-09 DIAGNOSIS — N170 Acute kidney failure with tubular necrosis: Secondary | ICD-10-CM | POA: Diagnosis present

## 2023-01-09 DIAGNOSIS — W19XXXA Unspecified fall, initial encounter: Secondary | ICD-10-CM | POA: Diagnosis present

## 2023-01-09 DIAGNOSIS — I255 Ischemic cardiomyopathy: Secondary | ICD-10-CM | POA: Diagnosis not present

## 2023-01-09 DIAGNOSIS — T796XXA Traumatic ischemia of muscle, initial encounter: Secondary | ICD-10-CM | POA: Diagnosis not present

## 2023-01-09 DIAGNOSIS — Z95810 Presence of automatic (implantable) cardiac defibrillator: Secondary | ICD-10-CM | POA: Diagnosis not present

## 2023-01-09 DIAGNOSIS — Z91041 Radiographic dye allergy status: Secondary | ICD-10-CM | POA: Diagnosis not present

## 2023-01-09 DIAGNOSIS — Z79899 Other long term (current) drug therapy: Secondary | ICD-10-CM | POA: Diagnosis not present

## 2023-01-09 DIAGNOSIS — T796XXD Traumatic ischemia of muscle, subsequent encounter: Secondary | ICD-10-CM | POA: Diagnosis not present

## 2023-01-09 DIAGNOSIS — E669 Obesity, unspecified: Secondary | ICD-10-CM | POA: Diagnosis not present

## 2023-01-09 DIAGNOSIS — N184 Chronic kidney disease, stage 4 (severe): Secondary | ICD-10-CM | POA: Diagnosis not present

## 2023-01-09 DIAGNOSIS — D72829 Elevated white blood cell count, unspecified: Secondary | ICD-10-CM | POA: Diagnosis not present

## 2023-01-09 DIAGNOSIS — Z7901 Long term (current) use of anticoagulants: Secondary | ICD-10-CM | POA: Diagnosis not present

## 2023-01-09 DIAGNOSIS — I482 Chronic atrial fibrillation, unspecified: Secondary | ICD-10-CM

## 2023-01-09 DIAGNOSIS — I5022 Chronic systolic (congestive) heart failure: Secondary | ICD-10-CM | POA: Diagnosis not present

## 2023-01-09 DIAGNOSIS — Z8249 Family history of ischemic heart disease and other diseases of the circulatory system: Secondary | ICD-10-CM | POA: Diagnosis not present

## 2023-01-09 DIAGNOSIS — J45909 Unspecified asthma, uncomplicated: Secondary | ICD-10-CM | POA: Diagnosis not present

## 2023-01-09 DIAGNOSIS — E875 Hyperkalemia: Secondary | ICD-10-CM | POA: Diagnosis not present

## 2023-01-09 DIAGNOSIS — S43401A Unspecified sprain of right shoulder joint, initial encounter: Secondary | ICD-10-CM | POA: Diagnosis not present

## 2023-01-09 DIAGNOSIS — I517 Cardiomegaly: Secondary | ICD-10-CM | POA: Diagnosis not present

## 2023-01-09 DIAGNOSIS — Z23 Encounter for immunization: Secondary | ICD-10-CM | POA: Diagnosis not present

## 2023-01-09 DIAGNOSIS — E1122 Type 2 diabetes mellitus with diabetic chronic kidney disease: Secondary | ICD-10-CM | POA: Diagnosis not present

## 2023-01-09 DIAGNOSIS — I252 Old myocardial infarction: Secondary | ICD-10-CM | POA: Diagnosis not present

## 2023-01-09 DIAGNOSIS — N179 Acute kidney failure, unspecified: Secondary | ICD-10-CM | POA: Diagnosis not present

## 2023-01-09 DIAGNOSIS — I509 Heart failure, unspecified: Secondary | ICD-10-CM | POA: Diagnosis not present

## 2023-01-09 DIAGNOSIS — R748 Abnormal levels of other serum enzymes: Secondary | ICD-10-CM | POA: Diagnosis not present

## 2023-01-09 LAB — CBC WITH DIFFERENTIAL/PLATELET
Abs Immature Granulocytes: 0.05 10*3/uL (ref 0.00–0.07)
Basophils Absolute: 0 10*3/uL (ref 0.0–0.1)
Basophils Relative: 1 %
Eosinophils Absolute: 0.3 10*3/uL (ref 0.0–0.5)
Eosinophils Relative: 5 %
HCT: 30.4 % — ABNORMAL LOW (ref 36.0–46.0)
Hemoglobin: 9.6 g/dL — ABNORMAL LOW (ref 12.0–15.0)
Immature Granulocytes: 1 %
Lymphocytes Relative: 26 %
Lymphs Abs: 2 10*3/uL (ref 0.7–4.0)
MCH: 31.7 pg (ref 26.0–34.0)
MCHC: 31.6 g/dL (ref 30.0–36.0)
MCV: 100.3 fL — ABNORMAL HIGH (ref 80.0–100.0)
Monocytes Absolute: 0.6 10*3/uL (ref 0.1–1.0)
Monocytes Relative: 8 %
Neutro Abs: 4.6 10*3/uL (ref 1.7–7.7)
Neutrophils Relative %: 59 %
Platelets: 130 10*3/uL — ABNORMAL LOW (ref 150–400)
RBC: 3.03 MIL/uL — ABNORMAL LOW (ref 3.87–5.11)
RDW: 13.4 % (ref 11.5–15.5)
WBC: 7.6 10*3/uL (ref 4.0–10.5)
nRBC: 0 % (ref 0.0–0.2)

## 2023-01-09 LAB — BASIC METABOLIC PANEL
Anion gap: 8 (ref 5–15)
BUN: 57 mg/dL — ABNORMAL HIGH (ref 8–23)
CO2: 21 mmol/L — ABNORMAL LOW (ref 22–32)
Calcium: 7.9 mg/dL — ABNORMAL LOW (ref 8.9–10.3)
Chloride: 104 mmol/L (ref 98–111)
Creatinine, Ser: 3.08 mg/dL — ABNORMAL HIGH (ref 0.44–1.00)
GFR, Estimated: 15 mL/min — ABNORMAL LOW (ref >60)
Glucose, Bld: 131 mg/dL — ABNORMAL HIGH (ref 70–99)
Potassium: 4.8 mmol/L (ref 3.5–5.1)
Sodium: 133 mmol/L — ABNORMAL LOW (ref 135–145)

## 2023-01-09 LAB — PROCALCITONIN: Procalcitonin: 0.12 ng/mL

## 2023-01-09 LAB — CK: Total CK: 2809 U/L — ABNORMAL HIGH (ref 38–234)

## 2023-01-09 MED ORDER — FUROSEMIDE 20 MG PO TABS
20.0000 mg | ORAL_TABLET | Freq: Every day | ORAL | Status: DC
  Administered 2023-01-10: 20 mg via ORAL
  Filled 2023-01-09: qty 1

## 2023-01-09 MED ORDER — NYSTATIN 100000 UNIT/GM EX POWD
Freq: Three times a day (TID) | CUTANEOUS | Status: DC
  Filled 2023-01-09: qty 15

## 2023-01-09 MED ORDER — SPIRONOLACTONE 25 MG PO TABS
25.0000 mg | ORAL_TABLET | Freq: Every day | ORAL | Status: DC
  Administered 2023-01-09: 25 mg via ORAL
  Filled 2023-01-09: qty 1

## 2023-01-09 MED ORDER — SODIUM CHLORIDE 0.9 % IV SOLN: INTRAVENOUS | Status: AC

## 2023-01-09 NOTE — Hospital Course (Signed)
82 year old female with atrial fibrillation, coronary artery disease, asthma, heart failure, diabetes mellitus, chronic kidney disease hyperlipidemia, status post defibrillator placement and arthritis presented to the emergency department after a fall at home.  She says that she was down on the floor for about 45 minutes before someone was able to come and get her.  She was admitted to the hospital with mild case of rhabdomyolysis and acute kidney injury.

## 2023-01-09 NOTE — Discharge Instructions (Signed)
Providers Accepting New Patients in [place], [place]    [facility] [address]  [place], [place] [address] [phone] Accepts most insurances  [facility] [address] [place], [place] [address] [phone] Accepts most insurances  [facility] [address] [place], [place] [address]  [phone] Must meet requirements  [facility] [address] [place], [place] [address] [phone] Accepts most insurances  [facility] [address]  [place], [place] [address] [phone] Accepts most insurances  [facility] [address]   [place], [place]   [phone] Accepts most insurances  [facility])  [phone] [address]  [place], [place] [address] [phone] Accepts most insurances  [facility] [address]  [place], [place] [address] [phone] Accepts most insurances  [facility] [address] [place], [place] [address] [phone] option 1 Accepts Medicaid and [facility] [address]  [place], [place] [address] [phone] Accepts most insurances  Terry Gully, Terry Hanson [address] [place], [place] [address] [phone] Accepts most insurances  [facility] at [facility] [address]  [place], [place] [address] [phone] Accepts most insurances  [facility] [phone] [place], [place] [address] [phone] Accepts most insurances  [place] [address] [place], [place] [address] [phone]  Accepts most insurances      IMPORTANT INFORMATION: PAY CLOSE ATTENTION   PHYSICIAN DISCHARGE INSTRUCTIONS  Follow with Primary care provider  Pcp, No  and other consultants as instructed by your Hospitalist Physician  SEEK MEDICAL CARE OR RETURN TO EMERGENCY ROOM IF SYMPTOMS COME BACK,  WORSEN OR NEW PROBLEM DEVELOPS   Please note: You were cared for by a hospitalist during your hospital stay. Every effort will be made to forward records to your primary care provider.  You can request that your primary care provider send for your hospital records if they have not received them.  Once you are discharged, your primary care physician will handle any further medical issues. Please note that NO REFILLS for any discharge medications will be authorized once you are discharged, as it is imperative that you return to your primary care physician (or establish a relationship with a primary care physician if you do not have one) for your post hospital discharge needs so that they can reassess your need for medications and monitor your lab values.  Please get a complete blood count and chemistry panel checked by your Primary Terry Hanson at your next visit, and again as instructed by your Primary Terry Hanson.  Get Medicines reviewed and adjusted: Please take all your medications with you for your next visit with your Primary Terry Hanson  Laboratory/radiological data: Please request your Primary Terry Hanson to go over all hospital tests and procedure/radiological results at the follow up, please ask your primary care provider to get all Hospital records sent to his/her office.  In some cases, they will be blood work, cultures and biopsy results pending at the time of your discharge. Please request that your primary care provider follow up on these results.  If you are diabetic, please bring your blood sugar readings with you to your follow up appointment with primary care.    Please call and make your follow up appointments as soon as possible.    Also Note the following: If you experience worsening of your admission symptoms, develop  shortness of breath, life threatening emergency, suicidal or homicidal thoughts you must seek medical attention immediately by calling 911 or calling your Terry Hanson immediately  if symptoms less  severe.  You must read complete instructions/literature along with all the possible adverse reactions/side effects for all the Medicines you take and that have been prescribed to you. Take any new Medicines after you have completely understood and accpet all the possible adverse reactions/side effects.   Do not drive when taking Pain medications or sleeping medications (Benzodiazepines)  Do not take more than prescribed Pain, Sleep and Anxiety Medications. It is not advisable to combine anxiety,sleep and pain medications without talking with your primary care practitioner  Special Instructions: If you have smoked or chewed Tobacco  in the last 2 yrs please stop smoking, stop any regular Alcohol  and or any Recreational drug use.  Wear Seat belts while driving.  Do not drive if taking any narcotic, mind altering or controlled substances or recreational drugs or alcohol.               Providers Accepting New Patients in [place], [place]    [facility] [address]  [place], [place] [address] [phone] Accepts most insurances  [facility] [address] [place], [place] [address] [phone] Accepts most insurances  [facility] [address] [place], [place] [address]  [phone] Must meet requirements  [facility] [address] [place], [place] [address] [phone] Accepts most insurances  [facility] [address]  [place], [place] [address] [phone] Accepts most insurances  [facility] [address]   [place], [place]   [phone] Accepts most insurances  [facility])  [phone] [address]  [place], [place] [address] [phone] Accepts most insurances     [facility] [address]  [place], [place] [address] [phone] Accepts most insurances  [facility] [address] [place], [place] [address] [phone] option 1 Accepts Medicaid and [facility] [address]  [place], [place] [address] [phone] Accepts most insurances  Terry Gully, Terry Hanson [address] [place], [place] [address] [phone] Accepts most insurances  [facility] at [facility] [address]  [place], [place] [address] [phone] Accepts most insurances  [facility] [phone] [address] [place], [place] [address] [phone] Accepts most insurances  [place], [place] [address] [place], [place] [address] [phone]  Accepts most insurances

## 2023-01-09 NOTE — Evaluation (Signed)
Occupational Therapy Evaluation Patient Details Name: Terry Hanson MRN: [id number] DOB: 03/04/1941 Today's Date: 01/09/2023   History of Present Illness Terry Hanson is a 82 y.o. female with medical history significant of stage 3b CKD, DM, chronic HFref, asthma, hyperlipidemia, (per MD)   Clinical Impression   Pt agreeable to OT and PT co-evaluation. Pt independent at baseline. Today primary deficit appears to be R UE limited range of motion and strength since pt's fall to R side. Pt was still able to dress and ambulate without significant assist, but range is notably limited compared to pt's report of baseline. Outpatient versus home health recommended to increase range and strength to R UE. Pt left in bed with call bell within reach. Pt is not recommended for further acute OT services and will be discharged to care of nursing staff for remaining length of stay.          If plan is discharge home, recommend the following: Assist for transportation    Functional Status Assessment  Patient has had a recent decline in their functional status and demonstrates the ability to make significant improvements in function in a reasonable and predictable amount of time.  Equipment Recommendations  None recommended by OT           Precautions / Restrictions Precautions Precautions: Fall Restrictions Weight Bearing Restrictions: No      Mobility Bed Mobility Overal bed mobility: Needs Assistance Bed Mobility: Supine to Sit     Supine to sit: Min assist     General bed mobility comments: Single hand held assist. Pt reports sleeping in a chair at home.    Transfers Overall transfer level: Needs assistance   Transfers: Sit to/from Stand, Bed to chair/wheelchair/BSC Sit to Stand: Modified independent (Device/Increase time), Supervision     Step pivot transfers: Modified independent (Device/Increase time), Supervision     General transfer comment: Mildly unsteady/ labored  movement.      Balance Overall balance assessment: Mild deficits observed, not formally tested                                         ADL either performed or assessed with clinical judgement   ADL Overall ADL's : Modified independent;Independent                                       General ADL Comments: R UE range of motion deficts but pt still able to complete upper and lower body dressing and ambulate.     Vision Baseline Vision/History: 1 Wears glasses Ability to See in Adequate Light: 1 Impaired Patient Visual Report: No change from baseline Vision Assessment?: No apparent visual deficits     Perception Perception: Not tested       Praxis Praxis: Not tested       Pertinent Vitals/Pain Pain Assessment Pain Assessment: Faces Faces Pain Scale: Hurts little more Pain Location: Low back and R flank/shoulder Pain Descriptors / Indicators: Grimacing, Guarding Pain Intervention(s): Limited activity within patient's tolerance, Monitored during session, Repositioned     Extremity/Trunk Assessment Upper Extremity Assessment Upper Extremity Assessment: Right hand dominant;RUE deficits/detail;Generalized weakness RUE Deficits / Details: 2+/5 for shoulder flexion and abduction. Generally weak otherwise. Pt fell on R side.   Lower Extremity Assessment Lower Extremity Assessment: Defer to PT evaluation  Cervical / Trunk Assessment Cervical / Trunk Assessment: Normal   Communication Communication Communication: No apparent difficulties   Cognition Arousal: Alert Behavior During Therapy: WFL for tasks assessed/performed Overall Cognitive Status: Within Functional Limits for tasks assessed                                                        Home Living Family/patient expects to be discharged to:: Private residence Living Arrangements: Children (daughter) Available Help at Discharge: Family;Available  PRN/intermittently Type of Home: Mobile home Home Access: Stairs to enter Entrance Stairs-Number of Steps: 1 to 2 Entrance Stairs-Rails: None Home Layout: One level     Bathroom Shower/Tub: Chief Strategy Officer: Handicapped height Bathroom Accessibility: Yes   Home Equipment: Teacher, English as a foreign language (2 wheels);Cane - single point;Tub bench;Rollator (4 wheels)          Prior Functioning/Environment Prior Level of Function : Independent/Modified Independent;History of Falls (last six months)             Mobility Comments: Pt reports independent mobility without AD use. drives ADLs Comments: Independent                                Co-evaluation PT/OT/SLP Co-Evaluation/Treatment: Yes Reason for Co-Treatment: To address functional/ADL transfers   OT goals addressed during session: ADL's and self-care      AM-PAC OT "6 Clicks" Daily Activity     Outcome Measure Help from another person eating meals?: None Help from another person taking care of personal grooming?: None Help from another person toileting, which includes using toliet, bedpan, or urinal?: None Help from another person bathing (including washing, rinsing, drying)?: None Help from another person to put on and taking off regular upper body clothing?: None Help from another person to put on and taking off regular lower body clothing?: None 6 Click Score: 24   End of Session Equipment Utilized During Treatment: Gait belt  Activity Tolerance: Patient tolerated treatment well Patient left: in bed;with call bell/phone within reach  OT Visit Diagnosis: History of falling (Z91.81);Unsteadiness on feet (R26.81)                Time: 4540-9811 OT Time Calculation (min): 18 min Charges:  OT General Charges $OT Visit: 1 Visit OT Evaluation $OT Eval Low Complexity: 1 Low  Mckenize Mezera OT, MOT  Danie Chandler 01/09/2023, 9:29 AM

## 2023-01-09 NOTE — TOC Initial Note (Signed)
Transition of Care [facility]) - Initial/Assessment Note    Patient Details  Name: Terry Hanson MRN: [id number] Date of Birth: November 25, 1940  [facility] of [facility]) CM/SW Contact:    Leitha Bleak, RN Phone Number: 01/09/2023, 1:45 PM  Clinical Narrative:     Patient admitted after a fall. Patient has a high risk for readmission. Patient lives with family and has DME to used as needed. PT is recommending HHPT. Patient is agreeable.  CMS provider options reviewed, patient is agreeable to any agency that will accept her insurance. Morrie Sheldon with [facility] accepted the referral, MD aware to order.            Expected Discharge Plan: Home w Home Health Services Barriers to Discharge: Continued Medical Work up   Patient Goals and CMS Choice Patient states their goals for this hospitalization and ongoing recovery are:: want HHPT CMS Medicare.gov Compare Post Acute Care list provided to:: Patient Choice offered to / list presented to : Patient     Expected Discharge Plan and Services     Post Acute Care Choice: Home Health Living arrangements for the past 2 months: Single Family Home       HH Arranged: PT HH Agency: [facility]) Date HH Agency Contacted: 01/09/23 Time HH Agency Contacted: 1245 Representative spoke with at [facility] Agency: Morrie Sheldon  Prior Living Arrangements/Services Living arrangements for the past 2 months: Single Family Home Lives with:: Adult Children Patient language and need for interpreter reviewed:: Yes Do you feel safe going back to the place where you live?: Yes      Need for Family Participation in Patient Care: Yes (Comment) Care giver support system in place?: Yes (comment) Current home services: DME Criminal Activity/Legal Involvement Pertinent to Current Situation/Hospitalization: No - Comment as needed  Activities of Daily Living   ADL Screening (condition at time of admission) Independently performs ADLs?: Yes (appropriate for  developmental age) Is the patient deaf or have difficulty hearing?: No Does the patient have difficulty seeing, even when wearing glasses/contacts?: No Does the patient have difficulty concentrating, remembering, or making decisions?: No  Permission Sought/Granted       Emotional Assessment     Affect (typically observed): Accepting Orientation: : Oriented to Self, Oriented to Place, Oriented to Situation Alcohol / Substance Use: Not Applicable Psych Involvement: No (comment)  Admission diagnosis:  Fall [W19.XXXA] Rhabdomyolysis [M62.82] Patient Active Problem List   Diagnosis Date Noted   Fall 01/08/2023   AKI (acute kidney injury) (HCC) 01/08/2023   Hyperkalemia 01/08/2023   Rhabdomyolysis 01/08/2023   Ventricular fibrillation (HCC) 12/05/2022   Anemia 12/04/2022   Cardiac syncope 12/01/2022   Fall at home, initial encounter 12/01/2022   Hypomagnesemia 12/01/2022   Hypokalemia 12/01/2022   Hypocalcemia 12/01/2022   Symptomatic anemia 12/01/2022   Thrombocytopenia (HCC) 12/01/2022   Macrocytic anemia 12/01/2022   Persistent atrial fibrillation (HCC) 12/01/2022   Obesity (BMI 30-39.9) 12/01/2022   ERRONEOUS ENCOUNTER--DISREGARD 08/02/2022   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   Acute respiratory failure with hypercapnia (HCC) 03/20/2020   Atrial fibrillation, chronic (HCC) 03/20/2020   COVID-19 virus infection 03/19/2020   Atrial fibrillation with RVR (HCC) 07/27/2019   CAD (coronary artery disease) 07/27/2019   Stage 3 chronic kidney disease (HCC) 07/27/2019   Nausea and vomiting 07/27/2019   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   Chronic systolic CHF (congestive heart failure) (HCC) 07/08/2019   Ischemic cardiomyopathy 07/08/2019   Hypercholesterolemia    STEMI (ST elevation myocardial infarction) (HCC)  Asthma    Acute ST elevation myocardial infarction (STEMI) due to occlusion of left anterior descending (LAD)  coronary artery (HCC) 06/29/2019   Acute systolic heart failure (HCC) 06/29/2019   DM II (diabetes mellitus, type II), controlled (HCC) 06/29/2019   Acute ST elevation myocardial infarction (STEMI) involving left anterior descending (LAD) coronary artery (HCC) 06/29/2019   Acute ST elevation myocardial infarction (STEMI) [facility])    PCP:  Pcp, No Pharmacy:   [facility] [place] - [place], [place] - [address] [place] [address] Phone: [phone] Fax: [phone]  [id number] - [place], [place] - [address] AT [facility] [address] [place] [address] Phone: [phone] Fax: [phone]     Social Determinants of Health (SDOH) Social History: SDOH Screenings   Food Insecurity: No Food Insecurity (01/08/2023)  Housing: Low Risk  (01/08/2023)  Transportation Needs: No Transportation Needs (01/08/2023)  Utilities: Not At Risk (01/08/2023)  Tobacco Use: Medium Risk (01/08/2023)   SDOH Interventions:   Readmission Risk Interventions    01/09/2023    1:27 PM  Readmission Risk Prevention Plan  Transportation Screening Complete  PCP or Specialist Appt within 5-7 Days Not Complete  Home Care Screening Complete  Medication Review (RN CM) Complete

## 2023-01-09 NOTE — Plan of Care (Signed)
  Problem: Education: Goal: Knowledge of General Education information will improve Description: Including pain rating scale, medication(s)/side effects and non-pharmacologic comfort measures Outcome: Progressing   Problem: Health Behavior/Discharge Planning: Goal: Ability to manage health-related needs will improve Outcome: Progressing   Problem: Activity: Goal: Risk for activity intolerance will decrease Outcome: Progressing   

## 2023-01-09 NOTE — Progress Notes (Signed)
   01/09/23 1130  Spiritual Encounters  Type of Visit Initial  Care provided to: Pt and family  Referral source Other (comment) (Spiritual Consult)  Reason for visit Advance directives  OnCall Visit No   Chaplain responded to a spiritual consult for advanced directive education. The patient Terry Hanson, was in good spirits and listened alternatively as I went over the documents.Beyonka told me she did not like to answer a lot of questions so I focused my explanation on the two documents. I did encourage her to think about the questions if she had time, Basya expressed herself as not very reflective. Her visitor concurred.   Valerie Roys [facility]  [phone]

## 2023-01-09 NOTE — Evaluation (Signed)
Physical Therapy Evaluation Patient Details Name: Terry Hanson MRN: [id number] DOB: 07-Apr-1940 Today's Date: 01/09/2023  History of Present Illness  Terry Hanson is a 82 y.o. female with medical history significant of stage 3b CKD, DM, chronic HFref, asthma, hyperlipidemia,   Clinical Impression  Patient has most difficulty for sitting up at bedside due to right shoulder pain/discomfort, demonstrates good return for transfers and walking short distances without need for an AD with no loss of balance.  Patient encouraged to ambulate with nursing staff, mobility tech as tolerated for length of stay.  Plan:  Patient discharged from physical therapy to care of nursing for ambulation daily as tolerated for length of stay.         If plan is discharge home, recommend the following: A little help with walking and/or transfers;A little help with bathing/dressing/bathroom;Help with stairs or ramp for entrance;Assistance with cooking/housework   Can travel by private vehicle        Equipment Recommendations None recommended by PT  Recommendations for Other Services       Functional Status Assessment Patient has had a recent decline in their functional status and demonstrates the ability to make significant improvements in function in a reasonable and predictable amount of time.     Precautions / Restrictions Precautions Precautions: Fall Restrictions Weight Bearing Restrictions: No      Mobility  Bed Mobility Overal bed mobility: Needs Assistance Bed Mobility: Supine to Sit     Supine to sit: Min assist     General bed mobility comments: Single hand held assist. Pt reports sleeping in a chair at home.    Transfers Overall transfer level: Needs assistance Equipment used: None Transfers: Sit to/from Stand, Bed to chair/wheelchair/BSC Sit to Stand: Modified independent (Device/Increase time), Supervision   Step pivot transfers: Modified independent (Device/Increase time),  Supervision       General transfer comment: Mildly unsteady/ labored movement.    Ambulation/Gait Ambulation/Gait assistance: Supervision Gait Distance (Feet): 65 Feet Assistive device: None Gait Pattern/deviations: Decreased step length - right, Decreased step length - left, Decreased stride length Gait velocity: decreased     General Gait Details: slightly labored cadence without loss of balance or need for an AD,  limited mostly due to c/o fatigue  Stairs            Wheelchair Mobility     Tilt Bed    Modified Rankin (Stroke Patients Only)       Balance Overall balance assessment: Mild deficits observed, not formally tested                                           Pertinent Vitals/Pain Pain Assessment Pain Assessment: Faces Faces Pain Scale: Hurts little more Pain Location: Low back and R flank/shoulder Pain Descriptors / Indicators: Grimacing, Guarding Pain Intervention(s): Limited activity within patient's tolerance, Monitored during session, Repositioned    Home Living Family/patient expects to be discharged to:: Private residence Living Arrangements: Children Available Help at Discharge: Family;Available PRN/intermittently Type of Home: Mobile home Home Access: Stairs to enter Entrance Stairs-Rails: None Entrance Stairs-Number of Steps: 1 to 2   Home Layout: One level Home Equipment: Teacher, English as a foreign language (2 wheels);Cane - single point;Tub bench;Rollator (4 wheels)      Prior Function Prior Level of Function : Independent/Modified Independent;History of Falls (last six months)  Mobility Comments: Pt reports independent mobility without AD use. drives ADLs Comments: Independent     Extremity/Trunk Assessment   Upper Extremity Assessment Upper Extremity Assessment: Defer to OT evaluation RUE Deficits / Details: 2+/5 for shoulder flexion and abduction. Generally weak otherwise. Pt fell on R side.     Lower Extremity Assessment Lower Extremity Assessment: Generalized weakness    Cervical / Trunk Assessment Cervical / Trunk Assessment: Normal  Communication   Communication Communication: No apparent difficulties  Cognition Arousal: Alert Behavior During Therapy: WFL for tasks assessed/performed Overall Cognitive Status: Within Functional Limits for tasks assessed                                          General Comments      Exercises     Assessment/Plan    PT Assessment All further PT needs can be met in the next venue of care  PT Problem List Decreased strength;Decreased activity tolerance;Decreased balance;Decreased mobility       PT Treatment Interventions      PT Goals (Current goals can be found in the Care Plan section)  Acute Rehab PT Goals Patient Stated Goal: return home with family to assist PT Goal Formulation: With patient Time For Goal Achievement: 01/09/23 Potential to Achieve Goals: Good    Frequency       Co-evaluation PT/OT/SLP Co-Evaluation/Treatment: Yes Reason for Co-Treatment: To address functional/ADL transfers PT goals addressed during session: Mobility/safety with mobility;Balance OT goals addressed during session: ADL's and self-care       AM-PAC PT "6 Clicks" Mobility  Outcome Measure Help needed turning from your back to your side while in a flat bed without using bedrails?: A Little Help needed moving from lying on your back to sitting on the side of a flat bed without using bedrails?: A Little Help needed moving to and from a bed to a chair (including a wheelchair)?: None Help needed standing up from a chair using your arms (e.g., wheelchair or bedside chair)?: None Help needed to walk in hospital room?: A Little Help needed climbing 3-5 steps with a railing? : A Little 6 Click Score: 20    End of Session   Activity Tolerance: Patient tolerated treatment well;Patient limited by fatigue Patient left: in  bed;with call bell/phone within reach Nurse Communication: Mobility status PT Visit Diagnosis: Unsteadiness on feet (R26.81);Other abnormalities of gait and mobility (R26.89);Muscle weakness (generalized) (M62.81)    Time: 0981-1914 PT Time Calculation (min) (ACUTE ONLY): 10 min   Charges:   PT Evaluation $PT Eval Low Complexity: 1 Low PT Treatments $Therapeutic Activity: 8-22 mins PT General Charges $$ ACUTE PT VISIT: 1 Visit         11:53 AM, 01/09/23 Ocie Bob, MPT Physical Therapist with [facility] [phone] office [phone] mobile phone

## 2023-01-09 NOTE — Progress Notes (Signed)
PROGRESS NOTE   Terry Hanson  WJX:[id number] DOB: 1940-12-19 DOA: 01/08/2023 PCP: Pcp, No   Chief Complaint  Patient presents with   Fall   Level of care: Med-Surg  Brief Admission History:  82 year old female with atrial fibrillation, coronary artery disease, asthma, heart failure, diabetes mellitus, chronic kidney disease hyperlipidemia, status post defibrillator placement and arthritis presented to the emergency department after a fall at home.  She says that she was down on the floor for about 45 minutes before someone was able to come and get her.  She was admitted to the hospital with mild case of rhabdomyolysis and acute kidney injury.   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    Objective: Vitals:   01/08/23 2336 01/09/23 0418 01/09/23 0748 01/09/23 1135  BP: (!) 108/55 (!) 108/58 (!) 107/46 108/80  Pulse: 64 62 (!) 57 (!) 51  Resp: 18 18 20 18   Temp: 98.6 F (37 C) 98.2 F (36.8 C) 98.7 F (37.1 C) 98 F (36.7 C)  TempSrc: Axillary Oral Oral  Oral  SpO2: 95% 96% 95% 99%  Weight:      Height:        Intake/Output Summary (Last 24 hours) at 01/09/2023 1720 Last data filed at 01/09/2023 1308 Gross per 24 hour  Intake 3190.52 ml  Output --  Net 3190.52 ml   Filed Weights   01/08/23 0801  Weight: 88.9 kg   Examination:  General exam: chronically ill appearing female, awake, alert, cooperative; Appears calm and comfortable NAD.  Respiratory system: Clear to auscultation. No rales, no crackles. Respiratory effort normal. Cardiovascular system: normal S1 & S2 heard. No JVD, murmurs, rubs, gallops or clicks. No pedal edema. Gastrointestinal system: Abdomen is obese, nondistended, soft and nontender. No organomegaly or masses felt. Normal bowel sounds heard. Central nervous system: Alert and oriented. No focal neurological deficits. Extremities: Symmetric 5 x 5 power. Skin: No rashes, lesions or ulcers. Psychiatry: Judgement and insight appear normal. Mood & affect appropriate.   Data Reviewed: I have personally reviewed following labs and imaging studies  CBC: Recent Labs  Lab 01/08/23 1104 01/09/23 0516  WBC 15.7* 7.6  NEUTROABS  --  4.6  HGB 11.9* 9.6*  HCT 37.8 30.4*  MCV 101.3* 100.3*  PLT 151 130*    Basic Metabolic Panel: Recent Labs  Lab 01/08/23 1104 01/08/23 2007 01/09/23 0516  NA 135 135 133*  K 5.5* 4.8 4.8  CL 103 105 104  CO2 21* 20* 21*  GLUCOSE 149* 130* 131*  BUN 49*  49* 57*  CREATININE 2.80* 2.95* 3.08*  CALCIUM 8.7* 8.2* 7.9*  MG 2.4  --   --     CBG: No results for input(s): "GLUCAP" in the last 168 hours.  No results found for this or any previous visit (from the past 240 hour(s)).   Radiology Studies: CT CHEST ABDOMEN PELVIS WO CONTRAST  Result Date: 01/08/2023 CLINICAL DATA:  82 year old female status post fall on sidewalk. Landed on right side. EXAM: CT CHEST, ABDOMEN AND PELVIS WITHOUT CONTRAST TECHNIQUE: Multidetector CT imaging of the chest, abdomen and pelvis was  performed following the standard protocol without IV contrast. RADIATION DOSE REDUCTION: This exam was performed according to the departmental dose-optimization program which includes automated exposure control, adjustment of the mA and/or kV according to patient size and/or use of iterative reconstruction technique. COMPARISON:  Noncontrast CT Abdomen and Pelvis 12/01/2022. FINDINGS: CT CHEST FINDINGS Cardiovascular: Left chest AICD. Calcified coronary artery atherosclerosis and/or stent. Calcified aortic atherosclerosis. Vascular patency is not evaluated in the absence of IV contrast. Mild cardiomegaly. No pericardial effusion. Mediastinum/Nodes: No mediastinal hematoma, mass, lymphadenopathy identified in the absence of contrast. Small chronic gastric hiatal hernia. Lungs/Pleura: Major airways are patent. Normal lung volumes. Both lungs are essentially clear. No pneumothorax, pleural effusion, pulmonary contusion. Musculoskeletal: Healing anterior rib fractures, right ribs 5 through 7 and left ribs 5 and 6. Periosteal new bone formation bilaterally. Visible shoulder osseous structures appear intact, aligned. Underlying osteopenia. No acute sternal fracture. No acute osseous abnormality identified in the thoracic spine. CT ABDOMEN PELVIS FINDINGS Hepatobiliary: Noncontrast liver and gallbladder appear stable. Dependent sludge or stones better demonstrated in September. No perihepatic fluid. Pancreas: Stable. Spleen: Stable.  No perisplenic fluid. Adrenals/Urinary Tract: Stable. Some chronic renal cortical atrophy. No hydronephrosis or urinary calculus. Stomach/Bowel: Chronic sigmoid diverticulosis. No dilated or inflamed bowel is evident on this noncontrast exam. Diminutive and normal appendix on coronal image 78. Small chronic gastric hiatal hernia. Otherwise negative stomach and duodenum. No free air or free fluid. Vascular/Lymphatic: Extensive Aortoiliac calcified atherosclerosis. Normal caliber abdominal  aorta. Vascular patency is not evaluated in the absence of IV contrast. No lymphadenopathy. Reproductive: Surgically absent. Other: No pelvis free fluid.  Chronic pelvic phleboliths. Musculoskeletal: Lower lumbar vacuum disc and facet arthropathy. Mild grade 1 anterolisthesis at L4-L5. Underlying osteopenia. Chronic and degenerative vacuum phenomena associated with the anterior right SI joint. No acute osseous abnormality identified. No acute superficial soft tissue injury identified. IMPRESSION: 1. Healing bilateral anterior rib fractures. 2. No acute traumatic injury identified in the noncontrast chest, abdomen, or pelvis. 3. Chronic findings including gallbladder sludge or stones, sigmoid diverticulosis, calcified coronary artery and Aortic Atherosclerosis (ICD10-I70.0). Electronically Signed   By: Odessa Fleming M.D.   On: 01/08/2023 10:55   CT CERVICAL SPINE WO CONTRAST  Result Date: 01/08/2023 CLINICAL DATA:  82 year old female status post fall on sidewalk. Landed on right side. EXAM: CT CERVICAL SPINE WITHOUT CONTRAST TECHNIQUE: Multidetector CT imaging of the cervical spine was performed without intravenous contrast. Multiplanar CT image reconstructions were also generated. RADIATION DOSE REDUCTION: This exam was performed according to the departmental dose-optimization program which includes automated exposure control, adjustment of the mA and/or kV according to patient size and/or use of iterative reconstruction technique. COMPARISON:  Head CT today.  Cervical spine CT 12/01/2022. FINDINGS: Alignment: Stable straightening of cervical lordosis. Cervicothoracic junction alignment is within normal limits. Bilateral posterior element alignment is within normal limits. Skull base and vertebrae: Visualized skull base is intact. No atlanto-occipital dissociation. Congenital incomplete ossification  of the posterior C1 ring, normal variant. C1 and C2 appear intact and aligned. No acute osseous abnormality identified.  Soft tissues and spinal canal: No prevertebral fluid or swelling. No visible canal hematoma. Partially retropharyngeal course of both cervical carotids which are heavily calcified. Disc levels: Stable cervical disc and endplate degeneration maximal at C5-C6. Upper chest: Lung apices are clear. Partially visible left thoracic inlet transvenous AICD lead. Chest CT reported separately. IMPRESSION: 1. No acute traumatic injury identified in the cervical spine. 2. Stable cervical spine degeneration. Calcified and retropharyngeal cervical carotids. Electronically Signed   By: Odessa Fleming M.D.   On: 01/08/2023 10:47   DG Chest 2 View  Result Date: 01/08/2023 CLINICAL DATA:  82 year old female status post fall on sidewalk. Landed on right side. EXAM: CHEST - 2 VIEW COMPARISON:  Right shoulder series today. Portable chest 12/01/2022 and earlier. FINDINGS: Upright AP and lateral views of the chest at 0811 hours. Left chest AICD is stable. Mild cardiomegaly. Other mediastinal contours are within normal limits. Visualized tracheal air column is within normal limits. No pneumothorax, pulmonary edema, pleural effusion or confluent lung opacity. 1 of the arms is not raised on the lateral view limiting its utility. No acute osseous abnormality identified. Negative visible bowel gas. IMPRESSION: 1. No acute cardiopulmonary abnormality or acute traumatic injury identified. 2. Mild cardiomegaly.  Chronic left chest AICD. Electronically Signed   By: Odessa Fleming M.D.   On: 01/08/2023 10:44   DG Shoulder Right  Result Date: 01/08/2023 CLINICAL DATA:  82 year old female status post fall on sidewalk. Landed on right side. EXAM: RIGHT SHOULDER - 2+ VIEW COMPARISON:  Portable chest 12/01/2022. FINDINGS: Three views of the right shoulder at 0805 hours. No glenohumeral joint dislocation. Proximal right humerus appears intact. Visible right scapula and clavicle appear intact. Negative visible right ribs and chest. IMPRESSION: No acute  fracture or dislocation identified about the right shoulder. Electronically Signed   By: Odessa Fleming M.D.   On: 01/08/2023 10:42   CT HEAD WO CONTRAST  Result Date: 01/08/2023 CLINICAL DATA:  82 year old female status post fall on sidewalk. Landed on right side. EXAM: CT HEAD WITHOUT CONTRAST TECHNIQUE: Contiguous axial images were obtained from the base of the skull through the vertex without intravenous contrast. RADIATION DOSE REDUCTION: This exam was performed according to the departmental dose-optimization program which includes automated exposure control, adjustment of the mA and/or kV according to patient size and/or use of iterative reconstruction technique. COMPARISON:  Head CT 12/01/2022. 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 Stable cerebral volume. Stable and elsewhere normal for age gray-white differentiation. No midline shift, ventriculomegaly, mass effect, evidence of mass lesion, intracranial hemorrhage or evidence of cortically based acute infarction. Vascular: Extensive Calcified atherosclerosis at the skull base. No suspicious intracranial vascular hyperdensity. Skull: Stable.  No acute osseous abnormality identified. Sinuses/Orbits: Visualized paranasal sinuses and mastoids are stable and well aerated. Other: No orbit or scalp soft tissue injury identified. IMPRESSION: 1. No acute intracranial abnormality or acute traumatic injury identified. 2. Chronic right cerebellar infarct. Electronically Signed   By: Odessa Fleming M.D.   On: 01/08/2023 10:39    Scheduled Meds:  apixaban  2.5 mg Oral BID   digoxin  0.0625 mg Oral Once per day on Monday Wednesday Friday   furosemide  20 mg Oral Daily   metoprolol succinate  50 mg Oral QHS   nystatin   Topical TID   pantoprazole  40 mg Oral Daily   simvastatin  20  mg Oral q1800   spironolactone  25 mg Oral QHS   Continuous Infusions:  sodium chloride 100 mL/hr at 01/09/23 1307     LOS: 0 days   Time spent:  55 mins  Jaleil Renwick Laural Benes, MD How to contact the [facility] Attending or Consulting provider 7A - 7P or covering provider during after hours 7P -7A, for this patient?  Check the care team in [facility] and look for a) attending/consulting TRH provider listed and b) the [facility] team listed Log into [email or web address] to find provider on call.  Locate the [facility] provider you are looking for under [facility] and page to a number that you can be directly reached. If you still have difficulty reaching the provider, please page the [facility] (Director on Call) for the Hospitalists listed on amion for assistance.  01/09/2023, 5:20 PM

## 2023-01-10 ENCOUNTER — Inpatient Hospital Stay (HOSPITAL_COMMUNITY): Payer: No Typology Code available for payment source

## 2023-01-10 DIAGNOSIS — W19XXXD Unspecified fall, subsequent encounter: Secondary | ICD-10-CM | POA: Diagnosis not present

## 2023-01-10 DIAGNOSIS — I5022 Chronic systolic (congestive) heart failure: Secondary | ICD-10-CM | POA: Diagnosis not present

## 2023-01-10 DIAGNOSIS — T796XXD Traumatic ischemia of muscle, subsequent encounter: Secondary | ICD-10-CM

## 2023-01-10 DIAGNOSIS — E7800 Pure hypercholesterolemia, unspecified: Secondary | ICD-10-CM

## 2023-01-10 DIAGNOSIS — E875 Hyperkalemia: Secondary | ICD-10-CM | POA: Diagnosis not present

## 2023-01-10 LAB — CBC WITH DIFFERENTIAL/PLATELET
Abs Immature Granulocytes: 0.07 10*3/uL (ref 0.00–0.07)
Basophils Absolute: 0.1 10*3/uL (ref 0.0–0.1)
Basophils Relative: 1 %
Eosinophils Absolute: 0.5 10*3/uL (ref 0.0–0.5)
Eosinophils Relative: 7 %
HCT: 31.4 % — ABNORMAL LOW (ref 36.0–46.0)
Hemoglobin: 10.2 g/dL — ABNORMAL LOW (ref 12.0–15.0)
Immature Granulocytes: 1 %
Lymphocytes Relative: 24 %
Lymphs Abs: 1.7 10*3/uL (ref 0.7–4.0)
MCH: 33 pg (ref 26.0–34.0)
MCHC: 32.5 g/dL (ref 30.0–36.0)
MCV: 101.6 fL — ABNORMAL HIGH (ref 80.0–100.0)
Monocytes Absolute: 0.6 10*3/uL (ref 0.1–1.0)
Monocytes Relative: 8 %
Neutro Abs: 4.4 10*3/uL (ref 1.7–7.7)
Neutrophils Relative %: 59 %
Platelets: 140 10*3/uL — ABNORMAL LOW (ref 150–400)
RBC: 3.09 MIL/uL — ABNORMAL LOW (ref 3.87–5.11)
RDW: 13.8 % (ref 11.5–15.5)
WBC: 7.3 10*3/uL (ref 4.0–10.5)
nRBC: 0 % (ref 0.0–0.2)

## 2023-01-10 LAB — BASIC METABOLIC PANEL
Anion gap: 10 (ref 5–15)
BUN: 55 mg/dL — ABNORMAL HIGH (ref 8–23)
CO2: 20 mmol/L — ABNORMAL LOW (ref 22–32)
Calcium: 8.2 mg/dL — ABNORMAL LOW (ref 8.9–10.3)
Chloride: 104 mmol/L (ref 98–111)
Creatinine, Ser: 3.07 mg/dL — ABNORMAL HIGH (ref 0.44–1.00)
GFR, Estimated: 15 mL/min — ABNORMAL LOW (ref >60)
Glucose, Bld: 118 mg/dL — ABNORMAL HIGH (ref 70–99)
Potassium: 4.7 mmol/L (ref 3.5–5.1)
Sodium: 134 mmol/L — ABNORMAL LOW (ref 135–145)

## 2023-01-10 LAB — MAGNESIUM: Magnesium: 2.3 mg/dL (ref 1.7–2.4)

## 2023-01-10 LAB — CK: Total CK: 2308 U/L — ABNORMAL HIGH (ref 38–234)

## 2023-01-10 MED ORDER — ACETAMINOPHEN 325 MG PO TABS: 650.0000 mg | ORAL_TABLET | Freq: Three times a day (TID) | ORAL | 0 refills | Status: AC

## 2023-01-10 MED ORDER — OXYCODONE HCL 5 MG PO CAPS: 5.0000 mg | ORAL_CAPSULE | Freq: Four times a day (QID) | ORAL | 0 refills | Status: DC | PRN

## 2023-01-10 MED ORDER — LIDOCAINE 5 % EX PTCH: 1.0000 | MEDICATED_PATCH | Freq: Every day | CUTANEOUS | 0 refills | Status: DC | PRN

## 2023-01-10 MED ORDER — SIMVASTATIN 20 MG PO TABS: ORAL_TABLET | ORAL | Status: DC

## 2023-01-10 MED ORDER — LIDOCAINE 5 % EX PTCH
1.0000 | MEDICATED_PATCH | CUTANEOUS | Status: DC
  Administered 2023-01-10: 1 via TRANSDERMAL
  Filled 2023-01-10: qty 1

## 2023-01-10 NOTE — TOC Transition Note (Signed)
Transition of Care [facility]) - CM/SW Discharge Note   Patient Details  Name: Terry Hanson MRN: [id number] Date of Birth: 12-01-1940  Transition of Care [facility]) CM/SW Contact:  Leitha Bleak, RN Phone Number: 01/10/2023, 11:33 AM   Clinical Narrative:   Patient discharging home. Coralee North from [place] called, Patient is active with them for HHPT. TOC updated Morrie Sheldon with [facility] to cancel the referral.    Final next level of care: Home w Home Health Services Barriers to Discharge: Continued Medical Work up  Patient Goals and CMS Choice CMS Medicare.gov Compare Post Acute Care list provided to:: Patient Choice offered to / list presented to : Patient  Discharge Placement      Patient and family notified of of transfer: 01/10/23  Discharge Plan and Services Additional resources added to the After Visit Summary for      Post Acute Care Choice: Home Health                    HH Arranged: PT [facility] Agency: [facility] Date [facility] Agency Contacted: 01/09/23 Time HH Agency Contacted: 1245 Representative spoke with at [facility] Agency: Morrie Sheldon  Social Determinants of Health (SDOH) Interventions SDOH Screenings   Food Insecurity: No Food Insecurity (01/08/2023)  Housing: Low Risk  (01/08/2023)  Transportation Needs: No Transportation Needs (01/08/2023)  Utilities: Not At Risk (01/08/2023)  Tobacco Use: Medium Risk (01/08/2023)   Readmission Risk Interventions    01/09/2023    1:27 PM  Readmission Risk Prevention Plan  Transportation Screening Complete  PCP or Specialist Appt within 5-7 Days Not Complete  Home Care Screening Complete  Medication Review (RN CM) Complete

## 2023-01-10 NOTE — Discharge Summary (Signed)
Physician Discharge Summary  Terry Hanson OVF:[id number] DOB: 1941-03-01 DOA: 01/08/2023  Cardiologist: Atilano Median  Admit date: 01/08/2023 Discharge date: 01/10/2023  Admitted From:  Home  Disposition: Home with [facility]   Recommendations for Outpatient Follow-up:  Follow up with PCP in 2 weeks to establish care Establish care with Dr. Wolfgang Phoenix in 2-3 weeks regarding CKD Follow up with cardiology in 1-2 weeks  Please obtain BMP/CBC in 1-2 weeks  Home Health:  PT   Discharge Condition: STABLE   CODE STATUS: FULL  DIET: low sodium (strict) heart healthy    Brief Hospitalization Summary: Please see all hospital notes, images, labs for full details of the hospitalization. Admission provider HPI:  82 year old female with atrial fibrillation, coronary artery disease, asthma, heart failure, diabetes mellitus, chronic kidney disease hyperlipidemia, status post defibrillator placement and arthritis presented to the emergency department after a fall at home.  She says that she was down on the floor for about 45 minutes before someone was able to come and get her.  She was admitted to the hospital with mild case of rhabdomyolysis and acute kidney injury.  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 Wolfgang Phoenix for high risk nephrology care  - recheck in AM after more hydration - temporarily holding entresto and dapagliflozin in setting of AKI, progressive CKD   Chronic HFrEF/ischemic cardiomyopathy - EF  20-25% - resumed home digoxin, metoprolol, lasix, spironolactone - temporarily holding home entresto and dapagliflozin - close follow up with [facility] advised    Hyperlipidemia - resumed home simvastatin    Chronic atrial fibrillation - rate controlled on metoprolol - anticoagulated on apixaban   Leukocytosis - RESOLVED  - likely reactive to fall and rhabdo, resolved now  Discharge Diagnoses:  Principal Problem:   Fall Active Problems:   Chronic systolic CHF (congestive heart failure) (HCC)   Hypercholesterolemia   Stage 3 chronic kidney disease (HCC)   Atrial fibrillation, chronic (HCC)   AKI (acute kidney injury) (HCC)   Hyperkalemia   Rhabdomyolysis   Discharge Instructions: Discharge Instructions     Ambulatory referral to Cardiology   Complete by: As directed    Hospital Follow Up   Ambulatory referral to Nephrology   Complete by: As directed    Establish care regarding CKD      Allergies as of 01/10/2023       Reactions   Iodinated Contrast Media Shortness Of Breath   Per Patient- The last time she had contrast during a scan she had difficulty breathing   Codeine Hives   Shrimp [shellfish Allergy] Hives, Other (See Comments)   Broke out   Amiodarone Nausea And Vomiting   Penicillins Itching, Rash   swelling of the face/tongue/throat, SOB, or low BP sudden or severe rash/hives, skin peeling, or any reaction on the inside of your mouth or nose   Sulfa Antibiotics Itching        Medication List  STOP taking these medications    Entresto 24-26 MG Generic drug: sacubitril-valsartan   Farxiga 10 MG Tabs tablet Generic drug: dapagliflozin propanediol       TAKE these medications    acetaminophen 325 MG tablet Commonly known as: TYLENOL Take 2 tablets (650 mg total) by mouth 3 (three) times daily.   apixaban 2.5 MG Tabs tablet Commonly known as: ELIQUIS Take 1 tablet (2.5 mg total) by mouth 2 (two) times daily.    Digoxin 62.5 MCG Tabs Take 0.0625 mg by mouth 3 (three) times a week. Taking Monday, Wednesday, and Friday.   furosemide 20 MG tablet Commonly known as: Lasix Take 1 tablet (20 mg total) by mouth daily.   lidocaine 5 % Commonly known as: LIDODERM Place 1 patch onto the skin daily as needed (back pain). Remove & Discard patch within 12 hours or as directed by MD   metoprolol succinate 50 MG 24 hr tablet Commonly known as: TOPROL-XL TAKE 1 TABLET(50 MG) BY MOUTH AT BEDTIME What changed:  how much to take how to take this when to take this additional instructions   nitroGLYCERIN 0.4 MG SL tablet Commonly known as: Nitrostat Place 1 tablet (0.4 mg total) under the tongue every 5 (five) minutes as needed.   nystatin powder Commonly known as: MYCOSTATIN/NYSTOP Apply topically 3 (three) times daily.   oxycodone 5 MG capsule Commonly known as: OXY-IR Take 1 capsule (5 mg total) by mouth every 6 (six) hours as needed for pain.   pantoprazole 40 MG tablet Commonly known as: Protonix Take 1 tablet (40 mg total) by mouth daily.   simvastatin 20 MG tablet Commonly known as: ZOCOR TAKE 1 TABLET(20 MG) BY MOUTH EVERY EVENING   spironolactone 25 MG tablet Commonly known as: ALDACTONE Take 1 tablet (25 mg total) by mouth at bedtime.        Follow-up Information     [place], [facility] Follow up.   Why: PT will call to schedule your first home visit. Contact information: [address] [place] [address] [phone]         [facility] at [facility]. Schedule an appointment as soon as possible for a visit in 1 week(s).   Specialty: Cardiology Why: Hospital Follow Up Contact information: [address] [place] [address] [phone]        [facility]. Schedule an appointment as soon as possible for a visit in 2 week(s).   Why: Establish primary care and Hospital Follow Up Contact information: [address] [place] [address] [phone]        Randa Lynn, MD. Schedule an appointment as soon as possible for a visit in 1 week(s).   Specialty: Nephrology Why: Hospital Follow Up, establish care for chronic kidney disease Contact information: [address] [place] [address] [phone]                Allergies  Allergen Reactions   Iodinated Contrast Media Shortness Of Breath    Per Patient- The last time she had contrast during a scan she had difficulty breathing   Codeine Hives   Shrimp [Shellfish Allergy] Hives and Other (See Comments)    Broke out   Amiodarone Nausea And Vomiting   Penicillins Itching and Rash    swelling of the face/tongue/throat, SOB, or low BP sudden or severe rash/hives, skin peeling, or any reaction on the inside of your mouth or  nose    Sulfa Antibiotics Itching   Allergies as of 01/10/2023       Reactions   Iodinated Contrast Media Shortness Of Breath   Per Patient- The last time she had contrast during a scan she had difficulty breathing   Codeine Hives   Shrimp [shellfish Allergy] Hives, Other (See Comments)   Broke out   Amiodarone Nausea And Vomiting   Penicillins Itching, Rash   swelling of the face/tongue/throat, SOB, or low BP sudden or severe rash/hives, skin peeling, or any reaction on the inside of your mouth or nose   Sulfa Antibiotics Itching        Medication List     STOP taking these medications    Entresto 24-26 MG Generic drug: sacubitril-valsartan   Farxiga 10 MG Tabs tablet Generic drug: dapagliflozin propanediol       TAKE these medications    acetaminophen 325 MG tablet Commonly known as: TYLENOL Take 2 tablets (650 mg total) by mouth 3 (three) times daily.   apixaban 2.5 MG Tabs tablet Commonly known as: ELIQUIS Take 1 tablet (2.5 mg total) by mouth 2 (two) times daily.   Digoxin 62.5 MCG Tabs Take 0.0625 mg by mouth 3 (three) times  a week. Taking Monday, Wednesday, and Friday.   furosemide 20 MG tablet Commonly known as: Lasix Take 1 tablet (20 mg total) by mouth daily.   lidocaine 5 % Commonly known as: LIDODERM Place 1 patch onto the skin daily as needed (back pain). Remove & Discard patch within 12 hours or as directed by MD   metoprolol succinate 50 MG 24 hr tablet Commonly known as: TOPROL-XL TAKE 1 TABLET(50 MG) BY MOUTH AT BEDTIME What changed:  how much to take how to take this when to take this additional instructions   nitroGLYCERIN 0.4 MG SL tablet Commonly known as: Nitrostat Place 1 tablet (0.4 mg total) under the tongue every 5 (five) minutes as needed.   nystatin powder Commonly known as: MYCOSTATIN/NYSTOP Apply topically 3 (three) times daily.   oxycodone 5 MG capsule Commonly known as: OXY-IR Take 1 capsule (5 mg total) by mouth every 6 (six) hours as needed for pain.   pantoprazole 40 MG tablet Commonly known as: Protonix Take 1 tablet (40 mg total) by mouth daily.   simvastatin 20 MG tablet Commonly known as: ZOCOR TAKE 1 TABLET(20 MG) BY MOUTH EVERY EVENING   spironolactone 25 MG tablet Commonly known as: ALDACTONE Take 1 tablet (25 mg total) by mouth at bedtime.        Procedures/Studies: DG CHEST PORT 1 VIEW  Result Date: 01/10/2023 CLINICAL DATA:  Heart failure. EXAM: PORTABLE CHEST 1 VIEW COMPARISON:  01/08/2023 FINDINGS: Left chest wall ICD noted with single lead in the right ventricle. Stable mild cardiac enlargement. No pleural fluid, interstitial edema or airspace disease. The visualized osseous structures appear grossly intact. IMPRESSION: 1. No acute findings. 2. Mild cardiac enlargement. Electronically Signed   By: Signa Kell M.D.   On: 01/10/2023 10:58   CT CHEST ABDOMEN PELVIS WO CONTRAST  Result Date: 01/08/2023 CLINICAL DATA:  82 year old female status post fall on sidewalk. Landed on right side. EXAM: CT CHEST, ABDOMEN AND PELVIS WITHOUT CONTRAST  TECHNIQUE: Multidetector CT imaging of the chest, abdomen and pelvis was performed following the standard protocol without IV contrast. RADIATION DOSE REDUCTION: This exam was performed according to the departmental dose-optimization program which includes automated exposure control, adjustment of the mA and/or kV according to patient size and/or use  of iterative reconstruction technique. COMPARISON:  Noncontrast CT Abdomen and Pelvis 12/01/2022. FINDINGS: CT CHEST FINDINGS Cardiovascular: Left chest AICD. Calcified coronary artery atherosclerosis and/or stent. Calcified aortic atherosclerosis. Vascular patency is not evaluated in the absence of IV contrast. Mild cardiomegaly. No pericardial effusion. Mediastinum/Nodes: No mediastinal hematoma, mass, lymphadenopathy identified in the absence of contrast. Small chronic gastric hiatal hernia. Lungs/Pleura: Major airways are patent. Normal lung volumes. Both lungs are essentially clear. No pneumothorax, pleural effusion, pulmonary contusion. Musculoskeletal: Healing anterior rib fractures, right ribs 5 through 7 and left ribs 5 and 6. Periosteal new bone formation bilaterally. Visible shoulder osseous structures appear intact, aligned. Underlying osteopenia. No acute sternal fracture. No acute osseous abnormality identified in the thoracic spine. CT ABDOMEN PELVIS FINDINGS Hepatobiliary: Noncontrast liver and gallbladder appear stable. Dependent sludge or stones better demonstrated in September. No perihepatic fluid. Pancreas: Stable. Spleen: Stable.  No perisplenic fluid. Adrenals/Urinary Tract: Stable. Some chronic renal cortical atrophy. No hydronephrosis or urinary calculus. Stomach/Bowel: Chronic sigmoid diverticulosis. No dilated or inflamed bowel is evident on this noncontrast exam. Diminutive and normal appendix on coronal image 78. Small chronic gastric hiatal hernia. Otherwise negative stomach and duodenum. No free air or free fluid. Vascular/Lymphatic:  Extensive Aortoiliac calcified atherosclerosis. Normal caliber abdominal aorta. Vascular patency is not evaluated in the absence of IV contrast. No lymphadenopathy. Reproductive: Surgically absent. Other: No pelvis free fluid.  Chronic pelvic phleboliths. Musculoskeletal: Lower lumbar vacuum disc and facet arthropathy. Mild grade 1 anterolisthesis at L4-L5. Underlying osteopenia. Chronic and degenerative vacuum phenomena associated with the anterior right SI joint. No acute osseous abnormality identified. No acute superficial soft tissue injury identified. IMPRESSION: 1. Healing bilateral anterior rib fractures. 2. No acute traumatic injury identified in the noncontrast chest, abdomen, or pelvis. 3. Chronic findings including gallbladder sludge or stones, sigmoid diverticulosis, calcified coronary artery and Aortic Atherosclerosis (ICD10-I70.0). Electronically Signed   By: Odessa Fleming M.D.   On: 01/08/2023 10:55   CT CERVICAL SPINE WO CONTRAST  Result Date: 01/08/2023 CLINICAL DATA:  82 year old female status post fall on sidewalk. Landed on right side. EXAM: CT CERVICAL SPINE WITHOUT CONTRAST TECHNIQUE: Multidetector CT imaging of the cervical spine was performed without intravenous contrast. Multiplanar CT image reconstructions were also generated. RADIATION DOSE REDUCTION: This exam was performed according to the departmental dose-optimization program which includes automated exposure control, adjustment of the mA and/or kV according to patient size and/or use of iterative reconstruction technique. COMPARISON:  Head CT today.  Cervical spine CT 12/01/2022. FINDINGS: Alignment: Stable straightening of cervical lordosis. Cervicothoracic junction alignment is within normal limits. Bilateral posterior element alignment is within normal limits. Skull base and vertebrae: Visualized skull base is intact. No atlanto-occipital dissociation. Congenital incomplete ossification of the posterior C1 ring, normal variant. C1  and C2 appear intact and aligned. No acute osseous abnormality identified. Soft tissues and spinal canal: No prevertebral fluid or swelling. No visible canal hematoma. Partially retropharyngeal course of both cervical carotids which are heavily calcified. Disc levels: Stable cervical disc and endplate degeneration maximal at C5-C6. Upper chest: Lung apices are clear. Partially visible left thoracic inlet transvenous AICD lead. Chest CT reported separately. IMPRESSION: 1. No acute traumatic injury identified in the cervical spine. 2. Stable cervical spine degeneration. Calcified and retropharyngeal cervical carotids. Electronically Signed   By: Odessa Fleming M.D.   On: 01/08/2023 10:47   DG Chest 2 View  Result Date: 01/08/2023 CLINICAL DATA:  82 year old female status post fall on sidewalk. Landed on right side. EXAM: CHEST -  2 VIEW COMPARISON:  Right shoulder series today. Portable chest 12/01/2022 and earlier. FINDINGS: Upright AP and lateral views of the chest at 0811 hours. Left chest AICD is stable. Mild cardiomegaly. Other mediastinal contours are within normal limits. Visualized tracheal air column is within normal limits. No pneumothorax, pulmonary edema, pleural effusion or confluent lung opacity. 1 of the arms is not raised on the lateral view limiting its utility. No acute osseous abnormality identified. Negative visible bowel gas. IMPRESSION: 1. No acute cardiopulmonary abnormality or acute traumatic injury identified. 2. Mild cardiomegaly.  Chronic left chest AICD. Electronically Signed   By: Odessa Fleming M.D.   On: 01/08/2023 10:44   DG Shoulder Right  Result Date: 01/08/2023 CLINICAL DATA:  82 year old female status post fall on sidewalk. Landed on right side. EXAM: RIGHT SHOULDER - 2+ VIEW COMPARISON:  Portable chest 12/01/2022. FINDINGS: Three views of the right shoulder at 0805 hours. No glenohumeral joint dislocation. Proximal right humerus appears intact. Visible right scapula and clavicle appear  intact. Negative visible right ribs and chest. IMPRESSION: No acute fracture or dislocation identified about the right shoulder. Electronically Signed   By: Odessa Fleming M.D.   On: 01/08/2023 10:42   CT HEAD WO CONTRAST  Result Date: 01/08/2023 CLINICAL DATA:  82 year old female status post fall on sidewalk. Landed on right side. EXAM: CT HEAD WITHOUT CONTRAST TECHNIQUE: Contiguous axial images were obtained from the base of the skull through the vertex without intravenous contrast. RADIATION DOSE REDUCTION: This exam was performed according to the departmental dose-optimization program which includes automated exposure control, adjustment of the mA and/or kV according to patient size and/or use of iterative reconstruction technique. COMPARISON:  Head CT 12/01/2022. 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 Stable cerebral volume. Stable and elsewhere normal for age gray-white differentiation. No midline shift, ventriculomegaly, mass effect, evidence of mass lesion, intracranial hemorrhage or evidence of cortically based acute infarction. Vascular: Extensive Calcified atherosclerosis at the skull base. No suspicious intracranial vascular hyperdensity. Skull: Stable.  No acute osseous abnormality identified. Sinuses/Orbits: Visualized paranasal sinuses and mastoids are stable and well aerated. Other: No orbit or scalp soft tissue injury identified. IMPRESSION: 1. No acute intracranial abnormality or acute traumatic injury identified. 2. Chronic right cerebellar infarct. Electronically Signed   By: Odessa Fleming M.D.   On: 01/08/2023 10:39     Subjective: Pt reports she is feeling well, she has been ambulating in room and also ambulated with PT yesterday.  She is eager to go home due to pain in back from hospital mattress.  Pt says that she will follow up outpatient as recommended.   Discharge Exam: Vitals:   01/09/23 2117 01/10/23 0507  BP: (!) 106/34 (!) 123/44  Pulse: (!) 58  (!) 58  Resp:    Temp: 98.4 F (36.9 C) 98 F (36.7 C)  SpO2: 100% 100%   Vitals:   01/09/23 0748 01/09/23 1135 01/09/23 2117 01/10/23 0507  BP: (!) 107/46 108/80 (!) 106/34 (!) 123/44  Pulse: (!) 57 (!) 51 (!) 58 (!) 58  Resp: 20 18    Temp: 98.7 F (37.1 C) 98 F (36.7 C) 98.4 F (36.9 C) 98 F (36.7 C)  TempSrc: Oral Oral Oral Oral  SpO2: 95% 99% 100% 100%  Weight:      Height:       General: Pt is alert, awake, not in acute distress Cardiovascular: normal S1/S2 +, no rubs, no gallops Respiratory: CTA bilaterally, no wheezing, no rhonchi Abdominal: Soft,  NT, ND, bowel sounds + Extremities: trace edema BLEs, no cyanosis   The results of significant diagnostics from this hospitalization (including imaging, microbiology, ancillary and laboratory) are listed below for reference.    Microbiology: No results found for this or any previous visit (from the past 240 hour(s)).   Labs: BNP (last 3 results) No results for input(s): "BNP" in the last 8760 hours. Basic Metabolic Panel: Recent Labs  Lab 01/08/23 1104 01/08/23 2007 01/09/23 0516 01/10/23 0441  NA 135 135 133* 134*  K 5.5* 4.8 4.8 4.7  CL 103 105 104 104  CO2 21* 20* 21* 20*  GLUCOSE 149* 130* 131* 118*  BUN 49* 49* 57* 55*  CREATININE 2.80* 2.95* 3.08* 3.07*  CALCIUM 8.7* 8.2* 7.9* 8.2*  MG 2.4  --   --  2.3   Liver Function Tests: Recent Labs  Lab 01/08/23 1104  AST 43*  ALT 17  ALKPHOS 28*  BILITOT 0.7  PROT 6.6  ALBUMIN 3.4*   No results for input(s): "LIPASE", "AMYLASE" in the last 168 hours. No results for input(s): "AMMONIA" in the last 168 hours. CBC: Recent Labs  Lab 01/08/23 1104 01/09/23 0516 01/10/23 0441  WBC 15.7* 7.6 7.3  NEUTROABS  --  4.6 4.4  HGB 11.9* 9.6* 10.2*  HCT 37.8 30.4* 31.4*  MCV 101.3* 100.3* 101.6*  PLT 151 130* 140*   Cardiac Enzymes: Recent Labs  Lab 01/08/23 1145 01/09/23 0516 01/10/23 0441  CKTOTAL 2,831* 2,809* 2,308*   BNP: Invalid input(s):  "POCBNP" CBG: No results for input(s): "GLUCAP" in the last 168 hours. D-Dimer No results for input(s): "DDIMER" in the last 72 hours. Hgb A1c No results for input(s): "HGBA1C" in the last 72 hours. Lipid Profile No results for input(s): "CHOL", "HDL", "LDLCALC", "TRIG", "CHOLHDL", "LDLDIRECT" in the last 72 hours. Thyroid function studies No results for input(s): "TSH", "T4TOTAL", "T3FREE", "THYROIDAB" in the last 72 hours.  Invalid input(s): "FREET3" Anemia work up No results for input(s): "VITAMINB12", "FOLATE", "FERRITIN", "TIBC", "IRON", "RETICCTPCT" in the last 72 hours. Urinalysis    Component Value Date/Time   COLORURINE YELLOW 12/01/2022 1809   APPEARANCEUR HAZY (A) 12/01/2022 1809   LABSPEC 1.013 12/01/2022 1809   PHURINE 5.0 12/01/2022 1809   GLUCOSEU >=500 (A) 12/01/2022 1809   HGBUR MODERATE (A) 12/01/2022 1809   BILIRUBINUR NEGATIVE 12/01/2022 1809   KETONESUR NEGATIVE 12/01/2022 1809   PROTEINUR NEGATIVE 12/01/2022 1809   NITRITE NEGATIVE 12/01/2022 1809   LEUKOCYTESUR NEGATIVE 12/01/2022 1809   Sepsis Labs Recent Labs  Lab 01/08/23 1104 01/09/23 0516 01/10/23 0441  WBC 15.7* 7.6 7.3   Microbiology No results found for this or any previous visit (from the past 240 hour(s)).  Time coordinating discharge:  48 mins  SIGNED:  Standley Dakins, MD  [facility] 01/10/2023, 11:18 AM How to contact the [facility] Attending or Consulting provider 7A - 7P or covering provider during after hours 7P -7A, for this patient?  Check the care team in [facility] and look for a) attending/consulting [facility] provider listed and b) the [facility] team listed Log into [email or web address] and use [facility] universal password to access. If you do not have the password, please contact the hospital operator. Locate the [facility] provider you are looking for under [facility] and page to a number that you can be directly reached. If you still have difficulty reaching the provider, please page  the [facility] (Director on Call) for the Hospitalists listed on amion for assistance.

## 2023-01-11 ENCOUNTER — Telehealth: Payer: Self-pay | Admitting: Internal Medicine

## 2023-01-11 NOTE — Telephone Encounter (Signed)
Christy from [facility] is calling to get orders to restart PT after hospitalization She needs verbal order

## 2023-01-14 ENCOUNTER — Telehealth: Payer: Self-pay | Admitting: Internal Medicine

## 2023-01-14 DIAGNOSIS — R52 Pain, unspecified: Secondary | ICD-10-CM

## 2023-01-14 DIAGNOSIS — I5022 Chronic systolic (congestive) heart failure: Secondary | ICD-10-CM | POA: Diagnosis not present

## 2023-01-14 DIAGNOSIS — E8722 Chronic metabolic acidosis: Secondary | ICD-10-CM | POA: Diagnosis not present

## 2023-01-14 DIAGNOSIS — D631 Anemia in chronic kidney disease: Secondary | ICD-10-CM | POA: Diagnosis not present

## 2023-01-14 DIAGNOSIS — N184 Chronic kidney disease, stage 4 (severe): Secondary | ICD-10-CM | POA: Diagnosis not present

## 2023-01-14 NOTE — Telephone Encounter (Signed)
Follow UP:      Terry Hanson is calling today to ask for an order for PT 2 times a week for 4 weeks please.

## 2023-01-14 NOTE — Telephone Encounter (Signed)
Order placed for PCP and pt notified to take additional dose of diuretic for SOB or leg swelling. Pt voiced understanding.

## 2023-01-14 NOTE — Telephone Encounter (Signed)
Order placed for PCP and pt notified

## 2023-01-14 NOTE — Telephone Encounter (Signed)
New Message:     I have Cherise the nurse from [facility] that would like to talk to a nurse about some medical concerns about this patient.

## 2023-01-14 NOTE — Telephone Encounter (Signed)
Spoke with HHN Charise at [phone]. She states that the pt was just released from the hospital for a fall. Pt fell again on Oct. 24. States that she hit her abd. Nurse reports that Hr is normally in the 50's and 60's. Today he Hr is 103-113 while walking 5 feet. Last BM was on 01/08/23. And nurse notes wheezing. Pt told nurse that she feels " foggy headed". Pt is alert and oriented times 3.

## 2023-01-15 ENCOUNTER — Telehealth: Payer: Self-pay | Admitting: Internal Medicine

## 2023-01-15 NOTE — Telephone Encounter (Signed)
Spoke to Kerrick and verbalized providers ok to resume HH. Christy voiced understanding.

## 2023-01-15 NOTE — Telephone Encounter (Signed)
Neysa Bonito calling to get a verbal order for resumption of care for yesterday 10/28. Please advise

## 2023-01-16 ENCOUNTER — Other Ambulatory Visit (HOSPITAL_COMMUNITY): Payer: Self-pay | Admitting: Nephrology

## 2023-01-16 DIAGNOSIS — N184 Chronic kidney disease, stage 4 (severe): Secondary | ICD-10-CM

## 2023-01-21 ENCOUNTER — Telehealth: Payer: Self-pay | Admitting: Internal Medicine

## 2023-01-21 NOTE — Telephone Encounter (Signed)
Home Health wants verbal order stating patient may receive HH visits 2 times a week for 4 weeks.   I will forward to Dr.Mallipeddi

## 2023-01-21 NOTE — Telephone Encounter (Signed)
RN Joseph Pierini is requesting to speak our nurse in regards to orders for the patient. Please advise.

## 2023-01-22 ENCOUNTER — Ambulatory Visit (HOSPITAL_COMMUNITY)
Admission: RE | Admit: 2023-01-22 | Discharge: 2023-01-22 | Disposition: A | Discharge status: Home / Self Care | Payer: No Typology Code available for payment source | Source: Ambulatory Visit | Attending: Nephrology | Admitting: Nephrology

## 2023-01-22 DIAGNOSIS — N184 Chronic kidney disease, stage 4 (severe): Secondary | ICD-10-CM | POA: Diagnosis not present

## 2023-01-22 NOTE — Telephone Encounter (Signed)
I notified Charise with HH that MD provided instructions for [facility] at 2 visits twice a week for 1 month

## 2023-01-24 ENCOUNTER — Telehealth: Payer: Self-pay | Admitting: Internal Medicine

## 2023-01-24 DIAGNOSIS — Z7689 Persons encountering health services in other specified circumstances: Secondary | ICD-10-CM

## 2023-01-24 NOTE — Telephone Encounter (Signed)
Leah from [facility] calling to let doctor know that pt is unable to do PT . States pt has lower back pt and says it hurts too much to do PT.

## 2023-01-25 NOTE — Telephone Encounter (Signed)
PCP referral sent per Dr. Jenene Slicker.

## 2023-01-29 ENCOUNTER — Telehealth: Payer: Self-pay | Admitting: Internal Medicine

## 2023-01-29 NOTE — Telephone Encounter (Signed)
Caller Tobi Bastos) stated her field staff reported that patient lives alone and patient's daughter has refused to assist patient and only wants to be called for emergencies only.  Caller stated patient will need intervention.  Caller wants call back to [employer] at [phone] for further assessment.

## 2023-01-30 ENCOUNTER — Telehealth: Payer: Self-pay | Admitting: Internal Medicine

## 2023-01-30 DIAGNOSIS — I2510 Atherosclerotic heart disease of native coronary artery without angina pectoris: Secondary | ICD-10-CM | POA: Diagnosis not present

## 2023-01-30 DIAGNOSIS — Z7984 Long term (current) use of oral hypoglycemic drugs: Secondary | ICD-10-CM | POA: Diagnosis not present

## 2023-01-30 DIAGNOSIS — N1831 Chronic kidney disease, stage 3a: Secondary | ICD-10-CM | POA: Diagnosis not present

## 2023-01-30 DIAGNOSIS — D594 Other nonautoimmune hemolytic anemias: Secondary | ICD-10-CM | POA: Diagnosis not present

## 2023-01-30 DIAGNOSIS — I5022 Chronic systolic (congestive) heart failure: Secondary | ICD-10-CM | POA: Diagnosis not present

## 2023-01-30 DIAGNOSIS — E1122 Type 2 diabetes mellitus with diabetic chronic kidney disease: Secondary | ICD-10-CM | POA: Diagnosis not present

## 2023-01-30 DIAGNOSIS — Z7901 Long term (current) use of anticoagulants: Secondary | ICD-10-CM | POA: Diagnosis not present

## 2023-01-30 DIAGNOSIS — I4819 Other persistent atrial fibrillation: Secondary | ICD-10-CM | POA: Diagnosis not present

## 2023-01-30 DIAGNOSIS — W19XXXD Unspecified fall, subsequent encounter: Secondary | ICD-10-CM | POA: Diagnosis not present

## 2023-01-30 DIAGNOSIS — R55 Syncope and collapse: Secondary | ICD-10-CM | POA: Diagnosis not present

## 2023-01-30 NOTE — Telephone Encounter (Signed)
Patient's home health nurse is calling to discuss patient's weight loss at 183 lb today and patient's self measure at 180 lb 11/12. She states that is outside of their parameters and needed to report.   Would like to add that patient seems more irritable and depressed. Would like a call back to discuss Child psychotherapist.

## 2023-01-30 NOTE — Telephone Encounter (Signed)
Pt states the pharmacy told her she needs all new prescriptions for her medications. Please advise

## 2023-01-30 NOTE — Telephone Encounter (Signed)
Left a message for patient to call office back regarding prescription refill.

## 2023-02-01 NOTE — Telephone Encounter (Signed)
Left a message for Terry Hanson [facility], to call office back regarding pt.

## 2023-02-01 NOTE — Telephone Encounter (Signed)
 Left a message for patient to call office back regarding prescription refill.

## 2023-02-01 NOTE — Telephone Encounter (Signed)
Spoke to Terry Hanson with [facility] who stated that pt does not eat, pt is refusing showers, pt is refusing therapy. Ana would like provider to send orders for a social worker to visit pt to help with issues pt is facing.   Please advise.

## 2023-02-01 NOTE — Telephone Encounter (Signed)
Left a message for Tobi Bastos to give our office a call back regarding pt.

## 2023-02-01 NOTE — Telephone Encounter (Signed)
Left a message for Terry Hanson to call office back regarding social work order.

## 2023-02-01 NOTE — Telephone Encounter (Signed)
Leah, HHN, returning call. Transferred to [place], [place].

## 2023-02-01 NOTE — Telephone Encounter (Signed)
Leah, HHN notified and verbalized understanding.

## 2023-02-02 ENCOUNTER — Other Ambulatory Visit: Payer: Self-pay

## 2023-02-02 ENCOUNTER — Telehealth: Payer: Self-pay | Admitting: Physician Assistant

## 2023-02-02 ENCOUNTER — Encounter (HOSPITAL_COMMUNITY): Payer: Self-pay | Admitting: Emergency Medicine

## 2023-02-02 ENCOUNTER — Emergency Department (HOSPITAL_COMMUNITY)
Admission: EM | Admit: 2023-02-02 | Discharge: 2023-02-02 | Disposition: A | Discharge status: Home / Self Care | Payer: No Typology Code available for payment source | Attending: Emergency Medicine | Admitting: Emergency Medicine

## 2023-02-02 ENCOUNTER — Emergency Department (HOSPITAL_COMMUNITY): Payer: No Typology Code available for payment source

## 2023-02-02 DIAGNOSIS — I959 Hypotension, unspecified: Secondary | ICD-10-CM | POA: Diagnosis not present

## 2023-02-02 DIAGNOSIS — I2510 Atherosclerotic heart disease of native coronary artery without angina pectoris: Secondary | ICD-10-CM | POA: Insufficient documentation

## 2023-02-02 DIAGNOSIS — R0689 Other abnormalities of breathing: Secondary | ICD-10-CM | POA: Diagnosis not present

## 2023-02-02 DIAGNOSIS — I700 Atherosclerosis of aorta: Secondary | ICD-10-CM | POA: Diagnosis not present

## 2023-02-02 DIAGNOSIS — I471 Supraventricular tachycardia, unspecified: Secondary | ICD-10-CM

## 2023-02-02 DIAGNOSIS — E119 Type 2 diabetes mellitus without complications: Secondary | ICD-10-CM | POA: Insufficient documentation

## 2023-02-02 DIAGNOSIS — Z7901 Long term (current) use of anticoagulants: Secondary | ICD-10-CM | POA: Diagnosis not present

## 2023-02-02 DIAGNOSIS — R0602 Shortness of breath: Secondary | ICD-10-CM | POA: Insufficient documentation

## 2023-02-02 DIAGNOSIS — R000 Tachycardia, unspecified: Secondary | ICD-10-CM | POA: Diagnosis not present

## 2023-02-02 DIAGNOSIS — R0902 Hypoxemia: Secondary | ICD-10-CM | POA: Diagnosis not present

## 2023-02-02 DIAGNOSIS — R531 Weakness: Secondary | ICD-10-CM | POA: Insufficient documentation

## 2023-02-02 DIAGNOSIS — I4819 Other persistent atrial fibrillation: Secondary | ICD-10-CM

## 2023-02-02 LAB — BASIC METABOLIC PANEL
Anion gap: 11 (ref 5–15)
BUN: 54 mg/dL — ABNORMAL HIGH (ref 8–23)
CO2: 20 mmol/L — ABNORMAL LOW (ref 22–32)
Calcium: 9.5 mg/dL (ref 8.9–10.3)
Chloride: 104 mmol/L (ref 98–111)
Creatinine, Ser: 2.49 mg/dL — ABNORMAL HIGH (ref 0.44–1.00)
GFR, Estimated: 19 mL/min — ABNORMAL LOW (ref >60)
Glucose, Bld: 136 mg/dL — ABNORMAL HIGH (ref 70–99)
Potassium: 5.2 mmol/L — ABNORMAL HIGH (ref 3.5–5.1)
Sodium: 135 mmol/L (ref 135–145)

## 2023-02-02 LAB — CBC WITH DIFFERENTIAL/PLATELET
Abs Immature Granulocytes: 0.03 10*3/uL (ref 0.00–0.07)
Basophils Absolute: 0 10*3/uL (ref 0.0–0.1)
Basophils Relative: 1 %
Eosinophils Absolute: 0.1 10*3/uL (ref 0.0–0.5)
Eosinophils Relative: 2 %
HCT: 36.3 % (ref 36.0–46.0)
Hemoglobin: 12.1 g/dL (ref 12.0–15.0)
Immature Granulocytes: 0 %
Lymphocytes Relative: 12 %
Lymphs Abs: 1 10*3/uL (ref 0.7–4.0)
MCH: 32.5 pg (ref 26.0–34.0)
MCHC: 33.3 g/dL (ref 30.0–36.0)
MCV: 97.6 fL (ref 80.0–100.0)
Monocytes Absolute: 0.6 10*3/uL (ref 0.1–1.0)
Monocytes Relative: 7 %
Neutro Abs: 6.7 10*3/uL (ref 1.7–7.7)
Neutrophils Relative %: 78 %
Platelets: 200 10*3/uL (ref 150–400)
RBC: 3.72 MIL/uL — ABNORMAL LOW (ref 3.87–5.11)
RDW: 13.1 % (ref 11.5–15.5)
WBC: 8.5 10*3/uL (ref 4.0–10.5)
nRBC: 0 % (ref 0.0–0.2)

## 2023-02-02 LAB — TROPONIN I (HIGH SENSITIVITY): Troponin I (High Sensitivity): 37 ng/L — ABNORMAL HIGH (ref <18)

## 2023-02-02 MED ORDER — SIMVASTATIN 20 MG PO TABS: ORAL_TABLET | ORAL | Status: DC

## 2023-02-02 MED ORDER — APIXABAN 2.5 MG PO TABS: 2.5000 mg | ORAL_TABLET | Freq: Two times a day (BID) | ORAL | 2 refills | Status: DC

## 2023-02-02 MED ORDER — FUROSEMIDE 20 MG PO TABS: 20.0000 mg | ORAL_TABLET | Freq: Every day | ORAL | 3 refills | Status: DC

## 2023-02-02 MED ORDER — DIGOXIN 62.5 MCG PO TABS: 0.0625 mg | ORAL_TABLET | ORAL | 2 refills | Status: DC

## 2023-02-02 MED ORDER — APIXABAN 2.5 MG PO TABS
2.5000 mg | ORAL_TABLET | Freq: Two times a day (BID) | ORAL | 1 refills | Status: DC
Start: 2023-02-02 — End: 2023-04-03

## 2023-02-02 MED ORDER — SPIRONOLACTONE 25 MG PO TABS: 25.0000 mg | ORAL_TABLET | Freq: Every day | ORAL | 3 refills | Status: DC

## 2023-02-02 MED ORDER — METOPROLOL SUCCINATE ER 50 MG PO TB24: ORAL_TABLET | ORAL | 3 refills | Status: DC

## 2023-02-02 MED ORDER — PANTOPRAZOLE SODIUM 40 MG PO TBEC: 40.0000 mg | DELAYED_RELEASE_TABLET | Freq: Every day | ORAL | 1 refills | Status: AC

## 2023-02-02 MED ORDER — LIDOCAINE 5 % EX PTCH: 1.0000 | MEDICATED_PATCH | Freq: Every day | CUTANEOUS | 0 refills | Status: DC | PRN

## 2023-02-02 MED ORDER — APIXABAN 2.5 MG PO TABS
2.5000 mg | ORAL_TABLET | Freq: Two times a day (BID) | ORAL | 1 refills | Status: DC
Start: 2023-02-02 — End: 2023-02-02

## 2023-02-02 MED ORDER — LIDOCAINE 5 % EX PTCH: 1.0000 | MEDICATED_PATCH | Freq: Every day | CUTANEOUS | 0 refills | Status: AC | PRN

## 2023-02-02 MED ORDER — PANTOPRAZOLE SODIUM 40 MG PO TBEC: 40.0000 mg | DELAYED_RELEASE_TABLET | Freq: Every day | ORAL | 1 refills | Status: DC

## 2023-02-02 NOTE — ED Triage Notes (Signed)
Pt BIB [facility] from home after she began to have sudden SOB. Pt found to be in SVT on EMS arrival. Pt was being assisted to EMS stretcher pt converted. Pt has extensive cardiac hx and admits to being out of several of her daily medications.   Pt has not had her digoxin or lopressor.

## 2023-02-02 NOTE — Telephone Encounter (Signed)
Terry Hanson is a 82 y.o. female with hx of coronary artery disease s/p anterior STEMI in 06/2019 s/p PCI to LAD, ischemic CM, (HFrEF) heart failure with reduced ejection fraction, EF 25, s/p ICD, atrial fibrillation s/p ablation. She went to the ED at [facility] last night via EMS with weakness and shortness of breath. EMS  noted she was in Supraventricular Tachycardia with HRs in the 200s. She converted spontaneously to NSR in the ED. No tele strips are available in her chart. EKG showed NSR. Pt was out of meds. EDP refilled her meds.   She called the ED b/c she could not get her Digoxin filled. She was told it would take a few days to get it in. She did get all of her other meds.  PLAN:  - I reviewed vagal maneuvers with her - I asked her to make sure she continues her Metoprolol succinate  - She can get her Digoxin when it comes in - Keep follow up with Marjo Bicker, MD 11/25 as planned.  FYI - I did call TOC pharmacy at [facility]. Digoxin is on back order.  Tereso Newcomer, PA-C    02/02/2023 1:48 PM

## 2023-02-02 NOTE — ED Provider Notes (Signed)
[place] EMERGENCY DEPARTMENT AT [facility] Provider Note   CSN: [id number] Arrival date & time: 02/02/23  0104     History  Chief Complaint  Patient presents with   Shortness of Breath    Terry Hanson is a 82 y.o. female.  Patient is an 82 year old female with extensive past medical history including ischemic cardiomyopathy, coronary artery disease, atrial fibrillation, type 2 diabetes, anemia.  Patient presenting today with complaints of weakness and shortness of breath.  She got up this evening to go make a sandwich when she suddenly became weak and fatigued.  She called 911 and patient was transported here.  She was found to be in SVT with a rate of nearly 200, but converted spontaneously when being transferred from the EMS stretcher to the ER exam bed.  Patient denies chest pain.  She denies fevers, chills, or cough.  The history is provided by the patient.       Home Medications Prior to Admission medications   Medication Sig Start Date End Date Taking? Authorizing Provider  acetaminophen (TYLENOL) 325 MG tablet Take 2 tablets (650 mg total) by mouth 3 (three) times daily. 01/10/23   Johnson, Clanford L, MD  apixaban (ELIQUIS) 2.5 MG TABS tablet Take 1 tablet (2.5 mg total) by mouth 2 (two) times daily. 12/10/22   Azucena Fallen, MD  digoxin 62.5 MCG TABS Take 0.0625 mg by mouth 3 (three) times a week. Taking Monday, Wednesday, and Friday. 12/10/22   Azucena Fallen, MD  furosemide (LASIX) 20 MG tablet Take 1 tablet (20 mg total) by mouth daily. 05/01/22   Laurey Morale, MD  lidocaine (LIDODERM) 5 % Place 1 patch onto the skin daily as needed (back pain). Remove & Discard patch within 12 hours or as directed by MD 01/10/23   Standley Dakins L, MD  metoprolol succinate (TOPROL-XL) 50 MG 24 hr tablet TAKE 1 TABLET(50 MG) BY MOUTH AT BEDTIME Patient taking differently: Take 50 mg by mouth at bedtime. 05/01/22   Laurey Morale, MD  nitroGLYCERIN  (NITROSTAT) 0.4 MG SL tablet Place 1 tablet (0.4 mg total) under the tongue every 5 (five) minutes as needed. 07/02/19   Arty Baumgartner, NP  nystatin (MYCOSTATIN/NYSTOP) powder Apply topically 3 (three) times daily. 12/09/22   Azucena Fallen, MD  oxycodone (OXY-IR) 5 MG capsule Take 1 capsule (5 mg total) by mouth every 6 (six) hours as needed for pain. 01/10/23   Johnson, Clanford L, MD  pantoprazole (PROTONIX) 40 MG tablet Take 1 tablet (40 mg total) by mouth daily. 12/09/22 12/09/23  Azucena Fallen, MD  simvastatin (ZOCOR) 20 MG tablet TAKE 1 TABLET(20 MG) BY MOUTH EVERY EVENING 01/16/23   Johnson, Clanford L, MD  spironolactone (ALDACTONE) 25 MG tablet Take 1 tablet (25 mg total) by mouth at bedtime. 05/01/22   Laurey Morale, MD      Allergies    Iodinated contrast media, Codeine, Shrimp [shellfish allergy], Amiodarone, Penicillins, and Sulfa antibiotics    Review of Systems   Review of Systems  All other systems reviewed and are negative.   Physical Exam Updated Vital Signs Pulse 83   Temp 98.3 F (36.8 C) (Oral)   Resp 18   Ht 5\' 2"  (1.575 m)   Wt 88.9 kg   SpO2 100%   BMI 35.85 kg/m  Physical Exam Vitals and nursing note reviewed.  Constitutional:      General: She is not in acute distress.  Appearance: She is well-developed. She is not diaphoretic.  HENT:     Head: Normocephalic and atraumatic.  Cardiovascular:     Rate and Rhythm: Normal rate and regular rhythm.     Heart sounds: No murmur heard.    No friction rub. No gallop.  Pulmonary:     Effort: Pulmonary effort is normal. No respiratory distress.     Breath sounds: Normal breath sounds. No wheezing.  Abdominal:     General: Bowel sounds are normal. There is no distension.     Palpations: Abdomen is soft.     Tenderness: There is no abdominal tenderness.  Musculoskeletal:        General: Normal range of motion.     Cervical back: Normal range of motion and neck supple.  Skin:    General:  Skin is warm and dry.  Neurological:     General: No focal deficit present.     Mental Status: She is alert and oriented to person, place, and time.     ED Results / Procedures / Treatments   Labs (all labs ordered are listed, but only abnormal results are displayed) Labs Reviewed - No data to display  EKG EKG Interpretation Date/Time:  Saturday February 02 2023 01:13:53 EST Ventricular Rate:  87 PR Interval:  140 QRS Duration:  97 QT Interval:  349 QTC Calculation: 420 R Axis:   40  Text Interpretation: Sinus rhythm Low voltage, precordial leads Consider anterior infarct Minimal ST depression, inferior leads No significant change since 01/08/2023 Confirmed by Geoffery Lyons ([id number]) on 02/02/2023 1:17:34 AM  Radiology No results found.  Procedures Procedures    Medications Ordered in ED Medications - No data to display  ED Course/ Medical Decision Making/ A&P  Patient is an 82 year old female presenting with complaints of weakness.  This started acutely when she got up to go make a sandwich.  She was found to be in SVT by EMS, however this resolved as she was being loaded into the ER exam room.  Patient is now symptom-free.  She arrives with stable vital signs and is afebrile.  Physical examination basically unremarkable.  Laboratory studies obtained including CBC, metabolic panel, and troponin.  Laboratory studies show baseline renal insufficiency with a mildly elevated troponin of 37 which is chronic for her.  Chest x-ray shows no acute process.  Patient has been observed for several hours in the ER, during which time she has remained symptom-free.  Patient feels comfortable with going home.  She does tell me that she is out of all of her medications and has been out for the past several days.  I have refilled her medications and sent prescriptions to her pharmacy.    Final Clinical Impression(s) / ED Diagnoses Final diagnoses:  None    Rx / DC Orders ED Discharge  Orders     None         Geoffery Lyons, MD 02/02/23 [phone]

## 2023-02-02 NOTE — Discharge Instructions (Signed)
Resume taking your medications as previously prescribed.  Refills of these have been supplied in the emergency room this evening.  Follow-up with your primary doctor next week, and return to the ER if symptoms worsen or change.

## 2023-02-04 ENCOUNTER — Telehealth (HOSPITAL_COMMUNITY): Payer: Self-pay | Admitting: Physician Assistant

## 2023-02-04 MED ORDER — DIGOXIN 62.5 MCG PO TABS: 0.0625 mg | ORAL_TABLET | ORAL | 2 refills | Status: DC

## 2023-02-04 NOTE — Telephone Encounter (Cosign Needed)
Patient called stating she needed a prescription for her digoxin.  Had recent visit but did not get prescription on her paperwork.  She takes half a tablet of .125 3 times per week per her record. I will send to pharmacy

## 2023-02-05 ENCOUNTER — Other Ambulatory Visit: Payer: Self-pay | Admitting: Family Medicine

## 2023-02-05 ENCOUNTER — Telehealth: Payer: Self-pay | Admitting: Internal Medicine

## 2023-02-05 MED ORDER — METOPROLOL SUCCINATE ER 50 MG PO TB24: ORAL_TABLET | ORAL | 3 refills | Status: AC

## 2023-02-05 MED ORDER — SPIRONOLACTONE 25 MG PO TABS: 25.0000 mg | ORAL_TABLET | Freq: Every day | ORAL | 3 refills | Status: DC

## 2023-02-05 MED ORDER — APIXABAN 2.5 MG PO TABS: 2.5000 mg | ORAL_TABLET | Freq: Two times a day (BID) | ORAL | 3 refills | Status: DC

## 2023-02-05 MED ORDER — FUROSEMIDE 20 MG PO TABS: 20.0000 mg | ORAL_TABLET | Freq: Every day | ORAL | 3 refills | Status: AC

## 2023-02-05 NOTE — Telephone Encounter (Signed)
Spoke with Tacey Ruiz, PT who reports she visited with pt today.  BP 142/80 and HR - 45, manually checked.  Pt reported she is feeling fine without any symptoms of dizziness or lightheadedness.  PT reports pt states she is hydrated and has eaten today.  Pt was seen at [facility] ED 11/16 for SVT.  According to note pt had been out of medications.  Pt restarted on Digoxin 0.0625mg  - 1 tablet three times per week, Furosemide 20mg  - 1 tablet daily, Metoprolol 50mg  at bedtime, Spironlactone - 25mg  at bedtime and Apixaban - 2.5mg  - 1 tablet bid.   PT advised will forward to [facility] triage for further assistance as she is seen by Dr Jenene Slicker in [place] and is scheduled for a f/u appt on 02/11/2023.  PT thanked RN for the assistance.

## 2023-02-05 NOTE — Telephone Encounter (Signed)
Prescription refill request for Eliquis received. Indication: Last office visit: 10/09/22  Rosette Reveal MD Scr: 2.49 on 02/02/23  Epic Age: 82 Weight: 88kg  Based on above findings Eliquis 2.5mg  twice daily is the appropriate dose.  Refill approved.

## 2023-02-05 NOTE — Telephone Encounter (Signed)
Spoke to Terry Hanson and verbalized providers okay for social work consult. Lorene Dy stated that pt is up for PT recertification next week and they will add the consult onto her recertification. Lorene Dy had no other questions or concerns at this time.

## 2023-02-05 NOTE — Telephone Encounter (Signed)
STAT if HR is under 50 or over 120 (normal HR is 60-100 beats per minute)  What is your heart rate? 45   Do you have a log of your heart rate readings (document readings)? 84,132,44,01  Do you have any other symptoms? Pt is experiencing low HR and high BP

## 2023-02-05 NOTE — Telephone Encounter (Signed)
Medications refilled and sent to pharmacy per pt's request. Will route note to [facility] to refill Apixaban.

## 2023-02-08 ENCOUNTER — Telehealth: Payer: Self-pay | Admitting: Internal Medicine

## 2023-02-08 NOTE — Telephone Encounter (Signed)
Caller (Charisse) wants to get orders to continue patient's home health physical therapy.    Caller stated patient's weight today is 184 lbs and patient has been working on her diet.  Caller noted patient weighed 180 lbs on 11/13.

## 2023-02-11 ENCOUNTER — Ambulatory Visit: Payer: No Typology Code available for payment source

## 2023-02-11 ENCOUNTER — Ambulatory Visit: Payer: No Typology Code available for payment source | Attending: Internal Medicine | Admitting: Internal Medicine

## 2023-02-11 ENCOUNTER — Other Ambulatory Visit (HOSPITAL_COMMUNITY)
Admission: RE | Admit: 2023-02-11 | Discharge: 2023-02-11 | Disposition: A | Discharge status: Home / Self Care | Payer: No Typology Code available for payment source | Source: Ambulatory Visit | Attending: Internal Medicine | Admitting: Internal Medicine

## 2023-02-11 VITALS — BP 130/68 | HR 75 | Ht 62.0 in | Wt 187.8 lb

## 2023-02-11 DIAGNOSIS — Z79899 Other long term (current) drug therapy: Secondary | ICD-10-CM | POA: Diagnosis not present

## 2023-02-11 DIAGNOSIS — I480 Paroxysmal atrial fibrillation: Secondary | ICD-10-CM | POA: Insufficient documentation

## 2023-02-11 LAB — DIGOXIN LEVEL: Digoxin Level: 0.5 ng/mL — ABNORMAL LOW (ref 0.8–2.0)

## 2023-02-11 NOTE — Patient Instructions (Signed)
Medication Instructions:  Your physician recommends that you continue on your current medications as directed. Please refer to the Current Medication list given to you today.  *If you need a refill on your cardiac medications before your next appointment, please call your pharmacy*   Lab Work: [place] Level- Today  BMP- 1 month  If you have labs (blood work) drawn today and your tests are completely normal, you will receive your results only by: MyChart Message (if you have MyChart) OR A paper copy in the mail If you have any lab test that is abnormal or we need to change your treatment, we will call you to review the results.   Testing/Procedures: None   Follow-Up: At [facility], you and your health needs are our priority.  As part of our continuing mission to provide you with exceptional heart care, we have created designated Provider Care Teams.  These Care Teams include your primary Cardiologist (physician) and Advanced Practice Providers (APPs -  Physician Assistants and Nurse Practitioners) who all work together to provide you with the care you need, when you need it.  We recommend signing up for the patient portal called "MyChart".  Sign up information is provided on this After Visit Summary.  MyChart is used to connect with patients for Virtual Visits (Telemedicine).  Patients are able to view lab/test results, encounter notes, upcoming appointments, etc.  Non-urgent messages can be sent to your provider as well.   To learn more about what you can do with MyChart, go to [email or web address].    Your next appointment:   6 month(s)  Provider:   You may see Vishnu P Mallipeddi, MD or one of the following Advanced Practice Providers on your designated Care Team:   [place], PA-C  Jacolyn Reedy, [place]     Other Instructions

## 2023-02-11 NOTE — Progress Notes (Signed)
Cardiology Office Note  Date: 02/11/2023   Terry: JOON Hanson, DOB 10-16-1940, MRN [id number]  PCP:  Pcp, No  Cardiologist:  Marjo Bicker, MD Electrophysiologist:  Lewayne Bunting, MD   History of Present Illness: Terry Hanson is a 82 y.o. female  She had 2 hospitalizations, first was due to ICD shock from VT presumed to be due to electrolyte derangements.  LHC was deferred.  Her hemoglobin was also noted to be around 6-ish which increased to 11 s/p 1U PRBC transfusion, likely lab error and EGD was deferred.  The second hospitalization was from fall.  After prescribing walker, she is walking with no falls.  Doing great otherwise, no DOE, orthopnea, PND, angina.  She does feel dizzy sometimes and occasional tingling in her lower extremities but her BP and HR are normal.  Strongly encouraged her to make an appointment with the PCP.  Past Medical History:  Diagnosis Date   Arthritis    Asthma    Chronic HFrEF (heart failure with reduced ejection fraction) (HCC)    Chronic kidney disease, stage 3b (HCC)    Diabetes mellitus (HCC)    Hypercholesterolemia    Hyperlipidemia    Ischemic cardiomyopathy    STEMI (ST elevation myocardial infarction) (HCC)    a. 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    Past Surgical History:  Procedure Laterality Date   ABDOMINAL HYSTERECTOMY     ATRIAL FIBRILLATION ABLATION N/A 04/05/2021   Procedure: ATRIAL FIBRILLATION ABLATION;  Surgeon: Regan Lemming, MD;  Location: [facility] INVASIVE CV LAB;  Service: Cardiovascular;  Laterality: N/A;   CATARACT EXTRACTION W/PHACO Right 06/27/2020   Procedure: CATARACT EXTRACTION PHACO AND INTRAOCULAR LENS PLACEMENT RIGHT EYE AND PLACEMENT OF CORTICOSTEROID;  Surgeon: Fabio Pierce, MD;  Location: [facility] ORS;  Service: Ophthalmology;  Laterality: Right;  right CDE=25.47   CORONARY/GRAFT ACUTE MI REVASCULARIZATION N/A 06/29/2019   Procedure: Coronary/Graft Acute MI Revascularization;  Surgeon: Lyn Records, MD;   Location: [facility] INVASIVE CV LAB;  Service: Cardiovascular;  Laterality: N/A;   ICD IMPLANT N/A 12/14/2019   Procedure: ICD IMPLANT;  Surgeon: Marinus Maw, MD;  Location: [facility] INVASIVE CV LAB;  Service: Cardiovascular;  Laterality: N/A;   LEFT HEART CATH AND CORONARY ANGIOGRAPHY N/A 06/29/2019   Procedure: LEFT HEART CATH AND CORONARY ANGIOGRAPHY;  Surgeon: Lyn Records, MD;  Location: [facility] INVASIVE CV LAB;  Service: Cardiovascular;  Laterality: N/A;   RIGHT HEART CATH N/A 12/30/2020   Procedure: RIGHT HEART CATH;  Surgeon: Laurey Morale, MD;  Location: [facility] INVASIVE CV LAB;  Service: Cardiovascular;  Laterality: N/A;   TEE WITHOUT CARDIOVERSION N/A 04/04/2021   Procedure: TRANSESOPHAGEAL ECHOCARDIOGRAM (TEE);  Surgeon: Jake Bathe, MD;  Location: [facility] ENDOSCOPY;  Service: Cardiovascular;  Laterality: N/A;    Current Outpatient Medications  Medication Sig Dispense Refill   acetaminophen (TYLENOL) 325 MG tablet Take 2 tablets (650 mg total) by mouth 3 (three) times daily. 60 tablet 0   apixaban (ELIQUIS) 2.5 MG TABS tablet Take 1 tablet (2.5 mg total) by mouth 2 (two) times daily. 60 tablet 2   Digoxin 62.5 MCG TABS Take 0.0625 mg by mouth 3 (three) times a week. Taking Monday, Wednesday, and Friday. 12 tablet 2   furosemide (LASIX) 20 MG tablet Take 1 tablet (20 mg total) by mouth daily. 90 tablet 3   lidocaine (LIDODERM) 5 % Place 1 patch onto the skin daily as needed (back pain). Remove & Discard patch within 12 hours  or as directed by MD 30 patch 0   metoprolol succinate (TOPROL-XL) 50 MG 24 hr tablet TAKE 1 TABLET(50 MG) BY MOUTH AT BEDTIME 90 tablet 3   nitroGLYCERIN (NITROSTAT) 0.4 MG SL tablet Place 1 tablet (0.4 mg total) under the tongue every 5 (five) minutes as needed. 25 tablet 2   nystatin (MYCOSTATIN/NYSTOP) powder Apply topically 3 (three) times daily. 15 g 0   oxycodone (OXY-IR) 5 MG capsule Take 1 capsule (5 mg total) by mouth every 6 (six) hours as needed for pain. 20 capsule 0    pantoprazole (PROTONIX) 40 MG tablet Take 1 tablet (40 mg total) by mouth daily. 30 tablet 1   simvastatin (ZOCOR) 20 MG tablet TAKE 1 TABLET(20 MG) BY MOUTH EVERY EVENING     spironolactone (ALDACTONE) 25 MG tablet Take 1 tablet (25 mg total) by mouth at bedtime. 90 tablet 3   No current facility-administered medications for this visit.   Allergies:  Iodinated contrast media, Codeine, Shrimp [shellfish allergy], Amiodarone, Penicillins, and Sulfa antibiotics   Social History: The patient  reports that she has never smoked. She has quit using smokeless tobacco.  Her smokeless tobacco use included chew. She reports that she does not currently use alcohol. She reports that she does not use drugs.   Family History: The patient's family history includes Heart disease in her mother.   ROS:  Please see the history of present illness. Otherwise, complete review of systems is positive for none.  All other systems are reviewed and negative.   Physical Exam: VS:  BP 130/68 (BP Location: Left Arm)   Pulse 75   Ht 5\' 2"  (1.575 m)   Wt 187 lb 12.8 oz (85.2 kg)   SpO2 96%   BMI 34.35 kg/m , BMI Body mass index is 34.35 kg/m.  Wt Readings from Last 3 Encounters:  02/11/23 187 lb 12.8 oz (85.2 kg)  02/02/23 195 lb 15.8 oz (88.9 kg)  01/08/23 196 lb (88.9 kg)    General: Patient appears comfortable at rest. HEENT: Conjunctiva and lids normal, oropharynx clear with moist mucosa. Neck: Supple, no elevated JVP or carotid bruits, no thyromegaly. Lungs: Clear to auscultation, nonlabored breathing at rest. Cardiac: Regular rate and rhythm, no S3 or significant systolic murmur, no pericardial rub. Abdomen: Soft, nontender, no hepatomegaly, bowel sounds present, no guarding or rebound. Extremities: 1+ pitting edema, distal pulses 2+. Skin: Warm and dry. Musculoskeletal: No kyphosis. Neuropsychiatric: Alert and oriented x3, affect grossly appropriate.  Recent Labwork: 12/01/2022: TSH  2.478 01/08/2023: ALT 17; AST 43 01/10/2023: Magnesium 2.3 02/02/2023: BUN 54; Creatinine, Ser 2.49; Hemoglobin 12.1; Platelets 200; Potassium 5.2; Sodium 135     Component Value Date/Time   CHOL 201 (H) 03/24/2021 0754   TRIG 187 (H) 03/24/2021 0754   HDL 36 (L) 03/24/2021 0754   CHOLHDL 5.6 (H) 03/24/2021 0754   CHOLHDL 5.5 02/28/2021 1110   VLDL 24 02/28/2021 1110   LDLCALC 131 (H) 03/24/2021 0754     Assessment and Plan:  CAD manifested by anterior STEMI in 06/2019 s/p LAD PCI Ischemic cardiomyopathy s/p [facility] Jude ICD Chronic systolic heart failure LVEF 25% ICD shocks x 2 Paroxysmal A-fib s/p PVI in 2023   -Compensated, continue GDMT, p.o. Lasix 20 mg once daily, metoprolol succinate 50 mg once daily, Farxiga 10 mg once daily, spironolactone 25 mg once daily, digoxin 0.0625 mg 3 times a week.  Sherryll Burger was held during the prior hospital admission due to hypotension.  Obtain digoxin levels now and  BMP levels in 1 month.  Strongly encouraged to start following with PCP for management of noncardiac conditions. -EKG from 01/2023 showed NSR, continue metoprolol succinate 50 mg once daily and Eliquis 2.5 mg twice daily.  She had a fall recently but after starting to use a walker, she did not have any more falls.  Okay to continue Eliquis.  She did have ICD shocks x 2 in 2024, follows with the EP.  The first ICD shock was presumed to be an appropriate due to A-fib with RVR second ICD shock was presumed VT likely secondary electrolyte derangements.  LHC was deferred.  She also had erroneous Hb lab value requiring PRBC transfusion however repeat CBC was within normal limits, GI deferred endoscopy which I agree.   I spent a total duration 30 minutes reviewing the prior notes, echo report, discussed management of heart failure and counseling provided.    Medication Adjustments/Labs and Tests Ordered: Current medicines are reviewed at length with the patient today.  Concerns regarding  medicines are outlined above.    Disposition:  Follow up 6 months  Signed, Francelia Mclaren Verne Spurr, MD, 02/11/2023 10:56 AM    [facility] at [facility] [address] [place], [place] [address]

## 2023-02-11 NOTE — Telephone Encounter (Signed)
Terry Hanson is calling requesting verbal orders for continued physical therapy 1x a week for four weeks.   Please advise.

## 2023-02-11 NOTE — Telephone Encounter (Signed)
Left a message for Terry Hanson to call office back regarding orders for PT.

## 2023-02-12 NOTE — Telephone Encounter (Signed)
Received PT orders from [facility]. Will have provider look them over and fax back.

## 2023-02-13 DIAGNOSIS — N1831 Chronic kidney disease, stage 3a: Secondary | ICD-10-CM | POA: Diagnosis not present

## 2023-02-13 DIAGNOSIS — I2510 Atherosclerotic heart disease of native coronary artery without angina pectoris: Secondary | ICD-10-CM | POA: Diagnosis not present

## 2023-02-13 DIAGNOSIS — I4819 Other persistent atrial fibrillation: Secondary | ICD-10-CM | POA: Diagnosis not present

## 2023-02-13 DIAGNOSIS — E1122 Type 2 diabetes mellitus with diabetic chronic kidney disease: Secondary | ICD-10-CM | POA: Diagnosis not present

## 2023-02-13 DIAGNOSIS — W19XXXD Unspecified fall, subsequent encounter: Secondary | ICD-10-CM | POA: Diagnosis not present

## 2023-02-13 DIAGNOSIS — E669 Obesity, unspecified: Secondary | ICD-10-CM | POA: Diagnosis not present

## 2023-02-13 DIAGNOSIS — R55 Syncope and collapse: Secondary | ICD-10-CM | POA: Diagnosis not present

## 2023-02-13 DIAGNOSIS — D594 Other nonautoimmune hemolytic anemias: Secondary | ICD-10-CM | POA: Diagnosis not present

## 2023-02-13 DIAGNOSIS — I5022 Chronic systolic (congestive) heart failure: Secondary | ICD-10-CM | POA: Diagnosis not present

## 2023-02-13 DIAGNOSIS — Z7901 Long term (current) use of anticoagulants: Secondary | ICD-10-CM | POA: Diagnosis not present

## 2023-02-21 ENCOUNTER — Telehealth: Payer: Self-pay | Admitting: Internal Medicine

## 2023-02-21 DIAGNOSIS — E669 Obesity, unspecified: Secondary | ICD-10-CM | POA: Diagnosis not present

## 2023-02-21 DIAGNOSIS — E1122 Type 2 diabetes mellitus with diabetic chronic kidney disease: Secondary | ICD-10-CM | POA: Diagnosis not present

## 2023-02-21 DIAGNOSIS — Z7901 Long term (current) use of anticoagulants: Secondary | ICD-10-CM | POA: Diagnosis not present

## 2023-02-21 DIAGNOSIS — I5022 Chronic systolic (congestive) heart failure: Secondary | ICD-10-CM | POA: Diagnosis not present

## 2023-02-21 DIAGNOSIS — I2510 Atherosclerotic heart disease of native coronary artery without angina pectoris: Secondary | ICD-10-CM | POA: Diagnosis not present

## 2023-02-21 DIAGNOSIS — N1831 Chronic kidney disease, stage 3a: Secondary | ICD-10-CM | POA: Diagnosis not present

## 2023-02-21 DIAGNOSIS — W19XXXD Unspecified fall, subsequent encounter: Secondary | ICD-10-CM | POA: Diagnosis not present

## 2023-02-21 DIAGNOSIS — I4819 Other persistent atrial fibrillation: Secondary | ICD-10-CM | POA: Diagnosis not present

## 2023-02-21 DIAGNOSIS — R55 Syncope and collapse: Secondary | ICD-10-CM | POA: Diagnosis not present

## 2023-02-21 DIAGNOSIS — D594 Other nonautoimmune hemolytic anemias: Secondary | ICD-10-CM | POA: Diagnosis not present

## 2023-02-21 NOTE — Telephone Encounter (Signed)
Vishnu P Mallipeddi, MD 02/11/2023  4:18 PM EST     Continue current digoxin.

## 2023-02-21 NOTE — Telephone Encounter (Signed)
Patient returned staff call regarding results.

## 2023-02-22 DIAGNOSIS — R809 Proteinuria, unspecified: Secondary | ICD-10-CM | POA: Diagnosis not present

## 2023-02-22 DIAGNOSIS — N184 Chronic kidney disease, stage 4 (severe): Secondary | ICD-10-CM | POA: Diagnosis not present

## 2023-02-22 DIAGNOSIS — D631 Anemia in chronic kidney disease: Secondary | ICD-10-CM | POA: Diagnosis not present

## 2023-02-27 DIAGNOSIS — N184 Chronic kidney disease, stage 4 (severe): Secondary | ICD-10-CM | POA: Diagnosis not present

## 2023-02-27 DIAGNOSIS — D631 Anemia in chronic kidney disease: Secondary | ICD-10-CM | POA: Diagnosis not present

## 2023-02-27 DIAGNOSIS — E8722 Chronic metabolic acidosis: Secondary | ICD-10-CM | POA: Diagnosis not present

## 2023-02-27 DIAGNOSIS — E875 Hyperkalemia: Secondary | ICD-10-CM | POA: Diagnosis not present

## 2023-03-04 ENCOUNTER — Ambulatory Visit (INDEPENDENT_AMBULATORY_CARE_PROVIDER_SITE_OTHER): Payer: No Typology Code available for payment source

## 2023-03-04 DIAGNOSIS — I255 Ischemic cardiomyopathy: Secondary | ICD-10-CM

## 2023-03-04 DIAGNOSIS — I5022 Chronic systolic (congestive) heart failure: Secondary | ICD-10-CM

## 2023-03-05 LAB — CUP PACEART REMOTE DEVICE CHECK
Battery Remaining Longevity: 85 mo
Battery Remaining Percentage: 70 %
Battery Voltage: 2.99 V
Brady Statistic RV Percent Paced: 1 %
Date Time Interrogation Session: 20241217091920
HighPow Impedance: 91 Ohm
Implantable Lead Connection Status: 753985
Implantable Lead Implant Date: 20210927
Implantable Lead Location: 753860
Implantable Pulse Generator Implant Date: 20210927
Lead Channel Impedance Value: 380 Ohm
Lead Channel Pacing Threshold Amplitude: 0.75 V
Lead Channel Pacing Threshold Pulse Width: 0.5 ms
Lead Channel Sensing Intrinsic Amplitude: 11.6 mV
Lead Channel Setting Pacing Amplitude: 2.5 V
Lead Channel Setting Pacing Pulse Width: 0.5 ms
Lead Channel Setting Sensing Sensitivity: 0.5 mV
Pulse Gen Serial Number: 810006603
Zone Setting Status: 755011

## 2023-03-08 ENCOUNTER — Other Ambulatory Visit (HOSPITAL_COMMUNITY)
Admission: RE | Admit: 2023-03-08 | Discharge: 2023-03-08 | Disposition: A | Discharge status: Home / Self Care | Payer: No Typology Code available for payment source | Source: Ambulatory Visit | Attending: Internal Medicine | Admitting: Internal Medicine

## 2023-03-08 DIAGNOSIS — Z79899 Other long term (current) drug therapy: Secondary | ICD-10-CM | POA: Diagnosis not present

## 2023-03-08 LAB — BASIC METABOLIC PANEL
Anion gap: 8 (ref 5–15)
BUN: 29 mg/dL — ABNORMAL HIGH (ref 8–23)
CO2: 27 mmol/L (ref 22–32)
Calcium: 9.5 mg/dL (ref 8.9–10.3)
Chloride: 102 mmol/L (ref 98–111)
Creatinine, Ser: 2.14 mg/dL — ABNORMAL HIGH (ref 0.44–1.00)
GFR, Estimated: 23 mL/min — ABNORMAL LOW (ref >60)
Glucose, Bld: 133 mg/dL — ABNORMAL HIGH (ref 70–99)
Potassium: 4.7 mmol/L (ref 3.5–5.1)
Sodium: 137 mmol/L (ref 135–145)

## 2023-03-18 ENCOUNTER — Other Ambulatory Visit: Payer: Self-pay

## 2023-03-18 MED ORDER — DIGOXIN 62.5 MCG PO TABS: 0.0625 mg | ORAL_TABLET | ORAL | 3 refills | Status: DC

## 2023-03-25 ENCOUNTER — Telehealth: Payer: Self-pay | Admitting: Internal Medicine

## 2023-03-25 MED ORDER — DIGOXIN 62.5 MCG PO TABS: 0.0625 mg | ORAL_TABLET | ORAL | 3 refills | Status: AC

## 2023-03-25 NOTE — Telephone Encounter (Signed)
 Pt c/o medication issue:  1. Name of Medication:  Digoxin  62.5 MCG TABS  2. How are you currently taking this medication (dosage and times per day)?   3. Are you having a reaction (difficulty breathing--STAT)?   4. What is your medication issue?   Heather with [facility] states they are unable to get a 62.5 MG tablet and they need the prescription written for 1/2 a 125 MG tablet instead.

## 2023-03-25 NOTE — Telephone Encounter (Signed)
 Pt c/o medication issue:  1. Name of Medication:   Digoxin  62.5 MCG TABS    2. How are you currently taking this medication (dosage and times per day)?   Take 0.0625 mg by mouth 3 (three) times a week. Taking Monday, Wednesday, and Friday.    3. Are you having a reaction (difficulty breathing--STAT)? No  4. What is your medication issue? Pharmacy is not letting patient get her medication and the pt can not figure out why. Please advise

## 2023-03-25 NOTE — Telephone Encounter (Signed)
 Spoke with pt who states that she has not taken her Digoxin  in 2 weeks. Pt reports that she has taken her Lasix . She c/o SOB and weight gain of 8 lbs over the last 2 wks. Today's weight is 198 lbs. No other complaints at this time. Digoxin  escribed to Pharmacy. Please advise.

## 2023-03-25 NOTE — Telephone Encounter (Signed)
 Digoxin called in as requested.

## 2023-03-26 NOTE — Telephone Encounter (Signed)
 Verify taking lasix 20mg  daily and if so increase to 40mg  daily, update [place] on weights and breathing on Friday   Dominga Ferry MD

## 2023-03-26 NOTE — Telephone Encounter (Signed)
 Returned call to pt. No answer. Left msg to call back.

## 2023-03-27 NOTE — Telephone Encounter (Signed)
 Returned call to pt. No answer. Left msg to call back.

## 2023-03-28 NOTE — Telephone Encounter (Signed)
 Left message to return call

## 2023-03-29 NOTE — Telephone Encounter (Signed)
 Left message on both home and cell numbers

## 2023-04-01 ENCOUNTER — Emergency Department (HOSPITAL_COMMUNITY): Payer: No Typology Code available for payment source

## 2023-04-01 ENCOUNTER — Other Ambulatory Visit: Payer: Self-pay

## 2023-04-01 ENCOUNTER — Inpatient Hospital Stay (HOSPITAL_COMMUNITY)
Admission: EM | Admit: 2023-04-01 | Discharge: 2023-04-05 | DRG: 322 | Disposition: A | Discharge status: Home / Self Care | Payer: No Typology Code available for payment source | Attending: Internal Medicine | Admitting: Internal Medicine

## 2023-04-01 ENCOUNTER — Encounter (HOSPITAL_COMMUNITY): Payer: Self-pay | Admitting: Emergency Medicine

## 2023-04-01 DIAGNOSIS — I214 Non-ST elevation (NSTEMI) myocardial infarction: Principal | ICD-10-CM | POA: Diagnosis present

## 2023-04-01 DIAGNOSIS — E7800 Pure hypercholesterolemia, unspecified: Secondary | ICD-10-CM | POA: Diagnosis present

## 2023-04-01 DIAGNOSIS — I480 Paroxysmal atrial fibrillation: Secondary | ICD-10-CM | POA: Diagnosis present

## 2023-04-01 DIAGNOSIS — Z91041 Radiographic dye allergy status: Secondary | ICD-10-CM

## 2023-04-01 DIAGNOSIS — E1169 Type 2 diabetes mellitus with other specified complication: Secondary | ICD-10-CM

## 2023-04-01 DIAGNOSIS — E1122 Type 2 diabetes mellitus with diabetic chronic kidney disease: Secondary | ICD-10-CM | POA: Diagnosis present

## 2023-04-01 DIAGNOSIS — Z79899 Other long term (current) drug therapy: Secondary | ICD-10-CM | POA: Diagnosis not present

## 2023-04-01 DIAGNOSIS — M549 Dorsalgia, unspecified: Secondary | ICD-10-CM | POA: Diagnosis not present

## 2023-04-01 DIAGNOSIS — I350 Nonrheumatic aortic (valve) stenosis: Secondary | ICD-10-CM | POA: Diagnosis present

## 2023-04-01 DIAGNOSIS — Z6834 Body mass index (BMI) 34.0-34.9, adult: Secondary | ICD-10-CM | POA: Diagnosis not present

## 2023-04-01 DIAGNOSIS — Z7901 Long term (current) use of anticoagulants: Secondary | ICD-10-CM | POA: Diagnosis not present

## 2023-04-01 DIAGNOSIS — D72829 Elevated white blood cell count, unspecified: Secondary | ICD-10-CM | POA: Diagnosis not present

## 2023-04-01 DIAGNOSIS — N1832 Chronic kidney disease, stage 3b: Secondary | ICD-10-CM | POA: Diagnosis not present

## 2023-04-01 DIAGNOSIS — I4901 Ventricular fibrillation: Secondary | ICD-10-CM | POA: Diagnosis present

## 2023-04-01 DIAGNOSIS — I130 Hypertensive heart and chronic kidney disease with heart failure and stage 1 through stage 4 chronic kidney disease, or unspecified chronic kidney disease: Secondary | ICD-10-CM | POA: Diagnosis not present

## 2023-04-01 DIAGNOSIS — R000 Tachycardia, unspecified: Secondary | ICD-10-CM | POA: Diagnosis not present

## 2023-04-01 DIAGNOSIS — R062 Wheezing: Secondary | ICD-10-CM | POA: Diagnosis not present

## 2023-04-01 DIAGNOSIS — R0689 Other abnormalities of breathing: Secondary | ICD-10-CM | POA: Diagnosis not present

## 2023-04-01 DIAGNOSIS — Z8249 Family history of ischemic heart disease and other diseases of the circulatory system: Secondary | ICD-10-CM

## 2023-04-01 DIAGNOSIS — Z90710 Acquired absence of both cervix and uterus: Secondary | ICD-10-CM

## 2023-04-01 DIAGNOSIS — Z8679 Personal history of other diseases of the circulatory system: Secondary | ICD-10-CM

## 2023-04-01 DIAGNOSIS — I959 Hypotension, unspecified: Secondary | ICD-10-CM | POA: Diagnosis present

## 2023-04-01 DIAGNOSIS — N183 Chronic kidney disease, stage 3 unspecified: Secondary | ICD-10-CM | POA: Diagnosis present

## 2023-04-01 DIAGNOSIS — E119 Type 2 diabetes mellitus without complications: Secondary | ICD-10-CM

## 2023-04-01 DIAGNOSIS — I4892 Unspecified atrial flutter: Secondary | ICD-10-CM | POA: Diagnosis not present

## 2023-04-01 DIAGNOSIS — I482 Chronic atrial fibrillation, unspecified: Secondary | ICD-10-CM | POA: Diagnosis present

## 2023-04-01 DIAGNOSIS — R531 Weakness: Secondary | ICD-10-CM | POA: Diagnosis not present

## 2023-04-01 DIAGNOSIS — R61 Generalized hyperhidrosis: Secondary | ICD-10-CM | POA: Diagnosis not present

## 2023-04-01 DIAGNOSIS — Z888 Allergy status to other drugs, medicaments and biological substances status: Secondary | ICD-10-CM

## 2023-04-01 DIAGNOSIS — N184 Chronic kidney disease, stage 4 (severe): Secondary | ICD-10-CM | POA: Diagnosis present

## 2023-04-01 DIAGNOSIS — I2510 Atherosclerotic heart disease of native coronary artery without angina pectoris: Secondary | ICD-10-CM | POA: Diagnosis not present

## 2023-04-01 DIAGNOSIS — I499 Cardiac arrhythmia, unspecified: Secondary | ICD-10-CM | POA: Diagnosis not present

## 2023-04-01 DIAGNOSIS — I5042 Chronic combined systolic (congestive) and diastolic (congestive) heart failure: Secondary | ICD-10-CM | POA: Diagnosis present

## 2023-04-01 DIAGNOSIS — Z885 Allergy status to narcotic agent status: Secondary | ICD-10-CM

## 2023-04-01 DIAGNOSIS — G8929 Other chronic pain: Secondary | ICD-10-CM | POA: Diagnosis not present

## 2023-04-01 DIAGNOSIS — I5021 Acute systolic (congestive) heart failure: Secondary | ICD-10-CM | POA: Diagnosis not present

## 2023-04-01 DIAGNOSIS — J45909 Unspecified asthma, uncomplicated: Secondary | ICD-10-CM | POA: Diagnosis not present

## 2023-04-01 DIAGNOSIS — I10 Essential (primary) hypertension: Secondary | ICD-10-CM | POA: Diagnosis not present

## 2023-04-01 DIAGNOSIS — Z87891 Personal history of nicotine dependence: Secondary | ICD-10-CM

## 2023-04-01 DIAGNOSIS — I249 Acute ischemic heart disease, unspecified: Secondary | ICD-10-CM | POA: Diagnosis present

## 2023-04-01 DIAGNOSIS — Z91013 Allergy to seafood: Secondary | ICD-10-CM

## 2023-04-01 DIAGNOSIS — I255 Ischemic cardiomyopathy: Secondary | ICD-10-CM | POA: Diagnosis not present

## 2023-04-01 DIAGNOSIS — I471 Supraventricular tachycardia, unspecified: Secondary | ICD-10-CM | POA: Diagnosis not present

## 2023-04-01 DIAGNOSIS — J9602 Acute respiratory failure with hypercapnia: Secondary | ICD-10-CM | POA: Diagnosis present

## 2023-04-01 DIAGNOSIS — I4891 Unspecified atrial fibrillation: Secondary | ICD-10-CM | POA: Diagnosis present

## 2023-04-01 DIAGNOSIS — I5022 Chronic systolic (congestive) heart failure: Secondary | ICD-10-CM | POA: Diagnosis not present

## 2023-04-01 DIAGNOSIS — Z882 Allergy status to sulfonamides status: Secondary | ICD-10-CM

## 2023-04-01 DIAGNOSIS — R42 Dizziness and giddiness: Secondary | ICD-10-CM | POA: Diagnosis not present

## 2023-04-01 DIAGNOSIS — I252 Old myocardial infarction: Secondary | ICD-10-CM

## 2023-04-01 DIAGNOSIS — Z955 Presence of coronary angioplasty implant and graft: Secondary | ICD-10-CM | POA: Diagnosis not present

## 2023-04-01 DIAGNOSIS — Z95810 Presence of automatic (implantable) cardiac defibrillator: Secondary | ICD-10-CM | POA: Diagnosis not present

## 2023-04-01 DIAGNOSIS — E669 Obesity, unspecified: Secondary | ICD-10-CM | POA: Diagnosis present

## 2023-04-01 DIAGNOSIS — Z880 Allergy status to penicillin: Secondary | ICD-10-CM

## 2023-04-01 DIAGNOSIS — I200 Unstable angina: Secondary | ICD-10-CM | POA: Diagnosis present

## 2023-04-01 LAB — BASIC METABOLIC PANEL
Anion gap: 10 (ref 5–15)
BUN: 26 mg/dL — ABNORMAL HIGH (ref 8–23)
CO2: 24 mmol/L (ref 22–32)
Calcium: 9.5 mg/dL (ref 8.9–10.3)
Chloride: 100 mmol/L (ref 98–111)
Creatinine, Ser: 1.82 mg/dL — ABNORMAL HIGH (ref 0.44–1.00)
GFR, Estimated: 28 mL/min — ABNORMAL LOW (ref >60)
Glucose, Bld: 163 mg/dL — ABNORMAL HIGH (ref 70–99)
Potassium: 3.9 mmol/L (ref 3.5–5.1)
Sodium: 134 mmol/L — ABNORMAL LOW (ref 135–145)

## 2023-04-01 LAB — CBC
HCT: 37.5 % (ref 36.0–46.0)
Hemoglobin: 12.4 g/dL (ref 12.0–15.0)
MCH: 32.4 pg (ref 26.0–34.0)
MCHC: 33.1 g/dL (ref 30.0–36.0)
MCV: 97.9 fL (ref 80.0–100.0)
Platelets: 200 10*3/uL (ref 150–400)
RBC: 3.83 MIL/uL — ABNORMAL LOW (ref 3.87–5.11)
RDW: 13.1 % (ref 11.5–15.5)
WBC: 10.7 10*3/uL — ABNORMAL HIGH (ref 4.0–10.5)
nRBC: 0 % (ref 0.0–0.2)

## 2023-04-01 LAB — DIGOXIN LEVEL: Digoxin Level: <0.2 ng/mL — ABNORMAL LOW (ref 0.8–2.0)

## 2023-04-01 LAB — TSH: TSH: 3.315 u[IU]/mL (ref 0.350–4.500)

## 2023-04-01 LAB — TROPONIN I (HIGH SENSITIVITY)
Troponin I (High Sensitivity): 196 ng/L (ref <18)
Troponin I (High Sensitivity): 2319 ng/L (ref <18)

## 2023-04-01 LAB — BRAIN NATRIURETIC PEPTIDE: B Natriuretic Peptide: 455 pg/mL — ABNORMAL HIGH (ref 0.0–100.0)

## 2023-04-01 LAB — MAGNESIUM: Magnesium: 2.1 mg/dL (ref 1.7–2.4)

## 2023-04-01 MED ORDER — METOPROLOL TARTRATE 5 MG/5ML IV SOLN
2.5000 mg | INTRAVENOUS | Status: DC | PRN
  Administered 2023-04-01 (×2): 2.5 mg via INTRAVENOUS
  Filled 2023-04-01: qty 5

## 2023-04-01 MED ORDER — ONDANSETRON HCL 4 MG/2ML IJ SOLN: 4.0000 mg | Freq: Four times a day (QID) | INTRAMUSCULAR | Status: DC | PRN

## 2023-04-01 MED ORDER — HEPARIN (PORCINE) 25000 UT/250ML-% IV SOLN
1100.0000 [IU]/h | INTRAVENOUS | Status: DC
  Administered 2023-04-01 – 2023-04-03 (×2): 850 [IU]/h via INTRAVENOUS
  Filled 2023-04-01 (×2): qty 250

## 2023-04-01 MED ORDER — ACETAMINOPHEN 325 MG PO TABS: 650.0000 mg | ORAL_TABLET | ORAL | Status: DC | PRN

## 2023-04-01 MED ORDER — ASPIRIN 81 MG PO CHEW
324.0000 mg | CHEWABLE_TABLET | Freq: Once | ORAL | Status: AC
  Administered 2023-04-01: 324 mg via ORAL
  Filled 2023-04-01: qty 4

## 2023-04-01 NOTE — Progress Notes (Signed)
 PHARMACY - ANTICOAGULATION CONSULT NOTE  Pharmacy Consult for IV heparin   Indication: chest pain/ACS  Allergies  Allergen Reactions   Iodinated Contrast Media Shortness Of Breath    Per Patient- The last time she had contrast during a scan she had difficulty breathing   Codeine Hives   Shrimp [Shellfish Allergy] Hives and Other (See Comments)    Broke out   Amiodarone  Nausea And Vomiting   Penicillins Itching and Rash    swelling of the face/tongue/throat, SOB, or low BP sudden or severe rash/hives, skin peeling, or any reaction on the inside of your mouth or nose    Sulfa Antibiotics Itching    Patient Measurements: Height: 5' 2 (157.5 cm) Weight: 85 kg (187 lb 6.3 oz) IBW/kg (Calculated) : 50.1 Heparin  Dosing Weight: 69.3 kg  Vital Signs: Temp: 98.3 F (36.8 C) (01/13 1840) Temp Source: Oral (01/13 1840) BP: 119/66 (01/13 2145) Pulse Rate: 92 (01/13 2145)  Labs: Recent Labs    04/01/23 1920 04/01/23 2119  HGB 12.4  --   HCT 37.5  --   PLT 200  --   CREATININE 1.82*  --   TROPONINIHS 196* 2,319*    Estimated Creatinine Clearance: 24.5 mL/min (A) (by C-G formula based on SCr of 1.82 mg/dL (H)).  Medical History: Past Medical History:  Diagnosis Date   Arthritis    Asthma    Chronic HFrEF (heart failure with reduced ejection fraction) (HCC)    Chronic kidney disease, stage 3b (HCC)    Diabetes mellitus (HCC)    Hypercholesterolemia    Hyperlipidemia    Ischemic cardiomyopathy    STEMI (ST elevation myocardial infarction) (HCC)    a. 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 year old female admitted on 04/01/2023 with concern for ACS. Eliquis  prior to admission (last dose 1/13 AM). Pharmacy consulted to dose heparin .  Goal of Therapy:  Heparin  level 0.3-0.7 units/ml Monitor platelets by anticoagulation protocol: Yes   Plan:  Heparin  infusion at 850 units/hr (no bolus) 8 aPTT  Daily aPTT, heparin  level,  CBC, and monitoring for bleeding F/u plans for anticoagulation and cards recs  Thank you for allowing pharmacy to participate in this patient's care.  Leonor GORMAN Bash, PharmD Emergency Medicine Clinical Pharmacist 04/01/2023,10:21 PM

## 2023-04-01 NOTE — ED Triage Notes (Signed)
 Pt felt an all over weakness all of a sudden with dizziness. Ems states she was in svt at 185 and she converted to a fib after moving to stretcher.

## 2023-04-01 NOTE — ED Provider Notes (Signed)
 [place] EMERGENCY DEPARTMENT AT [facility] Provider Note   CSN: [id number] Arrival date & time: 04/01/23  1833     History  Chief Complaint  Patient presents with   Weakness    Terry Hanson is a 83 y.o. female.  83 year old female with a history of ventricular tachycardia status post [facility] Jude ICD, heart failure with reduced ejection fraction (20 to 25%), diabetes, hypertension, hyperlipidemia, atrial fibrillation on Eliquis  and digoxin , and NSTEMI status post PCI who presents emergency department generalized weakness.  Patient reports that today at approximately 5 PM she had a sensation of generalized weakness.  Says that her right arm also started hurting at that time.  No palpitations or chest pain.  Called 911 because she started sweating and when EMS arrived they found that she was in an SVT going approximately 185 beats per minutes.  Then converted to atrial fibrillation spontaneously and has been in A-fib with RVR since.       Home Medications Prior to Admission medications   Medication Sig Start Date End Date Taking? Authorizing Provider  acetaminophen  (TYLENOL ) 325 MG tablet Take 2 tablets (650 mg total) by mouth 3 (three) times daily. 01/10/23  Yes Johnson, Clanford L, MD  apixaban  (ELIQUIS ) 2.5 MG TABS tablet Take 1 tablet (2.5 mg total) by mouth 2 (two) times daily. 02/02/23  Yes Delo, Vicenta, MD  Digoxin  62.5 MCG TABS Take 0.0625 mg by mouth 3 (three) times a week. Taking Monday, Wednesday, and Friday. 03/25/23  Yes Mallipeddi, Vishnu P, MD  furosemide  (LASIX ) 20 MG tablet Take 1 tablet (20 mg total) by mouth daily. 02/05/23  Yes Mallipeddi, Vishnu P, MD  lidocaine  (LIDODERM ) 5 % Place 1 patch onto the skin daily as needed (back pain). Remove & Discard patch within 12 hours or as directed by MD 02/02/23  Yes Delo, Vicenta, MD  metoprolol  succinate (TOPROL -XL) 50 MG 24 hr tablet TAKE 1 TABLET(50 MG) BY MOUTH AT BEDTIME 02/05/23  Yes Mallipeddi, Vishnu P, MD   nystatin  (MYCOSTATIN /NYSTOP ) powder Apply topically 3 (three) times daily. 12/09/22  Yes Lue Elsie BROCKS, MD  pantoprazole  (PROTONIX ) 40 MG tablet Take 1 tablet (40 mg total) by mouth daily. 02/02/23 02/02/24 Yes Delo, Vicenta, MD  simvastatin  (ZOCOR ) 20 MG tablet TAKE 1 TABLET(20 MG) BY MOUTH EVERY EVENING 02/02/23  Yes Delo, Vicenta, MD  spironolactone  (ALDACTONE ) 25 MG tablet Take 1 tablet (25 mg total) by mouth at bedtime. 02/05/23  Yes Mallipeddi, Vishnu P, MD      Allergies    Iodinated contrast media, Codeine, Shrimp [shellfish allergy], Amiodarone , Penicillins, and Sulfa antibiotics    Review of Systems   Review of Systems  Physical Exam Updated Vital Signs BP 120/65   Pulse (!) 102   Temp 98.1 F (36.7 C)   Resp 19   Ht 5' 2 (1.575 m)   Wt 85 kg   SpO2 95%   BMI 34.27 kg/m  Physical Exam Vitals and nursing note reviewed.  Constitutional:      General: She is not in acute distress.    Appearance: She is well-developed.  HENT:     Head: Normocephalic and atraumatic.     Right Ear: External ear normal.     Left Ear: External ear normal.     Nose: Nose normal.  Eyes:     Extraocular Movements: Extraocular movements intact.     Conjunctiva/sclera: Conjunctivae normal.     Pupils: Pupils are equal, round, and reactive to light.  Cardiovascular:  Rate and Rhythm: Tachycardia present. Rhythm irregular.     Heart sounds: No murmur heard. Pulmonary:     Effort: Pulmonary effort is normal. No respiratory distress.     Breath sounds: Normal breath sounds.  Musculoskeletal:     Cervical back: Normal range of motion and neck supple.     Right lower leg: No edema.     Left lower leg: No edema.  Skin:    General: Skin is warm and dry.  Neurological:     Mental Status: She is alert and oriented to person, place, and time. Mental status is at baseline.  Psychiatric:        Mood and Affect: Mood normal.     ED Results / Procedures / Treatments   Labs (all  labs ordered are listed, but only abnormal results are displayed) Labs Reviewed  BASIC METABOLIC PANEL - Abnormal; Notable for the following components:      Result Value   Sodium 134 (*)    Glucose, Bld 163 (*)    BUN 26 (*)    Creatinine, Ser 1.82 (*)    GFR, Estimated 28 (*)    All other components within normal limits  CBC - Abnormal; Notable for the following components:   WBC 10.7 (*)    RBC 3.83 (*)    All other components within normal limits  BRAIN NATRIURETIC PEPTIDE - Abnormal; Notable for the following components:   B Natriuretic Peptide 455.0 (*)    All other components within normal limits  DIGOXIN  LEVEL - Abnormal; Notable for the following components:   Digoxin  Level <0.2 (*)    All other components within normal limits  APTT - Abnormal; Notable for the following components:   aPTT 70 (*)    All other components within normal limits  HEPARIN  LEVEL (UNFRACTIONATED) - Abnormal; Notable for the following components:   Heparin  Unfractionated >1.10 (*)    All other components within normal limits  CBC - Abnormal; Notable for the following components:   RBC 3.79 (*)    All other components within normal limits  COMPREHENSIVE METABOLIC PANEL - Abnormal; Notable for the following components:   Sodium 134 (*)    Glucose, Bld 140 (*)    BUN 25 (*)    Creatinine, Ser 1.67 (*)    AST 52 (*)    Alkaline Phosphatase 29 (*)    GFR, Estimated 31 (*)    All other components within normal limits  BRAIN NATRIURETIC PEPTIDE - Abnormal; Notable for the following components:   B Natriuretic Peptide 639.0 (*)    All other components within normal limits  CBC WITH DIFFERENTIAL/PLATELET - Abnormal; Notable for the following components:   WBC 12.3 (*)    RBC 3.78 (*)    Neutro Abs 9.7 (*)    All other components within normal limits  APTT - Abnormal; Notable for the following components:   aPTT 81 (*)    All other components within normal limits  TROPONIN I (HIGH SENSITIVITY) -  Abnormal; Notable for the following components:   Troponin I (High Sensitivity) 196 (*)    All other components within normal limits  TROPONIN I (HIGH SENSITIVITY) - Abnormal; Notable for the following components:   Troponin I (High Sensitivity) 2,319 (*)    All other components within normal limits  TROPONIN I (HIGH SENSITIVITY) - Abnormal; Notable for the following components:   Troponin I (High Sensitivity) 11,376 (*)    All other components within normal limits  MAGNESIUM   TSH  MAGNESIUM   HEPARIN  LEVEL (UNFRACTIONATED)  APTT  CBC  LIPOPROTEIN A (LPA)  BASIC METABOLIC PANEL    EKG EKG Interpretation Date/Time:  Monday April 01 2023 20:52:53 EST Ventricular Rate:  93 PR Interval:  158 QRS Duration:  100 QT Interval:  384 QTC Calculation: 478 R Axis:   55  Text Interpretation: Sinus rhythm Consider anterior infarct Repol abnrm suggests ischemia, diffuse leads Confirmed by Theadore Sharper [phone]) on 04/01/2023 11:39:26 PM  Radiology DG Chest Port 1 View Result Date: 04/01/2023 CLINICAL DATA:  Dizziness and weakness. EXAM: PORTABLE CHEST 1 VIEW COMPARISON:  February 02, 2023 FINDINGS: There is stable single lead ventricular pacer positioning. The cardiac silhouette is mildly enlarged and unchanged in size. There is moderate severity calcification of the aortic arch. Both lungs are clear. Multilevel degenerative changes seen throughout the thoracic spine. IMPRESSION: No active cardiopulmonary disease. Electronically Signed   By: Suzen Dials M.D.   On: 04/01/2023 20:36    Procedures Procedures    Medications Ordered in ED Medications  metoprolol  tartrate (LOPRESSOR ) injection 2.5 mg (2.5 mg Intravenous Given 04/01/23 1959)  heparin  ADULT infusion 100 units/mL (25000 units/250mL) (850 Units/hr Intravenous New Bag/Given 04/01/23 2312)  ondansetron  (ZOFRAN ) injection 4 mg (has no administration in time range)  digoxin  (LANOXIN ) tablet 0.0625 mg (has no administration in time  range)  metoprolol  succinate (TOPROL -XL) 24 hr tablet 50 mg (50 mg Oral Given 04/02/23 0931)  pantoprazole  (PROTONIX ) EC tablet 40 mg (40 mg Oral Given 04/02/23 1929)  aspirin  EC tablet 81 mg (has no administration in time range)  nitroGLYCERIN  (NITROSTAT ) SL tablet 0.4 mg (has no administration in time range)  acetaminophen  (TYLENOL ) tablet 650 mg (has no administration in time range)  rosuvastatin  (CRESTOR ) tablet 20 mg (20 mg Oral Given 04/02/23 0930)  aspirin  chewable tablet 324 mg (324 mg Oral Given 04/01/23 2132)    ED Course/ Medical Decision Making/ A&P Clinical Course as of 04/02/23 2330  Mon Apr 01, 2023  1945 Creatinine(!): 1.82 Baseline 2.5 [RP]  2046 Dr Vonda recommends 12.5mg  q6h of metop tartrate and can increase as tolerated.  [RP]  2215 Cardiology repaged.  They will admit to [facility]. [RP]    Clinical Course User Index [RP] Yolande Lamar BROCKS, MD                                 Medical Decision Making Amount and/or Complexity of Data Reviewed Labs: ordered. Decision-making details documented in ED Course. Radiology: ordered.  Risk OTC drugs. Prescription drug management. Decision regarding hospitalization.   Terry Hanson is a 83 y.o. female with comorbidities that complicate the patient evaluation including  ventricular tachycardia status post [facility] Jude ICD, heart failure with reduced ejection fraction (20 to 25%), diabetes, hypertension, hyperlipidemia, atrial fibrillation on Eliquis  and digoxin , and NSTEMI status post PCI who presents emergency department generalized weakness.   Initial Ddx:  SVT, arrhythmia, MI, PE  MDM/Course:  Patient presents emergency department with generalized weakness.  Also an episode of diaphoresis and right arm pain.  On exam is not in acute distress.  EMS does report that she appeared to be in a supraventricular tachycardia but I did not have the strip for review.  Was in A-fib with RVR upon my initial evaluation.  Was  given 2 doses of metoprolol  with improvement back to sinus rhythm.  Will still having some arm pain so troponin was  sent.  The initial value was mildly elevated at 196.  Repeat troponin came back at 2300.  While this could potentially been demand that increase was very concerning for ACS as the cause of her symptoms.  Was given aspirin  and started on heparin .  Was discussed with cardiology who will admit the patient for an NSTEMI.  This patient presents to the ED for concern of complaints listed in HPI, this involves an extensive number of treatment options, and is a complaint that carries with it a high risk of complications and morbidity. Disposition including potential need for admission considered.   Dispo: Admit to Floor  Records reviewed Outpatient Clinic Notes The following labs were independently interpreted: Serial Troponins and show  NSTEMI I independently reviewed the following imaging with scope of interpretation limited to determining acute life threatening conditions related to emergency care: Chest x-ray and agree with the radiologist interpretation with the following exceptions: none I personally reviewed and interpreted cardiac monitoring: normal sinus rhythm  and atrial fibrillation with RVR I personally reviewed and interpreted the pt's EKG: see above for interpretation  I have reviewed the patients home medications and made adjustments as needed Consults: Cardiology  Portions of this note were generated with Dragon dictation software. Dictation errors may occur despite best attempts at proofreading.    CRITICAL CARE Performed by: Lamar JAYSON Shan   Total critical care time: 30 minutes  Critical care time was exclusive of separately billable procedures and treating other patients.  Critical care was necessary to treat or prevent imminent or life-threatening deterioration.  Critical care was time spent personally by me on the following activities: development of  treatment plan with patient and/or surrogate as well as nursing, discussions with consultants, evaluation of patient's response to treatment, examination of patient, obtaining history from patient or surrogate, ordering and performing treatments and interventions, ordering and review of laboratory studies, ordering and review of radiographic studies, pulse oximetry and re-evaluation of patient's condition.  Final Clinical Impression(s) / ED Diagnoses Final diagnoses:  NSTEMI (non-ST elevated myocardial infarction) [facility])  Atrial fibrillation with RVR [facility])    Rx / DC Orders ED Discharge Orders          Ordered    Amb referral to AFIB Clinic        04/01/23 1906              Shan Lamar JAYSON, MD 04/02/23 [phone]

## 2023-04-02 DIAGNOSIS — I5022 Chronic systolic (congestive) heart failure: Secondary | ICD-10-CM

## 2023-04-02 DIAGNOSIS — I214 Non-ST elevation (NSTEMI) myocardial infarction: Secondary | ICD-10-CM | POA: Diagnosis not present

## 2023-04-02 DIAGNOSIS — I480 Paroxysmal atrial fibrillation: Secondary | ICD-10-CM | POA: Diagnosis not present

## 2023-04-02 LAB — CBC
HCT: 36.7 % (ref 36.0–46.0)
Hemoglobin: 12 g/dL (ref 12.0–15.0)
MCH: 31.7 pg (ref 26.0–34.0)
MCHC: 32.7 g/dL (ref 30.0–36.0)
MCV: 96.8 fL (ref 80.0–100.0)
Platelets: 201 10*3/uL (ref 150–400)
RBC: 3.79 MIL/uL — ABNORMAL LOW (ref 3.87–5.11)
RDW: 13 % (ref 11.5–15.5)
WBC: 9.1 10*3/uL (ref 4.0–10.5)
nRBC: 0 % (ref 0.0–0.2)

## 2023-04-02 LAB — CBC WITH DIFFERENTIAL/PLATELET
Abs Immature Granulocytes: 0.06 10*3/uL (ref 0.00–0.07)
Basophils Absolute: 0.1 10*3/uL (ref 0.0–0.1)
Basophils Relative: 0 %
Eosinophils Absolute: 0.1 10*3/uL (ref 0.0–0.5)
Eosinophils Relative: 1 %
HCT: 36.8 % (ref 36.0–46.0)
Hemoglobin: 12.1 g/dL (ref 12.0–15.0)
Immature Granulocytes: 1 %
Lymphocytes Relative: 16 %
Lymphs Abs: 1.9 10*3/uL (ref 0.7–4.0)
MCH: 32 pg (ref 26.0–34.0)
MCHC: 32.9 g/dL (ref 30.0–36.0)
MCV: 97.4 fL (ref 80.0–100.0)
Monocytes Absolute: 0.6 10*3/uL (ref 0.1–1.0)
Monocytes Relative: 5 %
Neutro Abs: 9.7 10*3/uL — ABNORMAL HIGH (ref 1.7–7.7)
Neutrophils Relative %: 77 %
Platelets: 199 10*3/uL (ref 150–400)
RBC: 3.78 MIL/uL — ABNORMAL LOW (ref 3.87–5.11)
RDW: 12.9 % (ref 11.5–15.5)
WBC: 12.3 10*3/uL — ABNORMAL HIGH (ref 4.0–10.5)
nRBC: 0 % (ref 0.0–0.2)

## 2023-04-02 LAB — COMPREHENSIVE METABOLIC PANEL
ALT: 13 U/L (ref 0–44)
AST: 52 U/L — ABNORMAL HIGH (ref 15–41)
Albumin: 3.6 g/dL (ref 3.5–5.0)
Alkaline Phosphatase: 29 U/L — ABNORMAL LOW (ref 38–126)
Anion gap: 10 (ref 5–15)
BUN: 25 mg/dL — ABNORMAL HIGH (ref 8–23)
CO2: 22 mmol/L (ref 22–32)
Calcium: 9.4 mg/dL (ref 8.9–10.3)
Chloride: 102 mmol/L (ref 98–111)
Creatinine, Ser: 1.67 mg/dL — ABNORMAL HIGH (ref 0.44–1.00)
GFR, Estimated: 31 mL/min — ABNORMAL LOW (ref >60)
Glucose, Bld: 140 mg/dL — ABNORMAL HIGH (ref 70–99)
Potassium: 4 mmol/L (ref 3.5–5.1)
Sodium: 134 mmol/L — ABNORMAL LOW (ref 135–145)
Total Bilirubin: 1 mg/dL (ref 0.0–1.2)
Total Protein: 6.9 g/dL (ref 6.5–8.1)

## 2023-04-02 LAB — APTT
aPTT: 70 s — ABNORMAL HIGH (ref 24–36)
aPTT: 81 s — ABNORMAL HIGH (ref 24–36)

## 2023-04-02 LAB — BRAIN NATRIURETIC PEPTIDE: B Natriuretic Peptide: 639 pg/mL — ABNORMAL HIGH (ref 0.0–100.0)

## 2023-04-02 LAB — TROPONIN I (HIGH SENSITIVITY): Troponin I (High Sensitivity): 11376 ng/L (ref <18)

## 2023-04-02 LAB — MAGNESIUM: Magnesium: 2.3 mg/dL (ref 1.7–2.4)

## 2023-04-02 LAB — HEPARIN LEVEL (UNFRACTIONATED): Heparin Unfractionated: >1.1 [IU]/mL — ABNORMAL HIGH (ref 0.30–0.70)

## 2023-04-02 MED ORDER — NITROGLYCERIN 0.4 MG SL SUBL: 0.4000 mg | SUBLINGUAL_TABLET | SUBLINGUAL | Status: DC | PRN

## 2023-04-02 MED ORDER — ROSUVASTATIN CALCIUM 20 MG PO TABS
20.0000 mg | ORAL_TABLET | Freq: Every day | ORAL | Status: DC
  Administered 2023-04-02 – 2023-04-05 (×4): 20 mg via ORAL
  Filled 2023-04-02 (×4): qty 1

## 2023-04-02 MED ORDER — METOPROLOL SUCCINATE ER 50 MG PO TB24
50.0000 mg | ORAL_TABLET | Freq: Every day | ORAL | Status: DC
  Administered 2023-04-02 – 2023-04-05 (×4): 50 mg via ORAL
  Filled 2023-04-02 (×4): qty 1

## 2023-04-02 MED ORDER — GUAIFENESIN-DM 100-10 MG/5ML PO SYRP
15.0000 mL | ORAL_SOLUTION | ORAL | Status: DC | PRN
  Administered 2023-04-03 – 2023-04-04 (×4): 15 mL via ORAL
  Filled 2023-04-02 (×4): qty 15

## 2023-04-02 MED ORDER — ASPIRIN 81 MG PO TBEC
81.0000 mg | DELAYED_RELEASE_TABLET | Freq: Every day | ORAL | Status: DC
  Administered 2023-04-03 – 2023-04-05 (×2): 81 mg via ORAL
  Filled 2023-04-02 (×2): qty 1

## 2023-04-02 MED ORDER — PANTOPRAZOLE SODIUM 40 MG PO TBEC
40.0000 mg | DELAYED_RELEASE_TABLET | Freq: Every day | ORAL | Status: DC
  Administered 2023-04-02 – 2023-04-05 (×4): 40 mg via ORAL
  Filled 2023-04-02 (×4): qty 1

## 2023-04-02 MED ORDER — ACETAMINOPHEN 325 MG PO TABS
650.0000 mg | ORAL_TABLET | ORAL | Status: DC | PRN
  Administered 2023-04-03: 650 mg via ORAL
  Filled 2023-04-02: qty 2

## 2023-04-02 MED ORDER — DIGOXIN 125 MCG PO TABS
0.0625 mg | ORAL_TABLET | ORAL | Status: DC
  Administered 2023-04-03 – 2023-04-05 (×2): 0.0625 mg via ORAL
  Filled 2023-04-02 (×2): qty 1

## 2023-04-02 NOTE — ED Notes (Signed)
 ED TO INPATIENT HANDOFF REPORT  ED Nurse Name and Phone #: [phone] [name] Name/Age/Gender Terry Hanson 83 y.o. female Room/Bed: APA05/APA05  Code Status   Code Status: Full Code  Home/SNF/Other She lives at home, but there is a possibility she may need rehab after this admission  Patient oriented to: self, place, time, and situation Is this baseline? Yes   Triage Complete: Triage complete  Chief Complaint NSTEMI (non-ST elevated myocardial infarction) [facility]) [I21.4]  Triage Note Pt felt an all over weakness all of a sudden with dizziness. Ems states she was in svt at 185 and she converted to a fib after moving to stretcher.    Allergies Allergies  Allergen Reactions   Iodinated Contrast Media Shortness Of Breath    Per Patient- The last time she had contrast during a scan she had difficulty breathing   Codeine Hives   Shrimp [Shellfish Allergy] Hives and Other (See Comments)    Broke out   Amiodarone  Nausea And Vomiting   Penicillins Itching and Rash    swelling of the face/tongue/throat, SOB, or low BP sudden or severe rash/hives, skin peeling, or any reaction on the inside of your mouth or nose    Sulfa Antibiotics Itching    Level of Care/Admitting Diagnosis ED Disposition     ED Disposition  Admit   Condition  --   Comment  Hospital Area: [facility] [[id number]]  Level of Care: Telemetry Cardiac [[id number]]  May admit patient to [facility] or [facility] if equivalent level of care is available:: No  Interfacility transfer: Yes  Covid Evaluation: Asymptomatic - no recent exposure (last 10 days) testing not required  Diagnosis: NSTEMI (non-ST elevated myocardial infarction) [facility]) [[id number]]  Admitting Physician: DEBERA JAYSON MATSU [[id number]]  Attending Physician: DEBERA JAYSON MATSU [[id number]]  Certification:: I certify this patient will need inpatient services for at least 2 midnights          B Medical/Surgery History Past Medical History:   Diagnosis Date   Arthritis    Asthma    Chronic HFrEF (heart failure with reduced ejection fraction) (HCC)    Chronic kidney disease, stage 3b (HCC)    Diabetes mellitus (HCC)    Hypercholesterolemia    Hyperlipidemia    Ischemic cardiomyopathy    STEMI (ST elevation myocardial infarction) (HCC)    a. 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   Past Surgical History:  Procedure Laterality Date   ABDOMINAL HYSTERECTOMY     ATRIAL FIBRILLATION ABLATION N/A 04/05/2021   Procedure: ATRIAL FIBRILLATION ABLATION;  Surgeon: Inocencio Soyla Lunger, MD;  Location: [facility] INVASIVE CV LAB;  Service: Cardiovascular;  Laterality: N/A;   CATARACT EXTRACTION W/PHACO Right 06/27/2020   Procedure: CATARACT EXTRACTION PHACO AND INTRAOCULAR LENS PLACEMENT RIGHT EYE AND PLACEMENT OF CORTICOSTEROID;  Surgeon: Harrie Agent, MD;  Location: [facility] ORS;  Service: Ophthalmology;  Laterality: Right;  right CDE=25.47   CORONARY/GRAFT ACUTE MI REVASCULARIZATION N/A 06/29/2019   Procedure: Coronary/Graft Acute MI Revascularization;  Surgeon: Claudene Victory ORN, MD;  Location: [facility] INVASIVE CV LAB;  Service: Cardiovascular;  Laterality: N/A;   ICD IMPLANT N/A 12/14/2019   Procedure: ICD IMPLANT;  Surgeon: Waddell Danelle ORN, MD;  Location: [facility] INVASIVE CV LAB;  Service: Cardiovascular;  Laterality: N/A;   LEFT HEART CATH AND CORONARY ANGIOGRAPHY N/A 06/29/2019   Procedure: LEFT HEART CATH AND CORONARY ANGIOGRAPHY;  Surgeon: Claudene Victory ORN, MD;  Location: [facility] INVASIVE CV LAB;  Service: Cardiovascular;  Laterality: N/A;  RIGHT HEART CATH N/A 12/30/2020   Procedure: RIGHT HEART CATH;  Surgeon: Rolan Ezra RAMAN, MD;  Location: [facility] INVASIVE CV LAB;  Service: Cardiovascular;  Laterality: N/A;   TEE WITHOUT CARDIOVERSION N/A 04/04/2021   Procedure: TRANSESOPHAGEAL ECHOCARDIOGRAM (TEE);  Surgeon: Jeffrie Oneil BROCKS, MD;  Location: [facility] ENDOSCOPY;  Service: Cardiovascular;  Laterality: N/A;     A IV Location/Drains/Wounds Patient  Lines/Drains/Airways Status     Active Line/Drains/Airways     Name Placement date Placement time Site Days   Peripheral IV 04/01/23 20 G Right Antecubital 04/01/23  1844  Antecubital  1   External Urinary Catheter 04/01/23  2040  --  1            Intake/Output Last 24 hours No intake or output data in the 24 hours ending 04/02/23 1552  Labs/Imaging Results for orders placed or performed during the hospital encounter of 04/01/23 (from the past 48 hours)  Basic metabolic panel     Status: Abnormal   Collection Time: 04/01/23  7:20 PM  Result Value Ref Range   Sodium 134 (L) 135 - 145 mmol/L   Potassium 3.9 3.5 - 5.1 mmol/L   Chloride 100 98 - 111 mmol/L   CO2 24 22 - 32 mmol/L   Glucose, Bld 163 (H) 70 - 99 mg/dL    Comment: Glucose reference range applies only to samples taken after fasting for at least 8 hours.   BUN 26 (H) 8 - 23 mg/dL   Creatinine, Ser 8.17 (H) 0.44 - 1.00 mg/dL   Calcium  9.5 8.9 - 10.3 mg/dL   GFR, Estimated 28 (L) >60 mL/min    Comment: (NOTE) Calculated using the CKD-EPI Creatinine Equation (2021)    Anion gap 10 5 - 15    Comment: Performed at [facility], [address], [place], [place] [address]  Magnesium      Status: None   Collection Time: 04/01/23  7:20 PM  Result Value Ref Range   Magnesium  2.1 1.7 - 2.4 mg/dL    Comment: Performed at [facility], [address], [place], [place] [address]  CBC     Status: Abnormal   Collection Time: 04/01/23  7:20 PM  Result Value Ref Range   WBC 10.7 (H) 4.0 - 10.5 K/uL   RBC 3.83 (L) 3.87 - 5.11 MIL/uL   Hemoglobin 12.4 12.0 - 15.0 g/dL   HCT 62.4 63.9 - 53.9 %   MCV 97.9 80.0 - 100.0 fL   MCH 32.4 26.0 - 34.0 pg   MCHC 33.1 30.0 - 36.0 g/dL   RDW 86.8 88.4 - 84.4 %   Platelets 200 150 - 400 K/uL   nRBC 0.0 0.0 - 0.2 %    Comment: Performed at [facility], [address], [place], [place] [address]  TSH     Status: None   Collection Time: 04/01/23  7:20 PM  Result Value Ref Range   TSH 3.315  0.350 - 4.500 uIU/mL    Comment: Performed by a 3rd Generation assay with a functional sensitivity of <=0.01 uIU/mL. Performed at [facility], [address] [place], [place] [address]   Brain natriuretic peptide (IF shortness of breath has been documented this visit)     Status: Abnormal   Collection Time: 04/01/23  7:20 PM  Result Value Ref Range   B Natriuretic Peptide 455.0 (H) 0.0 - 100.0 pg/mL    Comment: Performed at [facility], [address], [place], [place] [address]  Troponin I (High Sensitivity)  Status: Abnormal   Collection Time: 04/01/23  7:20 PM  Result Value Ref Range   Troponin I (High Sensitivity) 196 (HH) <18 ng/L    Comment: CRITICAL RESULT CALLED TO, READ BACK BY AND VERIFIED WITH BELTON,C ON 04/01/23 AT 2100 BY LOY,C (NOTE) Elevated high sensitivity troponin I (hsTnI) values and significant  changes across serial measurements may suggest ACS but many other  chronic and acute conditions are known to elevate hsTnI results.  Refer to the Links section for chest pain algorithms and additional  guidance. Performed at [facility], [address], [place], [place] [address]   Digoxin  level     Status: Abnormal   Collection Time: 04/01/23  7:47 PM  Result Value Ref Range   Digoxin  Level <0.2 (L) 0.8 - 2.0 ng/mL    Comment: RESULTS CONFIRMED BY MANUAL DILUTION Performed at [facility], [address] [place], [place] [address]   Troponin I (High Sensitivity)     Status: Abnormal   Collection Time: 04/01/23  9:19 PM  Result Value Ref Range   Troponin I (High Sensitivity) 2,319 (HH) <18 ng/L    Comment: DELTA CHECK NOTED CRITICAL VALUE NOTED. VALUE IS CONSISTENT WITH PREVIOUSLY REPORTED/CALLED VALUE (NOTE) Elevated high sensitivity troponin I (hsTnI) values and significant  changes across serial measurements may suggest ACS but many other  chronic and acute conditions are known to elevate hsTnI results.  Refer to the Links section for chest pain algorithms  and additional  guidance. Performed at [facility], [address] [place], [place] [address]   Comprehensive metabolic panel     Status: Abnormal   Collection Time: 04/01/23 11:41 PM  Result Value Ref Range   Sodium 134 (L) 135 - 145 mmol/L   Potassium 4.0 3.5 - 5.1 mmol/L   Chloride 102 98 - 111 mmol/L   CO2 22 22 - 32 mmol/L   Glucose, Bld 140 (H) 70 - 99 mg/dL    Comment: Glucose reference range applies only to samples taken after fasting for at least 8 hours.   BUN 25 (H) 8 - 23 mg/dL   Creatinine, Ser 8.32 (H) 0.44 - 1.00 mg/dL   Calcium  9.4 8.9 - 10.3 mg/dL   Total Protein 6.9 6.5 - 8.1 g/dL   Albumin 3.6 3.5 - 5.0 g/dL   AST 52 (H) 15 - 41 U/L   ALT 13 0 - 44 U/L   Alkaline Phosphatase 29 (L) 38 - 126 U/L   Total Bilirubin 1.0 0.0 - 1.2 mg/dL   GFR, Estimated 31 (L) >60 mL/min    Comment: (NOTE) Calculated using the CKD-EPI Creatinine Equation (2021)    Anion gap 10 5 - 15    Comment: Performed at [facility], [address] [place], [place] [address]  Magnesium      Status: None   Collection Time: 04/01/23 11:41 PM  Result Value Ref Range   Magnesium  2.3 1.7 - 2.4 mg/dL    Comment: Performed at [facility], [address] [place], [place] [address]  Brain natriuretic peptide     Status: Abnormal   Collection Time: 04/01/23 11:41 PM  Result Value Ref Range   B Natriuretic Peptide 639.0 (H) 0.0 - 100.0 pg/mL    Comment: Performed at [facility], [address] [place], [place] [address]  CBC with Differential     Status: Abnormal   Collection Time: 04/01/23 11:41 PM  Result Value Ref Range   WBC 12.3 (H) 4.0 - 10.5 K/uL   RBC  3.78 (L) 3.87 - 5.11 MIL/uL   Hemoglobin 12.1 12.0 - 15.0 g/dL   HCT 63.1 63.9 - 53.9 %   MCV 97.4 80.0 - 100.0 fL   MCH 32.0 26.0 - 34.0 pg   MCHC 32.9 30.0 - 36.0 g/dL   RDW 87.0 88.4 - 84.4 %   Platelets 199 150 - 400 K/uL   nRBC 0.0 0.0 - 0.2 %   Neutrophils Relative % 77 %   Neutro Abs 9.7 (H) 1.7 - 7.7 K/uL   Lymphocytes  Relative 16 %   Lymphs Abs 1.9 0.7 - 4.0 K/uL   Monocytes Relative 5 %   Monocytes Absolute 0.6 0.1 - 1.0 K/uL   Eosinophils Relative 1 %   Eosinophils Absolute 0.1 0.0 - 0.5 K/uL   Basophils Relative 0 %   Basophils Absolute 0.1 0.0 - 0.1 K/uL   Immature Granulocytes 1 %   Abs Immature Granulocytes 0.06 0.00 - 0.07 K/uL    Comment: Performed at [facility], [address] [place], [place] [address]  APTT     Status: Abnormal   Collection Time: 04/02/23  5:26 AM  Result Value Ref Range   aPTT 70 (H) 24 - 36 seconds    Comment:        IF BASELINE aPTT IS ELEVATED, SUGGEST PATIENT RISK ASSESSMENT BE USED TO DETERMINE APPROPRIATE ANTICOAGULANT THERAPY. Performed at [facility], [address], [place], [place] [address]   Heparin  level (unfractionated)     Status: Abnormal   Collection Time: 04/02/23  5:26 AM  Result Value Ref Range   Heparin  Unfractionated >1.10 (H) 0.30 - 0.70 IU/mL    Comment: (NOTE) The clinical reportable range upper limit is being lowered to >1.10 to align with the FDA approved guidance for the current laboratory assay.  If heparin  results are below expected values, and patient dosage has  been confirmed, suggest follow up testing of antithrombin III levels. Performed at [facility], [address] [place], [place] [address]   CBC     Status: Abnormal   Collection Time: 04/02/23  5:26 AM  Result Value Ref Range   WBC 9.1 4.0 - 10.5 K/uL   RBC 3.79 (L) 3.87 - 5.11 MIL/uL   Hemoglobin 12.0 12.0 - 15.0 g/dL   HCT 63.2 63.9 - 53.9 %   MCV 96.8 80.0 - 100.0 fL   MCH 31.7 26.0 - 34.0 pg   MCHC 32.7 30.0 - 36.0 g/dL   RDW 86.9 88.4 - 84.4 %   Platelets 201 150 - 400 K/uL   nRBC 0.0 0.0 - 0.2 %    Comment: Performed at [facility], [address], [place], [place] [address]  Troponin I (High Sensitivity)     Status: Abnormal   Collection Time: 04/02/23  5:26 AM  Result Value Ref Range   Troponin I (High Sensitivity) 11,376 (HH) <18 ng/L    Comment:  DELTA CHECK NOTED CRITICAL RESULT CALLED TO, READ BACK BY AND VERIFIED WITH MORGAN EANES @ [phone] ON 04/02/23 C VARNER (NOTE) Elevated high sensitivity troponin I (hsTnI) values and significant  changes across serial measurements may suggest ACS but many other  chronic and acute conditions are known to elevate hsTnI results.  Refer to the Links section for chest pain algorithms and additional  guidance. Performed at [facility], [address], [place], [place] [address]   APTT     Status: Abnormal   Collection Time: 04/02/23  2:43 PM  Result Value Ref Range  aPTT 81 (H) 24 - 36 seconds    Comment:        IF BASELINE aPTT IS ELEVATED, SUGGEST PATIENT RISK ASSESSMENT BE USED TO DETERMINE APPROPRIATE ANTICOAGULANT THERAPY. Performed at [facility], [address], [place], [place] [address]    DG Chest Port 1 View Result Date: 04/01/2023 CLINICAL DATA:  Dizziness and weakness. EXAM: PORTABLE CHEST 1 VIEW COMPARISON:  February 02, 2023 FINDINGS: There is stable single lead ventricular pacer positioning. The cardiac silhouette is mildly enlarged and unchanged in size. There is moderate severity calcification of the aortic arch. Both lungs are clear. Multilevel degenerative changes seen throughout the thoracic spine. IMPRESSION: No active cardiopulmonary disease. Electronically Signed   By: Suzen Dials M.D.   On: 04/01/2023 20:36    Pending Labs Unresulted Labs (From admission, onward)     Start     Ordered   04/03/23 0500  APTT  Daily,   R      04/01/23 2232   04/02/23 0500  Heparin  level (unfractionated)  Daily,   R      04/01/23 2232   04/02/23 0500  CBC  Daily,   R      04/01/23 2232   Signed and Held  Lipoprotein A (LPA)  Tomorrow morning,   R        Signed and Held   Signed and Held  Basic metabolic panel  Tomorrow morning,   R        Signed and Held   Signed and Held  CBC  Tomorrow morning,   R        Signed and Held            Vitals/Pain Today's Vitals    04/02/23 1206 04/02/23 1300 04/02/23 1400 04/02/23 1500  BP: 124/74 118/70 129/64 113/66  Pulse: 99 97 98 98  Resp: 17 15 15 18   Temp: 97.8 F (36.6 C)     TempSrc: Oral     SpO2: 94% 96% 93% 96%  Weight:      Height:      PainSc:        Isolation Precautions No active isolations  Medications Medications  metoprolol  tartrate (LOPRESSOR ) injection 2.5 mg (2.5 mg Intravenous Given 04/01/23 1959)  heparin  ADULT infusion 100 units/mL (25000 units/250mL) (850 Units/hr Intravenous New Bag/Given 04/01/23 2312)  acetaminophen  (TYLENOL ) tablet 650 mg (has no administration in time range)  ondansetron  (ZOFRAN ) injection 4 mg (has no administration in time range)  metoprolol  succinate (TOPROL -XL) 24 hr tablet 50 mg (50 mg Oral Given 04/02/23 0931)  aspirin  EC tablet 81 mg (has no administration in time range)  rosuvastatin  (CRESTOR ) tablet 20 mg (20 mg Oral Given 04/02/23 0930)  aspirin  chewable tablet 324 mg (324 mg Oral Given 04/01/23 2132)    Mobility Patient has been non-ambulatory since this admission. On a regular day she walks, occasionally with a walker.      Focused Assessments    R Recommendations: See Admitting Provider Note  Report given to:   Additional Notes:

## 2023-04-02 NOTE — Progress Notes (Addendum)
 PHARMACY - ANTICOAGULATION CONSULT NOTE  Pharmacy Consult for IV heparin   Indication: chest pain/ACS  Allergies  Allergen Reactions   Iodinated Contrast Media Shortness Of Breath    Per Patient- The last time she had contrast during a scan she had difficulty breathing   Codeine Hives   Shrimp [Shellfish Allergy] Hives and Other (See Comments)    Broke out   Amiodarone  Nausea And Vomiting   Penicillins Itching and Rash    swelling of the face/tongue/throat, SOB, or low BP sudden or severe rash/hives, skin peeling, or any reaction on the inside of your mouth or nose    Sulfa Antibiotics Itching    Patient Measurements: Height: 5' 2 (157.5 cm) Weight: 85 kg (187 lb 6.3 oz) IBW/kg (Calculated) : 50.1 Heparin  Dosing Weight: 69.3 kg  Vital Signs: Temp: 98.4 F (36.9 C) (01/14 0721) Temp Source: Oral (01/14 0721) BP: 103/46 (01/14 0700) Pulse Rate: 98 (01/14 0700)  Labs: Recent Labs    04/01/23 1920 04/01/23 2119 04/01/23 2341 04/02/23 0526  HGB 12.4  --  12.1 12.0  HCT 37.5  --  36.8 36.7  PLT 200  --  199 201  APTT  --   --   --  70*  HEPARINUNFRC  --   --   --  >1.10*  CREATININE 1.82*  --  1.67*  --   TROPONINIHS 196* 2,319*  --   --     Estimated Creatinine Clearance: 26.7 mL/min (A) (by C-G formula based on SCr of 1.67 mg/dL (H)).  Medical History: Past Medical History:  Diagnosis Date   Arthritis    Asthma    Chronic HFrEF (heart failure with reduced ejection fraction) (HCC)    Chronic kidney disease, stage 3b (HCC)    Diabetes mellitus (HCC)    Hypercholesterolemia    Hyperlipidemia    Ischemic cardiomyopathy    STEMI (ST elevation myocardial infarction) (HCC)    a. 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 year old female admitted on 04/01/2023 with concern for ACS. Eliquis  prior to admission (last dose 1/13 AM). Pharmacy consulted to dose heparin . Will monitor APTT and HL until correlates.  APTT 70  sec, therapeutic. No issues with infusion or bleeding per RN  Goal of Therapy:  APTT 66-102 Heparin  level 0.3-0.7 units/ml Monitor platelets by anticoagulation protocol: Yes   Plan:  Continue Heparin  infusion at 850 units/hr (no bolus) 8 hr aPTT , confirmatory level Daily aPTT, heparin  level, CBC, and monitoring for bleeding F/u plans for anticoagulation and cards recs  Thank you for allowing pharmacy to participate in this patient's care.  Agastya Meister, BS Pharm D, BCPS Clinical Pharmacist 04/02/2023,7:26 AM

## 2023-04-02 NOTE — H&P (Addendum)
 Cardiology Admission History and Physical   Patient ID: MERLINDA WRUBEL MRN: [id number]; DOB: 04-13-1940   Admission date: 04/01/2023  PCP:  Pcp, No   [facility] Providers Cardiologist:  Diannah SHAUNNA Maywood, MD  Electrophysiologist:  Danelle Birmingham, MD      Chief Complaint: NSTEMI  Patient Profile:   SAKIYA STEPKA is a 83 y.o. female with past medical history of CAD (s/p STEMI in 06/2019 with DES to Proximal LAD), chronic HFrEF (EF 30-35% in 09/2019, at 20-25% by echo in 11/2022), VT (s/p St. Jude ICD in place), paroxysmal atrial fibrillation (s/p ablation in 03/2021 with recurrence, not a candidate for repeat ablation due to obesity and age and previously intolerant to Amiodarone ), aortic stenosis, HTN, HLD, Stage 3 CKD and anemia who is being seen 04/02/2023 for the evaluation of an NSTEMI and atrial flutter with RVR.  History of Present Illness:   Ms. Dimaria was evaluated by EP during an admission in 11/2022 as her ICD had fired. It was felt that her initial rhythm was possibly atrial fibrillation and she did have multiple electrolyte abnormalities on admission. She had received ATP which degenerated the rhythm into VF and had been shocked by her defibrillator at that time. She did have a history of inappropriate ICD shock as well for atrial fibrillation with RVR. She did follow-up with Dr. Mallipeddi in 01/2023 and no changes were made to medications at that time. She was continued on Eliquis  2.5 mg twice daily, Digoxin  0.0625 mg 3 times weekly, Lasix  20 mg daily, Toprol -XL 50 mg daily, Simvastatin  20 mg daily and Spironolactone  25 mg daily. Entresto  had previously been held during her admission given hypotension and this was not restarted at the time of her office visit.  She presented to [facility] yesterday evening for evaluation of sudden dizziness and weakness. EMS reported she had been in SVT with heart rate 185 upon their arrival and she converted to atrial fibrillation prior to  arriving to the ED. In talking with the patient today, she reports being in her normal state of health until yesterday evening when she developed dizziness and a funny feeling in her chest after going to the restroom. She does report having chest pressure at that time and could feel palpitations as well. Reports prior to this, she has been in her normal state of health over the past few weeks with no chest pain or significant dyspnea when doing routine activities around her home. Says that she had been without Digoxin  due to her pharmacy not having this in stock but had restarted the medication at least 2 to 3 days prior to admission. Reports good compliance with Toprol -XL, Lasix  and Eliquis .  Initial labs showed WBC 10.7, Hgb 12.4, platelets 200, Na+ 134, K+ 3.9 and creatinine 1.82 (previously 2.14 in 02/2023). Mg 2.1. TSH 3.315. BNP 455. Dig Level < 0.2. Initial and repeat Hs Troponin elevated at 196 and 2319. CXR with no active cardiopulmonary disease. EKG shows atrial flutter with RVR, heart rate 126 with IVCD and ST depression along the inferior and lateral leads.  He received IV Lopressor  2.5 mg x 2 and did convert back to normal sinus rhythm. Reports feeling back to baseline this morning and denies any recent chest pain or dyspnea.  Past Medical History:  Diagnosis Date   Arthritis    Asthma    Chronic HFrEF (heart failure with reduced ejection fraction) (HCC)    Chronic kidney disease, stage 3b (HCC)  Diabetes mellitus (HCC)    Hypercholesterolemia    Hyperlipidemia    Ischemic cardiomyopathy    STEMI (ST elevation myocardial infarction) (HCC)    a. 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    Past Surgical History:  Procedure Laterality Date   ABDOMINAL HYSTERECTOMY     ATRIAL FIBRILLATION ABLATION N/A 04/05/2021   Procedure: ATRIAL FIBRILLATION ABLATION;  Surgeon: Inocencio Soyla Lunger, MD;  Location: [facility] INVASIVE CV LAB;  Service: Cardiovascular;  Laterality: N/A;   CATARACT  EXTRACTION W/PHACO Right 06/27/2020   Procedure: CATARACT EXTRACTION PHACO AND INTRAOCULAR LENS PLACEMENT RIGHT EYE AND PLACEMENT OF CORTICOSTEROID;  Surgeon: Harrie Agent, MD;  Location: [facility] ORS;  Service: Ophthalmology;  Laterality: Right;  right CDE=25.47   CORONARY/GRAFT ACUTE MI REVASCULARIZATION N/A 06/29/2019   Procedure: Coronary/Graft Acute MI Revascularization;  Surgeon: Claudene Victory ORN, MD;  Location: [facility] INVASIVE CV LAB;  Service: Cardiovascular;  Laterality: N/A;   ICD IMPLANT N/A 12/14/2019   Procedure: ICD IMPLANT;  Surgeon: Waddell Danelle ORN, MD;  Location: [facility] INVASIVE CV LAB;  Service: Cardiovascular;  Laterality: N/A;   LEFT HEART CATH AND CORONARY ANGIOGRAPHY N/A 06/29/2019   Procedure: LEFT HEART CATH AND CORONARY ANGIOGRAPHY;  Surgeon: Claudene Victory ORN, MD;  Location: [facility] INVASIVE CV LAB;  Service: Cardiovascular;  Laterality: N/A;   RIGHT HEART CATH N/A 12/30/2020   Procedure: RIGHT HEART CATH;  Surgeon: Rolan Ezra RAMAN, MD;  Location: [facility] INVASIVE CV LAB;  Service: Cardiovascular;  Laterality: N/A;   TEE WITHOUT CARDIOVERSION N/A 04/04/2021   Procedure: TRANSESOPHAGEAL ECHOCARDIOGRAM (TEE);  Surgeon: Jeffrie Oneil BROCKS, MD;  Location: [facility] ENDOSCOPY;  Service: Cardiovascular;  Laterality: N/A;     Medications Prior to Admission: Prior to Admission medications   Medication Sig Start Date End Date Taking? Authorizing Provider  acetaminophen  (TYLENOL ) 325 MG tablet Take 2 tablets (650 mg total) by mouth 3 (three) times daily. 01/10/23   Johnson, Clanford L, MD  apixaban  (ELIQUIS ) 2.5 MG TABS tablet Take 1 tablet (2.5 mg total) by mouth 2 (two) times daily. 02/02/23   Geroldine Berg, MD  Digoxin  62.5 MCG TABS Take 0.0625 mg by mouth 3 (three) times a week. Taking Monday, Wednesday, and Friday. 03/25/23   Mallipeddi, Vishnu P, MD  furosemide  (LASIX ) 20 MG tablet Take 1 tablet (20 mg total) by mouth daily. 02/05/23   Mallipeddi, Vishnu P, MD  lidocaine  (LIDODERM ) 5 % Place 1 patch onto the skin  daily as needed (back pain). Remove & Discard patch within 12 hours or as directed by MD 02/02/23   Geroldine Berg, MD  metoprolol  succinate (TOPROL -XL) 50 MG 24 hr tablet TAKE 1 TABLET(50 MG) BY MOUTH AT BEDTIME 02/05/23   Mallipeddi, Vishnu P, MD  nitroGLYCERIN  (NITROSTAT ) 0.4 MG SL tablet Place 1 tablet (0.4 mg total) under the tongue every 5 (five) minutes as needed. 07/02/19   Henry Manuelita NOVAK, NP  nystatin  (MYCOSTATIN /NYSTOP ) powder Apply topically 3 (three) times daily. 12/09/22   Lue Elsie BROCKS, MD  oxycodone  (OXY-IR) 5 MG capsule Take 1 capsule (5 mg total) by mouth every 6 (six) hours as needed for pain. 01/10/23   Johnson, Clanford L, MD  pantoprazole  (PROTONIX ) 40 MG tablet Take 1 tablet (40 mg total) by mouth daily. 02/02/23 02/02/24  Geroldine Berg, MD  simvastatin  (ZOCOR ) 20 MG tablet TAKE 1 TABLET(20 MG) BY MOUTH EVERY EVENING 02/02/23   Geroldine Berg, MD  spironolactone  (ALDACTONE ) 25 MG tablet Take 1 tablet (25 mg total) by mouth at bedtime. 02/05/23  Mallipeddi, Diannah SQUIBB, MD     Allergies:    Allergies  Allergen Reactions   Iodinated Contrast Media Shortness Of Breath    Per Patient- The last time she had contrast during a scan she had difficulty breathing   Codeine Hives   Shrimp [Shellfish Allergy] Hives and Other (See Comments)    Broke out   Amiodarone  Nausea And Vomiting   Penicillins Itching and Rash    swelling of the face/tongue/throat, SOB, or low BP sudden or severe rash/hives, skin peeling, or any reaction on the inside of your mouth or nose    Sulfa Antibiotics Itching    Social History:   Social History   Socioeconomic History   Marital status: Married    Spouse name: Not on file   Number of children: Not on file   Years of education: Not on file   Highest education level: Not on file  Occupational History   Not on file  Tobacco Use   Smoking status: Never   Smokeless tobacco: Former    Types: Chew   Tobacco comments:    Former chew  tobacco 05/03/21  Vaping Use   Vaping status: Never Used  Substance and Sexual Activity   Alcohol use: Not Currently   Drug use: Never   Sexual activity: Not on file  Other Topics Concern   Not on file  Social History Narrative   ** Merged History Encounter **        Family History:   The patient's family history includes Heart disease in her mother.    ROS:  Please see the history of present illness. All other ROS reviewed and negative.     Physical Exam/Data:   Vitals:   04/02/23 0400 04/02/23 0600 04/02/23 0700 04/02/23 0721  BP: (!) 117/52 (!) 126/55 (!) 103/46   Pulse: 96 96 98   Resp: 16  16   Temp:    98.4 F (36.9 C)  TempSrc:    Oral  SpO2: 96% 94% 95%   Weight:      Height:       No intake or output data in the 24 hours ending 04/02/23 0857    04/01/2023    6:39 PM 02/11/2023   10:43 AM 02/02/2023    1:17 AM  Last 3 Weights  Weight (lbs) 187 lb 6.3 oz 187 lb 12.8 oz 195 lb 15.8 oz  Weight (kg) 85 kg 85.186 kg 88.9 kg     Body mass index is 34.27 kg/m.  General: Pleasant, elderly female appearing in NAD.  HEENT: normal Neck: no JVD Vascular: No carotid bruits; Distal pulses 2+ bilaterally   Cardiac:  normal S1, S2; RRR; 2/6 SEM along RUSB.  Lungs:  clear to auscultation bilaterally, no wheezing, rhonchi or rales  Abd: soft, nontender, no hepatomegaly  Ext: no pitting edema Musculoskeletal:  No deformities, BUE and BLE strength normal and equal Skin: warm and dry  Neuro:  CNs 2-12 intact, no focal abnormalities noted Psych:  Normal affect    EKG:  The ECG that was done  was personally reviewed and demonstrates: Atrial flutter with RVR, heart rate 126 with IVCD and ST depression along the inferior and lateral leads  Relevant CV Studies:  Echocardiogram: 11/2022 IMPRESSIONS    1. Left ventricular ejection fraction, by estimation, is 20 to 25%. The  left ventricle has severely decreased function. The left ventricle  demonstrates regional wall  motion abnormalities (see scoring  diagram/findings for description). Left ventricular  diastolic parameters are consistent with Grade III diastolic dysfunction  (restrictive). Elevated left atrial pressure.   2. Right ventricular systolic function is normal. The right ventricular  size is normal.   3. The mitral valve is normal in structure. No evidence of mitral valve  regurgitation. No evidence of mitral stenosis.   4. The aortic valve is normal in structure. Aortic valve regurgitation is  not visualized. Mild to moderate aortic valve stenosis. Aortic valve area,  by VTI measures 1.13 cm. Aortic valve mean gradient measures 8.0 mmHg.  Aortic valve Vmax measures 1.76  m/s.   5. The inferior vena cava is normal in size with greater than 50%  respiratory variability, suggesting right atrial pressure of 3 mmHg.   Laboratory Data:  High Sensitivity Troponin:   Recent Labs  Lab 04/01/23 1920 04/01/23 2119 04/02/23 0526  TROPONINIHS 196* 2,319* 11,376*      Chemistry Recent Labs  Lab 04/01/23 1920 04/01/23 2341  NA 134* 134*  K 3.9 4.0  CL 100 102  CO2 24 22  GLUCOSE 163* 140*  BUN 26* 25*  CREATININE 1.82* 1.67*  CALCIUM  9.5 9.4  MG 2.1 2.3  GFRNONAA 28* 31*  ANIONGAP 10 10    Recent Labs  Lab 04/01/23 2341  PROT 6.9  ALBUMIN 3.6  AST 52*  ALT 13  ALKPHOS 29*  BILITOT 1.0    Hematology Recent Labs  Lab 04/01/23 2341 04/02/23 0526  WBC 12.3* 9.1  RBC 3.78* 3.79*  HGB 12.1 12.0  HCT 36.8 36.7  MCV 97.4 96.8  MCH 32.0 31.7  MCHC 32.9 32.7  RDW 12.9 13.0  PLT 199 201   Thyroid   Recent Labs  Lab 04/01/23 1920  TSH 3.315   BNP Recent Labs  Lab 04/01/23 1920 04/01/23 2341  BNP 455.0* 639.0*     Radiology/Studies:  DG Chest Port 1 View Result Date: 04/01/2023 CLINICAL DATA:  Dizziness and weakness. EXAM: PORTABLE CHEST 1 VIEW COMPARISON:  February 02, 2023 FINDINGS: There is stable single lead ventricular pacer positioning. The cardiac  silhouette is mildly enlarged and unchanged in size. There is moderate severity calcification of the aortic arch. Both lungs are clear. Multilevel degenerative changes seen throughout the thoracic spine. IMPRESSION: No active cardiopulmonary disease. Electronically Signed   By: Suzen Dials M.D.   On: 04/01/2023 20:36     Assessment and Plan:   1. NSTEMI - Presented with chest pain and dizziness, was initially in SVT by EMS report but in atrial flutter with RVR upon arrival to the ED.  She has converted back to normal sinus rhythm and denies any symptoms at this current time. - Initial and repeat Hs Troponin are elevated at 196 and 2319. Will recheck to assess trend. Initial EKG did show ST depression along the inferior and lateral leads but improved by repeat tracings. - At this time, the plan is to transfer to [facility] with likely cardiac catheterization tomorrow following Eliquis  washout.  Reviewed with Dr. Debera and no plans to obtain a limited echocardiogram at this time since she just had imaging in 11/2022.  Continue IV Heparin  along with ASA 81mg  daily, Toprol -XL 50 mg daily and statin therapy (will switch Simvastatin  to Crestor  20mg  daily).   Addendum: Repeat Hs Troponin resulted at 11,376. She is pain-free at this time. Continue current medical therapy and anticipate cardiac catheterization tomorrow pending reassessment of renal function and following wash-out of Eliquis .   2. CAD - She is s/p STEMI in 06/2019 with  DES to Proximal LAD.  Would anticipate repeat cardiac catheterization later this admission as discussed above. Continue IV Heparin , ASA, Toprol -XL and statin therapy.  3. Chronic HFrEF - Her EF was at 20-25% by echo in 11/2022. She denies any recent respiratory issues and appears euvolemic by examination today. Continue Toprol -XL 50 mg daily and Digoxin  0.0625 mg MWF. Will hold Lasix  and Spironolactone  given upcoming cardiac catheterization.  Previously on Entresto  but  this was discontinued given variable renal function. Would consider restarting this or an SGLT2 inhibitor pending renal function given her long-standing cardiomyopathy.   4. Paroxysmal Atrial Fibrillation/Flutter - She underwent ablation in 03/2021 with recurrence and not to be a candidate for repeat ablation due to obesity and age. Previously intolerant to Amiodarone .  - Was in atrial flutter with RVR upon admission and now in NSR. Would recommend EP evaluation once at [facility] as she is very symptomatic when in atrial flutter with RVR and by review of EP notes, this has led to two inappropriate ICD shocks in the past. Continue Toprol -XL and Digoxin  for now. On Eliquis  PTA but held for now given the need for cardiac catheterization and on IV Heparin .    5. VT - She is s/p St. Jude ICD placement. Followed by Dr. Waddell.   6. Aortic Stenosis - This was mild to moderate by echo in 11/2022. Continue to follow as an outpatient.   7. Stage 3 CKD - Baseline creatinine 1.6 - 1.7. Peaked at 3.08 in 12/2022. At 1.82 on admission and improved to 1.67 today.     Risk Assessment/Risk Scores:    TIMI Risk Score for Unstable Angina or Non-ST Elevation MI:   The patient's TIMI risk score is 6, which indicates a 41% risk of all cause mortality, new or recurrent myocardial infarction or need for urgent revascularization in the next 14 days.    CHA2DS2-VASc Score = 7   This indicates a 11.2% annual risk of stroke. The patient's score is based upon: CHF History: 1 HTN History: 1 Diabetes History: 1 Stroke History: 0 Vascular Disease History: 1 Age Score: 2 Gender Score: 1   For questions or updates, please contact  [facility] Please consult [email or web address] for contact info under     Signed, Laymon CHRISTELLA Qua, PA-C  04/02/2023 8:57 AM    Attending note:  Patient seen and examined.  I reviewed her records and discussed the case with Ms. Strader PA-C, I agree with her above  findings.  Patient pending transfer to the cardiology service at [facility], was accepted by cardiology fellow overnight.  She presented to the [facility], ER via EMS reporting onset of fairly sudden chest tightness and shortness of breath yesterday after ambulating back from the bathroom.  No specific sense of palpitations but felt somewhat dizzy.  She reports compliance with her medications, although had been out of digoxin  for a few weeks prior to resuming it over the last 2 to 3 days.  She was reportedly in SVT and subsequently atrial fibrillation/flutter by assessment per EMS.  Cardiac enzymes abnormal and consistent with NSTEMI.  She converted to sinus rhythm spontaneously and reports no chest pain this morning.  History includes known CAD status post DES to the proximal LAD in 2021, HFrEF with ischemic cardiomyopathy and LVEF 20 to 25% status post St. Jude ICD.  She has a history of inappropriate ICD shocks in the setting of atrial arrhythmias and prior intolerance to amiodarone .  Underwent AF/flutter ablation in 2023 with arrhythmia  recurrence, not felt to be a good candidate for repeat ablation by EP.  On examination patient is without chest pain or palpitations.  She is afebrile, heart rate in the 90s, systolic blood pressure 100-120 range.  Lungs are clear.  Cardiac exam with RRR and 2/6 systolic murmur.  No peripheral edema.  Pertinent lab work includes potassium 4.0, BUN 25, creatinine 1.67, BNP 639, high-sensitivity troponin [place] to 11376, hemoglobin 12.0, platelets 201, TSH 3.315.  Presenting ECG consistent with atrial flutter and variable block, IVCD.  Chest x-ray reports no acute process.  NSTEMI, potentially type I event although with recurrent arrhythmia cannot exclude type II event.  Patient currently pain-free and hemodynamically stable.  She is pending transfer to [facility], tentatively plan on diagnostic cardiac catheterization tomorrow after Eliquis  washout.  Transitioning to IV  heparin .  Otherwise continue aspirin , Toprol -XL, and switch from Zocor  to Crestor .  Also recommend EP assessment to consider other atrial arrhythmia management strategies given continued recurrence and a documented history of inappropriate ICD shocks.  Antiarrhythmic options are limited at best, question whether AV node ablation could be pursued.  Jayson JUDITHANN Sierras, M.D., F.A.C.C.

## 2023-04-02 NOTE — ED Notes (Signed)
 Son updated on transfer. Son would like a call from receiving nurse when patient has arrived and is settled. Sons name is Jillyn Hidden. Number is in the chart

## 2023-04-02 NOTE — ED Notes (Signed)
 Contacted MD and pharmacist about heparin level.

## 2023-04-02 NOTE — ED Notes (Signed)
 Handoff to Deanna for lunch.

## 2023-04-02 NOTE — ED Notes (Signed)
 Family updated as to patient's status.

## 2023-04-02 NOTE — ED Notes (Signed)
 Pt and bed readjusted for comfort

## 2023-04-02 NOTE — ED Notes (Signed)
 Consultation between patients providers have stated pt is pain free and is cleared to remain safely at [facility] until an appropriate bed is available at [facility].

## 2023-04-02 NOTE — ED Notes (Signed)
 Pts son contacted and made aware of her departure from our facility to [facility].

## 2023-04-02 NOTE — ED Notes (Signed)
 Requested special dinner order at the request of the patient. I explained that the kitchen does not have many choices each day like the larger locations. Spoke with son to give an update and communicated the patients wishes for what she would like him to bring her for dinner.

## 2023-04-02 NOTE — ED Notes (Signed)
 Attempting to contact pharmacist at [facility] about heparin due to [facility] Pharmacist not being on campus.

## 2023-04-02 NOTE — Progress Notes (Signed)
 PHARMACY - ANTICOAGULATION CONSULT NOTE  Pharmacy Consult for IV heparin   Indication: chest pain/ACS  Allergies  Allergen Reactions   Iodinated Contrast Media Shortness Of Breath    Per Patient- The last time she had contrast during a scan she had difficulty breathing   Codeine Hives   Shrimp [Shellfish Allergy] Hives and Other (See Comments)    Broke out   Amiodarone  Nausea And Vomiting   Penicillins Itching and Rash    swelling of the face/tongue/throat, SOB, or low BP sudden or severe rash/hives, skin peeling, or any reaction on the inside of your mouth or nose    Sulfa Antibiotics Itching    Patient Measurements: Height: 5' 2 (157.5 cm) Weight: 85 kg (187 lb 6.3 oz) IBW/kg (Calculated) : 50.1 Heparin  Dosing Weight: 69.3 kg  Vital Signs: Temp: 97.8 F (36.6 C) (01/14 1206) Temp Source: Oral (01/14 1206) BP: 113/66 (01/14 1500) Pulse Rate: 98 (01/14 1500)  Labs: Recent Labs    04/01/23 1920 04/01/23 2119 04/01/23 2341 04/02/23 0526 04/02/23 1443  HGB 12.4  --  12.1 12.0  --   HCT 37.5  --  36.8 36.7  --   PLT 200  --  199 201  --   APTT  --   --   --  70* 81*  HEPARINUNFRC  --   --   --  >1.10*  --   CREATININE 1.82*  --  1.67*  --   --   TROPONINIHS 196* 2,319*  --  11,376*  --     Estimated Creatinine Clearance: 26.7 mL/min (A) (by C-G formula based on SCr of 1.67 mg/dL (H)).  Medical History: Past Medical History:  Diagnosis Date   Arthritis    Asthma    Chronic HFrEF (heart failure with reduced ejection fraction) (HCC)    Chronic kidney disease, stage 3b (HCC)    Diabetes mellitus (HCC)    Hypercholesterolemia    Hyperlipidemia    Ischemic cardiomyopathy    STEMI (ST elevation myocardial infarction) (HCC)    a. 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 year old female admitted on 04/01/2023 with concern for ACS. Eliquis  prior to admission (last dose 1/13 AM). Pharmacy consulted to dose heparin . Will  monitor APTT and HL until correlates.  APTT 70> 81 sec, remains therapeutic. No issues with infusion or bleeding per RN  Goal of Therapy:  APTT 66-102 Heparin  level 0.3-0.7 units/ml Monitor platelets by anticoagulation protocol: Yes   Plan:  Continue Heparin  infusion at 850 units/hr (no bolus) Daily aPTT, heparin  level, CBC, and monitoring for bleeding F/u plans for anticoagulation and cards recs  Thank you for allowing pharmacy to participate in this patient's care.  Terry Hanson, BS Pharm D, BCPS Clinical Pharmacist 04/02/2023,4:29 PM

## 2023-04-02 NOTE — Telephone Encounter (Signed)
 Patiently currently hospitalized and is being seen by cardiology. I will close encounter

## 2023-04-02 NOTE — ED Notes (Addendum)
 Due to patient previously being on eliquis, heparin levels for this patient are managed by the Aptt levels not heparin levels. For this, Pharmacist at [facility], Tammy Sours stated the pt is to stay at 8.5 on her heparin administration at this time. MD notified.

## 2023-04-03 ENCOUNTER — Encounter (HOSPITAL_COMMUNITY)
Admission: EM | Disposition: A | Discharge status: Home / Self Care | Payer: Self-pay | Source: Home / Self Care | Attending: Cardiology

## 2023-04-03 DIAGNOSIS — I214 Non-ST elevation (NSTEMI) myocardial infarction: Secondary | ICD-10-CM | POA: Diagnosis not present

## 2023-04-03 DIAGNOSIS — I2510 Atherosclerotic heart disease of native coronary artery without angina pectoris: Secondary | ICD-10-CM

## 2023-04-03 HISTORY — PX: LEFT HEART CATH AND CORONARY ANGIOGRAPHY: CATH118249

## 2023-04-03 LAB — BASIC METABOLIC PANEL
Anion gap: 11 (ref 5–15)
Anion gap: 9 (ref 5–15)
BUN: 24 mg/dL — ABNORMAL HIGH (ref 8–23)
BUN: 25 mg/dL — ABNORMAL HIGH (ref 8–23)
CO2: 24 mmol/L (ref 22–32)
CO2: 24 mmol/L (ref 22–32)
Calcium: 9.3 mg/dL (ref 8.9–10.3)
Calcium: 8.9 mg/dL (ref 8.9–10.3)
Chloride: 105 mmol/L (ref 98–111)
Chloride: 103 mmol/L (ref 98–111)
Creatinine, Ser: 1.98 mg/dL — ABNORMAL HIGH (ref 0.44–1.00)
Creatinine, Ser: 1.99 mg/dL — ABNORMAL HIGH (ref 0.44–1.00)
GFR, Estimated: 25 mL/min — ABNORMAL LOW (ref >60)
GFR, Estimated: 25 mL/min — ABNORMAL LOW (ref >60)
Glucose, Bld: 133 mg/dL — ABNORMAL HIGH (ref 70–99)
Glucose, Bld: 125 mg/dL — ABNORMAL HIGH (ref 70–99)
Potassium: 3.9 mmol/L (ref 3.5–5.1)
Potassium: 3.7 mmol/L (ref 3.5–5.1)
Sodium: 140 mmol/L (ref 135–145)
Sodium: 136 mmol/L (ref 135–145)

## 2023-04-03 LAB — POCT ACTIVATED CLOTTING TIME
Activated Clotting Time: 256 s
Activated Clotting Time: 222 s
Activated Clotting Time: 193 s
Activated Clotting Time: 175 s

## 2023-04-03 LAB — CBC
HCT: 37.3 % (ref 36.0–46.0)
HCT: 35.7 % — ABNORMAL LOW (ref 36.0–46.0)
Hemoglobin: 12.5 g/dL (ref 12.0–15.0)
Hemoglobin: 11.9 g/dL — ABNORMAL LOW (ref 12.0–15.0)
MCH: 32.2 pg (ref 26.0–34.0)
MCH: 32.2 pg (ref 26.0–34.0)
MCHC: 33.5 g/dL (ref 30.0–36.0)
MCHC: 33.3 g/dL (ref 30.0–36.0)
MCV: 96.1 fL (ref 80.0–100.0)
MCV: 96.5 fL (ref 80.0–100.0)
Platelets: 212 10*3/uL (ref 150–400)
Platelets: 191 10*3/uL (ref 150–400)
RBC: 3.88 MIL/uL (ref 3.87–5.11)
RBC: 3.7 MIL/uL — ABNORMAL LOW (ref 3.87–5.11)
RDW: 12.9 % (ref 11.5–15.5)
RDW: 12.8 % (ref 11.5–15.5)
WBC: 11 10*3/uL — ABNORMAL HIGH (ref 4.0–10.5)
WBC: 10.6 10*3/uL — ABNORMAL HIGH (ref 4.0–10.5)
nRBC: 0 % (ref 0.0–0.2)
nRBC: 0 % (ref 0.0–0.2)

## 2023-04-03 LAB — GLUCOSE, CAPILLARY: Glucose-Capillary: 149 mg/dL — ABNORMAL HIGH (ref 70–99)

## 2023-04-03 LAB — HEPARIN LEVEL (UNFRACTIONATED): Heparin Unfractionated: 0.73 [IU]/mL — ABNORMAL HIGH (ref 0.30–0.70)

## 2023-04-03 LAB — APTT: aPTT: 50 s — ABNORMAL HIGH (ref 24–36)

## 2023-04-03 SURGERY — LEFT HEART CATH AND CORONARY ANGIOGRAPHY
Anesthesia: LOCAL

## 2023-04-03 MED ORDER — LIDOCAINE HCL (PF) 1 % IJ SOLN
INTRAMUSCULAR | Status: DC | PRN
  Administered 2023-04-03: 2 mL
  Administered 2023-04-03: 10 mL

## 2023-04-03 MED ORDER — DIPHENHYDRAMINE HCL 25 MG PO CAPS
50.0000 mg | ORAL_CAPSULE | Freq: Once | ORAL | Status: AC
  Administered 2023-04-03: 50 mg via ORAL
  Filled 2023-04-03: qty 2

## 2023-04-03 MED ORDER — METHYLPREDNISOLONE SODIUM SUCC 40 MG IJ SOLR
40.0000 mg | INTRAMUSCULAR | Status: AC
  Administered 2023-04-03 (×3): 40 mg via INTRAVENOUS
  Filled 2023-04-03 (×3): qty 1

## 2023-04-03 MED ORDER — FENTANYL CITRATE (PF) 100 MCG/2ML IJ SOLN
INTRAMUSCULAR | Status: DC | PRN
  Administered 2023-04-03 (×2): 50 ug via INTRAVENOUS
  Administered 2023-04-03 (×2): 25 ug via INTRAVENOUS

## 2023-04-03 MED ORDER — SODIUM CHLORIDE 0.9% FLUSH
3.0000 mL | Freq: Two times a day (BID) | INTRAVENOUS | Status: DC
  Administered 2023-04-04: 3 mL via INTRAVENOUS

## 2023-04-03 MED ORDER — SODIUM CHLORIDE 0.9 % IV SOLN
INTRAVENOUS | Status: AC
  Administered 2023-04-03: 100 mL/h via INTRAVENOUS

## 2023-04-03 MED ORDER — SODIUM CHLORIDE 0.9 % IV SOLN: INTRAVENOUS | Status: DC

## 2023-04-03 MED ORDER — HYDRALAZINE HCL 20 MG/ML IJ SOLN: 10.0000 mg | INTRAMUSCULAR | Status: AC | PRN

## 2023-04-03 MED ORDER — FENTANYL CITRATE (PF) 100 MCG/2ML IJ SOLN
INTRAMUSCULAR | Status: AC
  Filled 2023-04-03: qty 2

## 2023-04-03 MED ORDER — ASPIRIN 81 MG PO CHEW
81.0000 mg | CHEWABLE_TABLET | ORAL | Status: AC
  Administered 2023-04-03: 81 mg via ORAL
  Filled 2023-04-03: qty 1

## 2023-04-03 MED ORDER — CLOPIDOGREL BISULFATE 75 MG PO TABS
ORAL_TABLET | ORAL | Status: AC
  Filled 2023-04-03: qty 1

## 2023-04-03 MED ORDER — HEPARIN SODIUM (PORCINE) 1000 UNIT/ML IJ SOLN
INTRAMUSCULAR | Status: AC
  Filled 2023-04-03: qty 10

## 2023-04-03 MED ORDER — HEPARIN SODIUM (PORCINE) 1000 UNIT/ML IJ SOLN
INTRAMUSCULAR | Status: DC | PRN
  Administered 2023-04-03: 8500 [IU] via INTRAVENOUS

## 2023-04-03 MED ORDER — CLOPIDOGREL BISULFATE 300 MG PO TABS: ORAL_TABLET | ORAL | Status: DC | PRN

## 2023-04-03 MED ORDER — LABETALOL HCL 5 MG/ML IV SOLN: 10.0000 mg | INTRAVENOUS | Status: AC | PRN

## 2023-04-03 MED ORDER — IOHEXOL 350 MG/ML SOLN
INTRAVENOUS | Status: DC | PRN
  Administered 2023-04-03: 68 mL via INTRA_ARTERIAL

## 2023-04-03 MED ORDER — CLOPIDOGREL BISULFATE 75 MG PO TABS
ORAL_TABLET | ORAL | Status: AC
  Filled 2023-04-03: qty 8

## 2023-04-03 MED ORDER — PROTAMINE SULFATE 10 MG/ML IV SOLN
INTRAVENOUS | Status: AC
  Filled 2023-04-03: qty 5

## 2023-04-03 MED ORDER — NITROGLYCERIN 1 MG/10 ML FOR IR/CATH LAB
INTRA_ARTERIAL | Status: AC
  Filled 2023-04-03: qty 10

## 2023-04-03 MED ORDER — HEPARIN (PORCINE) IN NACL 1000-0.9 UT/500ML-% IV SOLN
INTRAVENOUS | Status: DC | PRN
  Administered 2023-04-03 (×2): 500 mL via INTRA_ARTERIAL

## 2023-04-03 MED ORDER — SODIUM CHLORIDE 0.9 % IV SOLN: 250.0000 mL | INTRAVENOUS | Status: AC | PRN

## 2023-04-03 MED ORDER — MELATONIN 3 MG PO TABS
3.0000 mg | ORAL_TABLET | Freq: Every day | ORAL | Status: DC
  Administered 2023-04-03 – 2023-04-04 (×2): 3 mg via ORAL
  Filled 2023-04-03 (×2): qty 1

## 2023-04-03 MED ORDER — SODIUM CHLORIDE 0.9% FLUSH
3.0000 mL | INTRAVENOUS | Status: DC | PRN
Start: 2023-04-04 — End: 2023-04-05

## 2023-04-03 MED ORDER — VERAPAMIL HCL 2.5 MG/ML IV SOLN
INTRAVENOUS | Status: AC
  Filled 2023-04-03: qty 2

## 2023-04-03 MED ORDER — HEPARIN (PORCINE) 25000 UT/250ML-% IV SOLN
1100.0000 [IU]/h | INTRAVENOUS | Status: DC
Start: 2023-04-04 — End: 2023-04-04
  Administered 2023-04-04: 1100 [IU]/h via INTRAVENOUS
  Filled 2023-04-03 (×2): qty 250

## 2023-04-03 MED ORDER — DIPHENHYDRAMINE HCL 50 MG/ML IJ SOLN
50.0000 mg | Freq: Once | INTRAMUSCULAR | Status: AC
  Filled 2023-04-03: qty 1

## 2023-04-03 MED ORDER — MIDAZOLAM HCL 2 MG/2ML IJ SOLN
INTRAMUSCULAR | Status: AC
Start: 2023-04-03 — End: ?
  Filled 2023-04-03: qty 2

## 2023-04-03 MED ORDER — MIDAZOLAM HCL 2 MG/2ML IJ SOLN
INTRAMUSCULAR | Status: DC | PRN
  Administered 2023-04-03 (×2): 1 mg via INTRAVENOUS

## 2023-04-03 MED ORDER — LIDOCAINE HCL (PF) 1 % IJ SOLN
INTRAMUSCULAR | Status: AC
  Filled 2023-04-03: qty 30

## 2023-04-03 SURGICAL SUPPLY — 29 items
CATH INFINITI 5 FR AR1 MOD (CATHETERS) IMPLANT
CATH INFINITI 5FR MULTPACK ANG (CATHETERS) IMPLANT
CATH INFINITI AMBI 5FR TG (CATHETERS) IMPLANT
CATH QUICKCROSS SUPP .035X90CM (MICROCATHETER) IMPLANT
CATH STRAIGHT 5FR 65CM (CATHETERS) IMPLANT
CLOSURE PERCLOSE PROSTYLE (VASCULAR PRODUCTS) IMPLANT
DEVICE RAD COMP TR BAND LRG (VASCULAR PRODUCTS) IMPLANT
ELECT DEFIB PAD ADLT CADENCE (PAD) IMPLANT
GLIDESHEATH SLEND A-KIT 6F 22G (SHEATH) IMPLANT
GUIDEWIRE ANGLED .035X150CM (WIRE) IMPLANT
GUIDEWIRE INQWIRE 1.5J.035X260 (WIRE) IMPLANT
GUIDEWIRE SAF TJ AMPL .035X180 (WIRE) IMPLANT
INQWIRE 1.5J .035X260CM (WIRE) ×1
KIT ENCORE 26 ADVANTAGE (KITS) IMPLANT
KIT HEMO VALVE WATCHDOG (MISCELLANEOUS) IMPLANT
KIT MICROPUNCTURE NIT STIFF (SHEATH) IMPLANT
PACK CARDIAC CATHETERIZATION (CUSTOM PROCEDURE TRAY) ×2 IMPLANT
PINNACLE LONG 6F 25CM (SHEATH) ×1
PINNACLE LONG 7F 25CM (SHEATH) ×1
SET ATX-X65L (MISCELLANEOUS) IMPLANT
SHEATH INTRO PINNACLE 6F 25CM (SHEATH) IMPLANT
SHEATH INTRO PINNACLE 7F 25CM (SHEATH) IMPLANT
SHEATH PINNACLE 4F 10CM (SHEATH) IMPLANT
SHEATH PINNACLE 5F 10CM (SHEATH) IMPLANT
SHEATH PINNACLE 6F 10CM (SHEATH) IMPLANT
SHEATH PROBE COVER 6X72 (BAG) IMPLANT
WIRE AMPLATZ SS-J .035X180CM (WIRE) IMPLANT
WIRE HI TORQ VERSACORE-J 145CM (WIRE) IMPLANT
WIRE RUNTHROUGH .014X180CM (WIRE) IMPLANT

## 2023-04-03 NOTE — Progress Notes (Signed)
 Mobility Specialist Progress Note:    04/03/23 1456  Mobility  Activity Ambulated with assistance in hallway  Level of Assistance Contact guard assist, steadying assist  Assistive Device Front wheel walker  Distance Ambulated (ft) 300 ft  Activity Response Tolerated well  Mobility Referral Yes  Mobility visit 1 Mobility  Mobility Specialist Start Time (ACUTE ONLY) 1120  Mobility Specialist Stop Time (ACUTE ONLY) 1134  Mobility Specialist Time Calculation (min) (ACUTE ONLY) 14 min   Received pt in bed having no complaints and agreeable to mobility. Pt was asymptomatic throughout ambulation and returned to room w/o fault. Left in bed w/ call bell in reach and all needs met.   Inetta Manes Mobility Specialist  Please contact vis Secure Chat or  Rehab Office [phone]

## 2023-04-03 NOTE — Progress Notes (Signed)
 68fr long sheath aspirated and removed from left femoral artery. Manual pressure applied for 40 minutes. Site level 1 slight bruising present from earlier procedure. No s+s of hematoma. Tegaderm dressing applied, bedrest instruction given.  Bilateral dp pulses palpable.    Bedrest begins at 22:00:00

## 2023-04-03 NOTE — Progress Notes (Signed)
 Site area:Left groin a 7 french  23cm arterial sheath was removed  Site Prior to Removal:  Level 0  Pressure Applied For 40 MINUTES    Bedrest Beginning at 2200pm X 4 hours  Manual:   Yes.    Patient Status During Pull:  stable  Post Pull Groin Site:  Level 0  Post Pull Instructions Given:  Yes.    Post Pull Pulses Present:  Yes.    Dressing Applied:  Yes.    Comments:

## 2023-04-03 NOTE — H&P (View-Only) (Signed)
 Patient Name: Terry Hanson Date of Encounter: 04/03/2023 [facility] Cardiologist: Vishnu P Mallipeddi, MD   Interval Summary  .    Up walking in the hallway with mobility team. No chest pain, breathing is better  Vital Signs .    Vitals:   04/03/23 0000 04/03/23 0345 04/03/23 0440 04/03/23 0735  BP: (!) 131/48 (!) 133/58  (!) 128/98  Pulse: (!) 105 (!) 103  (!) 104  Resp: 16 18  18   Temp: 98 F (36.7 C) 98.3 F (36.8 C)  98.5 F (36.9 C)  TempSrc: Oral Oral  Oral  SpO2: 96% 94%  95%  Weight:   85.5 kg   Height:        Intake/Output Summary (Last 24 hours) at 04/03/2023 1107 Last data filed at 04/02/2023 2020 Gross per 24 hour  Intake 240 ml  Output 950 ml  Net -710 ml      04/03/2023    4:40 AM 04/01/2023    6:39 PM 02/11/2023   10:43 AM  Last 3 Weights  Weight (lbs) 188 lb 7.9 oz 187 lb 6.3 oz 187 lb 12.8 oz  Weight (kg) 85.5 kg 85 kg 85.186 kg      Telemetry/ECG    Sinus tachycardia, looks like atrial flutter at times - Personally Reviewed  Physical Exam .   GEN: No acute distress.   Neck: No JVD Cardiac: RRR, no murmurs, rubs, or gallops.  Respiratory: Clear to auscultation bilaterally. GI: Soft, nontender, non-distended  MS: No edema  Assessment & Plan .     NSTEMI CAD -- s/p STEMI in 06/2019 with DES to Proximal LAD -- Presented with chest pain and dizziness, was initially in SVT by EMS report but in atrial flutter with RVR upon arrival to the ED. 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  -- holding Lasix  and Spironolactone  with plans for cardiac cath  -- Previously on Entresto  but this was discontinued given variable renal function. Can consider SGLT2i   Paroxysmal Atrial Fibrillation/Flutter -- s/p ablation in 03/2021 with  recurrence and not to be a candidate for repeat ablation due to obesity and age. Previously intolerant to Amiodarone .  -- in atrial flutter with RVR upon admission, converted to sinus rhythm. Telemetry appears to be sinus tachycardia, possible flutter waves noted. Repeat EKG to confirm  -- She reported being very symptomatic when in atrial flutter with RVR and by review of EP notes, this has led to two inappropriate ICD shocks in the past. Likely have EP weigh in on things -- Continue Toprol -XL and Digoxin  for now.  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 Followed by Dr. Carolynne Citron.    Aortic Stenosis --This was mild to moderate by echo in 11/2022. Continue to follow as an outpatient.    Stage 3 CKD -- Baseline Cr recently in the 1.8-2.0 range, 1.9 today -- follow    For questions or updates, please contact [name] Please consult [email or web address] for contact info under        Signed, Johnie Nailer, NP    ATTENDING ATTESTATION:  After conducting a review of all available clinical information with the care team, interviewing the patient, and performing a physical exam, I  agree with the findings and plan described in this note.   GEN: No acute distress.   HEENT:  MMM, no JVD, no scleral icterus Cardiac: RRR, no murmurs, rubs, or gallops.  Respiratory: Clear to auscultation bilaterally. GI: Soft, nontender, non-distended  MS: No edema; No deformity. Neuro:  Nonfocal  Vasc:  +2 radial pulses  Patient remains angina free on medical therapy.  Coronary angiography today given ACS/elevated TIMI risk score.  +2 right radial pulse.  Will optimize HF regimen afterwards.    I have reviewed the risks, indications, and alternatives to cardiac catheterization, possible angioplasty, and stenting with the patient. Risks include but are not limited to bleeding, infection, vascular injury, stroke, myocardial infection, arrhythmia, kidney injury,  radiation-related injury in the case of prolonged fluoroscopy use, emergency cardiac surgery, and death. The patient understands the risks of serious complication is 1-2 in 1000 with diagnostic cardiac cath and 1-2% or less with angioplasty/stenting.    Alyssa Backbone, MD Pager [phone]

## 2023-04-03 NOTE — Interval H&P Note (Signed)
 History and Physical Interval Note:  04/03/2023 3:37 PM  Terry Hanson  has presented today for surgery, with the diagnosis of nstemi.  The various methods of treatment have been discussed with the patient and family. After consideration of risks, benefits and other options for treatment, the patient has consented to  Procedure(s): LEFT HEART CATH AND CORONARY ANGIOGRAPHY (N/A) as a surgical intervention.  The patient's history has been reviewed, patient examined, no change in status, stable for surgery.  I have reviewed the patient's chart and labs.  Questions were answered to the patient's satisfaction.     Aashi Derrington J Juliauna Stueve

## 2023-04-03 NOTE — Progress Notes (Signed)
 PHARMACY - ANTICOAGULATION CONSULT NOTE  Pharmacy Consult for IV heparin   Indication: chest pain/ACS  Allergies  Allergen Reactions   Iodinated Contrast Media Shortness Of Breath    Per Patient- The last time she had contrast during a scan she had difficulty breathing   Codeine Hives   Shrimp [Shellfish Allergy] Hives and Other (See Comments)    Broke out   Amiodarone  Nausea And Vomiting   Penicillins Itching and Rash    swelling of the face/tongue/throat, SOB, or low BP sudden or severe rash/hives, skin peeling, or any reaction on the inside of your mouth or nose    Sulfa Antibiotics Itching    Patient Measurements: Height: 5\' 2"  (157.5 cm) Weight: 85.5 kg (188 lb 7.9 oz) IBW/kg (Calculated) : 50.1 Heparin  Dosing Weight: 69.3 kg  Vital Signs: Temp: 97.5 F (36.4 C) (01/15 1159) Temp Source: Oral (01/15 1159) BP: 120/82 (01/15 1731) Pulse Rate: 88 (01/15 1731)  Labs: Recent Labs    04/01/23 1920 04/01/23 2119 04/01/23 2341 04/02/23 0526 04/02/23 1443 04/03/23 0313 04/03/23 0433  HGB 12.4  --  12.1 12.0  --  12.5 11.9*  HCT 37.5  --  36.8 36.7  --  37.3 35.7*  PLT 200  --  199 201  --  212 191  APTT  --   --   --  70* 81* 50*  --   HEPARINUNFRC  --   --   --  >1.10*  --  0.73*  --   CREATININE 1.82*  --  1.67*  --   --  1.98* 1.99*  TROPONINIHS 196* 2,319*  --  11,376*  --   --   --     Estimated Creatinine Clearance: 22.5 mL/min (A) (by C-G formula based on SCr of 1.99 mg/dL (H)).  Medical History: Past Medical History:  Diagnosis Date   Arthritis    Asthma    Chronic HFrEF (heart failure with reduced ejection fraction) (HCC)    Chronic kidney disease, stage 3b (HCC)    Diabetes mellitus (HCC)    Hypercholesterolemia    Hyperlipidemia    Ischemic cardiomyopathy    STEMI (ST elevation myocardial infarction) (HCC)    a. 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 year old female admitted on 04/01/2023 with  concern for ACS. Eliquis  prior to admission (last dose 1/13 AM). Pharmacy consulted to dose heparin .   S/p cath 1/15, intervention aborted d/t risk of groin hematoma. Plan to restart heparin  8hr after sheath. Sheath removed at 21:30 (After ACT < 170). Planning intervention through radial 1/16.   Goal of Therapy:  APTT 66-102 Heparin  level 0.3-0.7 units/ml Monitor platelets by anticoagulation protocol: Yes   Plan:  Restart heparin  to 1100 units/hr at 0600  F/u aPTT until correlates with heparin  level  F/u 8hr APTT/Heparin  level Monitor daily aPTT, heparin  level, CBC, signs/symptoms of bleeding  F/u cath plans   Thank you for allowing pharmacy to participate in this patient's care.  Dorene Gang, PharmD, BCPS, BCCP Clinical Pharmacist  Please check AMION for all [facility] Pharmacy phone numbers After 10:00 PM, call Main Pharmacy [phone]

## 2023-04-03 NOTE — Progress Notes (Signed)
 TR BAND REMOVAL  LOCATION:    right radial  DEFLATED PER PROTOCOL:    Yes.    TIME BAND OFF / DRESSING APPLIED:    2030pm a clean dry  dressing applied  with gauze and tegaderm  SITE UPON ARRIVAL:    Level 0  SITE AFTER BAND REMOVAL:    Level 0  CIRCULATION SENSATION AND MOVEMENT:    Within Normal Limits   Yes.    COMMENTS:   care instructions given to patient

## 2023-04-03 NOTE — TOC Initial Note (Addendum)
 Transition of Care [facility]) - Initial/Assessment Note    Patient Details  Name: Terry Hanson MRN: [id number] Date of Birth: Mar 18, 1941  [facility] of [facility]) CM/SW Contact:    Jennett Model, RN Phone Number: 04/03/2023, 10:30 AM  Clinical Narrative:                 From home alone, has PCP but he has retired so need a new one and insurance on file, states has no HH services in place at this time, has walker, rollator, bsc, shower chair at home.  States daughter or son will transport them home at [employer] and family is support system, states gets medications from [facility] [address] in [place].  Pta self ambulatory with walker.  She states her daughter stays with her most of the time at her house.  NCM assisted in getting apt with [facility], listed on AVS.  Expected Discharge Plan: Home/Self Care Barriers to Discharge: Continued Medical Work up   Patient Goals and CMS Choice Patient states their goals for this hospitalization and ongoing recovery are:: return home   Choice offered to / list presented to : NA      Expected Discharge Plan and Services In-house Referral: NA Discharge Planning Services: CM Consult Post Acute Care Choice: NA Living arrangements for the past 2 months: Single Family Home                 DME Arranged: N/A DME Agency: NA       HH Arranged: NA          Prior Living Arrangements/Services Living arrangements for the past 2 months: Single Family Home Lives with:: Self Patient language and need for interpreter reviewed:: Yes Do you feel safe going back to the place where you live?: Yes      Need for Family Participation in Patient Care: Yes (Comment) Care giver support system in place?: Yes (comment) Current home services: DME (walker, bsc, shower chair, rollator) Criminal Activity/Legal Involvement Pertinent to Current Situation/Hospitalization: No - Comment as needed  Activities of Daily Living      Permission  Sought/Granted Permission sought to share information with : Case Manager Permission granted to share information with : Yes, Verbal Permission Granted              Emotional Assessment Appearance:: Appears stated age Attitude/Demeanor/Rapport: Engaged Affect (typically observed): Appropriate Orientation: : Oriented to Self, Oriented to Place, Oriented to  Time, Oriented to Situation Alcohol / Substance Use: Not Applicable Psych Involvement: No (comment)  Admission diagnosis:  NSTEMI (non-ST elevated myocardial infarction) [facility]) [I21.4] Patient Active Problem List   Diagnosis Date Noted   NSTEMI (non-ST elevated myocardial infarction) (HCC) 04/01/2023   Paroxysmal A-fib (HCC) 02/11/2023   Fall 01/08/2023   AKI (acute kidney injury) (HCC) 01/08/2023   Hyperkalemia 01/08/2023   Rhabdomyolysis 01/08/2023   Ventricular fibrillation (HCC) 12/05/2022   Anemia 12/04/2022   Cardiac syncope 12/01/2022   Fall at home, initial encounter 12/01/2022   Hypomagnesemia 12/01/2022   Hypokalemia 12/01/2022   Hypocalcemia 12/01/2022   Symptomatic anemia 12/01/2022   Thrombocytopenia (HCC) 12/01/2022   Macrocytic anemia 12/01/2022   Persistent atrial fibrillation (HCC) 12/01/2022   Obesity (BMI 30-39.9) 12/01/2022   ERRONEOUS ENCOUNTER--DISREGARD 08/02/2022   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   Acute respiratory failure with hypercapnia (HCC) 03/20/2020   Atrial fibrillation, chronic (HCC) 03/20/2020   COVID-19 virus infection 03/19/2020   Atrial fibrillation with RVR (HCC) 07/27/2019  CAD (coronary artery disease) 07/27/2019   Stage 3 chronic kidney disease (HCC) 07/27/2019   Nausea and vomiting 07/27/2019   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   Chronic systolic heart failure (HCC) 07/08/2019   Ischemic cardiomyopathy 07/08/2019   Hypercholesterolemia    STEMI (ST elevation myocardial infarction) (HCC)    Asthma    Acute ST elevation  myocardial infarction (STEMI) due to occlusion of left anterior descending (LAD) coronary artery (HCC) 06/29/2019   Acute systolic heart failure (HCC) 06/29/2019   DM II (diabetes mellitus, type II), controlled (HCC) 06/29/2019   Acute ST elevation myocardial infarction (STEMI) involving left anterior descending (LAD) coronary artery (HCC) 06/29/2019   Acute ST elevation myocardial infarction (STEMI) (HCC)    PCP:  Pcp, No Pharmacy:   [facility] [place] - [place], [place] - [address] [place] [address] Phone: [phone] Fax: [phone]  [id number] - [place], [place] - [address] AT [facility] OF [address] & [address] [place] [address] Phone: [phone] Fax: [phone]     Social Drivers of Health (SDOH) Social History: SDOH Screenings   Food Insecurity: No Food Insecurity (04/02/2023)  Housing: High Risk (04/02/2023)  Transportation Needs: No Transportation Needs (04/02/2023)  Utilities: Not At Risk (04/02/2023)  Social Connections: Moderately Isolated (04/02/2023)  Tobacco Use: Medium Risk (04/01/2023)   SDOH Interventions:     Readmission Risk Interventions    04/03/2023   10:25 AM 01/09/2023    1:27 PM  Readmission Risk Prevention Plan  Transportation Screening Complete Complete  PCP or Specialist Appt within 5-7 Days  Not Complete  Home Care Screening  Complete  Medication Review (RN CM)  Complete  HRI or Home Care Consult Complete   Palliative Care Screening Not Applicable   Medication Review (RN Care Manager) Complete

## 2023-04-03 NOTE — Progress Notes (Addendum)
 Patient Name: Terry Hanson Date of Encounter: 04/03/2023 [facility] Cardiologist: Vishnu P Mallipeddi, MD   Interval Summary  .    Up walking in the hallway with mobility team. No chest pain, breathing is better  Vital Signs .    Vitals:   04/03/23 0000 04/03/23 0345 04/03/23 0440 04/03/23 0735  BP: (!) 131/48 (!) 133/58  (!) 128/98  Pulse: (!) 105 (!) 103  (!) 104  Resp: 16 18  18   Temp: 98 F (36.7 C) 98.3 F (36.8 C)  98.5 F (36.9 C)  TempSrc: Oral Oral  Oral  SpO2: 96% 94%  95%  Weight:   85.5 kg   Height:        Intake/Output Summary (Last 24 hours) at 04/03/2023 1107 Last data filed at 04/02/2023 2020 Gross per 24 hour  Intake 240 ml  Output 950 ml  Net -710 ml      04/03/2023    4:40 AM 04/01/2023    6:39 PM 02/11/2023   10:43 AM  Last 3 Weights  Weight (lbs) 188 lb 7.9 oz 187 lb 6.3 oz 187 lb 12.8 oz  Weight (kg) 85.5 kg 85 kg 85.186 kg      Telemetry/ECG    Sinus tachycardia, looks like atrial flutter at times - Personally Reviewed  Physical Exam .   GEN: No acute distress.   Neck: No JVD Cardiac: RRR, no murmurs, rubs, or gallops.  Respiratory: Clear to auscultation bilaterally. GI: Soft, nontender, non-distended  MS: No edema  Assessment & Plan .     NSTEMI CAD -- s/p STEMI in 06/2019 with DES to Proximal LAD -- Presented with chest pain and dizziness, was initially in SVT by EMS report but in atrial flutter with RVR upon arrival to the ED. 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  -- holding Lasix  and Spironolactone  with plans for cardiac cath  -- Previously on Entresto  but this was discontinued given variable renal function. Can consider SGLT2i   Paroxysmal Atrial Fibrillation/Flutter -- s/p ablation in 03/2021 with  recurrence and not to be a candidate for repeat ablation due to obesity and age. Previously intolerant to Amiodarone .  -- in atrial flutter with RVR upon admission, converted to sinus rhythm. Telemetry appears to be sinus tachycardia, possible flutter waves noted. Repeat EKG to confirm  -- She reported being very symptomatic when in atrial flutter with RVR and by review of EP notes, this has led to two inappropriate ICD shocks in the past. Likely have EP weigh in on things -- Continue Toprol -XL and Digoxin  for now.  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 Followed by Dr. Carolynne Citron.    Aortic Stenosis --This was mild to moderate by echo in 11/2022. Continue to follow as an outpatient.    Stage 3 CKD -- Baseline Cr recently in the 1.8-2.0 range, 1.9 today -- follow    For questions or updates, please contact [name] Please consult [email or web address] for contact info under        Signed, Johnie Nailer, NP    ATTENDING ATTESTATION:  After conducting a review of all available clinical information with the care team, interviewing the patient, and performing a physical exam, I  agree with the findings and plan described in this note.   GEN: No acute distress.   HEENT:  MMM, no JVD, no scleral icterus Cardiac: RRR, no murmurs, rubs, or gallops.  Respiratory: Clear to auscultation bilaterally. GI: Soft, nontender, non-distended  MS: No edema; No deformity. Neuro:  Nonfocal  Vasc:  +2 radial pulses  Patient remains angina free on medical therapy.  Coronary angiography today given ACS/elevated TIMI risk score.  +2 right radial pulse.  Will optimize HF regimen afterwards.    I have reviewed the risks, indications, and alternatives to cardiac catheterization, possible angioplasty, and stenting with the patient. Risks include but are not limited to bleeding, infection, vascular injury, stroke, myocardial infection, arrhythmia, kidney injury,  radiation-related injury in the case of prolonged fluoroscopy use, emergency cardiac surgery, and death. The patient understands the risks of serious complication is 1-2 in 1000 with diagnostic cardiac cath and 1-2% or less with angioplasty/stenting.    Alyssa Backbone, MD Pager [phone]

## 2023-04-03 NOTE — Progress Notes (Signed)
 PHARMACY - ANTICOAGULATION CONSULT NOTE  Pharmacy Consult for IV heparin   Indication: chest pain/ACS  Allergies  Allergen Reactions   Iodinated Contrast Media Shortness Of Breath    Per Patient- The last time she had contrast during a scan she had difficulty breathing   Codeine Hives   Shrimp [Shellfish Allergy] Hives and Other (See Comments)    Broke out   Amiodarone  Nausea And Vomiting   Penicillins Itching and Rash    swelling of the face/tongue/throat, SOB, or low BP sudden or severe rash/hives, skin peeling, or any reaction on the inside of your mouth or nose    Sulfa Antibiotics Itching    Patient Measurements: Height: 5\' 2"  (157.5 cm) Weight: 85.5 kg (188 lb 7.9 oz) IBW/kg (Calculated) : 50.1 Heparin  Dosing Weight: 69.3 kg  Vital Signs: Temp: 98.5 F (36.9 C) (01/15 0735) Temp Source: Oral (01/15 0735) BP: 128/98 (01/15 0735) Pulse Rate: 104 (01/15 0735)  Labs: Recent Labs    04/01/23 1920 04/01/23 2119 04/01/23 2341 04/02/23 0526 04/02/23 1443 04/03/23 0313 04/03/23 0433  HGB 12.4  --  12.1 12.0  --  12.5 11.9*  HCT 37.5  --  36.8 36.7  --  37.3 35.7*  PLT 200  --  199 201  --  212 191  APTT  --   --   --  70* 81* 50*  --   HEPARINUNFRC  --   --   --  >1.10*  --  0.73*  --   CREATININE 1.82*  --  1.67*  --   --  1.98* 1.99*  TROPONINIHS 196* 2,319*  --  11,376*  --   --   --     Estimated Creatinine Clearance: 22.5 mL/min (A) (by C-G formula based on SCr of 1.99 mg/dL (H)).  Medical History: Past Medical History:  Diagnosis Date   Arthritis    Asthma    Chronic HFrEF (heart failure with reduced ejection fraction) (HCC)    Chronic kidney disease, stage 3b (HCC)    Diabetes mellitus (HCC)    Hypercholesterolemia    Hyperlipidemia    Ischemic cardiomyopathy    STEMI (ST elevation myocardial infarction) (HCC)    a. 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 year old female admitted on 04/01/2023 with  concern for ACS. Eliquis  prior to admission (last dose 1/13 AM). Pharmacy consulted to dose heparin . Will monitor APTT and HL until correlates.  aPTT 50 sec now subtherapeutic on 850 units (previously therapeutic x2).  Anti-Xa level 0.73 still impacted by recent Eliquis  use.  CBC stable, no issues per RN.  Noted plans for cath today.  Goal of Therapy:  APTT 66-102 Heparin  level 0.3-0.7 units/ml Monitor platelets by anticoagulation protocol: Yes   Plan:  Increase heparin  to 1100 units/hr F/u anticoagulation plans post cath  Thank you for allowing pharmacy to participate in this patient's care.  Cecillia Cogan, PharmD Clinical Pharmacist 04/03/2023  9:36 AM

## 2023-04-03 NOTE — Plan of Care (Signed)
  Problem: Clinical Measurements: Goal: Diagnostic test results will improve Outcome: Progressing Goal: Respiratory complications will improve Outcome: Progressing Goal: Cardiovascular complication will be avoided Outcome: Progressing   Problem: Coping: Goal: Level of anxiety will decrease Outcome: Progressing   Problem: Pain Management: Goal: General experience of comfort will improve Outcome: Progressing   Problem: Safety: Goal: Ability to remain free from injury will improve Outcome: Progressing   Problem: Cardiovascular: Goal: Vascular access site(s) Level 0-1 will be maintained Outcome: Progressing

## 2023-04-04 ENCOUNTER — Encounter (HOSPITAL_COMMUNITY)
Admission: EM | Disposition: A | Discharge status: Home / Self Care | Payer: Self-pay | Source: Home / Self Care | Attending: Cardiology

## 2023-04-04 ENCOUNTER — Encounter (HOSPITAL_COMMUNITY): Payer: Self-pay | Admitting: Cardiology

## 2023-04-04 DIAGNOSIS — I214 Non-ST elevation (NSTEMI) myocardial infarction: Secondary | ICD-10-CM | POA: Diagnosis not present

## 2023-04-04 DIAGNOSIS — I2510 Atherosclerotic heart disease of native coronary artery without angina pectoris: Secondary | ICD-10-CM | POA: Diagnosis not present

## 2023-04-04 HISTORY — PX: CORONARY STENT INTERVENTION: CATH118234

## 2023-04-04 LAB — BASIC METABOLIC PANEL
Anion gap: 9 (ref 5–15)
BUN: 30 mg/dL — ABNORMAL HIGH (ref 8–23)
CO2: 22 mmol/L (ref 22–32)
Calcium: 8.9 mg/dL (ref 8.9–10.3)
Chloride: 105 mmol/L (ref 98–111)
Creatinine, Ser: 1.97 mg/dL — ABNORMAL HIGH (ref 0.44–1.00)
GFR, Estimated: 25 mL/min — ABNORMAL LOW (ref >60)
Glucose, Bld: 205 mg/dL — ABNORMAL HIGH (ref 70–99)
Potassium: 4.1 mmol/L (ref 3.5–5.1)
Sodium: 136 mmol/L (ref 135–145)

## 2023-04-04 LAB — HEPARIN LEVEL (UNFRACTIONATED): Heparin Unfractionated: 0.66 [IU]/mL (ref 0.30–0.70)

## 2023-04-04 LAB — CREATININE, SERUM
Creatinine, Ser: 1.97 mg/dL — ABNORMAL HIGH (ref 0.44–1.00)
GFR, Estimated: 25 mL/min — ABNORMAL LOW (ref >60)

## 2023-04-04 LAB — CBC
HCT: 35 % — ABNORMAL LOW (ref 36.0–46.0)
HCT: 36.6 % (ref 36.0–46.0)
Hemoglobin: 11.6 g/dL — ABNORMAL LOW (ref 12.0–15.0)
Hemoglobin: 11.9 g/dL — ABNORMAL LOW (ref 12.0–15.0)
MCH: 32.2 pg (ref 26.0–34.0)
MCH: 32.1 pg (ref 26.0–34.0)
MCHC: 33.1 g/dL (ref 30.0–36.0)
MCHC: 32.5 g/dL (ref 30.0–36.0)
MCV: 97.2 fL (ref 80.0–100.0)
MCV: 98.7 fL (ref 80.0–100.0)
Platelets: 233 10*3/uL (ref 150–400)
Platelets: 241 10*3/uL (ref 150–400)
RBC: 3.6 MIL/uL — ABNORMAL LOW (ref 3.87–5.11)
RBC: 3.71 MIL/uL — ABNORMAL LOW (ref 3.87–5.11)
RDW: 12.7 % (ref 11.5–15.5)
RDW: 13.1 % (ref 11.5–15.5)
WBC: 18.1 10*3/uL — ABNORMAL HIGH (ref 4.0–10.5)
WBC: 19.8 10*3/uL — ABNORMAL HIGH (ref 4.0–10.5)
nRBC: 0 % (ref 0.0–0.2)
nRBC: 0 % (ref 0.0–0.2)

## 2023-04-04 LAB — MRSA NEXT GEN BY PCR, NASAL: MRSA by PCR Next Gen: NOT DETECTED

## 2023-04-04 LAB — POCT ACTIVATED CLOTTING TIME
Activated Clotting Time: 325 s
Activated Clotting Time: 279 s

## 2023-04-04 LAB — APTT: aPTT: 82 s — ABNORMAL HIGH (ref 24–36)

## 2023-04-04 SURGERY — CORONARY STENT INTERVENTION
Anesthesia: LOCAL

## 2023-04-04 MED ORDER — VERAPAMIL HCL 2.5 MG/ML IV SOLN
INTRAVENOUS | Status: AC
  Filled 2023-04-04: qty 2

## 2023-04-04 MED ORDER — NITROGLYCERIN 1 MG/10 ML FOR IR/CATH LAB
INTRA_ARTERIAL | Status: AC
  Filled 2023-04-04: qty 10

## 2023-04-04 MED ORDER — DIPHENHYDRAMINE HCL 25 MG PO CAPS: 50.0000 mg | ORAL_CAPSULE | Freq: Once | ORAL | Status: AC

## 2023-04-04 MED ORDER — FENTANYL CITRATE (PF) 100 MCG/2ML IJ SOLN
INTRAMUSCULAR | Status: DC | PRN
  Administered 2023-04-04: 25 ug via INTRAVENOUS

## 2023-04-04 MED ORDER — METHYLPREDNISOLONE SODIUM SUCC 40 MG IJ SOLR
40.0000 mg | INTRAMUSCULAR | Status: AC
  Administered 2023-04-04 (×3): 40 mg via INTRAVENOUS
  Filled 2023-04-04 (×3): qty 1

## 2023-04-04 MED ORDER — HEPARIN SODIUM (PORCINE) 1000 UNIT/ML IJ SOLN
INTRAMUSCULAR | Status: DC | PRN
  Administered 2023-04-04: 3000 [IU] via INTRAVENOUS
  Administered 2023-04-04: 8500 [IU] via INTRAVENOUS

## 2023-04-04 MED ORDER — SODIUM CHLORIDE 0.9% FLUSH: 3.0000 mL | INTRAVENOUS | Status: DC | PRN

## 2023-04-04 MED ORDER — FENTANYL CITRATE (PF) 100 MCG/2ML IJ SOLN
INTRAMUSCULAR | Status: AC
  Filled 2023-04-04: qty 2

## 2023-04-04 MED ORDER — SODIUM CHLORIDE 0.9 % IV SOLN: INTRAVENOUS | Status: DC

## 2023-04-04 MED ORDER — DIPHENHYDRAMINE HCL 50 MG/ML IJ SOLN
50.0000 mg | Freq: Once | INTRAMUSCULAR | Status: AC
  Administered 2023-04-04: 50 mg via INTRAVENOUS
  Filled 2023-04-04: qty 1

## 2023-04-04 MED ORDER — HEPARIN (PORCINE) IN NACL 1000-0.9 UT/500ML-% IV SOLN
INTRAVENOUS | Status: DC | PRN
  Administered 2023-04-04 (×2): 500 mL

## 2023-04-04 MED ORDER — HEPARIN SODIUM (PORCINE) 1000 UNIT/ML IJ SOLN
INTRAMUSCULAR | Status: AC
  Filled 2023-04-04: qty 10

## 2023-04-04 MED ORDER — CLOPIDOGREL BISULFATE 75 MG PO TABS
600.0000 mg | ORAL_TABLET | Freq: Once | ORAL | Status: AC
  Administered 2023-04-04: 600 mg via ORAL
  Filled 2023-04-04: qty 8

## 2023-04-04 MED ORDER — SODIUM CHLORIDE 0.9 % IV SOLN: INTRAVENOUS | Status: AC

## 2023-04-04 MED ORDER — CLOPIDOGREL BISULFATE 75 MG PO TABS
75.0000 mg | ORAL_TABLET | Freq: Every day | ORAL | Status: DC
  Administered 2023-04-05: 75 mg via ORAL
  Filled 2023-04-04: qty 1

## 2023-04-04 MED ORDER — HYDRALAZINE HCL 20 MG/ML IJ SOLN: 10.0000 mg | INTRAMUSCULAR | Status: AC | PRN

## 2023-04-04 MED ORDER — LABETALOL HCL 5 MG/ML IV SOLN
10.0000 mg | INTRAVENOUS | Status: AC | PRN
  Administered 2023-04-04 (×2): 10 mg via INTRAVENOUS
  Filled 2023-04-04 (×2): qty 4

## 2023-04-04 MED ORDER — IOHEXOL 350 MG/ML SOLN
INTRAVENOUS | Status: DC | PRN
  Administered 2023-04-04: 85 mL

## 2023-04-04 MED ORDER — LIDOCAINE HCL (PF) 1 % IJ SOLN
INTRAMUSCULAR | Status: AC
  Filled 2023-04-04: qty 30

## 2023-04-04 MED ORDER — ASPIRIN 81 MG PO CHEW
81.0000 mg | CHEWABLE_TABLET | ORAL | Status: AC
  Administered 2023-04-04: 81 mg via ORAL
  Filled 2023-04-04: qty 1

## 2023-04-04 MED ORDER — SODIUM CHLORIDE 0.9 % IV SOLN
250.0000 mL | INTRAVENOUS | Status: DC | PRN
Start: 2023-04-05 — End: 2023-04-05

## 2023-04-04 MED ORDER — MIDAZOLAM HCL 2 MG/2ML IJ SOLN
INTRAMUSCULAR | Status: AC
  Filled 2023-04-04: qty 2

## 2023-04-04 MED ORDER — APIXABAN 2.5 MG PO TABS
2.5000 mg | ORAL_TABLET | Freq: Two times a day (BID) | ORAL | Status: DC
  Administered 2023-04-05: 2.5 mg via ORAL
  Filled 2023-04-04: qty 1

## 2023-04-04 MED ORDER — SODIUM CHLORIDE 0.9% FLUSH
3.0000 mL | Freq: Two times a day (BID) | INTRAVENOUS | Status: DC
  Administered 2023-04-04 – 2023-04-05 (×2): 3 mL via INTRAVENOUS

## 2023-04-04 MED ORDER — VERAPAMIL HCL 2.5 MG/ML IV SOLN
INTRAVENOUS | Status: DC | PRN
  Administered 2023-04-04: 10 mL via INTRA_ARTERIAL

## 2023-04-04 MED ORDER — NITROGLYCERIN 1 MG/10 ML FOR IR/CATH LAB
INTRA_ARTERIAL | Status: DC | PRN
  Administered 2023-04-04 (×4): 200 ug via INTRACORONARY

## 2023-04-04 MED ORDER — MIDAZOLAM HCL 2 MG/2ML IJ SOLN
INTRAMUSCULAR | Status: DC | PRN
  Administered 2023-04-04: 2 mg via INTRAVENOUS

## 2023-04-04 MED ORDER — LIDOCAINE HCL (PF) 1 % IJ SOLN
INTRAMUSCULAR | Status: DC | PRN
  Administered 2023-04-04: 2 mL

## 2023-04-04 MED FILL — Verapamil HCl IV Soln 2.5 MG/ML: INTRAVENOUS | Qty: 2 | Status: AC

## 2023-04-04 MED FILL — Nitroglycerin IV Soln 100 MCG/ML in D5W: INTRA_ARTERIAL | Qty: 10 | Status: AC

## 2023-04-04 SURGICAL SUPPLY — 28 items
BALL SAPPHIRE NC24 2.75X15 (BALLOONS) ×1
BALL SAPPHIRE NC24 3.5X15 (BALLOONS) ×1
BALLN EMERGE MR 3.5X15 (BALLOONS) ×1
BALLN SAPPHIRE 2.5X15 (BALLOONS) ×1
BALLOON EMERGE MR 3.5X15 (BALLOONS) IMPLANT
BALLOON SAPPHIRE 2.5X15 (BALLOONS) IMPLANT
BALLOON SAPPHIRE NC24 2.75X15 (BALLOONS) IMPLANT
BALLOON SAPPHIRE NC24 3.5X15 (BALLOONS) IMPLANT
CATH LAUNCHER 6FR AR2 (CATHETERS) IMPLANT
CATH LAUNCHER 6FR JR4 (CATHETERS) IMPLANT
CATH VISTA GUIDE 6FR AR1 (CATHETERS) ×1
CATH VISTA GUIDE 6FR AR1 MULPK (CATHETERS) IMPLANT
CATH VISTA GUIDE 6FR JL4 (CATHETERS) IMPLANT
DEVICE RAD COMP TR BAND LRG (VASCULAR PRODUCTS) IMPLANT
GLIDESHEATH SLEND SS 6F .021 (SHEATH) IMPLANT
GUIDEWIRE INQWIRE 1.5J.035X260 (WIRE) IMPLANT
INQWIRE 1.5J .035X260CM (WIRE) ×1
KIT ENCORE 26 ADVANTAGE (KITS) IMPLANT
KIT HEMO VALVE WATCHDOG (MISCELLANEOUS) IMPLANT
PACK CARDIAC CATHETERIZATION (CUSTOM PROCEDURE TRAY) ×2 IMPLANT
SET ATX-X65L (MISCELLANEOUS) IMPLANT
STENT SYNERGY XD 2.50X32 (Permanent Stent) IMPLANT
STENT SYNERGY XD 3.50X20 (Permanent Stent) IMPLANT
SYNERGY XD 2.50X32 (Permanent Stent) ×1 IMPLANT
SYNERGY XD 3.50X20 (Permanent Stent) ×1 IMPLANT
WIRE ASAHI PROWATER 180CM (WIRE) IMPLANT
WIRE ASAHI PROWATER 300CM (WIRE) IMPLANT
WIRE RUNTHROUGH .014X300CM (WIRE) IMPLANT

## 2023-04-04 NOTE — Care Management Important Message (Signed)
Important Message  Patient Details  Name: Terry Hanson MRN: [id number] Date of Birth: 1940-09-22   Important Message Given:  Yes - Medicare IM     Dorena Bodo 04/04/2023, 1:43 PM

## 2023-04-04 NOTE — H&P (View-Only) (Signed)
Patient Name: Terry Hanson Date of Encounter: 04/04/2023 [facility] Cardiologist: Marjo Bicker, MD   Interval Summary  .    Tired this morning, no rest last night. Chronic back pain, but no chest pain.  Vital Signs .    Vitals:   04/03/23 2242 04/04/23 0300 04/04/23 0400 04/04/23 0804  BP: (!) 104/58 (!) 118/58 (!) 108/58 (!) 117/57  Pulse:  (!) 103 99 99  Resp: 20 15 19  (!) 21  Temp: 97.6 F (36.4 C)  97.7 F (36.5 C) 97.8 F (36.6 C)  TempSrc: Oral  Oral Oral  SpO2: 98% 96% 93% 96%  Weight: 86.3 kg     Height: 5\' 2"  (1.575 m)       Intake/Output Summary (Last 24 hours) at 04/04/2023 0941 Last data filed at 04/04/2023 0000 Gross per 24 hour  Intake 291.44 ml  Output --  Net 291.44 ml      04/03/2023   10:42 PM 04/03/2023    4:40 AM 04/01/2023    6:39 PM  Last 3 Weights  Weight (lbs) 190 lb 4.1 oz 188 lb 7.9 oz 187 lb 6.3 oz  Weight (kg) 86.3 kg 85.5 kg 85 kg      Telemetry/ECG    Atrial flutter, rates 90-100s - Personally Reviewed  Physical Exam .    GEN: No acute distress.   Neck: No JVD Cardiac: Irreg Irreg, soft systolic murmur, no rubs, or gallops.  Respiratory: Clear to auscultation bilaterally. GI: Soft, nontender, non-distended  MS: No edema Skin: right radial cath site stable, left femoral site with bruising but no hematoma  Assessment & Plan .     83 y.o. 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  NSTEMI CAD -- s/p STEMI in 06/2019 with DES to Proximal LAD -- Presented with  chest pain and dizziness, was initially in SVT by EMS report but in atrial flutter with RVR upon arrival to the ED. Converted back to SR while at [facility] -- hsTn [id number] -- underwent cardiac cath 1/15 with attempts at right radial access (artery loop), then left femoral access with development of hematoma, Therefore case stopped with plans for left radial access today for possible RCA intervention. Hold on antiplatelet load until intervention with difficult access issues yesterday. -- remains on IV heparin, crestor, Toprol   Chronic HFrEF -- EF 20-25% by echo in 11/2022 -- Continue Toprol-XL 50 mg daily and Digoxin 0.0625 mg MWF.  -- holding Lasix and Spironolactone with plans for cardiac cath  -- Previously on Entresto but this was discontinued given variable renal function. Would defer SGLT2i given body habitus   Paroxysmal Atrial Fibrillation/Flutter -- s/p ablation in 03/2021 with recurrence and not to be a candidate for repeat ablation due to obesity and age. Previously intolerant to Amiodarone.  -- in atrial flutter with RVR upon admission, converted to sinus rhythm. Back in atrial flutter rates 90-100 -- She reported being very symptomatic when in atrial flutter with RVR and by review of EP notes, this has led to two  inappropriate ICD shocks in the past. May need to have EP weigh in on things -- Continue Toprol-XL and Digoxin for now.  -- On Eliquis PTA, IV heparin for now   VT -- s/p St. Jude ICD placement. Followed by Dr. Ladona Ridgel.    Aortic Stenosis --This was mild to moderate by echo in 11/2022. Continue to follow as an outpatient.    Stage 3 CKD -- Baseline Cr recently in the 1.8-2.0 range, 1.9 today -- follow   Leukocytosis -- WBC 18, but in the setting of steroid use for dye allergy. No infectious symptoms  For questions or updates, please contact [facility] Please consult [email or web address] for contact info under        Signed, Laverda Page, NP    ATTENDING ATTESTATION:  After conducting a review of all available clinical information with the care team, interviewing the patient, and performing a physical exam, I agree with the findings and plan described in this note.   GEN: No acute distress.   HEENT:  MMM, no JVD, no scleral icterus Cardiac: RRR, no murmurs, rubs, or gallops.  Respiratory: Clear to auscultation bilaterally. GI: Soft, nontender, non-distended  MS: No edema; No deformity. Neuro:  Nonfocal  Vasc:  +2 radial pulses  Patient with left groin soreness and bruising without hematoma.  Discussed with patient, will refer for PCI Lcx and RCA via L radial approach.  600mg  plavix now.   Patient is now in atrial flutter, will d/w EP after coronary disease has been addressed.  Will optimize HF regimen and plan low dose losartan tomorrow.  Not a candidate for SGL2i given candidiasis of femoral region.    I have reviewed the risks, indications, and alternatives to cardiac catheterization, possible angioplasty, and stenting with the patient. Risks include but are not limited to bleeding, infection, vascular injury, stroke, myocardial infection, arrhythmia, kidney injury, radiation-related injury in the case of prolonged fluoroscopy use, emergency cardiac surgery, and death. The patient understands the risks of serious complication is 1-2 in 1000 with diagnostic cardiac cath and 1-2% or less with angioplasty/stenting.    Alverda Skeans, MD Pager [phone]

## 2023-04-04 NOTE — Progress Notes (Signed)
PHARMACY - ANTICOAGULATION CONSULT NOTE  Pharmacy Consult for IV heparin  Indication: chest pain/ACS  Allergies  Allergen Reactions   Iodinated Contrast Media Shortness Of Breath    Per Patient- The last time she had contrast during a scan she had difficulty breathing   Codeine Hives   Shrimp [Shellfish Allergy] Hives and Other (See Comments)    Broke out   Amiodarone Nausea And Vomiting   Penicillins Itching and Rash    swelling of the face/tongue/throat, SOB, or low BP sudden or severe rash/hives, skin peeling, or any reaction on the inside of your mouth or nose    Sulfa Antibiotics Itching    Patient Measurements: Height: 5\' 2"  (157.5 cm) Weight: 86.3 kg (190 lb 4.1 oz) IBW/kg (Calculated) : 50.1 Heparin Dosing Weight: 69.3 kg  Vital Signs: Temp: 98.4 F (36.9 C) (01/16 1559) Temp Source: Oral (01/16 1559) BP: 109/52 (01/16 1559) Pulse Rate: 81 (01/16 1559)  Labs: Recent Labs    04/01/23 1920 04/01/23 2119 04/01/23 2341 04/02/23 0526 04/02/23 1443 04/03/23 0313 04/03/23 0433 04/04/23 0155 04/04/23 1455 04/04/23 1500  HGB 12.4  --    < > 12.0  --  12.5 11.9* 11.6*  --   --   HCT 37.5  --    < > 36.7  --  37.3 35.7* 35.0*  --   --   PLT 200  --    < > 201  --  212 191 233  --   --   APTT  --   --    < > 70* 81* 50*  --   --   --  82*  HEPARINUNFRC  --   --   --  >1.10*  --  0.73*  --   --  0.66  --   CREATININE 1.82*  --    < >  --   --  1.98* 1.99* 1.97*  --   --   TROPONINIHS 196* 2,319*  --  11,376*  --   --   --   --   --   --    < > = values in this interval not displayed.    Estimated Creatinine Clearance: 22.8 mL/min (A) (by C-G formula based on SCr of 1.97 mg/dL (H)).  Medical History: Past Medical History:  Diagnosis Date   Arthritis    Asthma    Chronic HFrEF (heart failure with reduced ejection fraction) (HCC)    Chronic kidney disease, stage 3b (HCC)    Diabetes mellitus (HCC)    Hypercholesterolemia    Hyperlipidemia    Ischemic  cardiomyopathy    STEMI (ST elevation myocardial infarction) (HCC)    a. 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 year old female admitted on 04/01/2023 with concern for ACS. Eliquis prior to admission (last dose 1/13 AM). Pharmacy consulted to dose heparin.   S/p cath 1/15, intervention aborted d/t risk of groin hematoma. Plan to restart heparin 8hr after sheath. Sheath removed at 21:30 (After ACT < 170). Planning intervention through radial 1/16.   1/16: Heparin restarted @0639  > 8 hour aPTT 82 sec, anti-Xa 0.66 appear to be correlating on 1100 units/hr.  No issues noted.  Pending cath this afternoon.  Goal of Therapy:  APTT 66-102 Heparin level 0.3-0.7 units/ml Monitor platelets by anticoagulation protocol: Yes   Plan:  Continue heparin to 1100 units/hr Monitor daily aPTT, heparin level, CBC, signs/symptoms of bleeding  F/u after  cath   Thank you for allowing pharmacy to participate in this patient's care.  Trixie Rude, PharmD Clinical Pharmacist 04/04/2023  4:35 PM

## 2023-04-04 NOTE — Progress Notes (Addendum)
Patient Name: Terry Hanson Date of Encounter: 04/04/2023 [facility] Cardiologist: Marjo Bicker, MD   Interval Summary  .    Tired this morning, no rest last night. Chronic back pain, but no chest pain.  Vital Signs .    Vitals:   04/03/23 2242 04/04/23 0300 04/04/23 0400 04/04/23 0804  BP: (!) 104/58 (!) 118/58 (!) 108/58 (!) 117/57  Pulse:  (!) 103 99 99  Resp: 20 15 19  (!) 21  Temp: 97.6 F (36.4 C)  97.7 F (36.5 C) 97.8 F (36.6 C)  TempSrc: Oral  Oral Oral  SpO2: 98% 96% 93% 96%  Weight: 86.3 kg     Height: 5\' 2"  (1.575 m)       Intake/Output Summary (Last 24 hours) at 04/04/2023 0941 Last data filed at 04/04/2023 0000 Gross per 24 hour  Intake 291.44 ml  Output --  Net 291.44 ml      04/03/2023   10:42 PM 04/03/2023    4:40 AM 04/01/2023    6:39 PM  Last 3 Weights  Weight (lbs) 190 lb 4.1 oz 188 lb 7.9 oz 187 lb 6.3 oz  Weight (kg) 86.3 kg 85.5 kg 85 kg      Telemetry/ECG    Atrial flutter, rates 90-100s - Personally Reviewed  Physical Exam .    GEN: No acute distress.   Neck: No JVD Cardiac: Irreg Irreg, soft systolic murmur, no rubs, or gallops.  Respiratory: Clear to auscultation bilaterally. GI: Soft, nontender, non-distended  MS: No edema Skin: right radial cath site stable, left femoral site with bruising but no hematoma  Assessment & Plan .     83 y.o. 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  NSTEMI CAD -- s/p STEMI in 06/2019 with DES to Proximal LAD -- Presented with  chest pain and dizziness, was initially in SVT by EMS report but in atrial flutter with RVR upon arrival to the ED. Converted back to SR while at [facility] -- hsTn 196>>[id number] -- underwent cardiac cath 1/15 with attempts at right radial access (artery loop), then left femoral access with development of hematoma, Therefore case stopped with plans for left radial access today for possible RCA intervention. Hold on antiplatelet load until intervention with difficult access issues yesterday. -- remains on IV heparin, crestor, Toprol   Chronic HFrEF -- EF 20-25% by echo in 11/2022 -- Continue Toprol-XL 50 mg daily and Digoxin 0.0625 mg MWF.  -- holding Lasix and Spironolactone with plans for cardiac cath  -- Previously on Entresto but this was discontinued given variable renal function. Would defer SGLT2i given body habitus   Paroxysmal Atrial Fibrillation/Flutter -- s/p ablation in 03/2021 with recurrence and not to be a candidate for repeat ablation due to obesity and age. Previously intolerant to Amiodarone.  -- in atrial flutter with RVR upon admission, converted to sinus rhythm. Back in atrial flutter rates 90-100 -- She reported being very symptomatic when in atrial flutter with RVR and by review of EP notes, this has led to two  inappropriate ICD shocks in the past. May need to have EP weigh in on things -- Continue Toprol-XL and Digoxin for now.  -- On Eliquis PTA, IV heparin for now   VT -- s/p St. Jude ICD placement. Followed by Dr. Ladona Ridgel.    Aortic Stenosis --This was mild to moderate by echo in 11/2022. Continue to follow as an outpatient.    Stage 3 CKD -- Baseline Cr recently in the 1.8-2.0 range, 1.9 today -- follow   Leukocytosis -- WBC 18, but in the setting of steroid use for dye allergy. No infectious symptoms  For questions or updates, please contact [facility] Please consult [email or web address] for contact info under        Signed, Laverda Page, NP    ATTENDING ATTESTATION:  After conducting a review of all available clinical information with the care team, interviewing the patient, and performing a physical exam, I agree with the findings and plan described in this note.   GEN: No acute distress.   HEENT:  MMM, no JVD, no scleral icterus Cardiac: RRR, no murmurs, rubs, or gallops.  Respiratory: Clear to auscultation bilaterally. GI: Soft, nontender, non-distended  MS: No edema; No deformity. Neuro:  Nonfocal  Vasc:  +2 radial pulses  Patient with left groin soreness and bruising without hematoma.  Discussed with patient, will refer for PCI Lcx and RCA via L radial approach.  600mg  plavix now.   Patient is now in atrial flutter, will d/w EP after coronary disease has been addressed.  Will optimize HF regimen and plan low dose losartan tomorrow.  Not a candidate for SGL2i given candidiasis of femoral region.    I have reviewed the risks, indications, and alternatives to cardiac catheterization, possible angioplasty, and stenting with the patient. Risks include but are not limited to bleeding, infection, vascular injury, stroke, myocardial infection, arrhythmia, kidney injury, radiation-related injury in the case of prolonged fluoroscopy use, emergency cardiac surgery, and death. The patient understands the risks of serious complication is 1-2 in 1000 with diagnostic cardiac cath and 1-2% or less with angioplasty/stenting.    Alverda Skeans, MD Pager [phone]

## 2023-04-04 NOTE — Interval H&P Note (Signed)
History and Physical Interval Note:  04/04/2023 5:40 PM  Terry Hanson  has presented today for surgery, with the diagnosis of coronary artery disease.  The various methods of treatment have been discussed with the patient and family. After consideration of risks, benefits and other options for treatment, the patient has consented to  Procedure(s): CORONARY STENT INTERVENTION (N/A) as a surgical intervention.  The patient's history has been reviewed, patient examined, no change in status, stable for surgery.  I have reviewed the patient's chart and labs.  Questions were answered to the patient's satisfaction.     Kashtyn Jankowski J Terin Dierolf

## 2023-04-04 NOTE — Progress Notes (Signed)
Mobility Specialist Progress Note;   04/04/23 1335  Mobility  Activity Ambulated with assistance in hallway  Level of Assistance Contact guard assist, steadying assist  Assistive Device Front wheel walker  Distance Ambulated (ft) 150 ft  Activity Response Tolerated well  Mobility Referral Yes  Mobility visit 1 Mobility  Mobility Specialist Start Time (ACUTE ONLY) 1335  Mobility Specialist Stop Time (ACUTE ONLY) 1355  Mobility Specialist Time Calculation (min) (ACUTE ONLY) 20 min   Pt agreeable to mobility w/ encouragement. Stated she has not been able to rest much. Required MinG for bed mobility and during ambulation for safety. Displayed a cough throughout session. C/o back pain as well. VSS throughout session. Requested assistance to BR at EOS, void successful. Pt returned back to bed with all needs met, alarm on.   Caesar Bookman Mobility Specialist Please contact via SecureChat or [employer] [phone]

## 2023-04-05 ENCOUNTER — Inpatient Hospital Stay (HOSPITAL_COMMUNITY): Payer: No Typology Code available for payment source

## 2023-04-05 ENCOUNTER — Other Ambulatory Visit (HOSPITAL_COMMUNITY): Payer: Self-pay

## 2023-04-05 ENCOUNTER — Other Ambulatory Visit: Payer: Self-pay | Admitting: Physician Assistant

## 2023-04-05 ENCOUNTER — Encounter (HOSPITAL_COMMUNITY): Payer: Self-pay | Admitting: Cardiology

## 2023-04-05 DIAGNOSIS — I214 Non-ST elevation (NSTEMI) myocardial infarction: Secondary | ICD-10-CM | POA: Diagnosis not present

## 2023-04-05 DIAGNOSIS — I249 Acute ischemic heart disease, unspecified: Secondary | ICD-10-CM | POA: Diagnosis present

## 2023-04-05 DIAGNOSIS — I255 Ischemic cardiomyopathy: Secondary | ICD-10-CM

## 2023-04-05 DIAGNOSIS — I5021 Acute systolic (congestive) heart failure: Secondary | ICD-10-CM | POA: Diagnosis not present

## 2023-04-05 DIAGNOSIS — I200 Unstable angina: Secondary | ICD-10-CM | POA: Diagnosis present

## 2023-04-05 LAB — ECHOCARDIOGRAM COMPLETE
AR max vel: 1.18 cm2
AV Area VTI: 1.23 cm2
AV Area mean vel: 1.11 cm2
AV Mean grad: 7 mm[Hg]
AV Peak grad: 12.1 mm[Hg]
Ao pk vel: 1.74 m/s
Area-P 1/2: 4.12 cm2
Calc EF: 34.8 %
Height: 62 in
S' Lateral: 4.4 cm
Single Plane A2C EF: 31.5 %
Single Plane A4C EF: 35 %
Weight: 3044.11 [oz_av]

## 2023-04-05 LAB — BASIC METABOLIC PANEL
Anion gap: 11 (ref 5–15)
BUN: 45 mg/dL — ABNORMAL HIGH (ref 8–23)
CO2: 20 mmol/L — ABNORMAL LOW (ref 22–32)
Calcium: 9.1 mg/dL (ref 8.9–10.3)
Chloride: 106 mmol/L (ref 98–111)
Creatinine, Ser: 2.65 mg/dL — ABNORMAL HIGH (ref 0.44–1.00)
GFR, Estimated: 18 mL/min — ABNORMAL LOW (ref >60)
Glucose, Bld: 144 mg/dL — ABNORMAL HIGH (ref 70–99)
Potassium: 4.1 mmol/L (ref 3.5–5.1)
Sodium: 137 mmol/L (ref 135–145)

## 2023-04-05 LAB — URINALYSIS, ROUTINE W REFLEX MICROSCOPIC
Bilirubin Urine: NEGATIVE
Glucose, UA: NEGATIVE mg/dL
Hgb urine dipstick: NEGATIVE
Ketones, ur: NEGATIVE mg/dL
Leukocytes,Ua: NEGATIVE
Nitrite: NEGATIVE
Protein, ur: NEGATIVE mg/dL
Specific Gravity, Urine: 1.041 — ABNORMAL HIGH (ref 1.005–1.030)
pH: 5 (ref 5.0–8.0)

## 2023-04-05 LAB — APTT: aPTT: 31 s (ref 24–36)

## 2023-04-05 LAB — LIPOPROTEIN A (LPA): Lipoprotein (a): 218.2 nmol/L — ABNORMAL HIGH (ref <75.0)

## 2023-04-05 MED ORDER — FUROSEMIDE 20 MG PO TABS
20.0000 mg | ORAL_TABLET | Freq: Every day | ORAL | Status: DC
  Administered 2023-04-05: 20 mg via ORAL
  Filled 2023-04-05: qty 1

## 2023-04-05 MED ORDER — PERFLUTREN LIPID MICROSPHERE
1.0000 mL | INTRAVENOUS | Status: AC | PRN
  Administered 2023-04-05: 3 mL via INTRAVENOUS

## 2023-04-05 MED ORDER — NITROGLYCERIN 0.4 MG SL SUBL
0.4000 mg | SUBLINGUAL_TABLET | SUBLINGUAL | 3 refills | Status: AC | PRN
  Filled 2023-04-05: qty 25, 7d supply, fill #0

## 2023-04-05 MED ORDER — ASPIRIN 81 MG PO TBEC
81.0000 mg | DELAYED_RELEASE_TABLET | Freq: Every day | ORAL | 0 refills | Status: AC
  Filled 2023-04-05: qty 30, 30d supply, fill #0

## 2023-04-05 MED ORDER — SPIRONOLACTONE 25 MG PO TABS
25.0000 mg | ORAL_TABLET | Freq: Every day | ORAL | Status: DC
  Administered 2023-04-05: 25 mg via ORAL
  Filled 2023-04-05: qty 1

## 2023-04-05 MED ORDER — CLOPIDOGREL BISULFATE 75 MG PO TABS
75.0000 mg | ORAL_TABLET | Freq: Every day | ORAL | 3 refills | Status: AC
  Filled 2023-04-05: qty 90, 90d supply, fill #0

## 2023-04-05 MED ORDER — ATORVASTATIN CALCIUM 80 MG PO TABS
80.0000 mg | ORAL_TABLET | Freq: Every day | ORAL | 3 refills | Status: DC
  Filled 2023-04-05: qty 90, 90d supply, fill #0

## 2023-04-05 MED FILL — Midazolam HCl Inj 2 MG/2ML (Base Equivalent): INTRAMUSCULAR | Qty: 1 | Status: AC

## 2023-04-05 NOTE — Progress Notes (Signed)
Patient Name: Terry Hanson Date of Encounter: 04/05/2023 [facility] Cardiologist: Marjo Bicker, MD   Interval Summary  .    PCI of RCA and Lcx yesterday via L radial approach without issues.  No acute events overnight.  Vital Signs .    Vitals:   04/05/23 0100 04/05/23 0115 04/05/23 0220 04/05/23 0801  BP:   (!) 177/82 (!) 157/85  Pulse: 74 76 84 75  Resp: (!) 21 17 16 20   Temp:   98.2 F (36.8 C) 97.6 F (36.4 C)  TempSrc:   Oral Oral  SpO2: 98% 97% 97% 97%  Weight:      Height:        Intake/Output Summary (Last 24 hours) at 04/05/2023 0826 Last data filed at 04/05/2023 0809 Gross per 24 hour  Intake 403 ml  Output 200 ml  Net 203 ml      04/03/2023   10:42 PM 04/03/2023    4:40 AM 04/01/2023    6:39 PM  Last 3 Weights  Weight (lbs) 190 lb 4.1 oz 188 lb 7.9 oz 187 lb 6.3 oz  Weight (kg) 86.3 kg 85.5 kg 85 kg      Telemetry/ECG    SR - Personally Reviewed  Physical Exam .    GEN: No acute distress.   Neck: No JVD Cardiac:  RRR, soft systolic murmur Respiratory: Mild end expiratory wheezes at bilateraly bases GI: Soft, nontender, non-distended  MS: No edema Skin: L radial site CDI, left femoral site with bruising but no hematoma  Assessment & Plan .     83 y.o. 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  NSTEMI CAD -- s/p STEMI in 06/2019 with DES to Proximal LAD -- s/p PCI RCA and Lcx yesterday --Triple therapy x 1 month then plavix and Eliquis x 1 year (ACS) then Eliquis  indefinitely   Chronic HFrEF -- EF 20-25% by echo in 11/2022 -- Continue Toprol-XL 50 mg daily and Digoxin 0.0625 mg MWF.  -- Start lasix 20mg  qday and Spironolactone 25mg ; check BMP next week -- LVEDP on cath yesterday --Previously on Entresto but this was discontinued given variable renal function. Would defer SGLT2i given body habitus   Paroxysmal Atrial Fibrillation/Flutter -- s/p ablation in 03/2021 with recurrence and not to be a candidate for repeat ablation due to obesity and age. Previously intolerant to Amiodarone.  -- in atrial flutter with RVR upon admission, converted to sinus rhythm. Back in atrial flutter rates 90-100 -- In NSR today, continue Toprol-XL and Digoxin for now.  -- On Eliquis 2.64mb BID and antiplatelets as above    VT -- s/p St. Jude ICD placement. Followed by Dr. Ladona Ridgel.    Aortic Stenosis --This was mild to moderate by echo in 11/2022. Continue to follow as an outpatient.    Stage 3 CKD -- Cr stable today -- Check BMP next week  Asthma --Mild wheezing today.  Her symptoms are infrequent but present today.  Will d/w pharmacy.  Disposition: --D/C today with BMP next week  and close hospital follow up.  For questions or updates, please contact [facility] Please consult [email or web address] for contact info under         Alverda Skeans, MD Pager [phone]

## 2023-04-05 NOTE — Consult Note (Signed)
[facility] Liaison Consult Note   04/05/2023  JAYELYNN PETERSHEIM 04-02-1940 [id number]  Insurance: [facility]   Primary Care Provider: Pcp, No Spoke with inpatient [facility] RN regarding follow up needs and patient was offered PCP, patient for new patient appointment with [facility] 05/16/23 noted.   RN Hospital Liaison screened the patient remotely at [facility].    The patient was screened for hospitalization  with noted high risk score for unplanned readmission risk 3 hospital admissions in 6 months.  The patient was assessed for potential Community Care Coordination service needs for post hospital transition for care coordination. Review of patient's electronic medical record reveals patient is admitted with NSTEMI. No other needs assessed.  Plan:  Patient to establish with PCP.   VBCI Community Care, Population Health does not replace or interfere with any arrangements made by the Inpatient Transition of Care team.   For questions contact:   Charlesetta Shanks, RN, BSN, CCM   [facility] Liaison Direct Dial: [phone] or secure chat Email: [email or web address]

## 2023-04-05 NOTE — Plan of Care (Signed)
  Problem: Education: Goal: Knowledge of General Education information will improve Description: Including pain rating scale, medication(s)/side effects and non-pharmacologic comfort measures Outcome: Progressing   Problem: Health Behavior/Discharge Planning: Goal: Ability to manage health-related needs will improve Outcome: Progressing   Problem: Clinical Measurements: Goal: Ability to maintain clinical measurements within normal limits will improve Outcome: Progressing Goal: Will remain free from infection Outcome: Progressing Goal: Diagnostic test results will improve Outcome: Progressing Goal: Respiratory complications will improve Outcome: Progressing Goal: Cardiovascular complication will be avoided Outcome: Progressing   Problem: Activity: Goal: Risk for activity intolerance will decrease Outcome: Progressing   Problem: Nutrition: Goal: Adequate nutrition will be maintained Outcome: Progressing   Problem: Coping: Goal: Level of anxiety will decrease Outcome: Progressing   Problem: Elimination: Goal: Will not experience complications related to bowel motility Outcome: Progressing Goal: Will not experience complications related to urinary retention Outcome: Progressing   Problem: Pain Management: Goal: General experience of comfort will improve Outcome: Progressing   Problem: Safety: Goal: Ability to remain free from injury will improve Outcome: Progressing   Problem: Skin Integrity: Goal: Risk for impaired skin integrity will decrease Outcome: Progressing   Problem: Education: Goal: Understanding of cardiac disease, CV risk reduction, and recovery process will improve Outcome: Progressing Goal: Individualized Educational Video(s) Outcome: Progressing   Problem: Activity: Goal: Ability to tolerate increased activity will improve Outcome: Progressing   Problem: Cardiac: Goal: Ability to achieve and maintain adequate cardiovascular perfusion will  improve Outcome: Progressing   Problem: Health Behavior/Discharge Planning: Goal: Ability to safely manage health-related needs after discharge will improve Outcome: Progressing   Problem: Education: Goal: Knowledge of disease or condition will improve Outcome: Progressing Goal: Understanding of medication regimen will improve Outcome: Progressing Goal: Individualized Educational Video(s) Outcome: Progressing   Problem: Activity: Goal: Ability to tolerate increased activity will improve Outcome: Progressing   Problem: Cardiac: Goal: Ability to achieve and maintain adequate cardiopulmonary perfusion will improve Outcome: Progressing   Problem: Health Behavior/Discharge Planning: Goal: Ability to safely manage health-related needs after discharge will improve Outcome: Progressing   Problem: Education: Goal: Understanding of CV disease, CV risk reduction, and recovery process will improve Outcome: Progressing Goal: Individualized Educational Video(s) Outcome: Progressing   Problem: Activity: Goal: Ability to return to baseline activity level will improve Outcome: Progressing   Problem: Cardiovascular: Goal: Ability to achieve and maintain adequate cardiovascular perfusion will improve Outcome: Progressing Goal: Vascular access site(s) Level 0-1 will be maintained Outcome: Progressing   Problem: Health Behavior/Discharge Planning: Goal: Ability to safely manage health-related needs after discharge will improve Outcome: Progressing

## 2023-04-05 NOTE — Progress Notes (Signed)
Heart Failure Navigator Progress Note  Assessed for Heart & Vascular TOC clinic readiness.  Patient does not meet criteria due to [facility] appt scheduled for 1/28. More chronic CHF. EF 20-25% in 11/2022 and EF 25-30% this admission.  Navigator available for reassessment of patient.   Sharen Hones, PharmD, BCPS Heart Failure Stewardship Pharmacist Phone [phone]

## 2023-04-05 NOTE — TOC Transition Note (Signed)
Transition of Care [facility]) - Discharge Note   Patient Details  Name: Terry Hanson MRN: [id number] Date of Birth: 11-13-1940  [facility] CM/SW Contact:  Leone Haven, RN Phone Number: 04/05/2023, 12:52 PM   Clinical Narrative:    For possible dc today, has transport.  Has follow up on AVS.     Barriers to Discharge: Continued Medical Work up   Patient Goals and CMS Choice Patient states their goals for this hospitalization and ongoing recovery are:: return home   Choice offered to / list presented to : NA      Discharge Placement                       Discharge Plan and Services Additional resources added to the After Visit Summary for   In-house Referral: NA Discharge Planning Services: CM Consult Post Acute Care Choice: NA          DME Arranged: N/A DME Agency: NA       HH Arranged: NA          Social Drivers of Health (SDOH) Interventions SDOH Screenings   Food Insecurity: No Food Insecurity (04/02/2023)  Housing: High Risk (04/02/2023)  Transportation Needs: No Transportation Needs (04/02/2023)  Utilities: Not At Risk (04/02/2023)  Social Connections: Moderately Isolated (04/02/2023)  Tobacco Use: Medium Risk (04/01/2023)     Readmission Risk Interventions    04/03/2023   10:25 AM 01/09/2023    1:27 PM  Readmission Risk Prevention Plan  Transportation Screening Complete Complete  PCP or Specialist Appt within 5-7 Days  Not Complete  Home Care Screening  Complete  Medication Review (RN CM)  Complete  HRI or Home Care Consult Complete   Palliative Care Screening Not Applicable   Medication Review (RN Care Manager) Complete

## 2023-04-05 NOTE — Progress Notes (Signed)
CARDIAC REHAB PHASE I   Pt has just returned from walk in hallway with mobility. Reports tolerating well with no CP, dizziness and mild SOB towards end of walk.  Post MI/stent education including restrictions, risk factors, exercise guidelines, antiplatelet therapy importance, MI booklet, NTG use, heart healthy diet and CRP2 reviewed. All questions and concerns addressed. Will refer to AP for CRP2. Plan for discharge home later today.   [id number] Woodroe Chen, RN BSN 04/05/2023 10:44 AM

## 2023-04-05 NOTE — Discharge Summary (Addendum)
Discharge Summary    Patient ID: BEONCA NEWSUM MRN: [id number]; DOB: 1940/11/01  Admit date: 04/01/2023 Discharge date: 04/05/2023  PCP:  Pcp, No   [facility] Providers Cardiologist:  Marjo Bicker, MD  Electrophysiologist:  Lewayne Bunting, MD  \     Discharge Diagnoses    Principal Problem:   NSTEMI (non-ST elevated myocardial infarction) [facility]) Active Problems:   DM II (diabetes mellitus, type II), controlled (HCC)   Chronic combined systolic and diastolic heart failure (HCC)   Hypercholesterolemia   Atrial fibrillation with RVR (HCC)   CAD (coronary artery disease)   Stage 3 chronic kidney disease (HCC)   Acute respiratory failure with hypercapnia (HCC)   Atrial fibrillation, chronic (HCC)   ICD (implantable cardioverter-defibrillator) in place   Ventricular fibrillation (HCC)    Diagnostic Studies/Procedures    Echo 04/05/23:  1. Septal, apical and inferior wall hypokinesis . Left ventricular  ejection fraction, by estimation, is 25 to 30%. The left ventricle has  severely decreased function. The left ventricle demonstrates regional wall  motion abnormalities (see scoring  diagram/findings for description). The left ventricular internal cavity  size was moderately dilated. Left ventricular diastolic parameters are  consistent with Grade I diastolic dysfunction (impaired relaxation).  Elevated left ventricular end-diastolic  pressure.   2. Device leads in RA/RV. Right ventricular systolic function is normal.  The right ventricular size is normal. There is moderately elevated  pulmonary artery systolic pressure.   3. Left atrial size was moderately dilated.   4. The mitral valve is abnormal. Mild mitral valve regurgitation. No  evidence of mitral stenosis.   5. The aortic valve is tricuspid. There is moderate calcification of the  aortic valve. Aortic valve regurgitation is not visualized. Mild aortic  valve stenosis.   6. The inferior vena cava is  dilated in size with <50% respiratory  variability, suggesting right atrial pressure of 15 mmHg.   ______________  Coronary stent intervention 04/04/23: Coronary intervention 04/04/2023: (Please see detailed diagnostic angiogram report 04/03/2023) Successful percutaneous coronary intervention prox-mid RCA & mid LCx        PTCA and stent placement 3.5 X 20 mm Synergy drug-eluting stent mid RCA          Post dilatation using 3.75 X 15 mm Hardwick balloon up to 18 atm        PTCA and stent placement 2.5 X 32 mm Synergy drug-eluting stent mid LCx          Post dilatation using 2.75 X 15 mm Matthews balloon up to 18 atm   No residual stenosis within the stented segments.   Minimal residual 30% stenosis distal to the left circumflex stent.        Consider triple therapy with aspirin, Plavix, Eliquis for 1 month. Then, stop aspirin and continue Plavix and Eliquis for up to 1 year.   Contrast used: 85 cc Will hydrate the patient with 100 cc/hr for next 8 hours.     _____________   LHC 04/03/23: Coronary angiography 04/03/2023: LM: Normal LAD: Patent prox stent, no significant restenosis          40% mid LAD stenosis Lcx: Mid 80%, and 60% tandem stenoses on either side of OM with 80% ostial stenosis RCA: Proximal/mid eccentric 90% stenosis   LVEDP 6 mmHg      Intervention not attempted today due to access related issues. Patient hemodynamically stable.  No large hematoma at access site.   Will attempt intervention through left  radial access tomorrow.    History of Present Illness     Terry Hanson is a 83 y.o. female with past medical history of CAD (s/p STEMI in 06/2019 with DES to Proximal LAD), chronic HFrEF (EF 30-35% in 09/2019, at 20-25% by echo in 11/2022), VT (s/p [employer] ICD in place), paroxysmal atrial fibrillation (s/p ablation in 03/2021 with recurrence, not a candidate for repeat ablation due to obesity and age and previously intolerant to Amiodarone), aortic stenosis, HTN, HLD,  Stage 3 CKD and anemia who is being seen 04/02/2023 for the evaluation of an NSTEMI and atrial flutter with RVR.   Ms. Elmquist was evaluated by EP during an admission in 11/2022 as her ICD had fired. It was felt that her initial rhythm was possibly atrial fibrillation and she did have multiple electrolyte abnormalities on admission. She had received ATP which degenerated the rhythm into VF and had been shocked by her defibrillator at that time. She did have a history of inappropriate ICD shock as well for atrial fibrillation with RVR. She did follow-up with Dr. Jenene Slicker in 01/2023 and no changes were made to medications at that time. She was continued on Eliquis 2.5 mg twice daily, Digoxin 0.0625 mg 3 times weekly, Lasix 20 mg daily, Toprol-XL 50 mg daily, Simvastatin 20 mg daily and Spironolactone 25 mg daily. Sherryll Burger had previously been held during her admission given hypotension and this was not restarted at the time of her office visit.   She presented to [facility] ED 04/01/23 for evaluation of sudden dizziness and weakness. EMS reported she had been in SVT with heart rate 185 upon their arrival and she converted to atrial fibrillation prior to arriving to the ED. In talking with the patient today, she reports being in her normal state of health until yesterday evening when she developed dizziness and a funny feeling in her chest after going to the restroom. She does report having chest pressure at that time and could feel palpitations as well. Reports prior to this, she has been in her normal state of health over the past few weeks with no chest pain or significant dyspnea when doing routine activities around her home. Says that she had been without Digoxin due to her pharmacy not having this in stock but had restarted the medication at least 2 to 3 days prior to admission. Reports good compliance with Toprol-XL, Lasix and Eliquis.   Initial labs showed WBC 10.7, Hgb 12.4, platelets 200, Na+ 134, K+ 3.9 and  creatinine 1.82 (previously 2.14 in 02/2023). Mg 2.1. TSH 3.315. BNP 455. Dig Level < 0.2. Initial and repeat Hs Troponin elevated at 196 and 2319. CXR with no active cardiopulmonary disease. EKG shows atrial flutter with RVR, heart rate 126 with IVCD and ST depression along the inferior and lateral leads.   She received IV Lopressor 2.5 mg x 2 and did convert back to normal sinus rhythm. Reports feeling back to baseline this morning and denies any recent chest pain or dyspnea.  Hospital Course     Consultants: none  NSTEMI CAD  She has a hx of STEMI 06/2019 with DES-pLAD. She presented with NSTEMI with initial heart catheterization showing patent proximal stent with no significant restenosis, but mentions 40% narrowing in the mid vessel, LCX with tandem lesions of 80% and 60% on either side of OM with 80% stenosis in the ostial OM2, and 90% stenosis in the mid RCA. Due to access issues, no PCI. She was brought back to  the cath lab and treated with PTCA/DES-RCA with 3.5 x 20 mm stent. Also treated with PTCA/DES-mid LCX with 2. 5 x 32 mm stent. Minimal residual 30% stenosis in the distal LCX distal to stent will be treated medically.  She tolerated the procedure well and was placed on triple therapy x 30 days with ASA, plavix, and eliquis. After 30 days, ASA will be discontinued and she will remain on plavix and eliquis for up to 1 year.    Hyperlipidemia with LDL Goal < 70 No recent lipid panel.  LP(a) 218 She has been on 20 mg crestor, but may benefit from PCSK9i. Given renal function, will switch her to 80 mg lipitor. Consider obtaining fasting lipid panel at outpatient follow up. Will  need lipid clinic referral.   Chronic systolic and diastolic heart failure Known LVEF of 20-25% on echo 11/2022 --> now read as 25-30%. ICD in place.  - previously on entresto but this was discontinued given fluctuating renal function - SGLT2i deferred given body habitus - continue toprol and digoxin 0.0625 mg  three times weekly - will start 20 mg lasix daily and spironolactone 25 mg daily with BMP next week - LVEDP on cath was 10 mmHg - digoxin level checked in hospital   Hypertension Managed in the context of CHF, as above.    Paroxysmal atrial fibrillation/flutter She presented with flutter RVR on admission and converted to sinus rhythm. She is s/p ablation in 03/2021 with recurrence and not felt to be a candidate for repeat ablation due to obesity and age. Previously intolerant to amiodarone. Unfortunately, she reverted back to flutter. Will focus on rate control with toprol and digoxin.    Chronic anticoagulation Appropriately dosed with 2.5 mg eliquis BID for age and renal function.    Hx of VT S/p ICD in place Followed by Dr. Ladona Ridgel   Aortic stenosis Mild to moderate by echo 11/2022 Echo today showed mild aortic stenosis   Leukocytosis Cough x 5 days - wheezing on exam, known asthma - CXR with no active disease - UA no concerning for UTI - follow up with PCP   Pt seen and examined by Dr. Lynnette Caffey and deemed stable for discharge. Follow up has been arranged.  BMP in 1 week      Did the patient have an acute coronary syndrome (MI, NSTEMI, STEMI, etc) this admission?:  Yes                               AHA/ACC ACS Clinical Performance & Quality Measures: Aspirin prescribed? - Yes ADP Receptor Inhibitor (Plavix/Clopidogrel, Brilinta/Ticagrelor or Effient/Prasugrel) prescribed (includes medically managed patients)? - Yes Beta Blocker prescribed? - Yes High Intensity Statin (Lipitor 40-80mg  or Crestor 20-40mg ) prescribed? - Yes EF assessed during THIS hospitalization? - Yes For EF <40%, was ACEI/ARB prescribed? - No - Reason:  CKD For EF <40%, Aldosterone Antagonist (Spironolactone or Eplerenone) prescribed? - Yes Cardiac Rehab Phase II ordered (including medically managed patients)? - Yes       The patient will be scheduled for a TOC follow up appointment in 7-14  days.  A message has been sent to the [facility] and Scheduling Pool at the office where the patient should be seen for follow up.  _____________  Discharge Vitals Blood pressure (!) 152/61, pulse 82, temperature 98.5 F (36.9 C), temperature source Oral, resp. rate 20, height 5\' 2"  (1.575 m), weight 86.3 kg, SpO2 95%.  Filed  Weights   04/01/23 1839 04/03/23 0440 04/03/23 2242  Weight: 85 kg 85.5 kg 86.3 kg    Labs & Radiologic Studies    CBC Recent Labs    04/04/23 0155 04/04/23 2024  WBC 18.1* 19.8*  HGB 11.6* 11.9*  HCT 35.0* 36.6  MCV 97.2 98.7  PLT 233 241   Basic Metabolic Panel Recent Labs    56/21/30 0155 04/04/23 2024 04/05/23 1102  NA 136  --  137  K 4.1  --  4.1  CL 105  --  106  CO2 22  --  20*  GLUCOSE 205*  --  144*  BUN 30*  --  45*  CREATININE 1.97* 1.97* 2.65*  CALCIUM 8.9  --  9.1   Liver Function Tests No results for input(s): "AST", "ALT", "ALKPHOS", "BILITOT", "PROT", "ALBUMIN" in the last 72 hours. No results for input(s): "LIPASE", "AMYLASE" in the last 72 hours. High Sensitivity Troponin:   Recent Labs  Lab 04/01/23 1920 04/01/23 2119 04/02/23 0526  TROPONINIHS 196* 2,319* 11,376*    BNP Invalid input(s): "POCBNP" D-Dimer No results for input(s): "DDIMER" in the last 72 hours. Hemoglobin A1C No results for input(s): "HGBA1C" in the last 72 hours. Fasting Lipid Panel No results for input(s): "CHOL", "HDL", "LDLCALC", "TRIG", "CHOLHDL", "LDLDIRECT" in the last 72 hours. Thyroid Function Tests No results for input(s): "TSH", "T4TOTAL", "T3FREE", "THYROIDAB" in the last 72 hours.  Invalid input(s): "FREET3" _____________  DG Chest 2 View Result Date: 04/05/2023 CLINICAL DATA:  Wheezing. EXAM: CHEST - 2 VIEW COMPARISON:  April 01, 2023. FINDINGS: Stable cardiomediastinal silhouette. Single lead left-sided defibrillator is unchanged. No acute pulmonary disease. Bony thorax is unremarkable. IMPRESSION: No active cardiopulmonary disease.  Electronically Signed   By: Lupita Raider M.D.   On: 04/05/2023 13:11   ECHOCARDIOGRAM COMPLETE Result Date: 04/05/2023    ECHOCARDIOGRAM REPORT   Patient Name:   IYONA NANNI Arvizu Date of Exam: 04/05/2023 Medical Rec #:  [id number]   Height:       62.0 in Accession #:    [id number]  Weight:       190.3 lb Date of Birth:  08-29-1940    BSA:          1.872 m Patient Age:    82 years    BP:           177/82 mmHg Patient Gender: F           HR:           72 bpm. Exam Location:  Inpatient Procedure: 2D Echo, Cardiac Doppler, Color Doppler and Intracardiac            Opacification Agent Indications:     CHF- Acute Systolic  History:         Patient has prior history of Echocardiogram examinations, most                  recent 11/18/2023. Risk Factors:Diabetes and Dyslipidemia.  Sonographer:     Karma Ganja Referring Phys:  [id number] Lillia Abed B ROBERTS Diagnosing Phys: Charlton Haws MD  Sonographer Comments: Technically difficult study due to poor echo windows and patient is obese. IMPRESSIONS  1. Septal, apical and inferior wall hypokinesis . Left ventricular ejection fraction, by estimation, is 25 to 30%. The left ventricle has severely decreased function. The left ventricle demonstrates regional wall motion abnormalities (see scoring diagram/findings for description). The left ventricular internal cavity size was moderately dilated. Left ventricular diastolic parameters are consistent with  Grade I diastolic dysfunction (impaired relaxation). Elevated left ventricular end-diastolic pressure.  2. Device leads in RA/RV. Right ventricular systolic function is normal. The right ventricular size is normal. There is moderately elevated pulmonary artery systolic pressure.  3. Left atrial size was moderately dilated.  4. The mitral valve is abnormal. Mild mitral valve regurgitation. No evidence of mitral stenosis.  5. The aortic valve is tricuspid. There is moderate calcification of the aortic valve. Aortic valve regurgitation is not  visualized. Mild aortic valve stenosis.  6. The inferior vena cava is dilated in size with <50% respiratory variability, suggesting right atrial pressure of 15 mmHg. FINDINGS  Left Ventricle: Septal, apical and inferior wall hypokinesis. Left ventricular ejection fraction, by estimation, is 25 to 30%. The left ventricle has severely decreased function. The left ventricle demonstrates regional wall motion abnormalities. Definity contrast agent was given IV to delineate the left ventricular endocardial borders. The left ventricular internal cavity size was moderately dilated. There is no left ventricular hypertrophy. Left ventricular diastolic parameters are consistent with Grade I diastolic dysfunction (impaired relaxation). Elevated left ventricular end-diastolic pressure. Right Ventricle: Device leads in RA/RV. The right ventricular size is normal. No increase in right ventricular wall thickness. Right ventricular systolic function is normal. There is moderately elevated pulmonary artery systolic pressure. The tricuspid regurgitant velocity is 3.18 m/s, and with an assumed right atrial pressure of 8 mmHg, the estimated right ventricular systolic pressure is 48.4 mmHg. Left Atrium: Left atrial size was moderately dilated. Right Atrium: Right atrial size was normal in size. Pericardium: There is no evidence of pericardial effusion. Mitral Valve: The mitral valve is abnormal. There is mild thickening of the mitral valve leaflet(s). There is mild calcification of the mitral valve leaflet(s). Mild mitral annular calcification. Mild mitral valve regurgitation. No evidence of mitral valve stenosis. Tricuspid Valve: The tricuspid valve is normal in structure. Tricuspid valve regurgitation is not demonstrated. No evidence of tricuspid stenosis. Aortic Valve: The aortic valve is tricuspid. There is moderate calcification of the aortic valve. Aortic valve regurgitation is not visualized. Mild aortic stenosis is present.  Aortic valve mean gradient measures 7.0 mmHg. Aortic valve peak gradient measures 12.1 mmHg. Aortic valve area, by VTI measures 1.23 cm. Pulmonic Valve: The pulmonic valve was normal in structure. Pulmonic valve regurgitation is mild. No evidence of pulmonic stenosis. Aorta: The aortic root is normal in size and structure. Venous: The inferior vena cava is dilated in size with less than 50% respiratory variability, suggesting right atrial pressure of 15 mmHg. IAS/Shunts: No atrial level shunt detected by color flow Doppler. Additional Comments: A device lead is visualized.  LEFT VENTRICLE PLAX 2D LVIDd:         5.30 cm      Diastology LVIDs:         4.40 cm      LV e' medial:    4.35 cm/s LV PW:         1.10 cm      LV E/e' medial:  24.6 LV IVS:        1.00 cm      LV e' lateral:   7.51 cm/s LVOT diam:     1.90 cm      LV E/e' lateral: 14.2 LV SV:         44 LV SV Index:   24 LVOT Area:     2.84 cm  LV Volumes (MOD) LV vol d, MOD A2C: 101.0 ml LV vol d, MOD A4C: 134.0 ml  LV vol s, MOD A2C: 69.2 ml LV vol s, MOD A4C: 87.1 ml LV SV MOD A2C:     31.8 ml LV SV MOD A4C:     134.0 ml LV SV MOD BP:      41.9 ml RIGHT VENTRICLE            IVC RV Basal diam:  3.60 cm    IVC diam: 2.20 cm RV S prime:     6.85 cm/s LEFT ATRIUM             Index        RIGHT ATRIUM           Index LA diam:        4.60 cm 2.46 cm/m   RA Area:     20.00 cm LA Vol (A2C):   96.4 ml 51.51 ml/m  RA Volume:   56.20 ml  30.03 ml/m LA Vol (A4C):   94.5 ml 50.49 ml/m LA Biplane Vol: 95.8 ml 51.19 ml/m  AORTIC VALVE AV Area (Vmax):    1.18 cm AV Area (Vmean):   1.11 cm AV Area (VTI):     1.23 cm AV Vmax:           174.00 cm/s AV Vmean:          131.000 cm/s AV VTI:            0.360 m AV Peak Grad:      12.1 mmHg AV Mean Grad:      7.0 mmHg LVOT Vmax:         72.50 cm/s LVOT Vmean:        51.100 cm/s LVOT VTI:          0.156 m LVOT/AV VTI ratio: 0.43  AORTA Ao Root diam: 2.50 cm MITRAL VALVE                TRICUSPID VALVE MV Area (PHT): 4.12 cm      TR Peak grad:   40.4 mmHg MV Decel Time: 184 msec     TR Vmax:        318.00 cm/s MV E velocity: 107.00 cm/s MV A velocity: 29.10 cm/s   SHUNTS MV E/A ratio:  3.68         Systemic VTI:  0.16 m                             Systemic Diam: 1.90 cm Charlton Haws MD Electronically signed by Charlton Haws MD Signature Date/Time: 04/05/2023/9:34:22 AM    Final (Updated)    CARDIAC CATHETERIZATION Result Date: 04/04/2023 Images from the original result were not included. Coronary intervention 04/04/2023: (Please see detailed diagnostic angiogram report 04/03/2023) Successful percutaneous coronary intervention prox-mid RCA & mid LCx        PTCA and stent placement 3.5 X 20 mm Synergy drug-eluting stent mid RCA          Post dilatation using 3.75 X 15 mm Alden balloon up to 18 atm        PTCA and stent placement 2.5 X 32 mm Synergy drug-eluting stent mid LCx          Post dilatation using 2.75 X 15 mm Daniels balloon up to 18 atm No residual stenosis within the stented segments.  Minimal residual 30% stenosis distal to the left circumflex stent. Consider triple therapy with aspirin, Plavix, Eliquis for 1 month. Then, stop aspirin and continue Plavix  and Eliquis for up to 1 year. Contrast used: 85 cc Will hydrate the patient with 100 cc/hr for next 8 hours. Elder Negus, MD   CARDIAC CATHETERIZATION Addendum Date: 04/03/2023 Coronary angiography 04/03/2023: LM: Normal LAD: Patent prox stent, no significant restenosis          40% mid LAD stenosis Lcx: Mid 80%, and 60% tandem stenoses on either side of OM with 80% ostial stenosis RCA: Proximal/mid eccentric 90% stenosis LVEDP 6 mmHg Intervention not attempted today due to access related issues. Patient hemodynamically stable.  No large hematoma at access site. Will attempt intervention through left radial access tomorrow. Elder Negus, MD   Result Date: 04/03/2023 Images from the original result were not included.   2nd Mrg lesion is 80% stenosed. Coronary  angiography 04/03/2023: LM: Normal LAD: Patent prox stent, no significant restenosis          40% mid LAD stenosis Lcx: Mid 80%, and 60% tandem stenoses on either side of OM with 80% ostial stenosis RCA: Proximal/mid eccentric 90% stenosis LVEDP 6 mmHg Intervention not attempted today due to access related issues. Patient hemodynamically stable.  No large hematoma at access site. Will attempt intervention through left radial access tomorrow. Elder Negus, MD   DG Chest Port 1 View Result Date: 04/01/2023 CLINICAL DATA:  Dizziness and weakness. EXAM: PORTABLE CHEST 1 VIEW COMPARISON:  February 02, 2023 FINDINGS: There is stable single lead ventricular pacer positioning. The cardiac silhouette is mildly enlarged and unchanged in size. There is moderate severity calcification of the aortic arch. Both lungs are clear. Multilevel degenerative changes seen throughout the thoracic spine. IMPRESSION: No active cardiopulmonary disease. Electronically Signed   By: Aram Candela M.D.   On: 04/01/2023 20:36   Disposition   Pt is being discharged home today in good condition.  Follow-up Plans & Appointments     Follow-up Information     Anabel Halon, MD Follow up on 05/16/2023.   Specialty: Internal Medicine Why: 10:20 am for new patient and follow up, please bring insurance, ID and list of medications Contact information: [address] [place] [address] [phone]                Discharge Instructions     Amb Referral to Cardiac Rehabilitation   Complete by: As directed    Diagnosis:  NSTEMI Coronary Stents     After initial evaluation and assessments completed: Virtual Based Care may be provided alone or in conjunction with Phase 2 Cardiac Rehab based on patient barriers.: Yes   Intensive Cardiac Rehabilitation (ICR) [facility] location only OR Traditional Cardiac Rehabilitation (TCR) *If criteria for ICR are not met will enroll in TCR [facility] only): Yes   Amb referral to AFIB  Clinic   Complete by: As directed         Discharge Medications   Allergies as of 04/05/2023       Reactions   Iodinated Contrast Media Shortness Of Breath   Per Patient- The last time she had contrast during a scan she had difficulty breathing   Codeine Hives   Shrimp [shellfish Allergy] Hives, Other (See Comments)   Broke out   Amiodarone Nausea And Vomiting   Penicillins Itching, Rash   swelling of the face/tongue/throat, SOB, or low BP sudden or severe rash/hives, skin peeling, or any reaction on the inside of your mouth or nose   Sulfa Antibiotics Itching        Medication List  STOP taking these medications    simvastatin 20 MG tablet Commonly known as: ZOCOR       TAKE these medications    acetaminophen 325 MG tablet Commonly known as: TYLENOL Take 2 tablets (650 mg total) by mouth 3 (three) times daily.   apixaban 2.5 MG Tabs tablet Commonly known as: ELIQUIS Take 1 tablet (2.5 mg total) by mouth 2 (two) times daily.   aspirin EC 81 MG tablet Take 1 tablet (81 mg total) by mouth daily. Swallow whole. Start taking on: April 06, 2023   atorvastatin 80 MG tablet Commonly known as: Lipitor Take 1 tablet (80 mg total) by mouth daily.   clopidogrel 75 MG tablet Commonly known as: PLAVIX Take 1 tablet (75 mg total) by mouth daily with breakfast. Start taking on: April 06, 2023   Digoxin 62.5 MCG Tabs Take 0.0625 mg by mouth 3 (three) times a week. Taking Monday, Wednesday, and Friday.   furosemide 20 MG tablet Commonly known as: Lasix Take 1 tablet (20 mg total) by mouth daily.   lidocaine 5 % Commonly known as: LIDODERM Place 1 patch onto the skin daily as needed (back pain). Remove & Discard patch within 12 hours or as directed by MD   metoprolol succinate 50 MG 24 hr tablet Commonly known as: TOPROL-XL TAKE 1 TABLET(50 MG) BY MOUTH AT BEDTIME   nitroGLYCERIN 0.4 MG SL tablet Commonly known as: NITROSTAT Place 1 tablet (0.4 mg  total) under the tongue every 5 (five) minutes x 3 doses as needed for chest pain.   nystatin powder Commonly known as: MYCOSTATIN/NYSTOP Apply topically 3 (three) times daily.   pantoprazole 40 MG tablet Commonly known as: Protonix Take 1 tablet (40 mg total) by mouth daily.   spironolactone 25 MG tablet Commonly known as: ALDACTONE Take 1 tablet (25 mg total) by mouth at bedtime.           Outstanding Labs/Studies   BMP in 1 week  Duration of Discharge Encounter: APP Time: 30 minutes   Signed, Marcelino Duster, PA 04/05/2023, 1:37 PM  ATTENDING ATTESTATION:  After conducting a review of all available clinical information with the care team, interviewing the patient, and performing a physical exam, I agree with the findings and plan described in this note.   GEN: No acute distress.   Neck: No JVD Cardiac:  RRR, soft systolic murmur Respiratory: Mild end expiratory wheezes at bilateraly bases GI: Soft, nontender, non-distended  MS: No edema Skin: L radial site CDI, left femoral site with bruising but no hematoma  NSTEMI CAD -- s/p STEMI in 06/2019 with DES to Proximal LAD -- s/p PCI RCA and Lcx yesterday --Triple therapy x 1 month then plavix and Eliquis x 1 year (ACS) then Eliquis indefinitely   Chronic HFrEF -- EF 20-25% by echo in 11/2022 -- Continue Toprol-XL 50 mg daily and Digoxin 0.0625 mg MWF.  -- Start lasix 20mg  qday and Spironolactone 25mg ; check BMP next week -- LVEDP on cath yesterday --Previously on Entresto but this was discontinued given variable renal function. Would defer SGLT2i given body habitus   Paroxysmal Atrial Fibrillation/Flutter -- s/p ablation in 03/2021 with recurrence and not to be a candidate for repeat ablation due to obesity and age. Previously intolerant to Amiodarone.  -- in atrial flutter with RVR upon admission, converted to sinus rhythm. Back in atrial flutter rates 90-100 -- In NSR today, continue Toprol-XL and  Digoxin for now.  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 Followed by Dr. Ladona Ridgel.    Aortic Stenosis --This was mild to moderate by echo in 11/2022. Continue to follow as an outpatient.    Stage 3 CKD -- Cr stable today -- Check BMP next week   Asthma --Mild wheezing today.  Her symptoms are infrequent but present today.  Will d/w pharmacy.   Disposition: --D/C today with BMP next week and close hospital follow up.  Alverda Skeans, MD Pager [phone]

## 2023-04-05 NOTE — Progress Notes (Signed)
Mobility Specialist Progress Note;   04/05/23 1000  Mobility  Activity Ambulated with assistance in hallway  Level of Assistance Contact guard assist, steadying assist  Assistive Device Front wheel walker  Distance Ambulated (ft) 150 ft  Activity Response Tolerated well  Mobility Referral Yes  Mobility visit 1 Mobility  Mobility Specialist Start Time (ACUTE ONLY) 1000  Mobility Specialist Stop Time (ACUTE ONLY) 1017  Mobility Specialist Time Calculation (min) (ACUTE ONLY) 17 min   Pt agreeable to mobility. Required MinG assistance throughout ambulation for safety. Displayed a cough throughout session. VSS throughout. Seemed a bit fatigued once returned to room. Requested assistance to BR, void successful. Pt returned back to bed with all needs met.   Caesar Bookman Mobility Specialist Please contact via SecureChat or [employer] [phone]

## 2023-04-05 NOTE — Progress Notes (Signed)
BMP

## 2023-04-06 ENCOUNTER — Other Ambulatory Visit (HOSPITAL_COMMUNITY): Payer: Self-pay

## 2023-04-08 NOTE — Addendum Note (Signed)
Addended by: Geralyn Flash D on: 04/08/2023 11:34 AM   Modules accepted: Orders

## 2023-04-08 NOTE — Progress Notes (Signed)
Remote ICD transmission.   

## 2023-04-16 ENCOUNTER — Ambulatory Visit: Payer: No Typology Code available for payment source | Attending: Internal Medicine | Admitting: Internal Medicine

## 2023-04-16 NOTE — Progress Notes (Signed)
Erroneous encounter - please disregard.

## 2023-04-25 ENCOUNTER — Telehealth: Payer: Self-pay

## 2023-04-25 NOTE — Telephone Encounter (Signed)
 Patient had echo completed in 03/2023. Was due for 1y echo in 05/2023  Please advise if patient still needs to complete echo for cardiology.

## 2023-04-25 NOTE — Telephone Encounter (Signed)
-----   Message from [facility] sent at 05/30/2022  9:00 AM EDT ----- Regarding: Echo 1y echo- AS-VM

## 2023-05-01 ENCOUNTER — Ambulatory Visit: Payer: No Typology Code available for payment source | Admitting: Nurse Practitioner

## 2023-05-13 ENCOUNTER — Ambulatory Visit: Payer: No Typology Code available for payment source

## 2023-05-16 ENCOUNTER — Encounter: Payer: Self-pay | Admitting: Internal Medicine

## 2023-05-16 ENCOUNTER — Ambulatory Visit (INDEPENDENT_AMBULATORY_CARE_PROVIDER_SITE_OTHER): Payer: No Typology Code available for payment source | Admitting: Internal Medicine

## 2023-05-16 VITALS — BP 138/88 | HR 98 | Ht 62.0 in | Wt 188.2 lb

## 2023-05-16 DIAGNOSIS — Z95810 Presence of automatic (implantable) cardiac defibrillator: Secondary | ICD-10-CM

## 2023-05-16 DIAGNOSIS — I200 Unstable angina: Secondary | ICD-10-CM

## 2023-05-16 DIAGNOSIS — I4729 Other ventricular tachycardia: Secondary | ICD-10-CM | POA: Diagnosis not present

## 2023-05-16 DIAGNOSIS — N184 Chronic kidney disease, stage 4 (severe): Secondary | ICD-10-CM

## 2023-05-16 DIAGNOSIS — I5042 Chronic combined systolic (congestive) and diastolic (congestive) heart failure: Secondary | ICD-10-CM

## 2023-05-16 DIAGNOSIS — D631 Anemia in chronic kidney disease: Secondary | ICD-10-CM | POA: Diagnosis not present

## 2023-05-16 DIAGNOSIS — R809 Proteinuria, unspecified: Secondary | ICD-10-CM | POA: Diagnosis not present

## 2023-05-16 DIAGNOSIS — J0110 Acute frontal sinusitis, unspecified: Secondary | ICD-10-CM

## 2023-05-16 DIAGNOSIS — I255 Ischemic cardiomyopathy: Secondary | ICD-10-CM

## 2023-05-16 DIAGNOSIS — I480 Paroxysmal atrial fibrillation: Secondary | ICD-10-CM

## 2023-05-16 DIAGNOSIS — N189 Chronic kidney disease, unspecified: Secondary | ICD-10-CM | POA: Diagnosis not present

## 2023-05-16 DIAGNOSIS — E211 Secondary hyperparathyroidism, not elsewhere classified: Secondary | ICD-10-CM | POA: Diagnosis not present

## 2023-05-16 DIAGNOSIS — E1169 Type 2 diabetes mellitus with other specified complication: Secondary | ICD-10-CM

## 2023-05-16 MED ORDER — AMOXICILLIN-POT CLAVULANATE 875-125 MG PO TABS: 1.0000 | ORAL_TABLET | Freq: Two times a day (BID) | ORAL | 0 refills | Status: DC

## 2023-05-16 MED ORDER — FLUTICASONE PROPIONATE 50 MCG/ACT NA SUSP: 2.0000 | Freq: Every day | NASAL | 1 refills | Status: AC

## 2023-05-16 NOTE — Assessment & Plan Note (Signed)
 On metoprolol, has nitroglycerin as needed for chest pain Recently had stent placement due to NSTEMI Followed by cardiology Needs to be compliant to Plavix and statin

## 2023-05-16 NOTE — Assessment & Plan Note (Signed)
 History of CAD On beta-blocker and spironolactone Has Lasix 20 mg once daily Appears euvolemic currently Followed by cardiology

## 2023-05-16 NOTE — Assessment & Plan Note (Signed)
 History of VT Has his AICD in place Followed by EP cardiology

## 2023-05-16 NOTE — Assessment & Plan Note (Addendum)
 Lab Results  Component Value Date   HGBA1C 6.2 (H) 12/08/2022   Check HbA1c Associated with CAD, HTN, HLD and CKD Was well controlled with metformin, had to be discontinued due to progressive CKD Advised to follow diabetic diet On statin F/u CMP and HbA1c Diabetic eye exam: Advised to follow up with Ophthalmology for diabetic eye exam

## 2023-05-16 NOTE — Assessment & Plan Note (Signed)
 Started empiric Augmentin due to persistent nasal congestion despite symptomatic treatment -she has tolerated Augmentin before Flonase for nasal congestion Sinus rinse and/or vaporizer as needed for nasal congestion

## 2023-05-16 NOTE — Assessment & Plan Note (Signed)
 Avoid nephrotoxic agents Maintain adequate hydration On Lasix and spironolactone for HFrEF Followed by nephrology-Dr. Wolfgang Phoenix

## 2023-05-16 NOTE — Assessment & Plan Note (Signed)
 Due to history of VT and HFrEF Followed by EP cardiology

## 2023-05-16 NOTE — Assessment & Plan Note (Signed)
 Rate controlled with metoprolol 50 mg QD and digoxin 0.0625 mg 3 times in a week On Eliquis 2.5 mg twice daily, due to CKD and her age

## 2023-05-16 NOTE — Patient Instructions (Addendum)
 Schedule your Medicare Annual Wellness Visit at checkout.  Please start taking Augmentin as prescribed. Please use Flonase for nasal congestion. Use vaporizer for nasal congestion.  Please continue to take other medications as prescribed.  Please continue to follow low carb diet and ambulate as tolerated.

## 2023-05-16 NOTE — Progress Notes (Signed)
 New Patient Office Visit  Subjective:  Patient ID: Terry Hanson, female    DOB: [birth year]  Age: 83 y.o. MRN: [id number]  CC:  Chief Complaint  Patient presents with   Establish Care    New patient establishing care. Needs a new pcp.     HPI Terry Hanson is a 83 y.o. female with past medical history of HTN, CAD status post stent placement, paroxysmal atrial fibrillation, HFrEF, type 2 DM, CKD and asthma who presents for establishing care.  CAD, HFrEF and A-fib: She was admitted at [facility] in 01/25 for NSTEMI, had stent placed - PTCA/DES-RCA with 3.5 x 20 mm stent. Also treated with PTCA/DES-mid LCX with 2. 5 x 32 mm stent.  She is currently on Plavix, but has stopped taking Lipitor 80 mg QD.  She takes metoprolol, digoxin and Eliquis for history of A-fib.  She has AICD in place due to history of ventricular tachycardia and HFrEF.  She takes spironolactone and Lasix for HFrEF.  Denies any overt dyspnea or chest pain currently.  Followed by cardiology and EP cardiology.  GERD: She takes pantoprazole for GERD.  Type II DM: Her last HbA1c was 6.2 in 09/24. She used to take metformin, but was recently discontinued due to progressive CKD.  Her GFR ranges around 25 now.  Followed by Dr. Wolfgang Phoenix.   She reports nasal congestion, postnasal drip, sore throat and dry cough for the last 4 weeks.  Denies any fever or chills.  She has tried OTC nasal decongestant without much relief.  She uses humidifier regularly.  Past Medical History:  Diagnosis Date   Arthritis    Asthma    Chronic HFrEF (heart failure with reduced ejection fraction) (HCC)    Chronic kidney disease, stage 3b (HCC)    Diabetes mellitus (HCC)    Hypercholesterolemia    Hyperlipidemia    Ischemic cardiomyopathy    STEMI (ST elevation myocardial infarction) (HCC)    a. 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    Past Surgical History:  Procedure Laterality Date   ABDOMINAL HYSTERECTOMY     ATRIAL  FIBRILLATION ABLATION N/A 04/05/2021   Procedure: ATRIAL FIBRILLATION ABLATION;  Surgeon: Regan Lemming, MD;  Location: [facility] INVASIVE CV LAB;  Service: Cardiovascular;  Laterality: N/A;   CATARACT EXTRACTION W/PHACO Right 06/27/2020   Procedure: CATARACT EXTRACTION PHACO AND INTRAOCULAR LENS PLACEMENT RIGHT EYE AND PLACEMENT OF CORTICOSTEROID;  Surgeon: Fabio Pierce, MD;  Location: [facility];  Service: Ophthalmology;  Laterality: Right;  right CDE=25.47   CORONARY STENT INTERVENTION N/A 04/04/2023   Procedure: CORONARY STENT INTERVENTION;  Surgeon: Elder Negus, MD;  Location: [facility] INVASIVE CV LAB;  Service: Cardiovascular;  Laterality: N/A;   CORONARY/GRAFT ACUTE MI REVASCULARIZATION N/A 06/29/2019   Procedure: Coronary/Graft Acute MI Revascularization;  Surgeon: Lyn Records, MD;  Location: [facility] INVASIVE CV LAB;  Service: Cardiovascular;  Laterality: N/A;   ICD IMPLANT N/A 12/14/2019   Procedure: ICD IMPLANT;  Surgeon: Marinus Maw, MD;  Location: [facility] INVASIVE CV LAB;  Service: Cardiovascular;  Laterality: N/A;   LEFT HEART CATH AND CORONARY ANGIOGRAPHY N/A 06/29/2019   Procedure: LEFT HEART CATH AND CORONARY ANGIOGRAPHY;  Surgeon: Lyn Records, MD;  Location: [facility] INVASIVE CV LAB;  Service: Cardiovascular;  Laterality: N/A;   LEFT HEART CATH AND CORONARY ANGIOGRAPHY N/A 04/03/2023   Procedure: LEFT HEART CATH AND CORONARY ANGIOGRAPHY;  Surgeon: Elder Negus, MD;  Location: [facility] INV[facility]IVE CV LAB;  Service: Cardiovascular;  Laterality: N/A;   RIGHT HEART CATH N/A 12/30/2020   Procedure: RIGHT HEART CATH;  Surgeon: Laurey Morale, MD;  Location[facility] INVASIVE CV LAB;  Service: Cardiovascular;  Laterality: N/A;   TEE WITHOUT CARDIOVERSION N/A 04/04/2021   Procedure: TRANSESOPHAGEAL ECHOCARDIOGRAM (TEE);  Surgeon: Jake Bathe, MD;  Location: [facility] ENDOSCOPY;  Service: Cardiovascular;  Laterality: N/A;    Family History  Problem Relation Age of Onset   Heart disease Mother     Social  History   Socioeconomic History   Marital status: Married    Spouse name: Not on file   Number of children: Not on file   Years of education: Not on file   Highest education level: Not on file  Occupational History   Not on file  Tobacco Use   Smoking status: Never   Smokeless tobacco: Former    Types: Chew   Tobacco comments:    Former chew tobacco 05/03/21  Vaping Use   Vaping status: Never Used  Substance and Sexual Activity   Alcohol use: Not Currently   Drug use: Never   Sexual activity: Not on file  Other Topics Concern   Not on file  Social History Narrative   ** Merged History Encounter **       Social Drivers of Health   Financial Resource Strain: Not on file  Food Insecurity: No Food Insecurity (04/02/2023)   Hunger Vital Sign    Worried About Running Out of Food in the Last Year: Never true    Ran Out of Food in the Last Year: Never true  Transportation Needs: No Transportation Needs (04/02/2023)   PRAPARE - Administrator, Civil Service (Medical): No    Lack of Transportation (Non-Medical): No  Physical Activity: Not on file  Stress: Not on file  Social Connections: Moderately Isolated (04/02/2023)   Social Connection and Isolation Panel [NHANES]    Frequency of Communication with Friends and Family: More than three times a week    Frequency of Social Gatherings with Friends and Family: More than three times a week    Attends Religious Services: 1 to 4 times per year    Active Member of [employer] or Organizations: No    Attends Banker Meetings: Never    Marital Status: Widowed  Intimate Partner Violence: Not At Risk (04/02/2023)   Humiliation, Afraid, Rape, and Kick questionnaire    Fear of Current or Ex-Partner: No    Emotionally Abused: No    Physically Abused: No    Sexually Abused: No    ROS Review of Systems  Constitutional:  Positive for fatigue. Negative for chills and fever.  HENT:  Negative for congestion, sinus  pressure, sinus pain and sore throat.   Eyes:  Negative for pain and discharge.  Respiratory:  Negative for cough and shortness of breath.   Cardiovascular:  Negative for chest pain and palpitations.  Gastrointestinal:  Negative for abdominal pain, diarrhea, nausea and vomiting.  Endocrine: Negative for polydipsia and polyuria.  Genitourinary:  Negative for dysuria and hematuria.  Musculoskeletal:  Negative for neck pain and neck stiffness.  Skin:  Negative for rash.  Neurological:  Negative for dizziness and weakness.  Psychiatric/Behavioral:  Negative for agitation and behavioral problems.     Objective:   Today's Vitals: BP 138/88 (BP Location: Left Arm)   Pulse 98   Ht 5\' 2"  (1.575 m)   Wt 188 lb 3.2 oz (85.4 kg)   SpO2 94%  BMI 34.42 kg/m   Physical Exam Vitals reviewed.  Constitutional:      General: She is not in acute distress.    Appearance: She is obese. She is not diaphoretic.  HENT:     Head: Normocephalic and atraumatic.     Nose: Congestion present.     Right Sinus: Maxillary sinus tenderness and frontal sinus tenderness present.     Left Sinus: Maxillary sinus tenderness and frontal sinus tenderness present.     Mouth/Throat:     Mouth: Mucous membranes are moist.  Eyes:     General: No scleral icterus.    Extraocular Movements: Extraocular movements intact.  Cardiovascular:     Rate and Rhythm: Normal rate and regular rhythm.     Heart sounds: Normal heart sounds. No murmur heard. Pulmonary:     Breath sounds: Normal breath sounds. No wheezing or rales.  Abdominal:     Palpations: Abdomen is soft.     Tenderness: There is no abdominal tenderness.  Musculoskeletal:     Cervical back: Neck supple. No tenderness.     Right lower leg: No edema.     Left lower leg: No edema.  Skin:    General: Skin is warm.     Findings: No rash.  Neurological:     General: No focal deficit present.     Mental Status: She is alert and oriented to person, place, and  time.  Psychiatric:        Mood and Affect: Mood normal.        Behavior: Behavior normal.     Assessment & Plan:   Problem List Items Addressed This Visit       Cardiovascular and Mediastinum   Ischemic cardiomyopathy (Chronic)   History of CAD On beta-blocker and spironolactone Has Lasix 20 mg once daily Appears euvolemic currently Followed by cardiology      Relevant Medications   atorvastatin (LIPITOR) 80 MG tablet   Paroxysmal A-fib (HCC) (Chronic)   Rate controlled with metoprolol 50 mg QD and digoxin 0.0625 mg 3 times in a week On Eliquis 2.5 mg twice daily, due to CKD and her age      Relevant Medications   atorvastatin (LIPITOR) 80 MG tablet   Chronic combined systolic and diastolic heart failure (HCC)   Known LVEF of 20-25% on echo 11/2022 --> now read as 25-30%. ICD in place.  Previously on entresto but this was discontinued given fluctuating renal function SGLT2i deferred given body habitus and GFR close to 25 Continue toprol and digoxin 0.0625 mg three times weekly Continue lasix 20 mg QD and spironolactone 25 mg once daily Appears euvolemic currently      Relevant Medications   atorvastatin (LIPITOR) 80 MG tablet   Polymorphic ventricular tachycardia (HCC)   History of VT Has his AICD in place Followed by EP cardiology      Relevant Medications   atorvastatin (LIPITOR) 80 MG tablet   Unstable angina (HCC)   On metoprolol, has nitroglycerin as needed for chest pain Recently had stent placement due to NSTEMI Followed by cardiology Needs to be compliant to Plavix and statin      Relevant Medications   atorvastatin (LIPITOR) 80 MG tablet     Respiratory   Acute non-recurrent frontal sinusitis   Started empiric Augmentin due to persistent nasal congestion despite symptomatic treatment -she has tolerated Augmentin before Flonase for nasal congestion Sinus rinse and/or vaporizer as needed for nasal congestion      Relevant Medications  amoxicillin-clavulanate (AUGMENTIN) 875-125 MG tablet   fluticasone (FLONASE) 50 MCG/ACT nasal spray     Endocrine   Type 2 diabetes mellitus with other specified complication (HCC) - Primary   Lab Results  Component Value Date   HGBA1C 6.2 (H) 12/08/2022   Check HbA1c Associated with CAD, HTN, HLD and CKD Was well controlled with metformin, had to be discontinued due to progressive CKD Advised to follow diabetic diet On statin F/u CMP and HbA1c Diabetic eye exam: Advised to follow up with Ophthalmology for diabetic eye exam      Relevant Medications   atorvastatin (LIPITOR) 80 MG tablet   Other Relevant Orders   Microalbumin / creatinine urine ratio   Ambulatory referral to Optometry   Hemoglobin A1c     Genitourinary   Chronic kidney disease, stage 4 (severe) (HCC)   Avoid nephrotoxic agents Maintain adequate hydration On Lasix and spironolactone for HFrEF Followed by nephrology-Dr. 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   amoxicillin-clavulanate (AUGMENTIN) 875-125 MG tablet Take 1 tablet by mouth 2 (two) times daily.   apixaban (ELIQUIS) 2.5 MG TABS tablet Take 1 tablet (2.5 mg total) by mouth 2 (two) times daily.   atorvastatin (LIPITOR) 80 MG tablet Take 80 mg by mouth daily.   clopidogrel (PLAVIX) 75 MG tablet Take 1 tablet (75 mg total) by mouth daily with breakfast.   Digoxin 62.5 MCG TABS Take 0.0625 mg by mouth 3 (three) times a week. Taking Monday, Wednesday, and Friday.   fluticasone (FLONASE) 50 MCG/ACT nasal spray Place 2 sprays into both nostrils daily.   furosemide (LASIX) 20 MG tablet Take 1 tablet (20 mg total) by mouth daily.   lidocaine (LIDODERM) 5 % Place 1 patch onto  the skin daily as needed (back pain). Remove & Discard patch within 12 hours or as directed by MD   metoprolol succinate (TOPROL-XL) 50 MG 24 hr tablet TAKE 1 TABLET(50 MG) BY MOUTH AT BEDTIME   nitroGLYCERIN (NITROSTAT) 0.4 MG SL tablet Place 1 tablet (0.4 mg total) under the tongue every 5 (five) minutes x 3 doses as needed for chest pain.   pantoprazole (PROTONIX) 40 MG tablet Take 1 tablet (40 mg total) by mouth daily.   spironolactone (ALDACTONE) 25 MG tablet Take 1 tablet (25 mg total) by mouth at bedtime.   nystatin (MYCOSTATIN/NYSTOP) powder Apply topically 3 (three) times daily. (Patient not taking: Reported on 05/16/2023)   [DISCONTINUED] atorvastatin (LIPITOR) 80 MG tablet Take 1 tablet (80 mg total) by mouth daily.   No facility-administered encounter medications on file as of 05/16/2023.    Follow-up: Return in about 6 months (around 11/13/2023) for CAD and HTN.   Anabel Halon, MD

## 2023-05-16 NOTE — Assessment & Plan Note (Addendum)
 Known LVEF of 20-25% on echo 11/2022 --> now read as 25-30%. ICD in place.  Previously on entresto but this was discontinued given fluctuating renal function SGLT2i deferred given body habitus and GFR close to 25 Continue toprol and digoxin 0.0625 mg three times weekly Continue lasix 20 mg QD and spironolactone 25 mg once daily Appears euvolemic currently

## 2023-05-17 LAB — HEMOGLOBIN A1C
Est. average glucose Bld gHb Est-mCnc: 117 mg/dL
Hgb A1c MFr Bld: 5.7 % — ABNORMAL HIGH (ref 4.8–5.6)

## 2023-05-21 DIAGNOSIS — D631 Anemia in chronic kidney disease: Secondary | ICD-10-CM | POA: Diagnosis not present

## 2023-05-21 DIAGNOSIS — E1129 Type 2 diabetes mellitus with other diabetic kidney complication: Secondary | ICD-10-CM | POA: Diagnosis not present

## 2023-05-21 DIAGNOSIS — I300 Acute nonspecific idiopathic pericarditis: Secondary | ICD-10-CM | POA: Diagnosis not present

## 2023-05-21 DIAGNOSIS — E875 Hyperkalemia: Secondary | ICD-10-CM | POA: Diagnosis not present

## 2023-06-03 ENCOUNTER — Ambulatory Visit: Payer: No Typology Code available for payment source

## 2023-06-03 DIAGNOSIS — I5022 Chronic systolic (congestive) heart failure: Secondary | ICD-10-CM

## 2023-06-03 DIAGNOSIS — I255 Ischemic cardiomyopathy: Secondary | ICD-10-CM | POA: Diagnosis not present

## 2023-06-07 LAB — CUP PACEART REMOTE DEVICE CHECK
Battery Remaining Longevity: 83 mo
Battery Remaining Percentage: 68 %
Battery Voltage: 2.99 V
Brady Statistic RV Percent Paced: 1 %
Date Time Interrogation Session: 20250321072954
HighPow Impedance: 78 Ohm
Implantable Lead Connection Status: 753985
Implantable Lead Implant Date: 20210927
Implantable Lead Location: 753860
Implantable Pulse Generator Implant Date: 20210927
Lead Channel Impedance Value: 360 Ohm
Lead Channel Pacing Threshold Amplitude: 0.75 V
Lead Channel Pacing Threshold Pulse Width: 0.5 ms
Lead Channel Sensing Intrinsic Amplitude: 11.6 mV
Lead Channel Setting Pacing Amplitude: 2.5 V
Lead Channel Setting Pacing Pulse Width: 0.5 ms
Lead Channel Setting Sensing Sensitivity: 0.5 mV
Pulse Gen Serial Number: 810006603
Zone Setting Status: 755011

## 2023-06-10 ENCOUNTER — Encounter: Payer: Self-pay | Admitting: Internal Medicine

## 2023-06-21 ENCOUNTER — Ambulatory Visit: Payer: No Typology Code available for payment source | Attending: Nurse Practitioner | Admitting: Nurse Practitioner

## 2023-06-21 ENCOUNTER — Encounter: Payer: Self-pay | Admitting: Nurse Practitioner

## 2023-06-21 VITALS — BP 136/60 | HR 63 | Ht 62.0 in | Wt 181.6 lb

## 2023-06-21 DIAGNOSIS — Z95810 Presence of automatic (implantable) cardiac defibrillator: Secondary | ICD-10-CM

## 2023-06-21 DIAGNOSIS — I471 Supraventricular tachycardia, unspecified: Secondary | ICD-10-CM | POA: Diagnosis not present

## 2023-06-21 DIAGNOSIS — R600 Localized edema: Secondary | ICD-10-CM

## 2023-06-21 DIAGNOSIS — I2510 Atherosclerotic heart disease of native coronary artery without angina pectoris: Secondary | ICD-10-CM

## 2023-06-21 DIAGNOSIS — I350 Nonrheumatic aortic (valve) stenosis: Secondary | ICD-10-CM

## 2023-06-21 DIAGNOSIS — D649 Anemia, unspecified: Secondary | ICD-10-CM

## 2023-06-21 DIAGNOSIS — I4892 Unspecified atrial flutter: Secondary | ICD-10-CM

## 2023-06-21 DIAGNOSIS — N1832 Chronic kidney disease, stage 3b: Secondary | ICD-10-CM

## 2023-06-21 DIAGNOSIS — R5383 Other fatigue: Secondary | ICD-10-CM

## 2023-06-21 DIAGNOSIS — I4819 Other persistent atrial fibrillation: Secondary | ICD-10-CM | POA: Diagnosis not present

## 2023-06-21 DIAGNOSIS — I472 Ventricular tachycardia, unspecified: Secondary | ICD-10-CM

## 2023-06-21 DIAGNOSIS — I480 Paroxysmal atrial fibrillation: Secondary | ICD-10-CM

## 2023-06-21 DIAGNOSIS — E7800 Pure hypercholesterolemia, unspecified: Secondary | ICD-10-CM

## 2023-06-21 DIAGNOSIS — I5022 Chronic systolic (congestive) heart failure: Secondary | ICD-10-CM

## 2023-06-21 MED ORDER — SPIRONOLACTONE 25 MG PO TABS: 25.0000 mg | ORAL_TABLET | Freq: Every day | ORAL | Status: AC

## 2023-06-21 MED ORDER — COMPRESSION BANDAGES KIT: 1.0000 | PACK | Freq: Every day | 0 refills | Status: AC

## 2023-06-21 NOTE — Patient Instructions (Addendum)
 Medication Instructions:  Your physician has recommended you make the following change in your medication:  Please switch Aldactone to in the morning   Labwork: In 1-2 weeks at [facility]  Testing/Procedures: None   Follow-Up: Your physician recommends that you schedule a follow-up appointment in: 2-3 months   Any Other Special Instructions Will Be Listed Below (If Applicable).  If you need a refill on your cardiac medications before your next appointment, please call your pharmacy.

## 2023-06-21 NOTE — Progress Notes (Signed)
 Cardiology Office Note:  .   Date:  06/21/2023 ID:  Terry Hanson, DOB [birth year], MRN [id number] PCP: Anabel Halon, MD  [facility] Providers Cardiologist:  Marjo Bicker, MD Electrophysiologist:  Lewayne Bunting, MD    History of Present Illness: .   Terry Hanson is a 83 y.o. female with a PMH of  CAD, s/p STEMI in 2021, atrial fibrillation, s/p ablation in 2023, chronic systolic CHF, s/p ICD, history of ischemic cardiomyopathy, type 2 diabetes, hypercholesterolemia, stage III CKD, history of V-fib/V. tach, and history of acute respiratory failure with hypercapnia, who presents today for hospital follow-up.   History of anterior STEMI in 2021, received PCI to LAD at the time.  Last seen by Dr. Jenene Slicker on February 11, 2023.   Had 2 hospitalizations prior to office visit, 1 was due to ICD shock from VT that was felt to be due to electrolyte abnormalities, L HC was deferred.  Noted to be anemic with hemoglobin around 6, received blood transfusion.  Also was hospitalized after a fall, denied any recurrent falls at last office visit in November after using walker.  Was doing well at the time of office visit.  Hospitalized in January 2025 due to NSTEM and a flutter with RVR.  EMS reported that she been in SVT with heart rate 185 upon arrival, converted to A-fib prior to arriving to ED.  She reported dizziness and funny feeling in chest.  She noted feeling chest pressure at that time after going to the restroom, could feel palpitations.  She had been without digoxin as this was not in stock at her pharmacy, had restarted at least 2 to 3 days prior to admission.  Presented with NSTEMI and initial heart cath showed patent proximal stent with no significant restenosis, due to access issues at the time, no PCI.  See full report below.  Was brought back to the Cath Lab and treated with PTCA/DES of RCA.  Also treated with PTCA/DES of mid left circumflex.  Minimal 30% stenosis in distal left  circumflex that was distal to stent, recommended be treated medically.  Recommended to triple therapy with aspirin, Plavix, and Eliquis for 30 days.  After 1 month, aspirin was recommended be discontinued and would remain on Plavix and Eliquis for up to 1 year.  She was switched to 80 mg of Lipitor.  Recommended to consider obtaining FLP at outpatient follow-up, and stated may benefit from lipid clinic referral.  Updated EF 25 to 30%.  GDMT limited, however was started on Lasix 20 mg daily, spironolactone 25 mg daily, recommended repeat BMP.  Did convert from a flutter RVR on admission to sinus rhythm.  Was felt that she is not a candidate for repeat ablation due to her age and obesity.  Has been previously intolerant to amiodarone.  Did revert back to a flutter.  Recommended to focus on rate control.  Today she presents for hospital follow-up with her sister.  Chief concern is fatigue, says she is only getting about 3 hours of sleep per night.  Taking a fluid pill at night, she is unsure which 1.  Does admit to increasing flareup in her asthma due to pollen, she does admit to seasonal allergies.  She reports weight is stable, has noticed more increased leg edema per her report. Denies any chest pain, palpitations, syncope, presyncope, dizziness, orthopnea, PND, significant weight changes, acute bleeding, or claudication.  ROS: Negative. See HPI.   Studies Reviewed: .  EKG: EKG Interpretation Date/Time:  Friday June 21 2023 10:29:05 EDT Ventricular Rate:  64 PR Interval:  138 QRS Duration:  92 QT Interval:  392 QTC Calculation: 404 R Axis:   95  Text Interpretation: Normal sinus rhythm Rightward axis Low voltage QRS ST & T wave abnormality, consider inferior ischemia When compared with ECG of 05-Apr-2023 06:46, QRS axis Shifted right Confirmed by Sharlene Dory [phone]) on 06/21/2023 10:47:13 AM    Echo 03/2023:   1. Septal, apical and inferior wall hypokinesis . Left ventricular  ejection  fraction, by estimation, is 25 to 30%. The left ventricle has  severely decreased function. The left ventricle demonstrates regional wall  motion abnormalities (see scoring  diagram/findings for description). The left ventricular internal cavity  size was moderately dilated. Left ventricular diastolic parameters are  consistent with Grade I diastolic dysfunction (impaired relaxation).  Elevated left ventricular end-diastolic  pressure.   2. Device leads in RA/RV. Right ventricular systolic function is normal.  The right ventricular size is normal. There is moderately elevated  pulmonary artery systolic pressure.   3. Left atrial size was moderately dilated.   4. The mitral valve is abnormal. Mild mitral valve regurgitation. No  evidence of mitral stenosis.   5. The aortic valve is tricuspid. There is moderate calcification of the  aortic valve. Aortic valve regurgitation is not visualized. Mild aortic  valve stenosis.   6. The inferior vena cava is dilated in size with <50% respiratory  variability, suggesting right atrial pressure of 15 mmHg.   Coronary stent intervention 03/2023:  Coronary intervention 04/04/2023: (Please see detailed diagnostic angiogram report 04/03/2023) Successful percutaneous coronary intervention prox-mid RCA & mid LCx        PTCA and stent placement 3.5 X 20 mm Synergy drug-eluting stent mid RCA          Post dilatation using 3.75 X 15 mm [place] balloon up to 18 atm        PTCA and stent placement 2.5 X 32 mm Synergy drug-eluting stent mid LCx          Post dilatation using 2.75 X 15 mm [place] balloon up to 18 atm   No residual stenosis within the stented segments.   Minimal residual 30% stenosis distal to the left circumflex stent.        Consider triple therapy with aspirin, Plavix, Eliquis for 1 month. Then, stop aspirin and continue Plavix and Eliquis for up to 1 year.   Contrast used: 85 cc Will hydrate the patient with 100 cc/hr for next 8 hours.   LHC  03/2023:  Coronary angiography 04/03/2023: LM: Normal LAD: Patent prox stent, no significant restenosis          40% mid LAD stenosis Lcx: Mid 80%, and 60% tandem stenoses on either side of OM with 80% ostial stenosis RCA: Proximal/mid eccentric 90% stenosis   LVEDP 6 mmHg      Intervention not attempted today due to access related issues. Patient hemodynamically stable.  No large hematoma at access site.   Will attempt intervention through left radial access tomorrow.   Elder Negus, MD  Carotid duplex 11/2022:  IMPRESSION: 1. Mild to moderate calcified plaque at the level of both carotid bulbs and proximal internal carotid arteries. Estimated bilateral ICA stenoses are less than 50%. 2. Retrograde flow in the sampled portion of the left vertebral artery in the neck. 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  Physical Exam:   VS:  BP 136/60 (BP Location: Right Arm, Cuff Size: Normal)   Pulse 63   Ht 5\' 2"  (1.575 m)   Wt 181 lb 9.6 oz (82.4 kg)   SpO2 97%   BMI 33.22 kg/m    Wt Readings from Last 3 Encounters:  06/21/23 181 lb 9.6 oz (82.4 kg)  05/16/23 188 lb 3.2 oz (85.4 kg)  04/03/23 190 lb 4.1 oz (86.3 kg)    GEN: Obese, 83 y.o. female in no acute distress, appears fatigued NECK: No JVD; No carotid bruits CARDIAC: S1/S2, RRR, no murmurs, rubs, gallops RESPIRATORY:  Clear and diminished to auscultation without rales, wheezing or rhonchi  ABDOMEN: Soft, non-tender, non-distended EXTREMITIES:  1-2+ edema to BLE; No deformity   ASSESSMENT AND PLAN: .    Chronic systolic CHF, leg edema Stage C, NYHA class II-III symptoms. EF 25-30% in January 2025, Grade 1 DD.  History of ischemic cardiomyopathy.  Does have some noted leg edema on exam.  Instructed her to take spironolactone in the morning instead of the evening to prevent loss of sleep.  Will write Rx for compression  stockings.  Continue digoxin, Lasix, Toprol-XL, and Aldactone. GDMT limited d/t CKD. Low sodium diet, fluid restriction <2L, and daily weights encouraged. Educated to contact our office for weight gain of 2 lbs overnight or 5 lbs in one week.  CAD Hx of anterior STEMI in 2021, received PCI to LAD at the time. Recent hospitalization in 03/2023 with NSTEMI, underwent left heart cath and successful percutaneous coronary intervention prox-mid RCA & mid Lcx.  She completed triple therapy for 1 month.  She continues on Plavix and Eliquis.  Was recommended to continue Plavix and Eliquis for up to 1 year.  Denies any bleeding issues.  Will be getting labs as mentioned below. No other medication changes at this time. She denies any chest pain. Heart healthy diet and regular cardiovascular exercise encouraged. Care and ED precautions discussed.   A-fib/A-flutter, SVT Denies any tachycardia or palpitations.  She is sinus rhythm on exam today.  Recent hospital course noted by SVT upon arrival to ED, hospital course also noted by paroxysmal A-flutter and A-fib. Seen by cardiology. Was felt that she is not a candidate for repeat ablation due to her age and obesity.  Has been previously intolerant to amiodarone.  Recommended to focus on rate control.  No medication changes at this time.  Will be getting labs.  4. Mild aortic valve stenosis Echocardiogram 1/25 show mild aortic valve stenosis.  Denies any concerning symptoms at the time.  Plan to update echocardiogram in 1 year or sooner if clinically indicated.  5. Hx of Vtach, s/p ICD No recent issues.  Most recent device check revealed no arrhythmias noted.  Normal device function seen.  Continue follow-up with EP as scheduled.  6. Hypercholesterolemia Lipoprotein a in January 2025 was found to be 218.2.  Continue Lipitor 80 mg daily.  At next visit, plan to address getting FLP. Heart healthy diet and regular cardiovascular exercise encouraged.   7. CKD stage  IIIb/IV Most recent labs on file revealed serum creatinine at 1.55 with eGFR 33.  Will obtain labs including CMET.  Avoid nephrotoxic agents.  No medication changes at this time.  Continue follow-up with Dr. Wolfgang Phoenix and PCP.  8. Fatigue, anemia Etiology multifactorial.  Recent loss of sleep due to taking Aldactone in the evenings.  Instructed her to take this in the mornings.  Most recent CBC showed  that hemoglobin was at 9.9.  Will obtain the following labs including: CBC, CMET, magnesium, thyroid panel, iron panel, B12, and folate.   Dispo: Follow-up with me/APP in 2 to 3 months or sooner if any changes.  Signed, Sharlene Dory, NP

## 2023-07-04 ENCOUNTER — Telehealth: Payer: Self-pay | Admitting: Internal Medicine

## 2023-07-04 DIAGNOSIS — I4819 Other persistent atrial fibrillation: Secondary | ICD-10-CM

## 2023-07-04 MED ORDER — APIXABAN 2.5 MG PO TABS: 2.5000 mg | ORAL_TABLET | Freq: Two times a day (BID) | ORAL | 1 refills | Status: DC

## 2023-07-04 MED ORDER — APIXABAN 2.5 MG PO TABS: 2.5000 mg | ORAL_TABLET | Freq: Two times a day (BID) | ORAL | 1 refills | Status: AC

## 2023-07-04 NOTE — Addendum Note (Signed)
 Addended by: Natividad Balding B on: 07/04/2023 01:39 PM   Modules accepted: Orders

## 2023-07-04 NOTE — Telephone Encounter (Signed)
 Pt came in office and stated that she needs an RX for Eliquis sent to [facility] on [address]

## 2023-07-04 NOTE — Telephone Encounter (Signed)
 Prescription refill request for Eliquis received. Indication: Afib  Last office visit: 06/21/23 [name])  Scr: 2.65 (04/05/23)  Age: 83 Weight: 82.4kg  Appropriate dose. Refill sent.

## 2023-07-17 ENCOUNTER — Other Ambulatory Visit: Payer: Self-pay

## 2023-07-17 ENCOUNTER — Telehealth: Payer: Self-pay | Admitting: Internal Medicine

## 2023-07-17 MED ORDER — ATORVASTATIN CALCIUM 80 MG PO TABS: 80.0000 mg | ORAL_TABLET | Freq: Every day | ORAL | 3 refills | Status: AC

## 2023-07-17 NOTE — Telephone Encounter (Signed)
 Rx sent

## 2023-07-17 NOTE — Addendum Note (Signed)
 Addended by: Edra Govern D on: 07/17/2023 03:48 PM   Modules accepted: Orders

## 2023-07-17 NOTE — Progress Notes (Signed)
 Remote ICD transmission.

## 2023-07-17 NOTE — Telephone Encounter (Signed)
 Copied from CRM [id number]. Topic: General - Other >> Jul 17, 2023  9:02 AM Emylou G wrote: Reason for CRM: Rosalee w/[facility] [employer] said patient is behind on atorvastatin  (LIPITOR) 80 MG tablet.Aaron Aas last refill: 1/17 Needs to know if refilling?  Pls confirm.. Pharmacy trying to reach her [phone] (wanted number for patient?)

## 2023-07-18 ENCOUNTER — Other Ambulatory Visit (HOSPITAL_COMMUNITY): Payer: Self-pay

## 2023-07-18 NOTE — Telephone Encounter (Signed)
 Copied from CRM [id number]. Topic: Clinical - Prescription Issue >> Jul 18, 2023 10:05 AM Baldemar Lev wrote: Reason for CRM: Kenj calling from [facility] reports that the patient needs adherence to her   atorvastatin  (LIPITOR) 80 MG tablet  Best contact: [phone] ext. [phone]

## 2023-07-18 NOTE — Telephone Encounter (Signed)
 Attempted to reach [facility] unable to reach a representative.

## 2023-08-06 ENCOUNTER — Ambulatory Visit: Payer: No Typology Code available for payment source

## 2023-08-21 ENCOUNTER — Ambulatory Visit: Payer: No Typology Code available for payment source | Admitting: Internal Medicine

## 2023-08-22 ENCOUNTER — Ambulatory Visit: Attending: Nurse Practitioner | Admitting: Nurse Practitioner

## 2023-08-22 ENCOUNTER — Encounter: Payer: Self-pay | Admitting: Nurse Practitioner

## 2023-08-22 VITALS — BP 118/72 | HR 72 | Ht 62.0 in | Wt 174.0 lb

## 2023-08-22 DIAGNOSIS — N1832 Chronic kidney disease, stage 3b: Secondary | ICD-10-CM | POA: Diagnosis not present

## 2023-08-22 DIAGNOSIS — Z95810 Presence of automatic (implantable) cardiac defibrillator: Secondary | ICD-10-CM | POA: Diagnosis not present

## 2023-08-22 DIAGNOSIS — E7800 Pure hypercholesterolemia, unspecified: Secondary | ICD-10-CM

## 2023-08-22 DIAGNOSIS — I5022 Chronic systolic (congestive) heart failure: Secondary | ICD-10-CM | POA: Diagnosis not present

## 2023-08-22 DIAGNOSIS — R5383 Other fatigue: Secondary | ICD-10-CM

## 2023-08-22 DIAGNOSIS — Z79899 Other long term (current) drug therapy: Secondary | ICD-10-CM

## 2023-08-22 DIAGNOSIS — I4891 Unspecified atrial fibrillation: Secondary | ICD-10-CM

## 2023-08-22 DIAGNOSIS — I2510 Atherosclerotic heart disease of native coronary artery without angina pectoris: Secondary | ICD-10-CM | POA: Diagnosis not present

## 2023-08-22 DIAGNOSIS — D649 Anemia, unspecified: Secondary | ICD-10-CM

## 2023-08-22 DIAGNOSIS — I471 Supraventricular tachycardia, unspecified: Secondary | ICD-10-CM | POA: Diagnosis not present

## 2023-08-22 DIAGNOSIS — I4892 Unspecified atrial flutter: Secondary | ICD-10-CM | POA: Diagnosis not present

## 2023-08-22 DIAGNOSIS — I350 Nonrheumatic aortic (valve) stenosis: Secondary | ICD-10-CM

## 2023-08-22 DIAGNOSIS — I4819 Other persistent atrial fibrillation: Secondary | ICD-10-CM

## 2023-08-22 NOTE — Patient Instructions (Addendum)
 Medication Instructions:   Continue all current medications.   Labwork:  Thyroid  panel, B12, Folate, CBC, Iron, Mg, CMET, BNP, FLP - orders given today Reminder:  Nothing to eat or drink after 12 midnight prior to labs. Office will contact with results via phone, letter or mychart.    PLEASE DO IN 2-3 WEEKS   Testing/Procedures:  none  Follow-Up:  3-4 months   Any Other Special Instructions Will Be Listed Below (If Applicable).   If you need a refill on your cardiac medications before your next appointment, please call your pharmacy. ]

## 2023-08-22 NOTE — Progress Notes (Signed)
 Cardiology Office Note:  .   Date:  08/22/2023 ID:  Terry Hanson, DOB 01-26-1941, MRN [id number] PCP: Meldon Sport, MD  [facility] Providers Cardiologist:  Lasalle Pointer, MD Electrophysiologist:  Manya Sells, MD    History of Present Illness: .   Terry Hanson is a 83 y.o. female with a PMH of  CAD, s/p STEMI in 2021, atrial fibrillation, s/p ablation in 2023, chronic systolic CHF, s/p ICD, history of ischemic cardiomyopathy, type 2 diabetes, hypercholesterolemia, stage III CKD, history of V-fib/V. tach, and history of acute respiratory failure with hypercapnia, who presents today for hospital follow-up.   History of anterior STEMI in 2021, received PCI to LAD at the time.  Last seen by Dr. Mallipeddi on February 11, 2023.   Had 2 hospitalizations prior to office visit, 1 was due to ICD shock from VT that was felt to be due to electrolyte abnormalities, L HC was deferred.  Noted to be anemic with hemoglobin around 6, received blood transfusion.  Also was hospitalized after a fall, denied any recurrent falls at last office visit in November after using walker.  Was doing well at the time of office visit.  Hospitalized in January 2025 due to NSTEM and a flutter with RVR.  EMS reported that she been in SVT with heart rate 185 upon arrival, converted to A-fib prior to arriving to ED.  She reported dizziness and funny feeling in chest.  She noted feeling chest pressure at that time after going to the restroom, could feel palpitations.  She had been without digoxin  as this was not in stock at her pharmacy, had restarted at least 2 to 3 days prior to admission.  Presented with NSTEMI and initial heart cath showed patent proximal stent with no significant restenosis, due to access issues at the time, no PCI.  See full report below.  Was brought back to the Cath Lab and treated with PTCA/DES of RCA.  Also treated with PTCA/DES of mid left circumflex.  Minimal 30% stenosis in distal left  circumflex that was distal to stent, recommended be treated medically.  Recommended to triple therapy with aspirin , Plavix , and Eliquis  for 30 days.  After 1 month, aspirin  was recommended be discontinued and would remain on Plavix  and Eliquis  for up to 1 year.  She was switched to 80 mg of Lipitor.  Recommended to consider obtaining FLP at outpatient follow-up, and stated may benefit from lipid clinic referral.  Updated EF 25 to 30%.  GDMT limited, however was started on Lasix  20 mg daily, spironolactone  25 mg daily, recommended repeat BMP.  Did convert from a flutter RVR on admission to sinus rhythm.  Was felt that she is not a candidate for repeat ablation due to her age and obesity.  Has been previously intolerant to amiodarone .  Did revert back to a flutter.  Recommended to focus on rate control.  06/21/2023 - Today she presents for hospital follow-up with her sister.  Chief concern is fatigue, says she is only getting about 3 hours of sleep per night.  Taking a fluid pill at night, she is unsure which 1.  Does admit to increasing flareup in her asthma due to pollen, she does admit to seasonal allergies.  She reports weight is stable, has noticed more increased leg edema per her report. Denies any chest pain, palpitations, syncope, presyncope, dizziness, orthopnea, PND, significant weight changes, acute bleeding, or claudication.  08/22/2023 -  Here for follow-up. Continues to note fatigue.  Denies any chest pain, shortness of breath, palpitations, syncope, presyncope, dizziness, orthopnea, PND, swelling or significant weight changes, acute bleeding, or claudication. She has lost weight - appetite is poor.   ROS: Negative. See HPI.   Studies Reviewed: Aaron Aas    EKG: EKG is not ordered today.       Echo 03/2023:   1. Septal, apical and inferior wall hypokinesis . Left ventricular  ejection fraction, by estimation, is 25 to 30%. The left ventricle has  severely decreased function. The left ventricle  demonstrates regional wall  motion abnormalities (see scoring  diagram/findings for description). The left ventricular internal cavity  size was moderately dilated. Left ventricular diastolic parameters are  consistent with Grade I diastolic dysfunction (impaired relaxation).  Elevated left ventricular end-diastolic  pressure.   2. Device leads in RA/RV. Right ventricular systolic function is normal.  The right ventricular size is normal. There is moderately elevated  pulmonary artery systolic pressure.   3. Left atrial size was moderately dilated.   4. The mitral valve is abnormal. Mild mitral valve regurgitation. No  evidence of mitral stenosis.   5. The aortic valve is tricuspid. There is moderate calcification of the  aortic valve. Aortic valve regurgitation is not visualized. Mild aortic  valve stenosis.   6. The inferior vena cava is dilated in size with <50% respiratory  variability, suggesting right atrial pressure of 15 mmHg.   Coronary stent intervention 03/2023:  Coronary intervention 04/04/2023: (Please see detailed diagnostic angiogram report 04/03/2023) Successful percutaneous coronary intervention prox-mid RCA & mid LCx        PTCA and stent placement 3.5 X 20 mm Synergy drug-eluting stent mid RCA          Post dilatation using 3.75 X 15 mm [place] balloon up to 18 atm        PTCA and stent placement 2.5 X 32 mm Synergy drug-eluting stent mid LCx          Post dilatation using 2.75 X 15 mm [place] balloon up to 18 atm   No residual stenosis within the stented segments.   Minimal residual 30% stenosis distal to the left circumflex stent.        Consider triple therapy with aspirin , Plavix , Eliquis  for 1 month. Then, stop aspirin  and continue Plavix  and Eliquis  for up to 1 year.   Contrast used: 85 cc Will hydrate the patient with 100 cc/hr for next 8 hours.   LHC 03/2023:  Coronary angiography 04/03/2023: LM: Normal LAD: Patent prox stent, no significant restenosis           40% mid LAD stenosis Lcx: Mid 80%, and 60% tandem stenoses on either side of OM with 80% ostial stenosis RCA: Proximal/mid eccentric 90% stenosis   LVEDP 6 mmHg      Intervention not attempted today due to access related issues. Patient hemodynamically stable.  No large hematoma at access site.   Will attempt intervention through left radial access tomorrow.   Cody Das, MD  Carotid duplex 11/2022:  IMPRESSION: 1. Mild to moderate calcified plaque at the level of both carotid bulbs and proximal internal carotid arteries. Estimated bilateral ICA stenoses are less than 50%. 2. Retrograde flow in the sampled portion of the left vertebral artery in the neck. 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  Physical Exam:   VS:  BP 118/72   Pulse 72   Ht 5\' 2"  (1.575 m)  Wt 174 lb (78.9 kg)   SpO2 99%   BMI 31.83 kg/m    Wt Readings from Last 3 Encounters:  08/22/23 174 lb (78.9 kg)  06/21/23 181 lb 9.6 oz (82.4 kg)  05/16/23 188 lb 3.2 oz (85.4 kg)    GEN: Obese, 83 y.o. female in no acute distress, appears fatigued NECK: No JVD; No carotid bruits CARDIAC: S1/S2, RRR, no murmurs, rubs, gallops RESPIRATORY:  Clear and diminished to auscultation without rales, wheezing or rhonchi  ABDOMEN: Soft, non-tender, non-distended EXTREMITIES:  No edema to BLE; No deformity   ASSESSMENT AND PLAN: .    Chronic systolic CHF Stage C, NYHA class II-III symptoms. EF 25-30% in January 2025, Grade 1 DD.  History of ischemic cardiomyopathy.  Continue current medication regimen. GDMT limited d/t CKD. Low sodium diet, fluid restriction <2L, and daily weights encouraged. Educated to contact our office for weight gain of 2 lbs overnight or 5 lbs in one week.  CAD Hx of anterior STEMI in 2021, received PCI to LAD at the time. Recent hospitalization in 03/2023 with NSTEMI, underwent left heart cath and  successful percutaneous coronary intervention prox-mid RCA & mid Lcx.  She completed triple therapy for 1 month.  She continues on Plavix  and Eliquis .  Was recommended to continue Plavix  and Eliquis  for up to 1 year.  Denies any bleeding issues.  Will be getting labs as mentioned below. No medication changes at this time. She denies any chest pain. Heart healthy diet and regular cardiovascular exercise encouraged. Care and ED precautions discussed.   A-fib/A-flutter, SVT Denies any tachycardia or palpitations.  Recent hospital course noted by SVT upon arrival to ED, hospital course also noted by paroxysmal A-flutter and A-fib. Seen by cardiology. Was felt that she is not a candidate for repeat ablation due to her age and obesity.  Has been previously intolerant to amiodarone .  Recommended to focus on rate control.  No medication changes at this time.  Will be getting labs.  4. Mild aortic valve stenosis Echocardiogram 03/2023 show mild aortic valve stenosis.  Denies any concerning symptoms at the time.  Plan to update echocardiogram in 1 year or sooner if clinically indicated.  5. Hx of Vtach, s/p ICD No recent issues.  Most recent device check revealed no arrhythmias noted.  Normal device function seen.  Continue follow-up with EP as scheduled.  6. Hypercholesterolemia Lipoprotein a in January 2025 was found to be 218.2.  Continue Lipitor 80 mg daily. Will obtain FLP in addition to labs as mentioned below. Heart healthy diet and regular cardiovascular exercise encouraged.   7. CKD stage IIIb/IV Most recent labs on file revealed serum creatinine at 1.55 with eGFR 33.  Will obtain labs including CMET.  Avoid nephrotoxic agents.  No medication changes at this time.  Continue follow-up with Dr. Carrolyn Clan and PCP.  8. Fatigue, anemia, medication management Etiology multifactorial.  Recent loss of sleep due to taking Aldactone  in the evenings. Continues to notice fatigue after taking medication in the  morning. Will obtain the following labs including: CBC, CMET, magnesium , thyroid  panel, iron panel, B12, and folate.    Dispo: Follow-up with me/APP in 3-4 months or sooner if any changes.  Signed, Lasalle Pointer, NP

## 2023-08-27 ENCOUNTER — Telehealth: Payer: Self-pay | Admitting: Internal Medicine

## 2023-08-27 NOTE — Telephone Encounter (Signed)
 Copied from CRM [id number]. Topic: Medical Record Request - Other >> Aug 26, 2023  5:09 PM Stanly Early wrote: Reason for CRM: Caller would like to confirm if the patient has any of the following conditions.diabetes,heart failure and cardio vascular disease.   Callback:[phone] Reference EA:[id number]

## 2023-08-28 NOTE — Telephone Encounter (Signed)
Message resolved.

## 2023-08-28 NOTE — Telephone Encounter (Signed)
 Attempted to return call office is closed will try  at a later time.

## 2023-09-02 ENCOUNTER — Ambulatory Visit: Payer: No Typology Code available for payment source

## 2023-09-09 ENCOUNTER — Ambulatory Visit (INDEPENDENT_AMBULATORY_CARE_PROVIDER_SITE_OTHER): Payer: Self-pay

## 2023-09-09 DIAGNOSIS — I5022 Chronic systolic (congestive) heart failure: Secondary | ICD-10-CM | POA: Diagnosis not present

## 2023-09-09 DIAGNOSIS — I4891 Unspecified atrial fibrillation: Secondary | ICD-10-CM

## 2023-09-10 LAB — CUP PACEART REMOTE DEVICE CHECK
Battery Remaining Longevity: 80 mo
Battery Remaining Percentage: 66 %
Battery Voltage: 2.98 V
Brady Statistic RV Percent Paced: 1 %
Date Time Interrogation Session: 20250622095216
HighPow Impedance: 70 Ohm
Implantable Lead Connection Status: 753985
Implantable Lead Implant Date: 20210927
Implantable Lead Location: 753860
Implantable Pulse Generator Implant Date: 20210927
Lead Channel Impedance Value: 380 Ohm
Lead Channel Pacing Threshold Amplitude: 0.75 V
Lead Channel Pacing Threshold Pulse Width: 0.5 ms
Lead Channel Sensing Intrinsic Amplitude: 11.6 mV
Lead Channel Setting Pacing Amplitude: 2.5 V
Lead Channel Setting Pacing Pulse Width: 0.5 ms
Lead Channel Setting Sensing Sensitivity: 0.5 mV
Pulse Gen Serial Number: 810006603
Zone Setting Status: 755011

## 2023-09-15 ENCOUNTER — Ambulatory Visit: Payer: Self-pay | Admitting: Internal Medicine

## 2023-10-14 NOTE — Addendum Note (Signed)
 Addended by: TAWNI DRILLING D on: 10/14/2023 09:51 AM   Modules accepted: Orders

## 2023-10-14 NOTE — Progress Notes (Signed)
 Remote ICD transmission.

## 2023-11-12 DIAGNOSIS — Z79899 Other long term (current) drug therapy: Secondary | ICD-10-CM | POA: Diagnosis not present

## 2023-11-12 DIAGNOSIS — I4819 Other persistent atrial fibrillation: Secondary | ICD-10-CM | POA: Diagnosis not present

## 2023-11-12 DIAGNOSIS — E7800 Pure hypercholesterolemia, unspecified: Secondary | ICD-10-CM | POA: Diagnosis not present

## 2023-11-12 DIAGNOSIS — R5383 Other fatigue: Secondary | ICD-10-CM | POA: Diagnosis not present

## 2023-11-12 DIAGNOSIS — I5022 Chronic systolic (congestive) heart failure: Secondary | ICD-10-CM | POA: Diagnosis not present

## 2023-11-13 ENCOUNTER — Ambulatory Visit: Payer: Self-pay | Admitting: Internal Medicine

## 2023-11-14 LAB — CBC
Hematocrit: 35.3 % (ref 34.0–46.6)
Hemoglobin: 11.4 g/dL (ref 11.1–15.9)
MCH: 29.7 pg (ref 26.6–33.0)
MCHC: 32.3 g/dL (ref 31.5–35.7)
MCV: 92 fL (ref 79–97)
Platelets: 214 x10E3/uL (ref 150–450)
RBC: 3.84 x10E6/uL (ref 3.77–5.28)
RDW: 14.5 % (ref 11.7–15.4)
WBC: 9 x10E3/uL (ref 3.4–10.8)

## 2023-11-14 LAB — BRAIN NATRIURETIC PEPTIDE: BNP: 638.3 pg/mL — AB (ref 0.0–100.0)

## 2023-11-14 LAB — LIPID PANEL
Chol/HDL Ratio: 6.2 ratio — ABNORMAL HIGH (ref 0.0–4.4)
Cholesterol, Total: 242 mg/dL — ABNORMAL HIGH (ref 100–199)
HDL: 39 mg/dL — ABNORMAL LOW (ref >39)
LDL Chol Calc (NIH): 178 mg/dL — ABNORMAL HIGH (ref 0–99)
Triglycerides: 137 mg/dL (ref 0–149)
VLDL Cholesterol Cal: 25 mg/dL (ref 5–40)

## 2023-11-14 LAB — VITAMIN B12: Vitamin B-12: 306 pg/mL (ref 232–1245)

## 2023-11-14 LAB — MAGNESIUM: Magnesium: 2.4 mg/dL — ABNORMAL HIGH (ref 1.6–2.3)

## 2023-11-19 ENCOUNTER — Ambulatory Visit: Payer: Self-pay | Admitting: Nurse Practitioner

## 2023-11-26 ENCOUNTER — Encounter: Payer: Self-pay | Admitting: Nurse Practitioner

## 2023-11-26 ENCOUNTER — Ambulatory Visit: Attending: Nurse Practitioner | Admitting: Nurse Practitioner

## 2023-12-02 ENCOUNTER — Ambulatory Visit: Payer: No Typology Code available for payment source

## 2023-12-09 ENCOUNTER — Ambulatory Visit: Payer: Self-pay

## 2024-03-02 ENCOUNTER — Ambulatory Visit: Payer: No Typology Code available for payment source

## 2024-03-09 ENCOUNTER — Ambulatory Visit: Payer: Self-pay

## 2024-06-01 ENCOUNTER — Ambulatory Visit: Payer: No Typology Code available for payment source

## 2024-06-08 ENCOUNTER — Ambulatory Visit: Payer: Self-pay

## 2024-08-31 ENCOUNTER — Ambulatory Visit: Payer: No Typology Code available for payment source

## 2024-09-07 ENCOUNTER — Ambulatory Visit: Payer: Self-pay

## 2024-11-30 ENCOUNTER — Ambulatory Visit: Payer: No Typology Code available for payment source

## 2024-12-07 ENCOUNTER — Ambulatory Visit: Payer: Self-pay

## 2025-03-01 ENCOUNTER — Ambulatory Visit: Payer: No Typology Code available for payment source
